# Patient Record
Sex: Female | Born: 1985 | Race: Black or African American | Hispanic: No | Marital: Married | Smoking: Former smoker
Health system: Southern US, Community
[De-identification: ages and names within clinical notes are randomized; demographics above are authoritative.]

## PROBLEM LIST (undated history)

## (undated) ENCOUNTER — Inpatient Hospital Stay (HOSPITAL_COMMUNITY): Payer: Self-pay

## (undated) ENCOUNTER — Emergency Department (HOSPITAL_COMMUNITY): Admission: EM | Payer: Self-pay | Source: Home / Self Care

## (undated) DIAGNOSIS — N898 Other specified noninflammatory disorders of vagina: Secondary | ICD-10-CM

## (undated) DIAGNOSIS — K5900 Constipation, unspecified: Secondary | ICD-10-CM

## (undated) DIAGNOSIS — N939 Abnormal uterine and vaginal bleeding, unspecified: Secondary | ICD-10-CM

## (undated) DIAGNOSIS — J329 Chronic sinusitis, unspecified: Secondary | ICD-10-CM

## (undated) DIAGNOSIS — F99 Mental disorder, not otherwise specified: Secondary | ICD-10-CM

## (undated) DIAGNOSIS — K219 Gastro-esophageal reflux disease without esophagitis: Secondary | ICD-10-CM

## (undated) DIAGNOSIS — D219 Benign neoplasm of connective and other soft tissue, unspecified: Secondary | ICD-10-CM

## (undated) DIAGNOSIS — E079 Disorder of thyroid, unspecified: Secondary | ICD-10-CM

## (undated) DIAGNOSIS — F32A Depression, unspecified: Secondary | ICD-10-CM

## (undated) DIAGNOSIS — A6000 Herpesviral infection of urogenital system, unspecified: Secondary | ICD-10-CM

## (undated) DIAGNOSIS — J45909 Unspecified asthma, uncomplicated: Secondary | ICD-10-CM

## (undated) DIAGNOSIS — F4310 Post-traumatic stress disorder, unspecified: Secondary | ICD-10-CM

## (undated) DIAGNOSIS — F329 Major depressive disorder, single episode, unspecified: Secondary | ICD-10-CM

## (undated) DIAGNOSIS — G4700 Insomnia, unspecified: Secondary | ICD-10-CM

## (undated) DIAGNOSIS — F419 Anxiety disorder, unspecified: Secondary | ICD-10-CM

## (undated) DIAGNOSIS — R1110 Vomiting, unspecified: Secondary | ICD-10-CM

## (undated) DIAGNOSIS — K589 Irritable bowel syndrome without diarrhea: Secondary | ICD-10-CM

## (undated) DIAGNOSIS — Z8619 Personal history of other infectious and parasitic diseases: Secondary | ICD-10-CM

## (undated) DIAGNOSIS — D649 Anemia, unspecified: Secondary | ICD-10-CM

## (undated) HISTORY — DX: Other specified noninflammatory disorders of vagina: N89.8

## (undated) HISTORY — DX: Insomnia, unspecified: G47.00

## (undated) HISTORY — DX: Major depressive disorder, single episode, unspecified: F32.9

## (undated) HISTORY — DX: Post-traumatic stress disorder, unspecified: F43.10

## (undated) HISTORY — DX: Depression, unspecified: F32.A

## (undated) HISTORY — DX: Anxiety disorder, unspecified: F41.9

## (undated) HISTORY — DX: Herpesviral infection of urogenital system, unspecified: A60.00

## (undated) HISTORY — DX: Irritable bowel syndrome, unspecified: K58.9

## (undated) HISTORY — DX: Disorder of thyroid, unspecified: E07.9

## (undated) HISTORY — DX: Personal history of other infectious and parasitic diseases: Z86.19

## (undated) HISTORY — PX: COLONOSCOPY: SHX174

## (undated) HISTORY — DX: Anemia, unspecified: D64.9

## (undated) HISTORY — PX: PERINEUM REPAIR: SHX2219

## (undated) HISTORY — DX: Abnormal uterine and vaginal bleeding, unspecified: N93.9

---

## 2005-10-21 ENCOUNTER — Emergency Department (HOSPITAL_COMMUNITY): Admission: EM | Admit: 2005-10-21 | Discharge: 2005-10-22 | Payer: Self-pay | Admitting: Emergency Medicine

## 2005-11-25 ENCOUNTER — Emergency Department (HOSPITAL_COMMUNITY): Admission: EM | Admit: 2005-11-25 | Discharge: 2005-11-25 | Payer: Self-pay | Admitting: Emergency Medicine

## 2013-04-12 ENCOUNTER — Emergency Department (HOSPITAL_COMMUNITY)
Admission: EM | Admit: 2013-04-12 | Discharge: 2013-04-12 | Disposition: A | Discharge status: Home / Self Care | Payer: Self-pay | Attending: Emergency Medicine | Admitting: Emergency Medicine

## 2013-04-12 ENCOUNTER — Encounter (HOSPITAL_COMMUNITY): Payer: Self-pay | Admitting: Emergency Medicine

## 2013-04-12 DIAGNOSIS — J069 Acute upper respiratory infection, unspecified: Secondary | ICD-10-CM | POA: Insufficient documentation

## 2013-04-12 DIAGNOSIS — B349 Viral infection, unspecified: Secondary | ICD-10-CM

## 2013-04-12 DIAGNOSIS — B9789 Other viral agents as the cause of diseases classified elsewhere: Secondary | ICD-10-CM | POA: Insufficient documentation

## 2013-04-12 HISTORY — DX: Chronic sinusitis, unspecified: J32.9

## 2013-04-12 MED ORDER — IBUPROFEN 800 MG PO TABS: 800.0000 mg | ORAL_TABLET | Freq: Three times a day (TID) | ORAL | Status: DC

## 2013-04-12 MED ORDER — PROMETHAZINE-DM 6.25-15 MG/5ML PO SYRP: 5.0000 mL | ORAL_SOLUTION | Freq: Four times a day (QID) | ORAL | Status: DC | PRN

## 2013-04-12 NOTE — ED Provider Notes (Signed)
CSN: [id number]     Arrival date & time 04/12/13  1709 History   First MD Initiated Contact with Patient 04/12/13 1759     This patient was seen in room APFT21/[facility]21 and the patient's care was started 6:00 PM.   Chief Complaint  Patient presents with  . Fever   Patient is a 27 y.o. female presenting with URI. The history is provided by the patient. No language interpreter was used.  URI Presenting symptoms: congestion, cough, facial pain, fatigue and fever   Presenting symptoms: no ear pain, no rhinorrhea and no sore throat   Congestion:    Location:  Nasal   Interferes with sleep: no     Interferes with eating/drinking: no   Cough:    Cough characteristics:  Productive and non-productive   Sputum characteristics:  Clear   Severity:  Mild   Onset quality:  Gradual   Duration:  3 days   Timing:  Intermittent   Progression:  Unchanged   Chronicity:  New Fever:    Duration:  1 day   Timing:  Unable to specify   Temp source:  Subjective   Progression:  Partially resolved Severity:  Mild Timing:  Intermittent Progression:  Improving Chronicity:  New Relieved by:  Nothing Worsened by:  Nothing tried Ineffective treatments:  OTC medications Associated symptoms: sinus pain   Associated symptoms: no headaches, no neck pain, no swollen glands and no wheezing       Past Medical History  Diagnosis Date  . Sinusitis    History reviewed. No pertinent past surgical history. History reviewed. No pertinent family history. History  Substance Use Topics  . Smoking status: Never Smoker   . Smokeless tobacco: Not on file  . Alcohol Use: No   OB History   Grav Para Term Preterm Abortions TAB SAB Ect Mult Living                 Review of Systems  Constitutional: Positive for fever and fatigue. Negative for chills, activity change and appetite change.  HENT: Positive for congestion. Negative for ear pain, facial swelling, rhinorrhea, sore throat and trouble swallowing.    Eyes: Negative for visual disturbance.  Respiratory: Positive for cough. Negative for chest tightness, shortness of breath, wheezing and stridor.   Gastrointestinal: Negative for nausea, vomiting and abdominal pain.  Genitourinary: Negative for dysuria.  Musculoskeletal: Negative for neck pain and neck stiffness.  Skin: Negative.  Negative for rash.  Neurological: Negative for dizziness, weakness, numbness and headaches.  Hematological: Negative for adenopathy.  Psychiatric/Behavioral: Negative for confusion.  All other systems reviewed and are negative.    Allergies  Review of patient's allergies indicates no known allergies.  Home Medications  No current outpatient prescriptions on file.  Triage Vitals: BP 113/86  Pulse 102  Temp(Src) 98.9 F (37.2 C) (Oral)  Resp 20  Ht 5\' 2"  (1.575 m)  Wt 135 lb (61.236 kg)  BMI 24.69 kg/m2  SpO2 100%  LMP 04/05/2013  Physical Exam  Nursing note and vitals reviewed. Constitutional: She is oriented to person, place, and time. She appears well-developed and well-nourished.  Non-toxic appearance. She does not appear ill. No distress.  HENT:  Head: Normocephalic and atraumatic.  Right Ear: Tympanic membrane, external ear and ear canal normal.  Left Ear: Tympanic membrane, external ear and ear canal normal.  Nose: Mucosal edema and rhinorrhea present.  Mouth/Throat: Uvula is midline, oropharynx is clear and moist and mucous membranes are normal. No trismus in the  jaw. No dental abscesses or uvula swelling. No oropharyngeal exudate, posterior oropharyngeal edema or tonsillar abscesses.  Eyes: Conjunctivae and EOM are normal. Pupils are equal, round, and reactive to light.  Neck: Normal range of motion, full passive range of motion without pain and phonation normal. Neck supple. No Brudzinski's sign and no Kernig's sign noted.  Cardiovascular: Normal rate, regular rhythm, normal heart sounds and intact distal pulses.  Exam reveals no gallop  and no friction rub.   No murmur heard. Pulmonary/Chest: Effort normal and breath sounds normal. No respiratory distress. She has no wheezes. She has no rhonchi. She has no rales. She exhibits no tenderness and no crepitus.  Abdominal: Soft. Normal appearance. She exhibits no distension. There is no tenderness. There is no rebound and no guarding.  Musculoskeletal: Normal range of motion. She exhibits no edema and no tenderness.  Moves all extremities well.   Lymphadenopathy:    She has no cervical adenopathy.  Neurological: She is alert and oriented to person, place, and time. She has normal strength. She exhibits normal muscle tone. Coordination normal.  Skin: Skin is warm, dry and intact. No rash noted. No erythema. No pallor.  Psychiatric: Her speech is normal. Her mood appears not anxious.    ED Course  Procedures (including critical care time)  DIAGNOSTIC STUDIES: Oxygen Saturation is 100% on RA, Normal by my interpretation.    COORDINATION OF CARE: 6:00 PM-Discussed treatment plan with pt at bedside and pt agreed to plan.      Labs Review Labs Reviewed - No data to display Imaging Review No results found.  EKG Interpretation   None       MDM    VSS.  Pt well appearing.  No meningeal signs.  Likely viral illness.  Pt agrees to symptomatic tx with ibuprofen and anti-tussive.  She appears stable for discharge.  Shuntae Herzig L. Trisha Mangle, PA-C 04/14/13 1153

## 2013-04-12 NOTE — ED Notes (Signed)
Cough, stuffy nose since Friday, Fever today. Nausea,

## 2013-04-15 NOTE — ED Provider Notes (Signed)
Medical screening examination/treatment/procedure(s) were performed by non-physician practitioner and as supervising physician I was immediately available for consultation/collaboration.  EKG Interpretation   None       Devoria Albe, MD, Armando Gang   Ward Givens, MD 04/15/13 [phone]

## 2013-05-04 ENCOUNTER — Emergency Department (HOSPITAL_COMMUNITY): Payer: Self-pay

## 2013-05-04 ENCOUNTER — Encounter (HOSPITAL_COMMUNITY): Payer: Self-pay | Admitting: Emergency Medicine

## 2013-05-04 ENCOUNTER — Emergency Department (HOSPITAL_COMMUNITY)
Admission: EM | Admit: 2013-05-04 | Discharge: 2013-05-04 | Disposition: A | Discharge status: Home / Self Care | Payer: Self-pay | Attending: Emergency Medicine | Admitting: Emergency Medicine

## 2013-05-04 DIAGNOSIS — R748 Abnormal levels of other serum enzymes: Secondary | ICD-10-CM | POA: Insufficient documentation

## 2013-05-04 DIAGNOSIS — A088 Other specified intestinal infections: Secondary | ICD-10-CM | POA: Insufficient documentation

## 2013-05-04 DIAGNOSIS — Z8709 Personal history of other diseases of the respiratory system: Secondary | ICD-10-CM | POA: Insufficient documentation

## 2013-05-04 DIAGNOSIS — Z791 Long term (current) use of non-steroidal anti-inflammatories (NSAID): Secondary | ICD-10-CM | POA: Insufficient documentation

## 2013-05-04 DIAGNOSIS — R112 Nausea with vomiting, unspecified: Secondary | ICD-10-CM | POA: Insufficient documentation

## 2013-05-04 DIAGNOSIS — A084 Viral intestinal infection, unspecified: Secondary | ICD-10-CM

## 2013-05-04 DIAGNOSIS — Z3202 Encounter for pregnancy test, result negative: Secondary | ICD-10-CM | POA: Insufficient documentation

## 2013-05-04 LAB — URINE MICROSCOPIC-ADD ON

## 2013-05-04 LAB — COMPREHENSIVE METABOLIC PANEL
ALT: 28 U/L (ref 0–35)
AST: 22 U/L (ref 0–37)
Albumin: 4.1 g/dL (ref 3.5–5.2)
Alkaline Phosphatase: 130 U/L — ABNORMAL HIGH (ref 39–117)
BUN: 9 mg/dL (ref 6–23)
CO2: 26 mEq/L (ref 19–32)
Calcium: 9.3 mg/dL (ref 8.4–10.5)
Chloride: 100 mEq/L (ref 96–112)
Creatinine, Ser: 0.74 mg/dL (ref 0.50–1.10)
GFR calc Af Amer: >90 mL/min (ref >90)
GFR calc non Af Amer: >90 mL/min (ref >90)
Glucose, Bld: 99 mg/dL (ref 70–99)
Potassium: 3.5 mEq/L (ref 3.5–5.1)
Sodium: 137 mEq/L (ref 135–145)
Total Bilirubin: 0.6 mg/dL (ref 0.3–1.2)
Total Protein: 8.3 g/dL (ref 6.0–8.3)

## 2013-05-04 LAB — CBC
HCT: 38.4 % (ref 36.0–46.0)
MCHC: 31.8 g/dL (ref 30.0–36.0)
Platelets: 227 10*3/uL (ref 150–400)
RBC: 5.27 MIL/uL — ABNORMAL HIGH (ref 3.87–5.11)
RDW: 16 % — ABNORMAL HIGH (ref 11.5–15.5)
WBC: 2.8 10*3/uL — ABNORMAL LOW (ref 4.0–10.5)

## 2013-05-04 LAB — URINALYSIS, ROUTINE W REFLEX MICROSCOPIC
Leukocytes, UA: NEGATIVE
Nitrite: NEGATIVE
Protein, ur: NEGATIVE mg/dL
Specific Gravity, Urine: >1.03 — ABNORMAL HIGH (ref 1.005–1.030)
Urobilinogen, UA: 1 mg/dL (ref 0.0–1.0)
pH: 6 (ref 5.0–8.0)

## 2013-05-04 LAB — LIPASE, BLOOD: Lipase: 26 U/L (ref 11–59)

## 2013-05-04 LAB — POCT PREGNANCY, URINE: Preg Test, Ur: NEGATIVE

## 2013-05-04 MED ORDER — ONDANSETRON HCL 4 MG/2ML IJ SOLN
4.0000 mg | Freq: Once | INTRAMUSCULAR | Status: AC
  Administered 2013-05-04: 4 mg via INTRAVENOUS
  Filled 2013-05-04: qty 2

## 2013-05-04 MED ORDER — SODIUM CHLORIDE 0.9 % IV BOLUS (SEPSIS)
500.0000 mL | Freq: Once | INTRAVENOUS | Status: AC
  Administered 2013-05-04: 10:00:00 via INTRAVENOUS

## 2013-05-04 NOTE — ED Notes (Signed)
Pt given sprite to drink. Resting in bed, no further vomiting noted.

## 2013-05-04 NOTE — ED Provider Notes (Signed)
Medical screening examination/treatment/procedure(s) were performed by non-physician practitioner and as supervising physician I was immediately available for consultation/collaboration.  EKG Interpretation   None         Layla Maw Kenisha Lynds, DO 05/04/13 1404

## 2013-05-04 NOTE — ED Notes (Signed)
Pt reports nausea, vomiting and diarrhea since yesterday. Denies any fever or chills. No ab pain.

## 2013-05-04 NOTE — ED Provider Notes (Signed)
CSN: [id number]     Arrival date & time 05/04/13  4098 History   First MD Initiated Contact with Patient 05/04/13 [phone]     Chief Complaint  Patient presents with  . Emesis   (Consider location/radiation/quality/duration/timing/severity/associated sxs/prior Treatment) HPI Comments: Pt is a 27 y/o healthy female who presents to the ED complaining of nausea, vomiting and diarrhea x 1 day. Pt states shortly after eating breakfast yesterday morning (pancakes and eggs) her symptoms began. States she has had multiple episodes of nonbloody, nonbilious vomiting along with nonbloody, non-watery diarrhea every 2 hours. Yesterday for dinner she had meatballs and beans, her daughter ate the same and is feeling fine. Her daughter did not have pancakes and eggs yesterday morning. 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 Denies abdominal pain, fever, chills, increased urinary frequency, urgency or dysuria, vaginal discharge. She is currently on her menses which began two days ago.  Patient is a 27 y.o. female presenting with vomiting. The history is provided by the patient.  Emesis Associated symptoms: diarrhea     Past Medical History  Diagnosis Date  . Sinusitis    History reviewed. No pertinent past surgical history. No family history on file. History  Substance Use Topics  . Smoking status: Never Smoker   . Smokeless tobacco: Not on file  . Alcohol Use: No   OB History   Grav Para Term Preterm Abortions TAB SAB Ect Mult Living                 Review of Systems  Gastrointestinal: Positive for nausea, vomiting and diarrhea.  All other systems reviewed and are negative.    Allergies  Bee venom  Home Medications   Current Outpatient Rx  Name  Route  Sig  Dispense  Refill  . ibuprofen (ADVIL,MOTRIN) 800 MG tablet   Oral   Take 1 tablet (800 mg total) by mouth 3 (three) times daily.   21 tablet   0   .  promethazine-dextromethorphan (PROMETHAZINE-DM) 6.25-15 MG/5ML syrup   Oral   Take 5 mLs by mouth 4 (four) times daily as needed for cough.   120 mL   0   . Pseudoephedrine-APAP-DM 30-325-15 MG/15ML LIQD   Oral   Take 30 mLs by mouth every 4 (four) hours as needed (for cold and cough).          BP 112/79  Pulse 76  Temp(Src) 98 F (36.7 C) (Oral)  Resp 16  SpO2 98%  LMP 05/02/2013 Physical Exam  Nursing note and vitals reviewed. Constitutional: She is oriented to person, place, and time. She appears well-developed and well-nourished. No distress.  HENT:  Head: Normocephalic and atraumatic.  Mouth/Throat: Oropharynx is clear and moist.  Eyes: Conjunctivae are normal. No scleral icterus.  Neck: Normal range of motion. Neck supple.  Cardiovascular: Normal rate, regular rhythm and normal heart sounds.   Pulmonary/Chest: Effort normal and breath sounds normal.  Abdominal: Soft. Normal appearance and bowel sounds are normal. She exhibits no distension. There is generalized tenderness (worse mid-epigastric and RUQ). There is no rigidity, no rebound, no guarding and negative Murphy's sign.  No peritoneal signs.  Musculoskeletal: Normal range of motion. She exhibits no edema.  Neurological: She is alert and oriented to person, place, and time.  Skin: Skin is warm and dry. She is not diaphoretic.  Psychiatric: She has a normal mood and affect. Her behavior is normal.    ED Course  Procedures (including critical care time) Labs Review Labs Reviewed  CBC - Abnormal; Notable for the following:    WBC 2.8 (*)    RBC 5.27 (*)    MCV 72.9 (*)    MCH 23.1 (*)    RDW 16.0 (*)    All other components within normal limits  COMPREHENSIVE METABOLIC PANEL - Abnormal; Notable for the following:    Alkaline Phosphatase 130 (*)    All other components within normal limits  URINALYSIS, ROUTINE W REFLEX MICROSCOPIC - Abnormal; Notable for the following:    Specific Gravity, Urine >1.030 (*)     Hgb urine dipstick MODERATE (*)    Ketones, ur TRACE (*)    All other components within normal limits  URINE MICROSCOPIC-ADD ON - Abnormal; Notable for the following:    Squamous Epithelial / LPF FEW (*)    All other components within normal limits  LIPASE, BLOOD  POCT PREGNANCY, URINE   Imaging Review US Abdomen Complete  05/04/2013   CLINICAL DATA:  Emesis  EXAM: ULTRASOUND ABDOMEN COMPLETE  COMPARISON:  None.  FINDINGS: Gallbladder:  No gallstones or wall thickening visualized. No sonographic Murphy sign noted.  Common bile duct:  Diameter: 1.5 mm.  Liver:  No focal lesion identified. Within normal limits in parenchymal echogenicity.  IVC:  No abnormality visualized.  Pancreas:  Visualized portion unremarkable.  Spleen:  Size and appearance within normal limits.  Right Kidney:  Length: 1.1 cm. Echogenicity within normal limits. No mass or hydronephrosis visualized.  Left Kidney:  Length: 11.2 cm. Echogenicity within normal limits. No mass or hydronephrosis visualized.  Abdominal aorta:  No aneurysm visualized.  Other findings:  None.  IMPRESSION: Normal abdominal ultrasound.   Electronically Signed   By: Elige Ko   On: 05/04/2013 10:37    EKG Interpretation   None       MDM   1. Viral gastroenteritis   2. Nausea and vomiting   3. Elevated alkaline phosphatase level     Pt presenting with n/v/d. She is well appearing and in NAD, afebrile with normal VS. Generalized abdominal tenderness, more so mid-epigastric, RUQ. Labs pending- cbc, cmp, lipase, ua, preg. IV fluids, zofran. Plan to give PO challenge, probable viral gastroenteritis.  Alk-phos elevated at 130, no old to compare. Given RUQ and mid-epigastric tenderness, will obtain abdominal US to further evaluate gallbladder. 11:03 AM Ultrasound normal. She reports she is feeling much better after receiving fluids and Zofran. Tolerating PO in the emergency department. Regarding leukopenia and elevated alkaline phosphatase,  advised her to followup with PCP for repeat levels. She is stable for discharge. Return precautions given. Patient states understanding of plan and is agreeable.  Charlotte Mace, PA-C 05/04/13 1104

## 2013-05-04 NOTE — ED Notes (Signed)
N/v/d starting yesterday.  Denies abd pain.

## 2013-05-26 ENCOUNTER — Emergency Department (HOSPITAL_COMMUNITY)
Admission: EM | Admit: 2013-05-26 | Discharge: 2013-05-26 | Disposition: A | Discharge status: Home / Self Care | Payer: Self-pay | Attending: Emergency Medicine | Admitting: Emergency Medicine

## 2013-05-26 ENCOUNTER — Encounter (HOSPITAL_COMMUNITY): Payer: Self-pay | Admitting: Emergency Medicine

## 2013-05-26 DIAGNOSIS — Z792 Long term (current) use of antibiotics: Secondary | ICD-10-CM | POA: Insufficient documentation

## 2013-05-26 DIAGNOSIS — K0889 Other specified disorders of teeth and supporting structures: Secondary | ICD-10-CM

## 2013-05-26 DIAGNOSIS — K089 Disorder of teeth and supporting structures, unspecified: Secondary | ICD-10-CM | POA: Insufficient documentation

## 2013-05-26 DIAGNOSIS — Z8709 Personal history of other diseases of the respiratory system: Secondary | ICD-10-CM | POA: Insufficient documentation

## 2013-05-26 MED ORDER — PENICILLIN V POTASSIUM 250 MG PO TABS
500.0000 mg | ORAL_TABLET | Freq: Once | ORAL | Status: AC
  Administered 2013-05-26: 500 mg via ORAL
  Filled 2013-05-26: qty 2

## 2013-05-26 MED ORDER — IBUPROFEN 800 MG PO TABS
800.0000 mg | ORAL_TABLET | Freq: Once | ORAL | Status: AC
  Administered 2013-05-26: 800 mg via ORAL
  Filled 2013-05-26: qty 1

## 2013-05-26 MED ORDER — HYDROCODONE-ACETAMINOPHEN 5-325 MG PO TABS
2.0000 | ORAL_TABLET | Freq: Once | ORAL | Status: AC
  Administered 2013-05-26: 2 via ORAL
  Filled 2013-05-26: qty 2

## 2013-05-26 MED ORDER — HYDROCODONE-ACETAMINOPHEN 7.5-325 MG PO TABS: 1.0000 | ORAL_TABLET | ORAL | Status: DC | PRN

## 2013-05-26 MED ORDER — AMOXICILLIN 500 MG PO CAPS: ORAL_CAPSULE | ORAL | Status: DC

## 2013-05-26 NOTE — ED Notes (Signed)
Pt c/o toothache x 1 day.

## 2013-05-26 NOTE — ED Provider Notes (Signed)
CSN: [id number]     Arrival date & time 05/26/13  1323 History   First MD Initiated Contact with Patient 05/26/13 1507     Chief Complaint  Patient presents with  . Dental Pain   (Consider location/radiation/quality/duration/timing/severity/associated sxs/prior Treatment) Patient is a 27 y.o. female presenting with tooth pain. The history is provided by the patient.  Dental Pain Location:  Upper Upper teeth location:  2/RU 2nd molar and 15/LU 2nd molar Quality:  Pulsating Severity:  Severe Onset quality:  Gradual Duration:  1 day Timing:  Intermittent Progression:  Worsening Chronicity:  New Context: dental caries and poor dentition   Relieved by:  Nothing Worsened by:  Nothing tried Ineffective treatments:  Acetaminophen Associated symptoms: gum swelling   Associated symptoms: no difficulty swallowing and no neck pain   Risk factors: no diabetes and no smoking     Past Medical History  Diagnosis Date  . Sinusitis    History reviewed. No pertinent past surgical history. No family history on file. History  Substance Use Topics  . Smoking status: Never Smoker   . Smokeless tobacco: Not on file  . Alcohol Use: No   OB History   Grav Para Term Preterm Abortions TAB SAB Ect Mult Living                 Review of Systems  Constitutional: Negative for activity change.       All ROS Neg except as noted in HPI  HENT: Positive for dental problem. Negative for nosebleeds.   Eyes: Negative for photophobia and discharge.  Respiratory: Negative for cough, shortness of breath and wheezing.   Cardiovascular: Negative for chest pain and palpitations.  Gastrointestinal: Negative for abdominal pain and blood in stool.  Genitourinary: Negative for dysuria, frequency and hematuria.  Musculoskeletal: Negative for arthralgias, back pain and neck pain.  Skin: Negative.   Neurological: Negative for dizziness, seizures and speech difficulty.  Psychiatric/Behavioral: Negative for  hallucinations and confusion.    Allergies  Bee venom  Home Medications   Current Outpatient Rx  Name  Route  Sig  Dispense  Refill  . acetaminophen (TYLENOL) 500 MG tablet   Oral   Take 500 mg by mouth every 6 (six) hours as needed.         Marland Kitchen amoxicillin (AMOXIL) 500 MG capsule      2 tabs po bid with food   28 capsule   0   . HYDROcodone-acetaminophen (NORCO) 7.5-325 MG per tablet   Oral   Take 1 tablet by mouth every 4 (four) hours as needed for moderate pain.   20 tablet   0    BP 112/61  Pulse 84  Temp(Src) 98.1 F (36.7 C) (Oral)  Resp 18  Ht 5\' 2"  (1.575 m)  Wt 130 lb (58.968 kg)  BMI 23.77 kg/m2  SpO2 100%  LMP 05/22/2013 Physical Exam  Nursing note and vitals reviewed. Constitutional: She is oriented to person, place, and time. She appears well-developed and well-nourished.  Non-toxic appearance.  HENT:  Head: Normocephalic.  Right Ear: Tympanic membrane and external ear normal.  Left Ear: Tympanic membrane and external ear normal.  There is a alongside the left upper second molar. Question abnormal tooth presentation versus early abscess. There is swelling around the gum in this area. There is a cavity in this area. There is a deep cavity of the right upper second molar. No abscess appreciated there. The airway is patent. There is no swelling under the tongue.  Eyes: EOM and lids are normal. Pupils are equal, round, and reactive to light.  Neck: Normal range of motion. Neck supple. Carotid bruit is not present.  Cardiovascular: Normal rate, regular rhythm, normal heart sounds, intact distal pulses and normal pulses.   Pulmonary/Chest: Breath sounds normal. No respiratory distress.  Abdominal: Soft. Bowel sounds are normal. There is no tenderness. There is no guarding.  Musculoskeletal: Normal range of motion.  Lymphadenopathy:       Head (right side): No submandibular adenopathy present.       Head (left side): No submandibular adenopathy present.     She has no cervical adenopathy.  Neurological: She is alert and oriented to person, place, and time. She has normal strength. No cranial nerve deficit or sensory deficit.  Skin: Skin is warm and dry.  Psychiatric: She has a normal mood and affect. Her speech is normal.    ED Course  Procedures (including critical care time) Labs Review Labs Reviewed - No data to display Imaging Review No results found.  EKG Interpretation   None       MDM   1. Pain, dental    **I have reviewed nursing notes, vital signs, and all appropriate lab and imaging results for this patient.*  Rx for amoxil and norco given to the patient. Dental resources given to the patient.   Kathie Dike, PA-C 05/28/13 [phone]

## 2013-05-26 NOTE — ED Notes (Signed)
Pt verbalized understanding of d/c instructions, pt received list of dental services in the area as well, pt verbalized understanding of no driving after taking Vicodin due to drowsiness

## 2013-05-29 NOTE — ED Provider Notes (Signed)
Medical screening examination/treatment/procedure(s) were performed by non-physician practitioner and as supervising physician I was immediately available for consultation/collaboration.  EKG Interpretation   None        Donnetta Hutching, MD 05/29/13 [phone]

## 2014-06-03 HISTORY — PX: UPPER GASTROINTESTINAL ENDOSCOPY: SHX188

## 2015-04-28 ENCOUNTER — Emergency Department (HOSPITAL_COMMUNITY)
Admission: EM | Admit: 2015-04-28 | Discharge: 2015-04-28 | Disposition: A | Discharge status: Home / Self Care | Payer: Self-pay | Attending: Emergency Medicine | Admitting: Emergency Medicine

## 2015-04-28 ENCOUNTER — Encounter (HOSPITAL_COMMUNITY): Payer: Self-pay | Admitting: *Deleted

## 2015-04-28 DIAGNOSIS — Z8709 Personal history of other diseases of the respiratory system: Secondary | ICD-10-CM | POA: Insufficient documentation

## 2015-04-28 DIAGNOSIS — Z3202 Encounter for pregnancy test, result negative: Secondary | ICD-10-CM | POA: Insufficient documentation

## 2015-04-28 DIAGNOSIS — M62830 Muscle spasm of back: Secondary | ICD-10-CM | POA: Insufficient documentation

## 2015-04-28 LAB — POC URINE PREG, ED: Preg Test, Ur: NEGATIVE

## 2015-04-28 MED ORDER — METHOCARBAMOL 500 MG PO TABS: 500.0000 mg | ORAL_TABLET | Freq: Three times a day (TID) | ORAL | Status: DC | PRN

## 2015-04-28 MED ORDER — KETOROLAC TROMETHAMINE 30 MG/ML IJ SOLN
60.0000 mg | Freq: Once | INTRAMUSCULAR | Status: AC
  Administered 2015-04-28: 60 mg via INTRAMUSCULAR
  Filled 2015-04-28: qty 2

## 2015-04-28 MED ORDER — HYDROMORPHONE HCL 1 MG/ML IJ SOLN
1.0000 mg | Freq: Once | INTRAMUSCULAR | Status: AC
  Administered 2015-04-28: 1 mg via INTRAMUSCULAR
  Filled 2015-04-28: qty 1

## 2015-04-28 MED ORDER — ONDANSETRON 4 MG PO TBDP
4.0000 mg | ORAL_TABLET | Freq: Once | ORAL | Status: AC
  Administered 2015-04-28: 4 mg via ORAL
  Filled 2015-04-28: qty 1

## 2015-04-28 MED ORDER — ONDANSETRON 4 MG PO TBDP: 4.0000 mg | ORAL_TABLET | Freq: Three times a day (TID) | ORAL | Status: DC | PRN

## 2015-04-28 MED ORDER — IBUPROFEN 800 MG PO TABS: 800.0000 mg | ORAL_TABLET | Freq: Three times a day (TID) | ORAL | Status: DC | PRN

## 2015-04-28 MED ORDER — METHOCARBAMOL 500 MG PO TABS
500.0000 mg | ORAL_TABLET | Freq: Once | ORAL | Status: AC
  Administered 2015-04-28: 500 mg via ORAL
  Filled 2015-04-28: qty 1

## 2015-04-28 MED ORDER — OXYCODONE-ACETAMINOPHEN 5-325 MG PO TABS: 1.0000 | ORAL_TABLET | ORAL | Status: DC | PRN

## 2015-04-28 NOTE — ED Notes (Signed)
Pt reporting pain that starts in lower back and radiates up along spine.  Reports minor pain when going to bed, increased to sharp pain this morning.  No known injury.

## 2015-04-28 NOTE — Discharge Instructions (Signed)
Heat Therapy Heat therapy can help ease sore, stiff, injured, and tight muscles and joints. Heat relaxes your muscles, which may help ease your pain.  RISKS AND COMPLICATIONS If you have any of the following conditions, do not use heat therapy unless your health care provider has approved:  Poor circulation.  Healing wounds or scarred skin in the area being treated.  Diabetes, heart disease, or high blood pressure.  Not being able to feel (numbness) the area being treated.  Unusual swelling of the area being treated.  Active infections.  Blood clots.  Cancer.  Inability to communicate pain. This may include young children and people who have problems with their brain function (dementia).  Pregnancy. Heat therapy should only be used on old, pre-existing, or long-lasting (chronic) injuries. Do not use heat therapy on new injuries unless directed by your health care provider. HOW TO USE HEAT THERAPY There are several different kinds of heat therapy, including:  Moist heat pack.  Warm water bath.  Hot water bottle.  Electric heating pad.  Heated gel pack.  Heated wrap.  Electric heating pad. Use the heat therapy method suggested by your health care provider. Follow your health care provider's instructions on when and how to use heat therapy. GENERAL HEAT THERAPY RECOMMENDATIONS  Do not sleep while using heat therapy. Only use heat therapy while you are awake.  Your skin may turn pink while using heat therapy. Do not use heat therapy if your skin turns red.  Do not use heat therapy if you have new pain.  High heat or long exposure to heat can cause burns. Be careful when using heat therapy to avoid burning your skin.  Do not use heat therapy on areas of your skin that are already irritated, such as with a rash or sunburn. SEEK MEDICAL CARE IF:  You have blisters, redness, swelling, or numbness.  You have new pain.  Your pain is worse. MAKE SURE  YOU:  Understand these instructions.  Will watch your condition.  Will get help right away if you are not doing well or get worse.   This information is not intended to replace advice given to you by your health care provider. Make sure you discuss any questions you have with your health care provider.   Document Released: 08/12/2011 Document Revised: 06/10/2014 Document Reviewed: 07/13/2013 Elsevier Interactive Patient Education 2016 [employer] Lumbosacral Strain Lumbosacral strain is a strain of any of the parts that make up your lumbosacral vertebrae. Your lumbosacral vertebrae are the bones that make up the lower third of your backbone. Your lumbosacral vertebrae are held together by muscles and tough, fibrous tissue (ligaments).  CAUSES  A sudden blow to your back can cause lumbosacral strain. Also, anything that causes an excessive stretch of the muscles in the low back can cause this strain. This is typically seen when people exert themselves strenuously, fall, lift heavy objects, bend, or crouch repeatedly. RISK FACTORS  Physically demanding work.  Participation in pushing or pulling sports or sports that require a sudden twist of the back (tennis, golf, baseball).  Weight lifting.  Excessive lower back curvature.  Forward-tilted pelvis.  Weak back or abdominal muscles or both.  Tight hamstrings. SIGNS AND SYMPTOMS  Lumbosacral strain may cause pain in the area of your injury or pain that moves (radiates) down your leg.  DIAGNOSIS Your health care provider can often diagnose lumbosacral strain through a physical exam. In some cases, you may need tests such as X-ray exams.  TREATMENT  Treatment for your lower back injury depends on many factors that your clinician will have to evaluate. However, most treatment will include the use of anti-inflammatory medicines. HOME CARE INSTRUCTIONS   Avoid hard physical activities (tennis, racquetball, waterskiing) if you are not  in proper physical condition for it. This may aggravate or create problems.  If you have a back problem, avoid sports requiring sudden body movements. Swimming and walking are generally safer activities.  Maintain good posture.  Maintain a healthy weight.  For acute conditions, you may put ice on the injured area.  Put ice in a plastic bag.  Place a towel between your skin and the bag.  Leave the ice on for 20 minutes, 2-3 times a day.  When the low back starts healing, stretching and strengthening exercises may be recommended. SEEK MEDICAL CARE IF:  Your back pain is getting worse.  You experience severe back pain not relieved with medicines. SEEK IMMEDIATE MEDICAL CARE IF:   You have numbness, tingling, weakness, or problems with the use of your arms or legs.  There is a change in bowel or bladder control.  You have increasing pain in any area of the body, including your belly (abdomen).  You notice shortness of breath, dizziness, or feel faint.  You feel sick to your stomach (nauseous), are throwing up (vomiting), or become sweaty.  You notice discoloration of your toes or legs, or your feet get very cold. MAKE SURE YOU:   Understand these instructions.  Will watch your condition.  Will get help right away if you are not doing well or get worse.   This information is not intended to replace advice given to you by your health care provider. Make sure you discuss any questions you have with your health care provider.   Document Released: 02/27/2005 Document Revised: 06/10/2014 Document Reviewed: 01/06/2013 Elsevier Interactive Patient Education [employer].

## 2015-04-28 NOTE — ED Provider Notes (Signed)
TIME SEEN: 5:15 AM  CHIEF COMPLAINT: Back pain  HPI: Pt is a 29 y.o. female with no significant past medical history who presents with back pain that started yesterday. States she noticed a sharp pain in her lower back that increased when she woke up this morning and tried to get out of bed. No known injury. 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 Pain worse with movement and palpation. Denies numbness, tingling or focal weakness. No bowel or bladder incontinence. No urinary retention. No fever. No history of chronic back pain, epidural injections, spinal surgery. No weight loss or history of cancer.  ROS: See HPI Constitutional: no fever  Eyes: no drainage  ENT: no runny nose   Cardiovascular:  no chest pain  Resp: no SOB  GI: no vomiting GU: no dysuria Integumentary: no rash  Allergy: no hives  Musculoskeletal: no leg swelling  Neurological: no slurred speech ROS otherwise negative  PAST MEDICAL HISTORY/PAST SURGICAL HISTORY:  Past Medical History  Diagnosis Date  . Sinusitis     MEDICATIONS:  Prior to Admission medications   Medication Sig Start Date End Date Taking? Authorizing Provider  acetaminophen (TYLENOL) 500 MG tablet Take 500 mg by mouth every 6 (six) hours as needed.    Historical Provider, MD  amoxicillin (AMOXIL) 500 MG capsule 2 tabs po bid with food 05/26/13   Lily Kocher, PA-C  HYDROcodone-acetaminophen (NORCO) 7.5-325 MG per tablet Take 1 tablet by mouth every 4 (four) hours as needed for moderate pain. 05/26/13   Lily Kocher, PA-C    ALLERGIES:  Allergies  Allergen Reactions  . Bee Venom Swelling    SOCIAL HISTORY:  Social History  Substance Use Topics  . Smoking status: Never Smoker   . Smokeless tobacco: Not on file  . Alcohol Use: No    FAMILY HISTORY: History reviewed. No pertinent family history.  EXAM: BP 113/79 mmHg  Pulse 110  Temp(Src) 98.1 F (36.7 C) (Oral)  Resp 18  Ht 5' 1.5" (1.562 m)  Wt 145 lb (65.772 kg)  BMI 26.96 kg/m2   SpO2 100%  LMP 04/27/2015 CONSTITUTIONAL: Alert and oriented and responds appropriately to questions. Appears uncomfortable, tearful but afebrile and nontoxic HEAD: Normocephalic EYES: Conjunctivae clear, PERRL ENT: normal nose; no rhinorrhea; moist mucous membranes; pharynx without lesions noted NECK: Supple, no meningismus, no LAD  CARD: Regular rate, tachycardic; S1 and S2 appreciated; no murmurs, no clicks, no rubs, no gallops RESP: Normal chest excursion without splinting or tachypnea; breath sounds clear and equal bilaterally; no wheezes, no rhonchi, no rales, no hypoxia or respiratory distress, speaking full sentences ABD/GI: Normal bowel sounds; non-distended; soft, non-tender, no rebound, no guarding, no peritoneal signs BACK:  The back appears normal and is tender over the thoracic and lumbar paraspinal musculature bilaterally with associated muscle spasm, no midline spinal tenderness or step-off or deformity, there is no CVA tenderness EXT: Normal ROM in all joints; non-tender to palpation; no edema; normal capillary refill; no cyanosis, no calf tenderness or swelling    SKIN: Normal color for age and race; warm NEURO: Moves all extremities equally, sensation to light touch intact diffusely, cranial nerves II through XII intact, no saddle anesthesia, 2+ deep tendon reflexes in bilateral upper and lower extremity is, no clonus PSYCH: The patient's mood and manner are appropriate. Grooming and personal hygiene are appropriate.  MEDICAL DECISION MAKING: Patient here with back pain. Suspect muscle spasm. No injury to suggest fracture. No red flag symptoms, fever, neurologic deficits to suggest cauda  equina, spinal stenosis, discitis, transverse myelitis, epidural abscess or hematoma. I do not feel she needs emergent back imaging. Patient is to walk secondary to pain. She does have good strength in her legs however. Will give Dilaudid, Toradol and Robaxin and reassess. We'll obtain urine  pregnancy test.  ED PROGRESS: Urine pregnancy is negative. Patient reports her pain is markedly improved and she is not able to ambulate. We'll discharge home with Percocet, ibuprofen and Robaxin. We'll provide her with outpatient follow-up. Discussed return precautions. Patient comfortable with this plan. Her neighbor is here to take her home.     [place], DO 04/28/15 [phone]

## 2015-10-06 ENCOUNTER — Emergency Department (HOSPITAL_COMMUNITY)
Admission: EM | Admit: 2015-10-06 | Discharge: 2015-10-06 | Disposition: A | Discharge status: Home / Self Care | Payer: 59 | Attending: Emergency Medicine | Admitting: Emergency Medicine

## 2015-10-06 ENCOUNTER — Encounter (HOSPITAL_COMMUNITY): Payer: Self-pay

## 2015-10-06 DIAGNOSIS — H00012 Hordeolum externum right lower eyelid: Secondary | ICD-10-CM | POA: Insufficient documentation

## 2015-10-06 DIAGNOSIS — H00013 Hordeolum externum right eye, unspecified eyelid: Secondary | ICD-10-CM

## 2015-10-06 DIAGNOSIS — H02843 Edema of right eye, unspecified eyelid: Secondary | ICD-10-CM | POA: Diagnosis present

## 2015-10-06 MED ORDER — CETIRIZINE HCL 10 MG PO CAPS: 10.0000 mg | ORAL_CAPSULE | Freq: Every day | ORAL | Status: DC

## 2015-10-06 NOTE — ED Provider Notes (Signed)
TIME SEEN: 5:55 AM  CHIEF COMPLAINT: Right lower eyelid swelling  HPI: Pt is a 30 y.o. female with no significant past medical history who wears glasses who presents the emergency department with right lower eyelid swelling that started this morning. Area is itchy and slightly uncomfortable. No eye pain, vision changes, tearing or discharge. No injury to the eye. No history of surgery to the eye. No headache, vomiting, fever.  ROS: See HPI Constitutional: no fever  Eyes: no drainage  ENT: no runny nose   Cardiovascular:  no chest pain  Resp: no SOB  GI: no vomiting GU: no dysuria Integumentary: no rash  Allergy: no hives  Musculoskeletal: no leg swelling  Neurological: no slurred speech ROS otherwise negative  PAST MEDICAL HISTORY/PAST SURGICAL HISTORY:  Past Medical History  Diagnosis Date  . Sinusitis     MEDICATIONS:  Prior to Admission medications   Medication Sig Start Date End Date Taking? Authorizing Provider  acetaminophen (TYLENOL) 500 MG tablet Take 500 mg by mouth every 6 (six) hours as needed.    Historical Provider, MD  ibuprofen (ADVIL,MOTRIN) 800 MG tablet Take 1 tablet (800 mg total) by mouth every 8 (eight) hours as needed for mild pain. 04/28/15   Harnoor Kohles N Lisette Mancebo, DO  methocarbamol (ROBAXIN) 500 MG tablet Take 1 tablet (500 mg total) by mouth every 8 (eight) hours as needed for muscle spasms. 04/28/15   Johney Perotti N Mccayla Shimada, DO  ondansetron (ZOFRAN ODT) 4 MG disintegrating tablet Take 1 tablet (4 mg total) by mouth every 8 (eight) hours as needed for nausea or vomiting. 04/28/15   Minnette Merida N Josephine Wooldridge, DO  oxyCODONE-acetaminophen (PERCOCET/ROXICET) 5-325 MG tablet Take 1 tablet by mouth every 4 (four) hours as needed. 04/28/15   Aryahi Denzler N Alford Gamero, DO    ALLERGIES:  Allergies  Allergen Reactions  . Bee Venom Swelling    SOCIAL HISTORY:  Social History  Substance Use Topics  . Smoking status: Never Smoker   . Smokeless tobacco: Not on file  . Alcohol Use: No     FAMILY HISTORY: No family history on file.  EXAM: BP 127/68 mmHg  Pulse 84  Temp(Src) 98 F (36.7 C) (Oral)  Resp 14  Ht 5\' 2"  (1.575 m)  Wt 140 lb (63.504 kg)  BMI 25.60 kg/m2  SpO2 100% CONSTITUTIONAL: Alert and oriented and responds appropriately to questions. Well-appearing; well-nourished HEAD: Normocephalic EYES: Conjunctivae clear, PERRL, Extraocular movements intact, no pain with consensual light response, normal visual fields and visual acuity, no subconjunctival hemorrhage, no drainage, patient has a stye to the right lower eyelid with some dependent edema, no ecchymosis  ENT: normal nose; no rhinorrhea; moist mucous membranes NECK: Supple, no meningismus, no LAD  CARD: RRR; S1 and S2 appreciated; no murmurs, no clicks, no rubs, no gallops RESP: Normal chest excursion without splinting or tachypnea; breath sounds clear and equal bilaterally; no wheezes, no rhonchi, no rales, no hypoxia or respiratory distress, speaking full sentences ABD/GI: Normal bowel sounds; non-distended; soft, non-tender, no rebound, no guarding, no peritoneal signs BACK:  The back appears normal and is non-tender to palpation, there is no CVA tenderness EXT: Normal ROM in all joints; non-tender to palpation; no edema; normal capillary refill; no cyanosis, no calf tenderness or swelling    SKIN: Normal color for age and race; warm; no rash NEURO: Moves all extremities equally, sensation to light touch intact diffusely, cranial nerves II through XII intact PSYCH: The patient's mood and manner are appropriate. Grooming and personal hygiene are  appropriate.  MEDICAL DECISION MAKING: Patient here with a stye to the right lower lid. Have advised her to apply warm compresses. She is concerned that this could be allergies. States she is taking Benadryl at home without relief. Asking for prescription for Zyrtec. Discussed with her that I think this is less likely allergies given the other eye is not affected.  No rash, hives, angioedema. No vision changes, eye pain. No injury to the eye. Nothing to suggest corneal abrasion, corneal ulcer, glaucoma. I feel she is safe to go home and follow-up with her primary care physician. Discussed return precautions.   At this time, I do not feel there is any life-threatening condition present. I have reviewed and discussed all results (EKG, imaging, lab, urine as appropriate), exam findings with patient. I have reviewed nursing notes and appropriate previous records.  I feel the patient is safe to be discharged home without further emergent workup. Discussed usual and customary return precautions. Patient and family (if present) verbalize understanding and are comfortable with this plan.  Patient will follow-up with their primary care provider. If they do not have a primary care provider, information for follow-up has been provided to them. All questions have been answered.        [place], DO 10/06/15 [phone]

## 2015-10-06 NOTE — ED Notes (Signed)
Pt c/o swelling to under her right eye onset last night

## 2015-10-06 NOTE — Discharge Instructions (Signed)
Stye A stye is a bump on your eyelid caused by a bacterial infection. A stye can form inside the eyelid (internal stye) or outside the eyelid (external stye). An internal stye may be caused by an infected oil-producing gland inside your eyelid. An external stye may be caused by an infection at the base of your eyelash (hair follicle). Styes are very common. Anyone can get them at any age. They usually occur in just one eye, but you may have more than one in either eye.  CAUSES  The infection is almost always caused by bacteria called Staphylococcus aureus. This is a common type of bacteria that lives on your skin. RISK FACTORS You may be at higher risk for a stye if you have had one before. You may also be at higher risk if you have:  Diabetes.  Long-term illness.  Long-term eye redness.  A skin condition called seborrhea.  High fat levels in your blood (lipids). SIGNS AND SYMPTOMS  Eyelid pain is the most common symptom of a stye. Internal styes are more painful than external styes. Other signs and symptoms may include:  Painful swelling of your eyelid.  A scratchy feeling in your eye.  Tearing and redness of your eye.  Pus draining from the stye. DIAGNOSIS  Your health care provider may be able to diagnose a stye just by examining your eye. The health care provider may also check to make sure:  You do not have a fever or other signs of a more serious infection.  The infection has not spread to other parts of your eye or areas around your eye. TREATMENT  Most styes will clear up in a few days without treatment. In some cases, you may need to use antibiotic drops or ointment to prevent infection. Your health care provider may have to drain the stye surgically if your stye is:  Large.  Causing a lot of pain.  Interfering with your vision. This can be done using a thin blade or a needle.  HOME CARE INSTRUCTIONS   Take medicines only as directed by your health care  provider.  Apply a clean, warm compress to your eye for 10 minutes, 4 times a day.  Do not wear contact lenses or eye makeup until your stye has healed.  Do not try to pop or drain the stye. SEEK MEDICAL CARE IF:  You have chills or a fever.  Your stye does not go away after several days.  Your stye affects your vision.  Your eyeball becomes swollen, red, or painful. MAKE SURE YOU:  Understand these instructions.  Will watch your condition.  Will get help right away if you are not doing well or get worse.   This information is not intended to replace advice given to you by your health care provider. Make sure you discuss any questions you have with your health care provider.   Document Released: 02/27/2005 Document Revised: 06/10/2014 Document Reviewed: 09/03/2013 Elsevier Interactive Patient Education 2016 Elsevier Inc.  

## 2015-11-23 ENCOUNTER — Encounter: Payer: Self-pay | Admitting: Gastroenterology

## 2015-11-29 ENCOUNTER — Telehealth (HOSPITAL_COMMUNITY): Payer: Self-pay | Admitting: *Deleted

## 2015-11-29 NOTE — Telephone Encounter (Signed)
phone call, mail box full.

## 2015-11-30 ENCOUNTER — Telehealth (HOSPITAL_COMMUNITY): Payer: Self-pay | Admitting: *Deleted

## 2015-11-30 NOTE — Telephone Encounter (Signed)
phone call, mailbox full.

## 2015-12-12 ENCOUNTER — Encounter: Payer: Self-pay | Admitting: Adult Health

## 2015-12-12 ENCOUNTER — Ambulatory Visit (INDEPENDENT_AMBULATORY_CARE_PROVIDER_SITE_OTHER): Payer: 59 | Admitting: Adult Health

## 2015-12-12 VITALS — BP 106/62 | HR 78 | Ht 61.5 in | Wt 147.0 lb

## 2015-12-12 DIAGNOSIS — N939 Abnormal uterine and vaginal bleeding, unspecified: Secondary | ICD-10-CM | POA: Insufficient documentation

## 2015-12-12 DIAGNOSIS — Z3202 Encounter for pregnancy test, result negative: Secondary | ICD-10-CM

## 2015-12-12 DIAGNOSIS — N898 Other specified noninflammatory disorders of vagina: Secondary | ICD-10-CM

## 2015-12-12 HISTORY — DX: Abnormal uterine and vaginal bleeding, unspecified: N93.9

## 2015-12-12 HISTORY — DX: Other specified noninflammatory disorders of vagina: N89.8

## 2015-12-12 LAB — POCT URINE PREGNANCY: Preg Test, Ur: NEGATIVE

## 2015-12-12 NOTE — Progress Notes (Signed)
Subjective:     Patient ID: Charlotte Mccormick, female   DOB: 13-Apr-1986, 30 y.o.   MRN: [id number]  HPI Charlotte Mccormick is a 30 yea old black female, engaged in complaining of having her period the whole month of June and vaginal dryness, had no pain, and had not skipped any pills or added any new meds.She is getting married in December, and may want another child then.    Review of Systems Patient denies any headaches, hearing loss, fatigue, blurred vision, shortness of breath, chest pain, abdominal pain, problems with bowel movements, urination, or intercourse. No joint pain or mood swings.See HPI for positives.  Reviewed past medical,surgical, social and family history. Reviewed medications and allergies.     Objective:   Physical Exam BP 106/62 mmHg  Pulse 78  Ht 5' 1.5" (1.562 m)  Wt 147 lb (66.679 kg)  BMI 27.33 kg/m2  LMP 11/06/2015 Skin warm and dry.Pelvic: external genitalia is normal in appearance no lesions, vagina: tan discharge without odor,urethra has no lesions or masses noted, cervix:smooth and bulbous, uterus: normal size, shape and contour, non tender, no masses felt, adnexa: no masses or tenderness noted. Bladder is non tender, no masses felt.  GC/CHL obtained. Try good lubricate, put some on him too, will get back in for pap and physical, and if has bleeding again will get [place] and may change OCs, discussed that when ready to get pregnant will stop OCs 3 months in advance and start prenatal vitamins with folic acid. Face time 20 minutes with 50 % counseling.     Assessment:     AUB  Vaginal dryness     Plan:    GC/CHL sent Try good lubricate like astroglide or KY jelly or even olive oil Return in 5 weeks for pap and physical

## 2015-12-12 NOTE — Patient Instructions (Signed)
Return in 5 weeks for pap and physical  Use astroglide or KY jelly or even olive oil

## 2015-12-14 LAB — GC/CHLAMYDIA PROBE AMP
Chlamydia trachomatis, NAA: NEGATIVE
NEISSERIA GONORRHOEAE BY PCR: NEGATIVE

## 2016-01-02 ENCOUNTER — Ambulatory Visit: Payer: Managed Care, Other (non HMO) | Admitting: Nurse Practitioner

## 2016-01-16 ENCOUNTER — Ambulatory Visit (INDEPENDENT_AMBULATORY_CARE_PROVIDER_SITE_OTHER): Payer: 59 | Admitting: Nurse Practitioner

## 2016-01-16 ENCOUNTER — Encounter: Payer: Self-pay | Admitting: Nurse Practitioner

## 2016-01-16 DIAGNOSIS — K5900 Constipation, unspecified: Secondary | ICD-10-CM

## 2016-01-16 DIAGNOSIS — K219 Gastro-esophageal reflux disease without esophagitis: Secondary | ICD-10-CM

## 2016-01-16 DIAGNOSIS — K921 Melena: Secondary | ICD-10-CM | POA: Diagnosis not present

## 2016-01-16 MED ORDER — PANTOPRAZOLE SODIUM 40 MG PO TBEC: 40.0000 mg | DELAYED_RELEASE_TABLET | Freq: Every day | ORAL | 2 refills | Status: DC

## 2016-01-16 MED ORDER — LINACLOTIDE 72 MCG PO CAPS: 72.0000 ug | ORAL_CAPSULE | Freq: Every day | ORAL | 0 refills | Status: DC

## 2016-01-16 NOTE — Assessment & Plan Note (Signed)
Patient with a history of constipation. Has a bowel movement about once a week to once every 2 weeks. Has not tried over-the-counter recommendations made to her by urgent care. Regardless, she will likely need something more than added fiber to her diet. I will start her on Linzess 72 g with samples for 2 weeks. She is instructed to call us with a progress report in 2 weeks. Recommend increasing fiber and water in her diet as well. Return for follow-up in 2 months.

## 2016-01-16 NOTE — Assessment & Plan Note (Signed)
Patient with a history of GERD since childbirth in 2013. Has been taking intermittent over-the-counter Prilosec. Symptoms include epigastric pain, nausea, vomiting all of which are worse with eating. Still eating trigger foods. I will have her stop over-the-counter Prilosec, start daily Protonix. Stressed the importance of taking the Protonix every day. Recommend avoid trigger foods and NSAIDs (currently takes ibuprofen a couple times a week) 6. Return for follow-up in 2 months and if persistent problems on per prescription and PPI can consider upper endoscopy for further evaluation.

## 2016-01-16 NOTE — Progress Notes (Signed)
cc'ed to pcp °

## 2016-01-16 NOTE — Patient Instructions (Signed)
1. Stop taking Prilosec over-the-counter. 2. Start taking Protonix 40 mg once a day. I send a prescription to your pharmacy. 3. We will give you samples of Linzess 72 g. Take 1, once a day on an empty stomach. 4. Avoid foods and items that worsen your symptoms. 5. Stop taking ibuprofen as this can worsen your symptoms and possibly lead to an ulcer. 6. Complete the stool test and ran back to our office. 7. Return for follow-up in 2 months.

## 2016-01-16 NOTE — Assessment & Plan Note (Signed)
Patient notes black stools, states that they did not check for blood at urgent care. She is on iron, however she also has a history of anemia and she is not very informative as to possible cause. I will have her do an iFob to check for blood in depending on results may need further endoscopic evaluation. Return for follow-up in 2 months.

## 2016-01-16 NOTE — Progress Notes (Addendum)
REVIEWED-NO ADDITIONAL RECOMMENDATIONS.   Primary Care Physician:  Cristal Ford, MD Primary Gastroenterologist:  Dr. Oneida Alar  Chief Complaint  Patient presents with  . Constipation  . Gastroesophageal Reflux    HPI:   Charlotte Mccormick is a 30 y.o. female who presents On referral from urgent care for acid reflux and constipation. Patient was seen by urgent care 11/20/2015 which noted dark stools without melena, vomiting. Denied abdominal pain. Only 1 bowel movement in 2 weeks, no previous bowel movement in the 10 days prior to her visit. Assessment was nausea with history of GERD and constipation. She was given milk of magnesia one dose only, 8 glasses of water a day, Metamucil versus Citrucel. She was given Phenergan when necessary and referred to GI.  Today she states she has had acid reflux since pregnancy in 2013. Takes OTC prilosec. Has typical triggers including tomato-based products, citrus, spicy foods. Nausea associated with her symptoms. Denies hematochezia. States stools are black, not checked for blood at urgent care. On Iron. Takes Ibuprofen 2-3 times a week. Typically has a bowel movement once every 1-2 weeks, hard stools and straining. Hasn't tried recommendations given by urgent care. Denies chest pain, dyspnea, dizziness, lightheadedness, syncope, near syncope. Denies any other upper or lower GI symptoms.  Has a history of anemia, also history of abnormal uterine bleeding. But she is not sure why she has anemia.  Past Medical History:  Diagnosis Date  . Abnormal uterine bleeding (AUB) 12/12/2015  . Anemia   . Anxiety   . Depression   . Sinusitis   . Vaginal dryness 12/12/2015    No past surgical history on file.  Current Outpatient Prescriptions  Medication Sig Dispense Refill  . ibuprofen (ADVIL,MOTRIN) 800 MG tablet Take 1 tablet (800 mg total) by mouth every 8 (eight) hours as needed for mild pain. 30 tablet 0  . IRON PO Take by mouth daily.    .  Omeprazole (PRILOSEC PO) Take by mouth daily as needed.     . promethazine (PHENERGAN) 25 MG tablet TK 1 T PO Q 8 H PRN  0   No current facility-administered medications for this visit.     Allergies as of 01/16/2016 - Review Complete 01/16/2016  Allergen Reaction Noted  . Bee venom Swelling 04/12/2013    Family History  Problem Relation Age of Onset  . Heart disease Mother   . Hypertension Mother   . Hypertension Father   . Hyperlipidemia Father   . Diabetes Father   . Eczema Daughter   . Colon cancer Neg Hx     Social History   Social History  . Marital status: Single    Spouse name: N/A  . Number of children: N/A  . Years of education: N/A   Occupational History  . Not on file.   Social History Main Topics  . Smoking status: Former Smoker    Years: 5.00    Types: Cigarettes    Quit date: 06/03/2010  . Smokeless tobacco: Never Used  . Alcohol use Yes     Comment: Wine three times a week  . Drug use: No  . Sexual activity: Yes    Birth control/ protection: Pill   Other Topics Concern  . Not on file   Social History Narrative  . No narrative on file    Review of Systems: 10-point ROS negative except as per HPI.    Physical Exam: BP 121/79   Pulse 88   Temp 98.8 F (37.1 C) (  Oral)   Ht 5' 1.5" (1.562 m)   Wt 150 lb 6.4 oz (68.2 kg)   LMP 01/06/2016 (Exact Date)   BMI 27.96 kg/m  General:   Alert and oriented. Pleasant and cooperative. Well-nourished and well-developed.  Head:  Normocephalic and atraumatic. Eyes:  Without icterus, sclera clear and conjunctiva pink.  Ears:  Normal auditory acuity. Cardiovascular:  S1, S2 present without murmurs appreciated. Extremities without clubbing or edema. Respiratory:  Clear to auscultation bilaterally. No wheezes, rales, or rhonchi. No distress.  Gastrointestinal:  +BS, soft, and non-distended.Mild generalized abdominal discomfort. No HSM noted. No guarding or rebound. No masses appreciated.  Rectal:   Deferred  Musculoskalatal:  Symmetrical without gross deformities. Neurologic:  Alert and oriented x4;  grossly normal neurologically. Psych:  Alert and cooperative. Normal mood and affect. Heme/Lymph/Immune: No excessive bruising noted.    01/16/2016 8:36 AM   Disclaimer: This note was dictated with voice recognition software. Similar sounding words can inadvertently be transcribed and may not be corrected upon review.

## 2016-01-17 ENCOUNTER — Ambulatory Visit (INDEPENDENT_AMBULATORY_CARE_PROVIDER_SITE_OTHER): Payer: 59 | Admitting: Adult Health

## 2016-01-17 ENCOUNTER — Other Ambulatory Visit (HOSPITAL_COMMUNITY)
Admission: RE | Admit: 2016-01-17 | Discharge: 2016-01-17 | Disposition: A | Discharge status: Home / Self Care | Payer: 59 | Source: Ambulatory Visit | Attending: Adult Health | Admitting: Adult Health

## 2016-01-17 ENCOUNTER — Encounter: Payer: Self-pay | Admitting: Adult Health

## 2016-01-17 VITALS — BP 100/70 | HR 78 | Ht 61.0 in | Wt 150.0 lb

## 2016-01-17 DIAGNOSIS — Z01419 Encounter for gynecological examination (general) (routine) without abnormal findings: Secondary | ICD-10-CM | POA: Diagnosis not present

## 2016-01-17 DIAGNOSIS — Z1151 Encounter for screening for human papillomavirus (HPV): Secondary | ICD-10-CM | POA: Insufficient documentation

## 2016-01-17 DIAGNOSIS — Z30011 Encounter for initial prescription of contraceptive pills: Secondary | ICD-10-CM | POA: Insufficient documentation

## 2016-01-17 MED ORDER — NORGESTIM-ETH ESTRAD TRIPHASIC 0.18/0.215/0.25 MG-35 MCG PO TABS: 1.0000 | ORAL_TABLET | Freq: Every day | ORAL | 11 refills | Status: DC

## 2016-01-17 NOTE — Patient Instructions (Signed)
Physical in 1 year, pap in 3 if normal  Mammogram at 40  Labs at work

## 2016-01-17 NOTE — Progress Notes (Signed)
Patient ID: Charlotte Mccormick, female   DOB: Aug 07, 1985, 30 y.o.   MRN: [id number] History of Present Illness: [name] is a 30 year old black female in for a well woman gyn exam and pap.She has had some constipation and saw by Randall Hiss at [facility], she was given linzess but has not started yet.She is telling me today she wants to go back on birth control ,ortho tri cyclen.She has appt at [facility] to see psychiatrist 8/21 and sees Denyse Amass a therapist there also for her moods,anxiety and not sleeping well.She says she wants to get life in order before getting pregnant.   Current Medications, Allergies, Past Medical History, Past Surgical History, Family History and Social History were reviewed in [employer] record.     Review of Systems: Patient denies any headaches, hearing loss, fatigue, blurred vision, shortness of breath, chest pain, abdominal pain, problems with  urination, or intercourse. No joint pain,see HPI for positives.    Physical Exam:BP 100/70 (BP Location: Left Arm, Patient Position: Sitting, Cuff Size: Normal)   Pulse 78   Ht 5\' 1"  (1.549 m)   Wt 150 lb (68 kg)   LMP 01/06/2016   BMI 28.34 kg/m  General:  Well developed, well nourished, no acute distress Skin:  Warm and dry Neck:  Midline trachea, normal thyroid, good ROM, no lymphadenopathy Lungs; Clear to auscultation bilaterally Breast:  No dominant palpable mass, retraction, or nipple discharge Cardiovascular: Regular rate and rhythm Abdomen:  Soft, non tender, no hepatosplenomegaly Pelvic:  External genitalia is normal in appearance, no lesions.  The vagina is normal in appearance. Urethra has no lesions or masses. The cervix is smooth,pap with HPV performed.  Uterus is felt to be normal size, shape, and contour.  No adnexal masses or tenderness noted.Bladder is non tender, no masses felt. Extremities/musculoskeletal:  No swelling or varicosities noted, no clubbing or cyanosis Psych:  No mood changes, alert  and cooperative,seems happy   Impression: Well woman gyn exam and pap Contraceptive management     Plan: Rx ortho tri cyclen,disp 1 pack take 1 daily with 11 refills, start with next period  Physical in 1 year, pap in 3 if normal  Mammogram at 40  Labs at work, she works at [employer]

## 2016-01-19 LAB — CYTOLOGY - PAP

## 2016-02-13 ENCOUNTER — Telehealth: Payer: Self-pay | Admitting: Gastroenterology

## 2016-02-13 ENCOUNTER — Ambulatory Visit (INDEPENDENT_AMBULATORY_CARE_PROVIDER_SITE_OTHER): Payer: 59

## 2016-02-13 DIAGNOSIS — K5900 Constipation, unspecified: Secondary | ICD-10-CM

## 2016-02-13 DIAGNOSIS — K219 Gastro-esophageal reflux disease without esophagitis: Secondary | ICD-10-CM

## 2016-02-13 DIAGNOSIS — K921 Melena: Secondary | ICD-10-CM

## 2016-02-13 LAB — IFOBT (OCCULT BLOOD): IMMUNOLOGICAL FECAL OCCULT BLOOD TEST: POSITIVE

## 2016-02-13 NOTE — Telephone Encounter (Signed)
Forwarding to the refill box.  

## 2016-02-13 NOTE — Patient Instructions (Signed)
Pt returned IFOBT test and it was POSITIVE

## 2016-02-13 NOTE — Telephone Encounter (Signed)
Patient would like a prescription of linzess called into her pharmacy, stated that the samples eric gave her worked.

## 2016-02-14 ENCOUNTER — Telehealth: Payer: Self-pay | Admitting: Gastroenterology

## 2016-02-14 MED ORDER — LINACLOTIDE 72 MCG PO CAPS: 72.0000 ug | ORAL_CAPSULE | Freq: Every day | ORAL | 11 refills | Status: DC

## 2016-02-14 NOTE — Telephone Encounter (Signed)
LMOM that Rx has been sent in.  

## 2016-02-14 NOTE — Addendum Note (Signed)
Addended by: Mahala Menghini on: 02/14/2016 09:55 AM   Modules accepted: Orders

## 2016-02-14 NOTE — Telephone Encounter (Signed)
RX done. 

## 2016-02-14 NOTE — Telephone Encounter (Signed)
Pt called to say that [facility] was going to fax [place] a PA for [place] and could she get samples to hold her. [phone]

## 2016-02-15 ENCOUNTER — Telehealth: Payer: Self-pay

## 2016-02-15 NOTE — Telephone Encounter (Signed)
LMOM that one box of the Linzess is at front. Please call and let us know what OTC meds she has tried for constipation.

## 2016-02-15 NOTE — Telephone Encounter (Signed)
Pt was returning DS call. DS not available. I told patient she has Linzess samples up front and DS needed to know what OTC meds she has taken for constipation and the patient said, she hasn't taken anything. Pt will be here at 4 to pick up samples.

## 2016-02-15 NOTE — Telephone Encounter (Signed)
Per Manuela Schwartz, pt said she had not taken any OTC meds for constipation.

## 2016-02-15 NOTE — Telephone Encounter (Signed)
Charlotte Mccormick, if you talk to the pt, will you find out if she ever has taken anything otc? Miralax, colace etc? I have the PA information from the pharmacy.

## 2016-02-15 NOTE — Telephone Encounter (Signed)
Thanks Manuela Schwartz! Previous note sent to [place] in reference to the OTC meds.

## 2016-02-19 ENCOUNTER — Telehealth: Payer: Self-pay

## 2016-02-19 NOTE — Telephone Encounter (Signed)
Pt called and stated iFOBT was positive and she wants to know the next step. Has OV 03/19/16 with EG. Message sent to EG.

## 2016-02-20 ENCOUNTER — Telehealth: Payer: Self-pay | Admitting: Gastroenterology

## 2016-02-20 NOTE — Telephone Encounter (Signed)
Called and left message on voicemail that EG wants her to keep appt for 03/19/16 and reminded of instructions given last office visit. Return call if any questions.

## 2016-02-20 NOTE — Telephone Encounter (Signed)
We'll likely need to schedule her for a colonoscopy and EGD when we see her in the office next. Is she following the recommendations we made at her previous visit?

## 2016-02-20 NOTE — Telephone Encounter (Signed)
PATIENT CALLED WITH CONCERNS ABOUT WHAT TREATMENT SHE WILL BE NEEDING DUE TO HER POSITIVE IFOBT.  WANTS TO KNOW IF SOMETHING WILL BE ORDERED ON THE DAY OF HER FOLLOW UP APPOINTMENT   [phone]

## 2016-02-20 NOTE — Telephone Encounter (Signed)
Working on PA

## 2016-02-22 NOTE — Telephone Encounter (Signed)
LMOM for the pt to keep her appt with Randall Hiss to discuss her needs and options.  Call if she has questions about the appt.

## 2016-02-23 ENCOUNTER — Other Ambulatory Visit: Payer: Self-pay | Admitting: Nurse Practitioner

## 2016-02-23 DIAGNOSIS — R195 Other fecal abnormalities: Secondary | ICD-10-CM

## 2016-02-26 ENCOUNTER — Other Ambulatory Visit: Payer: Self-pay

## 2016-02-26 DIAGNOSIS — R195 Other fecal abnormalities: Secondary | ICD-10-CM

## 2016-02-26 NOTE — Progress Notes (Signed)
LMOM to call.

## 2016-02-28 ENCOUNTER — Other Ambulatory Visit: Payer: Self-pay

## 2016-02-28 ENCOUNTER — Telehealth: Payer: Self-pay

## 2016-02-28 DIAGNOSIS — K921 Melena: Secondary | ICD-10-CM

## 2016-02-28 NOTE — Progress Notes (Signed)
Mailed letter for pt to call.

## 2016-02-28 NOTE — Telephone Encounter (Signed)
Pt called and was informed about her results and plan. She wanted me to print her labs for [facility] and leave at the front desk for her to pick up.  Lab orders at front and pt will be by to pick up.

## 2016-03-02 LAB — CBC WITH DIFFERENTIAL/PLATELET
BASOS ABS: 0 10*3/uL (ref 0.0–0.2)
Basos: 1 %
EOS (ABSOLUTE): 0 10*3/uL (ref 0.0–0.4)
EOS: 1 %
HEMATOCRIT: 34.7 % (ref 34.0–46.6)
Hemoglobin: 11.3 g/dL (ref 11.1–15.9)
Immature Grans (Abs): 0 10*3/uL (ref 0.0–0.1)
Immature Granulocytes: 0 %
Lymphocytes Absolute: 1.2 10*3/uL (ref 0.7–3.1)
Lymphs: 39 %
MCH: 23.8 pg — AB (ref 26.6–33.0)
MCHC: 32.6 g/dL (ref 31.5–35.7)
MCV: 73 fL — AB (ref 79–97)
MONOCYTES: 12 %
MONOS ABS: 0.4 10*3/uL (ref 0.1–0.9)
NEUTROS PCT: 47 %
NRBC: 2 % — AB (ref 0–0)
Neutrophils Absolute: 1.5 10*3/uL (ref 1.4–7.0)
PLATELETS: 290 10*3/uL (ref 150–379)
RBC: 4.74 x10E6/uL (ref 3.77–5.28)
RDW: 14.9 % (ref 12.3–15.4)
WBC: 3.1 10*3/uL — AB (ref 3.4–10.8)

## 2016-03-04 NOTE — Progress Notes (Signed)
LMOM to call.

## 2016-03-05 NOTE — Progress Notes (Signed)
PT is aware.

## 2016-03-07 ENCOUNTER — Encounter: Payer: Self-pay | Admitting: Nurse Practitioner

## 2016-03-19 ENCOUNTER — Other Ambulatory Visit: Payer: Self-pay

## 2016-03-19 ENCOUNTER — Encounter: Payer: Self-pay | Admitting: Nurse Practitioner

## 2016-03-19 ENCOUNTER — Ambulatory Visit (INDEPENDENT_AMBULATORY_CARE_PROVIDER_SITE_OTHER): Payer: 59 | Admitting: Nurse Practitioner

## 2016-03-19 VITALS — BP 119/80 | HR 95 | Temp 97.8°F | Ht 62.0 in | Wt 150.2 lb

## 2016-03-19 DIAGNOSIS — R195 Other fecal abnormalities: Secondary | ICD-10-CM

## 2016-03-19 DIAGNOSIS — K219 Gastro-esophageal reflux disease without esophagitis: Secondary | ICD-10-CM

## 2016-03-19 DIAGNOSIS — K5900 Constipation, unspecified: Secondary | ICD-10-CM

## 2016-03-19 MED ORDER — PEG 3350-KCL-NA BICARB-NACL 420 G PO SOLR: 4000.0000 mL | ORAL | 0 refills | Status: DC

## 2016-03-19 NOTE — Assessment & Plan Note (Signed)
The patient has heme positive stool in the setting of significant constipation. No overt hemorrhoid symptoms. No overt hematochezia or melena noted by the patient. Heme-positive stool likely due to hemorrhoids in the setting of constipation. However, cannot rule out more insidious pathology such as colon polyp, ulcers, colon cancer. We will proceed with colonoscopy with possible upper endoscopy to further evaluate. Return for follow-up in 4 months.  Proceed with colonoscopy +/- EGD with Dr. Oneida Alar in the near future. The risks, benefits, and alternatives have been discussed in detail with the patient. They state understanding and desire to proceed.   The patient is currently on Minipress and Wellbutrin. No other anticoagulants, anxiolytics, chronic pain medications, or antidepressants. We'll provide 25 mg preprocedure Phenergan to promote adequate sedation.

## 2016-03-19 NOTE — Progress Notes (Signed)
CC'ED TO PCP 

## 2016-03-19 NOTE — Progress Notes (Addendum)
REVIEWED-NO ADDITIONAL RECOMMENDATIONS.  Referring Provider: Leisa Lenz, MD Primary Care Physician:  Cristal Ford, MD Primary GI:  Dr. Oneida Alar  Chief Complaint  Patient presents with  . Gastroesophageal Reflux  . Constipation    HPI:   Charlotte Mccormick is a 30 y.o. female who presents For follow-up on GERD and constipation. The patient was last seen in our office 01/16/2016 at which point she described reflux symptoms since she was pregnant in 2013, was using over-the-counter Prilosec at that time with typical trigger foods. Associated nausea. Dark stools on iron, uses ibuprofen 2-3 times a week. Constipation with bowel movement every 1-2 weeks, hard stools, straining. Hasn't followed through with urgent care recommendations. Orders after her last visit included an iFob, start Linzess 72 g with samples provided and request a progress report in 2 weeks, increase fiber and water. Avoid trigger foods, stop over-the-counter Prilosec started daily Protonix, avoid NSAIDs.  She called our office stating [place] worked well and a prescription was sent to her pharmacy. Her iFob came back positive. There appears to be an issue with getting Linzess due to prior off. Patient has not tried any over-the-counter medications that I am aware of area  Today she states Linzess worked well but need a PA. She states she did try Miralax and Colace previously but it gave her diarrhea bloating. She is constipated again off the Linzess. Denies abdominal pain, hematochezia, melena. Started having chest pain with starting new medication. Not currently having chest pain. Notified the prescriber and was told unlikely significant and go to ER if it gets worse, she has a follow-up in 2 days. Denies fever, chills, unintentional weight loss. No acute changes in bowel habits (back to baseline of once every 1-2 weeks). GERD symptoms much improved on Protonix. Did have a flare after strawberry cake. Is avoiding known  triggers (cut back on spaghetti and pizza). Denies dyspnea, dizziness, lightheadedness, syncope, near syncope. Denies any other upper or lower GI symptoms.  Past Medical History:  Diagnosis Date  . Abnormal uterine bleeding (AUB) 12/12/2015  . Anemia   . Anxiety   . Depression   . PTSD (post-traumatic stress disorder)   . Sinusitis   . Vaginal dryness 12/12/2015    No past surgical history on file.  Current Outpatient Prescriptions  Medication Sig Dispense Refill  . buPROPion (WELLBUTRIN SR) 100 MG 12 hr tablet     . pantoprazole (PROTONIX) 40 MG tablet Take 1 tablet (40 mg total) by mouth daily. 30 tablet 2  . prazosin (MINIPRESS) 2 MG capsule Take 2 mg by mouth at bedtime.    Marland Kitchen linaclotide (LINZESS) 72 MCG capsule Take 1 capsule (72 mcg total) by mouth daily before breakfast. (Patient not taking: Reported on 03/19/2016) 30 capsule 11   No current facility-administered medications for this visit.     Allergies as of 03/19/2016 - Review Complete 03/19/2016  Allergen Reaction Noted  . Bee venom Swelling 04/12/2013    Family History  Problem Relation Age of Onset  . Heart disease Mother   . Hypertension Mother   . Hypertension Father   . Hyperlipidemia Father   . Diabetes Father   . Eczema Daughter   . Colon cancer Neg Hx     Social History   Social History  . Marital status: Single    Spouse name: N/A  . Number of children: N/A  . Years of education: N/A   Social History Main Topics  . Smoking status: Former Smoker  Years: 5.00    Types: Cigarettes    Quit date: 06/03/2010  . Smokeless tobacco: Never Used  . Alcohol use No     Comment: 03/19/16 currently no ETOH due to meds; Previously: Wine three times a week  . Drug use: No  . Sexual activity: Yes    Birth control/ protection: None   Other Topics Concern  . None   Social History Narrative  . None    Review of Systems: Complete ROS negative except as per HPI.   Physical Exam: BP 119/80   Pulse  95   Temp 97.8 F (36.6 C) (Oral)   Ht 5\' 2"  (1.575 m)   Wt 150 lb 3.2 oz (68.1 kg)   BMI 27.47 kg/m  General:   Alert and oriented. Pleasant and cooperative. Well-nourished and well-developed.  Eyes:  Without icterus, sclera clear and conjunctiva pink.  Throat/Neck:  Supple, without mass or thyromegaly. Cardiovascular:  S1, S2 present without murmurs appreciated. Extremities without clubbing or edema. Respiratory:  Clear to auscultation bilaterally. No wheezes, rales, or rhonchi. No distress.  Gastrointestinal:  +BS, soft, non-tender and non-distended. No HSM noted. No guarding or rebound. No masses appreciated.  Rectal:  Deferred  Musculoskalatal:  Symmetrical without gross deformities. Neurologic:  Alert and oriented x4;  grossly normal neurologically. Psych:  Alert and cooperative. Normal mood and affect. Heme/Lymph/Immune: No excessive bruising noted.    03/19/2016 9:43 AM   Disclaimer: This note was dictated with voice recognition software. Similar sounding words can inadvertently be transcribed and may not be corrected upon review.

## 2016-03-19 NOTE — Patient Instructions (Addendum)
1. We will schedule your procedure for you. 2. Further recommendations to be made based on the results of your procedure. 3. Continue taking Protonix. 4. I'm providing samples of Linzess as well as a co-pay card. We will check on the prior off forgetting Linzess covered. 5. We will provide you with a list of local primary care providers. 6. Return for follow-up in 4 months.

## 2016-03-19 NOTE — Assessment & Plan Note (Signed)
We will reinitiate a prior authorization for Linzess as the patient has tried and failed MiraLAX and Colace. Linzess works very well for her. I will write her samples today and a co-pay card. Recommend she establish herself with a primary care and the area and we will provide a list for her. Return for follow-up in 4 months.

## 2016-03-19 NOTE — Assessment & Plan Note (Signed)
GERD symptoms currently well managed on once a day Protonix. Continue Protonix, return for follow-up in 4 months.

## 2016-04-01 ENCOUNTER — Telehealth: Payer: Self-pay

## 2016-04-01 ENCOUNTER — Other Ambulatory Visit: Payer: Self-pay

## 2016-04-01 DIAGNOSIS — K219 Gastro-esophageal reflux disease without esophagitis: Secondary | ICD-10-CM

## 2016-04-01 DIAGNOSIS — K5900 Constipation, unspecified: Secondary | ICD-10-CM

## 2016-04-01 DIAGNOSIS — K921 Melena: Secondary | ICD-10-CM

## 2016-04-01 NOTE — Telephone Encounter (Signed)
Request for Pantoprazole  40 mg to be sent to [facility].

## 2016-04-02 MED ORDER — LINACLOTIDE 72 MCG PO CAPS: 72.0000 ug | ORAL_CAPSULE | Freq: Every day | ORAL | 3 refills | Status: DC

## 2016-04-03 MED ORDER — PANTOPRAZOLE SODIUM 40 MG PO TBEC: 40.0000 mg | DELAYED_RELEASE_TABLET | Freq: Every day | ORAL | 3 refills | Status: DC

## 2016-04-03 NOTE — Telephone Encounter (Signed)
Please tell the patient her Rx refill was sent to [facility] as requested.

## 2016-04-03 NOTE — Telephone Encounter (Signed)
LMOM that Rx has been sent to [place]. Call if questions!

## 2016-04-08 ENCOUNTER — Ambulatory Visit (HOSPITAL_COMMUNITY)
Admission: RE | Admit: 2016-04-08 | Discharge: 2016-04-08 | Disposition: A | Discharge status: Home / Self Care | Payer: 59 | Source: Ambulatory Visit | Attending: Gastroenterology | Admitting: Gastroenterology

## 2016-04-08 ENCOUNTER — Encounter (HOSPITAL_COMMUNITY): Payer: Self-pay | Admitting: *Deleted

## 2016-04-08 ENCOUNTER — Encounter (HOSPITAL_COMMUNITY)
Admission: RE | Disposition: A | Discharge status: Home / Self Care | Payer: Self-pay | Source: Ambulatory Visit | Attending: Gastroenterology

## 2016-04-08 DIAGNOSIS — R195 Other fecal abnormalities: Secondary | ICD-10-CM | POA: Diagnosis present

## 2016-04-08 DIAGNOSIS — K311 Adult hypertrophic pyloric stenosis: Secondary | ICD-10-CM | POA: Diagnosis not present

## 2016-04-08 DIAGNOSIS — F419 Anxiety disorder, unspecified: Secondary | ICD-10-CM | POA: Diagnosis not present

## 2016-04-08 DIAGNOSIS — D500 Iron deficiency anemia secondary to blood loss (chronic): Secondary | ICD-10-CM | POA: Diagnosis not present

## 2016-04-08 DIAGNOSIS — K5730 Diverticulosis of large intestine without perforation or abscess without bleeding: Secondary | ICD-10-CM | POA: Insufficient documentation

## 2016-04-08 DIAGNOSIS — F4310 Post-traumatic stress disorder, unspecified: Secondary | ICD-10-CM | POA: Diagnosis not present

## 2016-04-08 DIAGNOSIS — Z87891 Personal history of nicotine dependence: Secondary | ICD-10-CM | POA: Insufficient documentation

## 2016-04-08 DIAGNOSIS — Z8249 Family history of ischemic heart disease and other diseases of the circulatory system: Secondary | ICD-10-CM | POA: Insufficient documentation

## 2016-04-08 DIAGNOSIS — K222 Esophageal obstruction: Secondary | ICD-10-CM | POA: Insufficient documentation

## 2016-04-08 DIAGNOSIS — K2950 Unspecified chronic gastritis without bleeding: Secondary | ICD-10-CM | POA: Diagnosis not present

## 2016-04-08 DIAGNOSIS — Z91030 Bee allergy status: Secondary | ICD-10-CM | POA: Diagnosis not present

## 2016-04-08 DIAGNOSIS — K621 Rectal polyp: Secondary | ICD-10-CM | POA: Insufficient documentation

## 2016-04-08 DIAGNOSIS — Z833 Family history of diabetes mellitus: Secondary | ICD-10-CM | POA: Insufficient documentation

## 2016-04-08 DIAGNOSIS — K219 Gastro-esophageal reflux disease without esophagitis: Secondary | ICD-10-CM | POA: Insufficient documentation

## 2016-04-08 DIAGNOSIS — K5900 Constipation, unspecified: Secondary | ICD-10-CM | POA: Diagnosis not present

## 2016-04-08 DIAGNOSIS — F329 Major depressive disorder, single episode, unspecified: Secondary | ICD-10-CM | POA: Insufficient documentation

## 2016-04-08 HISTORY — PX: COLONOSCOPY: SHX5424

## 2016-04-08 HISTORY — PX: ESOPHAGOGASTRODUODENOSCOPY: SHX5428

## 2016-04-08 SURGERY — COLONOSCOPY
Anesthesia: Moderate Sedation

## 2016-04-08 MED ORDER — PROMETHAZINE HCL 25 MG/ML IJ SOLN
INTRAMUSCULAR | Status: AC
  Filled 2016-04-08: qty 1

## 2016-04-08 MED ORDER — MEPERIDINE HCL 100 MG/ML IJ SOLN
INTRAMUSCULAR | Status: AC
  Filled 2016-04-08: qty 2

## 2016-04-08 MED ORDER — SODIUM CHLORIDE 0.9% FLUSH
INTRAVENOUS | Status: AC
  Filled 2016-04-08: qty 10

## 2016-04-08 MED ORDER — MIDAZOLAM HCL 5 MG/5ML IJ SOLN
INTRAMUSCULAR | Status: AC
  Filled 2016-04-08: qty 10

## 2016-04-08 MED ORDER — DEXTROSE-NACL 5-0.9 % IV SOLN: INTRAVENOUS | Status: DC | PRN

## 2016-04-08 MED ORDER — PROMETHAZINE HCL 25 MG/ML IJ SOLN
25.0000 mg | Freq: Once | INTRAMUSCULAR | Status: AC
  Administered 2016-04-08: 25 mg via INTRAVENOUS

## 2016-04-08 MED ORDER — MEPERIDINE HCL 100 MG/ML IJ SOLN
INTRAMUSCULAR | Status: DC | PRN
  Administered 2016-04-08 (×2): 25 mg via INTRAVENOUS

## 2016-04-08 MED ORDER — STERILE WATER FOR IRRIGATION IR SOLN
Status: DC | PRN
  Administered 2016-04-08: 100 mL

## 2016-04-08 MED ORDER — MINERAL OIL PO OIL
TOPICAL_OIL | ORAL | Status: DC
Start: 2016-04-08 — End: 2016-04-08
  Filled 2016-04-08: qty 30

## 2016-04-08 MED ORDER — LIDOCAINE VISCOUS 2 % MT SOLN
OROMUCOSAL | Status: AC
  Filled 2016-04-08: qty 15

## 2016-04-08 MED ORDER — MIDAZOLAM HCL 5 MG/5ML IJ SOLN
INTRAMUSCULAR | Status: DC | PRN
  Administered 2016-04-08 (×2): 1 mg via INTRAVENOUS
  Administered 2016-04-08: 2 mg via INTRAVENOUS
  Administered 2016-04-08: 1 mg via INTRAVENOUS

## 2016-04-08 MED ORDER — SODIUM CHLORIDE 0.9 % IV SOLN
INTRAVENOUS | Status: DC
  Administered 2016-04-08: 1000 mL via INTRAVENOUS

## 2016-04-08 MED ORDER — LIDOCAINE VISCOUS 2 % MT SOLN
OROMUCOSAL | Status: DC | PRN
  Administered 2016-04-08: 4 mL via OROMUCOSAL

## 2016-04-08 NOTE — Op Note (Addendum)
[facility] Patient Name: Charlotte Mccormick Procedure Date: 04/08/2016 4:46 PM MRN: [id number] Date of Birth: [birth year] Attending MD: Barney Drain , MD CSN: [id number] Age: 30 Admit Type: Outpatient Procedure:                Upper GI endoscopy with cold forceps biopsy/BALLOON                            DILATION OF PYLORUS Indications:              Upper abdominal pain, , Iron deficiency anemia                            secondary to chronic blood loss, Dyspepsia. PT WAS                            A DIFFUCLT STICK. RNs AND ANESTHESIOLOGIST UNABLE                            TO OBTAIN IV ACCESS. MIDLINE PLACED BY IV TEAM. Providers:                Barney Drain, MD, Rosina Lowenstein, RN, Randa Spike,                            Technician Referring MD:             Norwood Levo. Simpson MD, MD Medicines:                TCS + Meperidine 25 mg IV, Midazolam 2 mg IV Complications:            No immediate complications. Estimated Blood Loss:     Estimated blood loss was minimal. Procedure:                Pre-Anesthesia Assessment:                           - Prior to the procedure, a History and Physical                            was performed, and patient medications and                            allergies were reviewed. The patient's tolerance of                            previous anesthesia was also reviewed. The risks                            and benefits of the procedure and the sedation                            options and risks were discussed with the patient.                            All questions were answered, and informed consent  was obtained. Prior Anticoagulants: The patient has                            taken no previous anticoagulant or antiplatelet                            agents. ASA Grade Assessment: II - A patient with                            mild systemic disease. After reviewing the risks                            and benefits, the  patient was deemed in                            satisfactory condition to undergo the procedure.                            After obtaining informed consent, the endoscope was                            passed under direct vision. Throughout the                            procedure, the patient's blood pressure, pulse, and                            oxygen saturations were monitored continuously. The                            [id number]) scope was introduced through the                            mouth, and advanced to the second part of duodenum.                            The upper GI endoscopy was somewhat difficult due                            to the patient's agitation. Successful completion                            of the procedure was aided by increasing the dose                            of sedation medication. The patient tolerated the                            procedure fairly well. Scope In: 4:50:20 PM Scope Out: 5:01:13 PM Total Procedure Duration: 0 hours 10 minutes 53 seconds  Findings:      Patchy mild inflammation characterized by congestion (edema) and       erythema was found in the gastric antrum. Biopsies were taken with a  cold forceps for Helicobacter pylori testing.      The examined duodenum was normal.      A benign-appearing, intrinsic moderate stenosis was found at the       pylorus. This was traversed. A TTS dilator was passed through the scope.       Dilation with a 12-13.5-15 mm pyloric balloon dilator was performed. The       dilation site was examined following endoscope reinsertion and showed       complete resolution of luminal narrowing. Estimated blood loss was       minimal(TRACE HEME AFTER DILATION).      A widely patent and mild Schatzki ring (acquired) was found at the       gastroesophageal junction.      A medium-sized hiatal hernia was present. Impression:               - Normal esophagus.                           -  Gastritis. Biopsied.                           - UPPER ABDOMINAL PAIN MOST LIKELY DUE TO                            GERD/PYLORIC STENOSIS/GASTRITIS Moderate Sedation:      Moderate (conscious) sedation was personally administered by the       endoscopist. The following parameters were monitored: oxygen saturation,       heart rate, blood pressure, and response to care. Total physician       intraservice time was 47 minutes. Recommendation:           - High fiber diet and low fat diet.                           - Continue present medications.                           - Await pathology results.                           - Return to my office in 3 months.                           - Patient has a contact number available for                            emergencies. The signs and symptoms of potential                            delayed complications were discussed with the                            patient. Return to normal activities tomorrow.                            Written discharge instructions were provided to the  patient. Procedure Code(s):        --- Professional ---                           [phone], Esophagogastroduodenoscopy, flexible,                            transoral; with biopsy, single or multiple                           99152, Moderate sedation services provided by the                            same physician or other qualified health care                            professional performing the diagnostic or                            therapeutic service that the sedation supports,                            requiring the presence of an independent trained                            observer to assist in the monitoring of the                            patient's level of consciousness and physiological                            status; initial 15 minutes of intraservice time,                            patient age 86 years or older                            [phone], Moderate sedation services; each additional                            15 minutes intraservice time                           99153, Moderate sedation services; each additional                            15 minutes intraservice time Diagnosis Code(s):        --- Professional ---                           K29.70, Gastritis, unspecified, without bleeding                           R10.10, Upper abdominal pain, unspecified  R10.13, Epigastric pain CPT copyright 2016 American Medical Association. All rights reserved. The codes documented in this report are preliminary and upon coder review may  be revised to meet current compliance requirements. Barney Drain, MD Barney Drain, MD 04/08/2016 5:35:45 PM This report has been signed electronically. Number of Addenda: 0

## 2016-04-08 NOTE — H&P (Signed)
  Primary Care Physician:  No PCP Per Patient Primary Gastroenterologist:  Dr. Oneida Alar  Pre-Procedure History & Physical: HPI:  Charlotte Mccormick is a 30 y.o. female here for HEME POS STOOLS/DYSPEPSIA.  Past Medical History:  Diagnosis Date  . Abnormal uterine bleeding (AUB) 12/12/2015  . Anemia   . Anxiety   . Depression   . PTSD (post-traumatic stress disorder)   . Sinusitis   . Vaginal dryness 12/12/2015    History reviewed. No pertinent surgical history.  Prior to Admission medications   Medication Sig Start Date End Date Taking? Authorizing Provider  buPROPion (WELLBUTRIN SR) 100 MG 12 hr tablet Take 100 mg by mouth 2 (two) times daily.  02/22/16  Yes Historical Provider, MD  Multiple Vitamin (MULTIVITAMIN WITH MINERALS) TABS tablet Take 1 tablet by mouth daily.   Yes Historical Provider, MD  pantoprazole (PROTONIX) 40 MG tablet Take 1 tablet (40 mg total) by mouth daily. 04/03/16  Yes Carlis Stable, NP  prazosin (MINIPRESS) 2 MG capsule Take 2 mg by mouth at bedtime.   Yes Historical Provider, MD  linaclotide Rolan Lipa) 72 MCG capsule Take 1 capsule (72 mcg total) by mouth daily before breakfast. 04/02/16   Carlis Stable, NP    Allergies as of 03/19/2016 - Review Complete 03/19/2016  Allergen Reaction Noted  . Bee venom Swelling 04/12/2013    Family History  Problem Relation Age of Onset  . Heart disease Mother   . Hypertension Mother   . Hypertension Father   . Hyperlipidemia Father   . Diabetes Father   . Eczema Daughter   . Colon cancer Neg Hx     Social History   Social History  . Marital status: Single    Spouse name: N/A  . Number of children: N/A  . Years of education: N/A   Occupational History  . Not on file.   Social History Main Topics  . Smoking status: Former Smoker    Packs/day: 0.50    Years: 5.00    Types: Cigarettes    Quit date: 06/03/2010  . Smokeless tobacco: Never Used  . Alcohol use No     Comment: 03/19/16 currently no ETOH due to meds;  Previously: Wine three times a week  . Drug use: No  . Sexual activity: Yes    Birth control/ protection: None   Other Topics Concern  . Not on file   Social History Narrative  . No narrative on file    Review of Systems: See HPI, otherwise negative ROS   Physical Exam: BP 107/62   Pulse 78   Temp 98.7 F (37.1 C) (Oral)   Resp (!) 21   LMP 03/23/2016 (Exact Date)   SpO2 100%  General:   Alert,  pleasant and cooperative in NAD Head:  Normocephalic and atraumatic. Neck:  Supple; Lungs:  Clear throughout to auscultation.    Heart:  Regular rate and rhythm. Abdomen:  Soft, nontender and nondistended. Normal bowel sounds, without guarding, and without rebound.   Neurologic:  Alert and  oriented x4;  grossly normal neurologically.  Impression/Plan:     HEME POS STOOLS/DYSPEPSIA  PLAN:  1.TCS/ EGD TODAY.DISCUSSED PROCEDURE, BENEFITS, & RISKS: < 1% chance of medication reaction, bleeding, perforation, or rupture of spleen/liver.

## 2016-04-08 NOTE — Progress Notes (Signed)
Charlotte Mccormick was at [facility] on 04/08/16 for a procedure and cannot return to work until 04/09/16 at 7:00pm.

## 2016-04-08 NOTE — Op Note (Signed)
[facility] Patient Name: Charlotte Mccormick Procedure Date: 04/08/2016 4:03 PM MRN: [id number] Date of Birth: 03/22/1986 Attending MD: Barney Drain , MD CSN: [id number] Age: 30 Admit Type: Outpatient Procedure:                Colonoscopy WITH COLD FORCEPS POLYPECTOMY Indications:              Heme positive stool.LINZES HELPS CONSTIPATION.                            PROTONIX HELPS REFLUX. OCCASIONALLY USES TYLENOL                            AND ASA. Providers:                Barney Drain, MD, Rosina Lowenstein, RN, Randa Spike,                            Technician Referring MD:             Norwood Levo. Simpson MD, MD Medicines:                Promethazine 25 mg IV, Meperidine 50 mg IV,                            Midazolam 5 mg IV Complications:            No immediate complications. Estimated Blood Loss:     Estimated blood loss was minimal. Procedure:                Pre-Anesthesia Assessment:                           - Prior to the procedure, a History and Physical                            was performed, and patient medications and                            allergies were reviewed. The patient's tolerance of                            previous anesthesia was also reviewed. The risks                            and benefits of the procedure and the sedation                            options and risks were discussed with the patient.                            All questions were answered, and informed consent                            was obtained. Prior Anticoagulants: The patient has  taken no previous anticoagulant or antiplatelet                            agents. ASA Grade Assessment: II - A patient with                            mild systemic disease. After reviewing the risks                            and benefits, the patient was deemed in                            satisfactory condition to undergo the procedure.                           After  obtaining informed consent, the colonoscope                            was passed under direct vision. Throughout the                            procedure, the patient's blood pressure, pulse, and                            oxygen saturations were monitored continuously. The                            [id number] scope was introduced through                            the anus and advanced to the 3 cm into the ileum.                            The terminal ileum, ileocecal valve, appendiceal                            orifice, and rectum were photographed. The                            colonoscopy was somewhat difficult due to a                            tortuous colon and the patient's agitation.                            Successful completion of the procedure was aided by                            increasing the dose of sedation medication,                            straightening and shortening the scope to obtain  bowel loop reduction and COLOWRAP. The patient                            tolerated the procedure fairly well. The quality of                            the bowel preparation was excellent. Scope In: 4:28:20 PM Scope Out: 4:44:02 PM Scope Withdrawal Time: 0 hours 11 minutes 30 seconds  Total Procedure Duration: 0 hours 15 minutes 42 seconds  Findings:      The recto-sigmoid colon and sigmoid colon were mildly redundant AND       COLOWRAP WAS USED TO REACH CECUM.      A few small and large-mouthed diverticula were found in the ascending       colon and cecum.      Two sessile HYPERPLASTIC APPEARING polyps were found in the rectum. The       polyps were 2 to 3 mm in size. These polyps were removed with a cold       biopsy forceps. Resection and retrieval were complete.      The terminal ileum appeared normal. Impression:               - Redundant LEFT colon.                           - MILD Diverticulosis in the ascending colon and in                             the cecum.                           - Two 2 to 3 mm polyps in the rectum, removed with                            a cold biopsy forceps. Resected and retrieved.                           - NO SOURCE FOR HEME POSITIVE STOOLS IDENTIFIED. Moderate Sedation:      Moderate (conscious) sedation was administered by the endoscopy nurse       and supervised by the endoscopist. The following parameters were       monitored: oxygen saturation, heart rate, blood pressure, and response       to care. Total physician intraservice time was 47 minutes. Recommendation:           - High fiber diet and low fat diet.                           - Continue present medications.                           - Await pathology results.                           - Repeat colonoscopy in 5-10 years for surveillance.                           -  Return to my office in 3 months. CONSIDER GIVENS                            CAPSULE ENDOSCOPY.                           - Patient has a contact number available for                            emergencies. The signs and symptoms of potential                            delayed complications were discussed with the                            patient. Return to normal activities tomorrow.                            Written discharge instructions were provided to the                            patient. Procedure Code(s):        --- Professional ---                           [phone], Colonoscopy, flexible; with biopsy, single                            or multiple                           99152, Moderate sedation services provided by the                            same physician or other qualified health care                            professional performing the diagnostic or                            therapeutic service that the sedation supports,                            requiring the presence of an independent trained                            observer  to assist in the monitoring of the                            patient's level of consciousness and physiological                            status; initial 15 minutes of intraservice time,  patient age 59 years or older                           [phone], Moderate sedation services; each additional                            15 minutes intraservice time                           [phone], Moderate sedation services; each additional                            15 minutes intraservice time Diagnosis Code(s):        --- Professional ---                           K62.1, Rectal polyp                           R19.5, Other fecal abnormalities                           K57.30, Diverticulosis of large intestine without                            perforation or abscess without bleeding                           Q43.8, Other specified congenital malformations of                            intestine CPT copyright 2016 [employer]. All rights reserved. The codes documented in this report are preliminary and upon coder review may  be revised to meet current compliance requirements. Barney Drain, MD Barney Drain, MD 04/08/2016 5:26:45 PM This report has been signed electronically. Number of Addenda: 0

## 2016-04-08 NOTE — Discharge Instructions (Signed)
NO SOURCE WAS IDENTIFIED TO EXPLAIN THE BLOOD DETECTED IN YOUR STOOL. You had 2 SMALL polyps removed. YOU HAVE DIVERTICULOSIS IN THE RIGHT COLON.  YOU HAVE GASTRITIS. I STRETCHED YOUR PYLORUS DUE TO UPPER ABDOMINAL PAIN. IT WAS NARROW. YOUR PROBLEMS SWALLOWING. I BIOPSIED YOUR STOMACH.    DRINK WATER TO KEEP YOUR URINE LIGHT YELLOW.  FOLLOW A HIGH FIBER/LOW FAT DIET. AVOID ITEMS THAT CAUSE BLOATING. SEE INFO BELOW.  CONTINUE PROTONIX once daily 30 MINUTES PRIOR TO BREAKFAST.  AVOID TRIGGERS FOR REFLUX. SEE INFO BELOW.  YOUR BIOPSY RESULTS WILL BE AVAILABLE IN MY CHART NOV 10 AND MY OFFICE WILL CONTACT YOU IN 10-14 DAYS WITH YOUR RESULTS. IF YOUR BIOPSIES DON;T SHOW A REASON FOR THE BLOOD DETECTED IN YOUR STOOL, YOU MAY NEED A CAMERA PILL STUDY.  CONTINUE LINZESS.  Follow up in 3 mos.  Next colonoscopy in 10 years.    ENDOSCOPY Care After Read the instructions outlined below and refer to this sheet in the next week. These discharge instructions provide you with general information on caring for yourself after you leave the hospital. While your treatment has been planned according to the most current medical practices available, unavoidable complications occasionally occur. If you have any problems or questions after discharge, call DR. Alonda Weaber, [phone].  ACTIVITY  You may resume your regular activity, but move at a slower pace for the next 24 hours.   Take frequent rest periods for the next 24 hours.   Walking will help get rid of the air and reduce the bloated feeling in your belly (abdomen).   No driving for 24 hours (because of the medicine (anesthesia) used during the test).   You may shower.   Do not sign any important legal documents or operate any machinery for 24 hours (because of the anesthesia used during the test).    NUTRITION  Drink plenty of fluids.   You may resume your normal diet as instructed by your doctor.   Begin with a light meal and progress to  your normal diet. Heavy or fried foods are harder to digest and may make you feel sick to your stomach (nauseated).   Avoid alcoholic beverages for 24 hours or as instructed.    MEDICATIONS  You may resume your normal medications.   WHAT YOU CAN EXPECT TODAY  Some feelings of bloating in the abdomen.   Passage of more gas than usual.   Spotting of blood in your stool or on the toilet paper  .  IF YOU HAD POLYPS REMOVED DURING THE ENDOSCOPY:  Eat a soft diet IF YOU HAVE NAUSEA, BLOATING, ABDOMINAL PAIN, OR VOMITING.    FINDING OUT THE RESULTS OF YOUR TEST Not all test results are available during your visit. DR. Oneida Alar WILL CALL YOU WITHIN 14 DAYS OF YOUR PROCEDUE WITH YOUR RESULTS. Do not assume everything is normal if you have not heard from DR. Tatijana Bierly, CALL HER OFFICE AT [phone].  SEEK IMMEDIATE MEDICAL ATTENTION AND CALL THE OFFICE: [phone] IF:  You have more than a spotting of blood in your stool.   Your belly is swollen (abdominal distention).   You are nauseated or vomiting.   You have a temperature over 101F.   You have abdominal pain or discomfort that is severe or gets worse throughout the day.   Low-Fat Diet BREADS, CEREALS, PASTA, RICE, DRIED PEAS, AND BEANS These products are high in carbohydrates and most are low in fat. Therefore, they can be increased in the diet as  substitutes for fatty foods. They too, however, contain calories and should not be eaten in excess. Cereals can be eaten for snacks as well as for breakfast.   FRUITS AND VEGETABLES It is good to eat fruits and vegetables. Besides being sources of fiber, both are rich in vitamins and some minerals. They help you get the daily allowances of these nutrients. Fruits and vegetables can be used for snacks and desserts.  MEATS Limit lean meat, chicken, [place], and fish to no more than 6 ounces per day. Beef, Pork, and Lamb Use lean cuts of beef, pork, and lamb. Lean cuts include:    Extra-lean ground beef.  Arm roast.  Sirloin tip.  Center-cut ham.  Round steak.  Loin chops.  Rump roast.  Tenderloin.  Trim all fat off the outside of meats before cooking. It is not necessary to severely decrease the intake of red meat, but lean choices should be made. Lean meat is rich in protein and contains a highly absorbable form of iron. Premenopausal women, in particular, should avoid reducing lean red meat because this could increase the risk for low red blood cells (iron-deficiency anemia).  Chicken and [place] These are good sources of protein. The fat of poultry can be reduced by removing the skin and underlying fat layers before cooking. Chicken and [place] can be substituted for lean red meat in the diet. Poultry should not be fried or covered with high-fat sauces. Fish and Shellfish Fish is a good source of protein. Shellfish contain cholesterol, but they usually are low in saturated fatty acids. The preparation of fish is important. Like chicken and [place], they should not be fried or covered with high-fat sauces. EGGS Egg whites contain no fat or cholesterol. They can be eaten often. Try 1 to 2 egg whites instead of whole eggs in recipes or use egg substitutes that do not contain yolk. MILK AND DAIRY PRODUCTS Use skim or 1% milk instead of 2% or whole milk. Decrease whole milk, natural, and processed cheeses. Use nonfat or low-fat (2%) cottage cheese or low-fat cheeses made from vegetable oils. Choose nonfat or low-fat (1 to 2%) yogurt. Experiment with evaporated skim milk in recipes that call for heavy cream. Substitute low-fat yogurt or low-fat cottage cheese for sour cream in dips and salad dressings. Have at least 2 servings of low-fat dairy products, such as 2 glasses of skim (or 1%) milk each day to help get your daily calcium intake. FATS AND OILS Reduce the total intake of fats, especially saturated fat. Butterfat, lard, and beef fats are high in saturated fat and  cholesterol. These should be avoided as much as possible. Vegetable fats do not contain cholesterol, but certain vegetable fats, such as coconut oil, palm oil, and palm kernel oil are very high in saturated fats. These should be limited. These fats are often used in bakery goods, processed foods, popcorn, oils, and nondairy creamers. Vegetable shortenings and some peanut butters contain hydrogenated oils, which are also saturated fats. Read the labels on these foods and check for saturated vegetable oils. Unsaturated vegetable oils and fats do not raise blood cholesterol. However, they should be limited because they are fats and are high in calories. Total fat should still be limited to 30% of your daily caloric intake. Desirable liquid vegetable oils are corn oil, cottonseed oil, olive oil, canola oil, safflower oil, soybean oil, and sunflower oil. Peanut oil is not as good, but small amounts are acceptable. Buy a heart-healthy tub margarine that has  no partially hydrogenated oils in the ingredients. Mayonnaise and salad dressings often are made from unsaturated fats, but they should also be limited because of their high calorie and fat content. Seeds, nuts, peanut butter, olives, and avocados are high in fat, but the fat is mainly the unsaturated type. These foods should be limited mainly to avoid excess calories and fat. OTHER EATING TIPS Snacks  Most sweets should be limited as snacks. They tend to be rich in calories and fats, and their caloric content outweighs their nutritional value. Some good choices in snacks are graham crackers, melba toast, soda crackers, bagels (no egg), English muffins, fruits, and vegetables. These snacks are preferable to snack crackers, [place] fries, TORTILLA CHIPS, and POTATO chips. Popcorn should be air-popped or cooked in small amounts of liquid vegetable oil. Desserts Eat fruit, low-fat yogurt, and fruit ices instead of pastries, cake, and cookies. Sherbet, angel food  cake, gelatin dessert, frozen low-fat yogurt, or other frozen products that do not contain saturated fat (pure fruit juice bars, frozen ice pops) are also acceptable.  COOKING METHODS Choose those methods that use little or no fat. They include: Poaching.  Braising.  Steaming.  Grilling.  Baking.  Stir-frying.  Broiling.  Microwaving.  Foods can be cooked in a nonstick pan without added fat, or use a nonfat cooking spray in regular cookware. Limit fried foods and avoid frying in saturated fat. Add moisture to lean meats by using water, broth, cooking wines, and other nonfat or low-fat sauces along with the cooking methods mentioned above. Soups and stews should be chilled after cooking. The fat that forms on top after a few hours in the refrigerator should be skimmed off. When preparing meals, avoid using excess salt. Salt can contribute to raising blood pressure in some people.  EATING AWAY FROM HOME Order entres, potatoes, and vegetables without sauces or butter. When meat exceeds the size of a deck of cards (3 to 4 ounces), the rest can be taken home for another meal. Choose vegetable or fruit salads and ask for low-calorie salad dressings to be served on the side. Use dressings sparingly. Limit high-fat toppings, such as bacon, crumbled eggs, cheese, sunflower seeds, and olives. Ask for heart-healthy tub margarine instead of butter.  High-Fiber Diet A high-fiber diet changes your normal diet to include more whole grains, legumes, fruits, and vegetables. Changes in the diet involve replacing refined carbohydrates with unrefined foods. The calorie level of the diet is essentially unchanged. The Dietary Reference Intake (recommended amount) for adult males is 38 grams per day. For adult females, it is 25 grams per day. Pregnant and lactating women should consume 28 grams of fiber per day. Fiber is the intact part of a plant that is not broken down during digestion. Functional fiber is fiber  that has been isolated from the plant to provide a beneficial effect in the body. PURPOSE  Increase stool bulk.   Ease and regulate bowel movements.   Lower cholesterol.   REDUCE RISK OF COLON CANCER  INDICATIONS THAT YOU NEED MORE FIBER  Constipation and hemorrhoids.   Uncomplicated diverticulosis (intestine condition) and irritable bowel syndrome.   Weight management.   As a protective measure against hardening of the arteries (atherosclerosis), diabetes, and cancer.   GUIDELINES FOR INCREASING FIBER IN THE DIET  Start adding fiber to the diet slowly. A gradual increase of about 5 more grams (2 slices of whole-wheat bread, 2 servings of most fruits or vegetables, or 1 bowl of high-fiber cereal)  per day is best. Too rapid an increase in fiber may result in constipation, flatulence, and bloating.   Drink enough water and fluids to keep your urine clear or pale yellow. Water, juice, or caffeine-free drinks are recommended. Not drinking enough fluid may cause constipation.   Eat a variety of high-fiber foods rather than one type of fiber.   Try to increase your intake of fiber through using high-fiber foods rather than fiber pills or supplements that contain small amounts of fiber.   The goal is to change the types of food eaten. Do not supplement your present diet with high-fiber foods, but replace foods in your present diet.   INCLUDE A VARIETY OF FIBER SOURCES  Replace refined and processed grains with whole grains, canned fruits with fresh fruits, and incorporate other fiber sources. White rice, white breads, and most bakery goods contain little or no fiber.   Brown whole-grain rice, buckwheat oats, and many fruits and vegetables are all good sources of fiber. These include: broccoli, Brussels sprouts, cabbage, cauliflower, beets, sweet potatoes, white potatoes (skin on), carrots, tomatoes, eggplant, squash, berries, fresh fruits, and dried fruits.   Cereals appear to be the  richest source of fiber. Cereal fiber is found in whole grains and bran. Bran is the fiber-rich outer coat of cereal grain, which is largely removed in refining. In whole-grain cereals, the bran remains. In breakfast cereals, the largest amount of fiber is found in those with "bran" in their names. The fiber content is sometimes indicated on the label.   You may need to include additional fruits and vegetables each day.   In baking, for 1 cup white flour, you may use the following substitutions:   1 cup whole-wheat flour minus 2 tablespoons.   1/2 cup white flour plus 1/2 cup whole-wheat flour.    Polyps, Colon  A polyp is extra tissue that grows inside your body. Colon polyps grow in the large intestine. The large intestine, also called the colon, is part of your digestive system. It is a long, hollow tube at the end of your digestive tract where your body makes and stores stool. Most polyps are not dangerous. They are benign. This means they are not cancerous. But over time, some types of polyps can turn into cancer. Polyps that are smaller than a pea are usually not harmful. But larger polyps could someday become or may already be cancerous. To be safe, doctors remove all polyps and test them.   PREVENTION There is not one sure way to prevent polyps. You might be able to lower your risk of getting them if you:  Eat more fruits and vegetables and less fatty food.   Do not smoke.   Avoid alcohol.   Exercise every day.   Lose weight if you are overweight.   Eating more calcium and folate can also lower your risk of getting polyps. Some foods that are rich in calcium are milk, cheese, and broccoli. Some foods that are rich in folate are chickpeas, kidney beans, and spinach.    REFLUX  SYMPTOMS Common symptoms of GERD are heartburn (burning in your chest). This is worse when lying down or bending over. It may also cause belching, or difficulty swallowing, and indigestion. Some of the  things which make GERD worse are:  Increased weight pushes on stomach making acid rise more easily.   Smoking markedly increases acid production.   Late evening meals and going to bed with a full stomach increases pressure.  Lifestyle and home remedies TO HELP CONTROL HEARTBURN.  You may eliminate or reduce the frequency of heartburn by making the following lifestyle changes:   Control your weight. Being overweight is a major risk factor for heartburn and GERD. Excess pounds put pressure on your abdomen, pushing up your stomach and causing acid to back up into your esophagus.    Eat smaller meals. 4 TO 6 MEALS A DAY. This reduces pressure on the lower esophageal sphincter, helping to prevent the valve from opening and acid from washing back into your esophagus.    Loosen your belt. Clothes that fit tightly around your waist put pressure on your abdomen and the lower esophageal sphincter.     Eliminate heartburn triggers. Everyone has specific triggers. Common triggers such as fatty or fried foods, spicy food, tomato sauce, carbonated beverages, alcohol, chocolate, mint, garlic, onion, caffeine and nicotine may make heartburn worse.    Avoid stooping or bending. Tying your shoes is OK. Bending over for longer periods to weed your garden isn't, especially soon after eating.    Don't lie down after a meal. Wait at least three to four hours after eating before going to bed, and don't lie down right after eating.    PLACE THE HEAD OF YOUR BED ON 6 INCH BLOCKS.  Alternative medicine  Several home remedies exist for treating GERD, but they provide only temporary relief. They include drinking baking soda (sodium bicarbonate) added to water or drinking other fluids such as baking soda mixed with cream of tartar and water.  Although these liquids create temporary relief by neutralizing, washing away or buffering acids, eventually they aggravate the situation by adding gas and fluid  to your stomach, increasing pressure and causing more acid reflux. Further, adding more sodium to your diet may increase your blood pressure and add stress to your heart, and excessive bicarbonate ingestion can alter the acid-base balance in your body.  Gastritis  Gastritis is an inflammation (the body's way of reacting to injury and/or infection) of the stomach. It is often caused by viral or bacterial (germ) infections. It can also be caused BY ASPIRIN, BC/GOODY POWDER'S, (IBUPROFEN) MOTRIN, OR ALEVE (NAPROXEN), chemicals (including alcohol), SPICY FOODS, and medications. This illness may be associated with generalized malaise (feeling tired, not well), UPPER ABDOMINAL STOMACH cramps, and fever. One common bacterial cause of gastritis is an organism known as H. Pylori. This can be treated with antibiotics.

## 2016-04-09 ENCOUNTER — Telehealth: Payer: Self-pay | Admitting: Gastroenterology

## 2016-04-09 NOTE — Telephone Encounter (Signed)
There's FMLA papers from [employer] on patient and I placed them on [facility] office chair

## 2016-04-10 NOTE — Telephone Encounter (Signed)
PAPERWORK COMPLETE. 

## 2016-04-11 ENCOUNTER — Encounter (HOSPITAL_COMMUNITY): Payer: Self-pay | Admitting: Gastroenterology

## 2016-04-11 NOTE — Telephone Encounter (Signed)
VM from [facility] about the disability papers for this pt.  He said they are due on 04/16/2016.  Phone number# [phone]  Fax number #  [phone].

## 2016-04-11 NOTE — Telephone Encounter (Signed)
Pt called asking about FMLA papers. Informed pt that papers have been completed and she would need to come by office to pay $29 before they could be faxed.

## 2016-04-12 ENCOUNTER — Telehealth: Payer: Self-pay | Admitting: Gastroenterology

## 2016-04-12 NOTE — Telephone Encounter (Signed)
Pt came in today to pick up her FMLA papers and she paid $29 fee, but she said that what she had wasn't all the forms. She faxed over the missing forms and said that she needed these filled out and faxed by the end of the day. I told her that we close at noon and SF wasn't in the office, but I would let her be aware there were more forms and someone would call her on Monday. I laid the forms in [facility] office chair.

## 2016-04-12 NOTE — Telephone Encounter (Signed)
The forms are completed and up front. Charge has been entered. Copies made and I will fax once patient comes into pay her $29 fee

## 2016-04-14 NOTE — Telephone Encounter (Signed)
ON CME, ASK ERIC TO COMPLETE THE FORMS.

## 2016-04-15 NOTE — Telephone Encounter (Signed)
Completed and placed on Susan's desk (Dr. Nona Dell forms, my forms, and patient medication list)

## 2016-04-15 NOTE — Telephone Encounter (Signed)
Copies made for scanning and copies faxed to [employer].

## 2016-04-16 ENCOUNTER — Telehealth: Payer: Self-pay | Admitting: Gastroenterology

## 2016-04-16 NOTE — Telephone Encounter (Signed)
Pt came in Friday to pay for her FMLA papers, but not all forms were with it. On Monday EG completed the rest of the forms. I tried many times yesterday to fax forms to [employer] and 5 times this morning. I can not get them to go through. I have mailed the forms because [employer] needs them before Nov 20th

## 2016-04-17 NOTE — Telephone Encounter (Signed)
REVIEWED-NO ADDITIONAL RECOMMENDATIONS. 

## 2016-04-22 ENCOUNTER — Telehealth: Payer: Self-pay | Admitting: Gastroenterology

## 2016-04-22 ENCOUNTER — Telehealth: Payer: Self-pay

## 2016-04-22 NOTE — Telephone Encounter (Signed)
I completed another statement for disability. Dates patient was out of work per our office was 11/5-11/7. Return to work on 11/8.

## 2016-04-22 NOTE — Telephone Encounter (Signed)
I did not last see patient. I don't think I have ever seen her. Last seen by Randall Hiss.   Do not take Protonix. Caution is advised during pregnancy for this.  As for Linzess: no human data available regarding pregnancy safety. I have known some gynecologists to prescribe this, BUT THAT NEEDS TO BE DISCUSSED WITH OB/GYN. There is not expected to be any fetal harm as there is little to no systemic absorption. However, my recommendation is to clear the Linzess with her OB/GYN.   Routing to [employer] for [employer] and ant further recommendations.

## 2016-04-22 NOTE — Telephone Encounter (Signed)
PT just called and said she just found out that she is 4-[redacted] weeks pregnant.   Has appt next week with OB/GYN.  She wants to know if she can continue the Protonix and the Linzess.  I told her NOT TO TAKE EITHER of them until after I talk to Roseanne Kaufman, NP.

## 2016-04-23 ENCOUNTER — Encounter: Payer: Self-pay | Admitting: Nurse Practitioner

## 2016-04-23 NOTE — Telephone Encounter (Signed)
Pt is aware of the recommendations to see OB/GYN for any medications during pregnancy.

## 2016-04-23 NOTE — Telephone Encounter (Signed)
Noted, thanks. No further recommendations. During pregnancy, should generally clear all new medications with OB/GYN for best risk-based evaluation and recommendations.

## 2016-04-23 NOTE — Telephone Encounter (Signed)
Routing to Doris 

## 2016-04-24 ENCOUNTER — Encounter: Payer: Self-pay | Admitting: Adult Health

## 2016-04-24 ENCOUNTER — Encounter (HOSPITAL_COMMUNITY): Payer: Self-pay | Admitting: Advanced Practice Midwife

## 2016-04-24 ENCOUNTER — Telehealth: Payer: Self-pay | Admitting: Women's Health

## 2016-04-24 NOTE — Telephone Encounter (Signed)
Pt called stating that she has some question's regarding some medications. Please contact pt

## 2016-04-24 NOTE — Telephone Encounter (Signed)
Patient wants to know if she can take antidepressants while pregnant? She is prescribed Wellbutrin. Please advise. My chart is active so you may respond to her through that.

## 2016-04-27 ENCOUNTER — Encounter: Payer: Self-pay | Admitting: Adult Health

## 2016-04-29 ENCOUNTER — Encounter: Payer: Self-pay | Admitting: Nurse Practitioner

## 2016-05-01 ENCOUNTER — Ambulatory Visit (INDEPENDENT_AMBULATORY_CARE_PROVIDER_SITE_OTHER): Payer: 59 | Admitting: Women's Health

## 2016-05-01 ENCOUNTER — Encounter: Payer: Self-pay | Admitting: Women's Health

## 2016-05-01 VITALS — BP 100/60 | HR 96 | Ht 62.0 in | Wt 152.0 lb

## 2016-05-01 DIAGNOSIS — Z3490 Encounter for supervision of normal pregnancy, unspecified, unspecified trimester: Secondary | ICD-10-CM

## 2016-05-01 DIAGNOSIS — K5900 Constipation, unspecified: Secondary | ICD-10-CM

## 2016-05-01 DIAGNOSIS — Z3201 Encounter for pregnancy test, result positive: Secondary | ICD-10-CM

## 2016-05-01 DIAGNOSIS — R112 Nausea with vomiting, unspecified: Secondary | ICD-10-CM

## 2016-05-01 DIAGNOSIS — F4310 Post-traumatic stress disorder, unspecified: Secondary | ICD-10-CM | POA: Insufficient documentation

## 2016-05-01 DIAGNOSIS — O3680X0 Pregnancy with inconclusive fetal viability, not applicable or unspecified: Secondary | ICD-10-CM

## 2016-05-01 DIAGNOSIS — L298 Other pruritus: Secondary | ICD-10-CM | POA: Diagnosis not present

## 2016-05-01 DIAGNOSIS — N898 Other specified noninflammatory disorders of vagina: Secondary | ICD-10-CM | POA: Diagnosis not present

## 2016-05-01 LAB — POCT WET PREP (WET MOUNT)
Clue Cells Wet Prep Whiff POC: NEGATIVE
TRICHOMONAS WET PREP HPF POC: ABSENT

## 2016-05-01 LAB — POCT URINE PREGNANCY: PREG TEST UR: POSITIVE — AB

## 2016-05-01 MED ORDER — DOXYLAMINE-PYRIDOXINE 10-10 MG PO TBEC: DELAYED_RELEASE_TABLET | ORAL | 6 refills | Status: DC

## 2016-05-01 NOTE — Patient Instructions (Addendum)
Continue Wellbutrin Stop Minipress and Linzess Can take Zantac if needed  Vitamin B6 (pyridoxine) 25mg  four times a day OR 50mg  twice a day Add Unisom (doxylamine) 12.5mg  (1/2 tab) in the morning, 12.5mg  6-8 hours later, then 25mg  every night if symptoms do not improve with Vitamin B6 alone   Constipation  Drink plenty of fluid, preferably water, throughout the day  Eat foods high in fiber such as fruits, vegetables, and grains  Exercise, such as walking, is a good way to keep your bowels regular  Drink warm fluids, especially warm prune juice, or decaf coffee  Eat a 1/2 cup of real oatmeal (not instant), 1/2 cup applesauce, and 1/2-1 cup warm prune juice every day  If needed, you may take Colace (docusate sodium) stool softener once or twice a day to help keep the stool soft. If you are pregnant, wait until you are out of your first trimester (12-14 weeks of pregnancy)  If you still are having problems with constipation, you may take Miralax once daily as needed to help keep your bowels regular.  If you are pregnant, wait until you are out of your first trimester (12-14 weeks of pregnancy)  Nausea & Vomiting  Have saltine crackers or pretzels by your bed and eat a few bites before you raise your head out of bed in the morning  Eat small frequent meals throughout the day instead of large meals  Drink plenty of fluids throughout the day to stay hydrated, just don't drink a lot of fluids with your meals.  This can make your stomach fill up faster making you feel sick  Do not brush your teeth right after you eat  Products with real ginger are good for nausea, like ginger ale and ginger hard candy Make sure it says made with real ginger!  Sucking on sour candy like lemon heads is also good for nausea  If your prenatal vitamins make you nauseated, take them at night so you will sleep through the nausea  Sea Bands  If you feel like you need medicine for the nausea & vomiting please  let us know  If you are unable to keep any fluids or food down please let us know   First Trimester of Pregnancy The first trimester of pregnancy is from week 1 until the end of week 12 (months 1 through 3). A week after a sperm fertilizes an egg, the egg will implant on the wall of the uterus. This embryo will begin to develop into a baby. Genes from you and your partner are forming the baby. The female genes determine whether the baby is a boy or a girl. At 6-8 weeks, the eyes and face are formed, and the heartbeat can be seen on ultrasound. At the end of 12 weeks, all the baby's organs are formed.  Now that you are pregnant, you will want to do everything you can to have a healthy baby. Two of the most important things are to get good prenatal care and to follow your health care provider's instructions. Prenatal care is all the medical care you receive before the baby's birth. This care will help prevent, find, and treat any problems during the pregnancy and childbirth. BODY CHANGES Your body goes through many changes during pregnancy. The changes vary from woman to woman.   You may gain or lose a couple of pounds at first.  You may feel sick to your stomach (nauseous) and throw up (vomit). If the vomiting is uncontrollable, call  your health care provider.  You may tire easily.  You may develop headaches that can be relieved by medicines approved by your health care provider.  You may urinate more often. Painful urination may mean you have a bladder infection.  You may develop heartburn as a result of your pregnancy.  You may develop constipation because certain hormones are causing the muscles that push waste through your intestines to slow down.  You may develop hemorrhoids or swollen, bulging veins (varicose veins).  Your breasts may begin to grow larger and become tender. Your nipples may stick out more, and the tissue that surrounds them (areola) may become darker.  Your gums may  bleed and may be sensitive to brushing and flossing.  Dark spots or blotches (chloasma, mask of pregnancy) may develop on your face. This will likely fade after the baby is born.  Your menstrual periods will stop.  You may have a loss of appetite.  You may develop cravings for certain kinds of food.  You may have changes in your emotions from day to day, such as being excited to be pregnant or being concerned that something may go wrong with the pregnancy and baby.  You may have more vivid and strange dreams.  You may have changes in your hair. These can include thickening of your hair, rapid growth, and changes in texture. Some women also have hair loss during or after pregnancy, or hair that feels dry or thin. Your hair will most likely return to normal after your baby is born. WHAT TO EXPECT AT YOUR PRENATAL VISITS During a routine prenatal visit:  You will be weighed to make sure you and the baby are growing normally.  Your blood pressure will be taken.  Your abdomen will be measured to track your baby's growth.  The fetal heartbeat will be listened to starting around week 10 or 12 of your pregnancy.  Test results from any previous visits will be discussed. Your health care provider may ask you:  How you are feeling.  If you are feeling the baby move.  If you have had any abnormal symptoms, such as leaking fluid, bleeding, severe headaches, or abdominal cramping.  If you are using any tobacco products, including cigarettes, chewing tobacco, and electronic cigarettes.  If you have any questions. Other tests that may be performed during your first trimester include:  Blood tests to find your blood type and to check for the presence of any previous infections. They will also be used to check for low iron levels (anemia) and Rh antibodies. Later in the pregnancy, blood tests for diabetes will be done along with other tests if problems develop.  Urine tests to check for  infections, diabetes, or protein in the urine.  An ultrasound to confirm the proper growth and development of the baby.  An amniocentesis to check for possible genetic problems.  Fetal screens for spina bifida and Down syndrome.  You may need other tests to make sure you and the baby are doing well.  HIV (human immunodeficiency virus) testing. Routine prenatal testing includes screening for HIV, unless you choose not to have this test. HOME CARE INSTRUCTIONS  Medicines   Follow your health care provider's instructions regarding medicine use. Specific medicines may be either safe or unsafe to take during pregnancy.  Take your prenatal vitamins as directed.  If you develop constipation, try taking a stool softener if your health care provider approves. Diet   Eat regular, well-balanced meals. Choose a variety  of foods, such as meat or vegetable-based protein, fish, milk and low-fat dairy products, vegetables, fruits, and whole grain breads and cereals. Your health care provider will help you determine the amount of weight gain that is right for you.  Avoid raw meat and uncooked cheese. These carry germs that can cause birth defects in the baby.  Eating four or five small meals rather than three large meals a day may help relieve nausea and vomiting. If you start to feel nauseous, eating a few soda crackers can be helpful. Drinking liquids between meals instead of during meals also seems to help nausea and vomiting.  If you develop constipation, eat more high-fiber foods, such as fresh vegetables or fruit and whole grains. Drink enough fluids to keep your urine clear or pale yellow. Activity and Exercise   Exercise only as directed by your health care provider. Exercising will help you:  Control your weight.  Stay in shape.  Be prepared for labor and delivery.  Experiencing pain or cramping in the lower abdomen or low back is a good sign that you should stop exercising. Check with  your health care provider before continuing normal exercises.  Try to avoid standing for long periods of time. Move your legs often if you must stand in one place for a long time.  Avoid heavy lifting.  Wear low-heeled shoes, and practice good posture.  You may continue to have sex unless your health care provider directs you otherwise. Relief of Pain or Discomfort   Wear a good support bra for breast tenderness.   Take warm sitz baths to soothe any pain or discomfort caused by hemorrhoids. Use hemorrhoid cream if your health care provider approves.   Rest with your legs elevated if you have leg cramps or low back pain.  If you develop varicose veins in your legs, wear support hose. Elevate your feet for 15 minutes, 3-4 times a day. Limit salt in your diet. Prenatal Care   Schedule your prenatal visits by the twelfth week of pregnancy. They are usually scheduled monthly at first, then more often in the last 2 months before delivery.  Write down your questions. Take them to your prenatal visits.  Keep all your prenatal visits as directed by your health care provider. Safety   Wear your seat belt at all times when driving.  Make a list of emergency phone numbers, including numbers for family, friends, the hospital, and police and fire departments. General Tips   Ask your health care provider for a referral to a local prenatal education class. Begin classes no later than at the beginning of month 6 of your pregnancy.  Ask for help if you have counseling or nutritional needs during pregnancy. Your health care provider can offer advice or refer you to specialists for help with various needs.  Do not use hot tubs, steam rooms, or saunas.  Do not douche or use tampons or scented sanitary pads.  Do not cross your legs for long periods of time.  Avoid cat litter boxes and soil used by cats. These carry germs that can cause birth defects in the baby and possibly loss of the fetus by  miscarriage or stillbirth.  Avoid all smoking, herbs, alcohol, and medicines not prescribed by your health care provider. Chemicals in these affect the formation and growth of the baby.  Do not use any tobacco products, including cigarettes, chewing tobacco, and electronic cigarettes. If you need help quitting, ask your health care provider. You may  receive counseling support and other resources to help you quit.  Schedule a dentist appointment. At home, brush your teeth with a soft toothbrush and be gentle when you floss. SEEK MEDICAL CARE IF:   You have dizziness.  You have mild pelvic cramps, pelvic pressure, or nagging pain in the abdominal area.  You have persistent nausea, vomiting, or diarrhea.  You have a bad smelling vaginal discharge.  You have pain with urination.  You notice increased swelling in your face, hands, legs, or ankles. SEEK IMMEDIATE MEDICAL CARE IF:   You have a fever.  You are leaking fluid from your vagina.  You have spotting or bleeding from your vagina.  You have severe abdominal cramping or pain.  You have rapid weight gain or loss.  You vomit blood or material that looks like coffee grounds.  You are exposed to [place] measles and have never had them.  You are exposed to fifth disease or chickenpox.  You develop a severe headache.  You have shortness of breath.  You have any kind of trauma, such as from a fall or a car accident. This information is not intended to replace advice given to you by your health care provider. Make sure you discuss any questions you have with your health care provider. Document Released: 05/14/2001 Document Revised: 06/10/2014 Document Reviewed: 03/30/2013 Elsevier Interactive Patient Education  2017 [employer].

## 2016-05-01 NOTE — Progress Notes (Signed)
   [facility] Visit  Patient name: Charlotte Mccormick MRN [id number]  Date of birth: 07-Mar-1986  CC & HPI:  Charlotte Mccormick is a 30 y.o. G1P1 African American female presenting today for report of +PT, uncertain LMP of [id number]. +n/v, requests rx. Taking pnv. On wellbutrin and minipress for PTSD, linzess for constipation, protonix for reflux. Was told by therapist to quit wellbutrin and minipress. [place] q 1-2wks. Feels like she needs to go back on meds.  Reports vaginal itching, no odor or abnormal d/c.  Patient's last menstrual period was 03/23/2016.   Pertinent History Reviewed:  Medical & Surgical Hx:   Past medical, surgical, family, and social history reviewed in electronic medical record Medications: Reviewed & Updated - see associated section Allergies: Reviewed in electronic medical record  Objective Findings:  Vitals: BP 100/60 (BP Location: Right Arm, Patient Position: Sitting, Cuff Size: Normal)   Pulse 96   Ht 5\' 2"  (1.575 m)   Wt 152 lb (68.9 kg)   LMP 03/23/2016   BMI 27.80 kg/m  Body mass index is 27.8 kg/m.  Physical Examination: General appearance - alert, well appearing, and in no distress Pelvic - small amt white nonodorous d/c  Results for orders placed or performed in visit on 05/01/16 (from the past 24 hour(s))  POCT urine pregnancy   Collection Time: 05/01/16 11:13 AM  Result Value Ref Range   Preg Test, Ur Positive (A) Negative  POCT Wet Prep [facility] [place])   Collection Time: 05/01/16 12:55 PM  Result Value Ref Range   Source Wet Prep POC vaginal    WBC, Wet Prep HPF POC many    Bacteria Wet Prep HPF POC None (A) Few   BACTERIA WET PREP MORPHOLOGY POC     Clue Cells Wet Prep HPF POC None None   Clue Cells Wet Prep Whiff POC Negative Whiff    Yeast Wet Prep HPF POC None    KOH Wet Prep POC     Trichomonas Wet Prep HPF POC Absent Absent     Assessment & Plan:  A:   [id number] pregnant by uncertain  LMP  N/V  PTSD  Constipation  Reflux  Vaginal itching  P:  Rx diclegis, if insurance doesn't cover gave otc unisom/vit b 6 instructions, or can call for phenergan rx  Resume wellbutrin, stop minipress, linzess  Gave printed info on constipation prevention/relief measures  Can take antacids if needed, discussed least amt of medication at this time in pregnancy is best  Will send gc/ct, if neg, can try mycolog  Return in about 3 weeks (around 05/22/2016) for dating u/s and new ob intake w/ tish.  Tawnya Crook CNM, [facility] 05/01/2016 12:59 PM

## 2016-05-02 ENCOUNTER — Inpatient Hospital Stay (HOSPITAL_COMMUNITY): Payer: 59

## 2016-05-02 ENCOUNTER — Inpatient Hospital Stay (HOSPITAL_COMMUNITY)
Admission: AD | Admit: 2016-05-02 | Discharge: 2016-05-02 | Disposition: A | Discharge status: Home / Self Care | Payer: 59 | Source: Ambulatory Visit | Attending: Obstetrics and Gynecology | Admitting: Obstetrics and Gynecology

## 2016-05-02 ENCOUNTER — Encounter (HOSPITAL_COMMUNITY): Payer: Self-pay | Admitting: *Deleted

## 2016-05-02 DIAGNOSIS — O3411 Maternal care for benign tumor of corpus uteri, first trimester: Secondary | ICD-10-CM

## 2016-05-02 DIAGNOSIS — Z87891 Personal history of nicotine dependence: Secondary | ICD-10-CM | POA: Insufficient documentation

## 2016-05-02 DIAGNOSIS — O418X10 Other specified disorders of amniotic fluid and membranes, first trimester, not applicable or unspecified: Secondary | ICD-10-CM

## 2016-05-02 DIAGNOSIS — D259 Leiomyoma of uterus, unspecified: Secondary | ICD-10-CM | POA: Diagnosis not present

## 2016-05-02 DIAGNOSIS — R1030 Lower abdominal pain, unspecified: Secondary | ICD-10-CM | POA: Diagnosis present

## 2016-05-02 DIAGNOSIS — Z3A01 Less than 8 weeks gestation of pregnancy: Secondary | ICD-10-CM | POA: Insufficient documentation

## 2016-05-02 DIAGNOSIS — O219 Vomiting of pregnancy, unspecified: Secondary | ICD-10-CM

## 2016-05-02 DIAGNOSIS — O210 Mild hyperemesis gravidarum: Secondary | ICD-10-CM | POA: Diagnosis not present

## 2016-05-02 DIAGNOSIS — R109 Unspecified abdominal pain: Secondary | ICD-10-CM

## 2016-05-02 DIAGNOSIS — Z3491 Encounter for supervision of normal pregnancy, unspecified, first trimester: Secondary | ICD-10-CM

## 2016-05-02 DIAGNOSIS — O26899 Other specified pregnancy related conditions, unspecified trimester: Secondary | ICD-10-CM

## 2016-05-02 DIAGNOSIS — O468X1 Other antepartum hemorrhage, first trimester: Secondary | ICD-10-CM

## 2016-05-02 DIAGNOSIS — O26891 Other specified pregnancy related conditions, first trimester: Secondary | ICD-10-CM

## 2016-05-02 HISTORY — DX: Gastro-esophageal reflux disease without esophagitis: K21.9

## 2016-05-02 LAB — CBC
HCT: 35.2 % — ABNORMAL LOW (ref 36.0–46.0)
Hemoglobin: 11.5 g/dL — ABNORMAL LOW (ref 12.0–15.0)
MCH: 22.9 pg — AB (ref 26.0–34.0)
MCHC: 32.7 g/dL (ref 30.0–36.0)
MCV: 70 fL — AB (ref 78.0–100.0)
PLATELETS: 305 10*3/uL (ref 150–400)
RBC: 5.03 MIL/uL (ref 3.87–5.11)
RDW: 15.4 % (ref 11.5–15.5)
WBC: 4.4 10*3/uL (ref 4.0–10.5)

## 2016-05-02 LAB — URINALYSIS, ROUTINE W REFLEX MICROSCOPIC
BILIRUBIN URINE: NEGATIVE
GLUCOSE, UA: NEGATIVE mg/dL
HGB URINE DIPSTICK: NEGATIVE
KETONES UR: 15 mg/dL — AB
Leukocytes, UA: NEGATIVE
Nitrite: NEGATIVE
PH: 8.5 — AB (ref 5.0–8.0)
PROTEIN: NEGATIVE mg/dL
Specific Gravity, Urine: 1.015 (ref 1.005–1.030)

## 2016-05-02 LAB — HCG, QUANTITATIVE, PREGNANCY: hCG, Beta Chain, Quant, S: 158983 m[IU]/mL — ABNORMAL HIGH (ref <5)

## 2016-05-02 MED ORDER — PROMETHAZINE HCL 25 MG PO TABS: 25.0000 mg | ORAL_TABLET | Freq: Four times a day (QID) | ORAL | 0 refills | Status: DC | PRN

## 2016-05-02 MED ORDER — PROMETHAZINE HCL 25 MG/ML IJ SOLN
25.0000 mg | Freq: Once | INTRAMUSCULAR | Status: AC
  Administered 2016-05-02: 25 mg via INTRAMUSCULAR
  Filled 2016-05-02: qty 1

## 2016-05-02 NOTE — MAU Provider Note (Signed)
History     CSN: [id number]  Arrival date and time: 05/02/16 [id number]   First Provider Initiated Contact with Patient 05/02/16 2022        Chief Complaint  Patient presents with  . Abdominal Cramping  . Emesis During Pregnancy   HPI Charlotte Mccormick is a 30 y.o. G2P1 at [redacted]w[redacted]d who presents with abdominal cramping & n/v. Reports intermittent lower abdominal pain x 2 weeks. Describes pain as sharp & cramp like. Rates pain 6/10 when it comes. Last felt pain 30 minutes ago.  Reports nausea & vomiting for the last 2 weeks that has worsened in the last 2 days. Was told to take unisom by ob but states it hasn't helped. Reports not being able to keep down food or liquid for the last 2 days. Reports vomiting twice today & 7 times yesterday. Last tried to eat & drink at lunch time today but vomited within 15 minutes. Denies fever/chills, heartburn, vaginal bleeding, or vaginal discharge.   OB History    Gravida Para Term Preterm AB Living   2 1       1    SAB TAB Ectopic Multiple Live Births                  Past Medical History:  Diagnosis Date  . Abnormal uterine bleeding (AUB) 12/12/2015  . Anemia   . Anxiety   . Depression   . GERD (gastroesophageal reflux disease)   . PTSD (post-traumatic stress disorder)   . Sinusitis   . Vaginal dryness 12/12/2015    Past Surgical History:  Procedure Laterality Date  . COLONOSCOPY N/A 04/08/2016   Procedure: COLONOSCOPY;  Surgeon: Danie Binder, MD;  Location: [facility];  Service: Endoscopy;  Laterality: N/A;  10:15 AM  . ESOPHAGOGASTRODUODENOSCOPY N/A 04/08/2016   Procedure: ESOPHAGOGASTRODUODENOSCOPY (EGD);  Surgeon: Danie Binder, MD;  Location: [facility];  Service: Endoscopy;  Laterality: N/A;    Family History  Problem Relation Age of Onset  . Heart disease Mother   . Hypertension Mother   . Hypertension Father   . Hyperlipidemia Father   . Diabetes Father   . Eczema Daughter   . Colon cancer Neg Hx     Social History   Substance Use Topics  . Smoking status: Former Smoker    Packs/day: 0.50    Years: 5.00    Types: Cigarettes    Quit date: 06/03/2010  . Smokeless tobacco: Never Used  . Alcohol use No     Comment: 03/19/16 currently no ETOH due to meds; Previously: Wine three times a week    Allergies:  Allergies  Allergen Reactions  . Bee Venom Swelling  . Penicillins Itching         Prescriptions Prior to Admission  Medication Sig Dispense Refill Last Dose  . buPROPion (WELLBUTRIN SR) 100 MG 12 hr tablet Take 100 mg by mouth 2 (two) times daily.    Not Taking  . Doxylamine-Pyridoxine (DICLEGIS) 10-10 MG TBEC 2 tabs q hs, if sx persist add 1 tab q am on day 3, if sx persist add 1 tab q afternoon on day 4 100 tablet 6   . linaclotide (LINZESS) 72 MCG capsule Take 1 capsule (72 mcg total) by mouth daily before breakfast. (Patient not taking: Reported on 05/01/2016) 90 capsule 3 Not Taking  . Multiple Vitamin (MULTIVITAMIN WITH MINERALS) TABS tablet Take 1 tablet by mouth daily.   Not Taking  . Ondansetron HCl (ZOFRAN PO) Take  by mouth.   Taking  . pantoprazole (PROTONIX) 40 MG tablet Take 1 tablet (40 mg total) by mouth daily. (Patient not taking: Reported on 05/01/2016) 90 tablet 3 Not Taking  . prazosin (MINIPRESS) 2 MG capsule Take 2 mg by mouth at bedtime.   Not Taking  . Prenatal MV & Min w/FA-DHA (PRENATAL ADULT GUMMY/DHA/FA PO) Take by mouth.   Taking    Review of Systems  Constitutional: Negative.   Gastrointestinal: Positive for abdominal pain, constipation, nausea and vomiting. Negative for diarrhea and heartburn.  Genitourinary: Negative.    Physical Exam   Blood pressure 112/66, pulse 79, temperature 98.7 F (37.1 C), temperature source Oral, resp. rate 18, height 5\' 1"  (1.549 m), weight 151 lb 1.9 oz (68.5 kg), last menstrual period 03/23/2016.  Physical Exam  Nursing note and vitals reviewed. Constitutional: She is oriented to person, place, and time. She appears  well-developed and well-nourished. No distress.  HENT:  Head: Normocephalic and atraumatic.  Eyes: Conjunctivae are normal. Right eye exhibits no discharge. Left eye exhibits no discharge. No scleral icterus.  Neck: Normal range of motion.  Cardiovascular: Normal rate, regular rhythm and normal heart sounds.   No murmur heard. Respiratory: Effort normal and breath sounds normal. No respiratory distress. She has no wheezes.  GI: Soft. Bowel sounds are normal. She exhibits no distension. There is no tenderness. There is no rebound and no guarding.  Neurological: She is alert and oriented to person, place, and time.  Skin: Skin is warm and dry. She is not diaphoretic.  Psychiatric: She has a normal mood and affect. Her behavior is normal. Judgment and thought content normal.    MAU Course  Procedures Results for orders placed or performed during the hospital encounter of 05/02/16 (from the past 24 hour(s))  Urinalysis, Routine w reflex microscopic (not at [facility])     Status: Abnormal   Collection Time: 05/02/16  6:35 PM  Result Value Ref Range   Color, Urine YELLOW YELLOW   APPearance CLEAR CLEAR   Specific Gravity, Urine 1.015 1.005 - 1.030   pH 8.5 (H) 5.0 - 8.0   Glucose, UA NEGATIVE NEGATIVE mg/dL   Hgb urine dipstick NEGATIVE NEGATIVE   Bilirubin Urine NEGATIVE NEGATIVE   Ketones, ur 15 (A) NEGATIVE mg/dL   Protein, ur NEGATIVE NEGATIVE mg/dL   Nitrite NEGATIVE NEGATIVE   Leukocytes, UA NEGATIVE NEGATIVE  CBC     Status: Abnormal   Collection Time: 05/02/16  8:41 PM  Result Value Ref Range   WBC 4.4 4.0 - 10.5 K/uL   RBC 5.03 3.87 - 5.11 MIL/uL   Hemoglobin 11.5 (L) 12.0 - 15.0 g/dL   HCT 35.2 (L) 36.0 - 46.0 %   MCV 70.0 (L) 78.0 - 100.0 fL   MCH 22.9 (L) 26.0 - 34.0 pg   MCHC 32.7 30.0 - 36.0 g/dL   RDW 15.4 11.5 - 15.5 %   Platelets 305 150 - 400 K/uL  ABO/Rh     Status: None (Preliminary result)   Collection Time: 05/02/16  8:41 PM  Result Value Ref Range   ABO/RH(D)  B POS   hCG, quantitative, pregnancy     Status: Abnormal   Collection Time: 05/02/16  8:41 PM  Result Value Ref Range   hCG, Beta Chain, Quant, S 158,983 (H) <5 mIU/mL   US Ob Comp Less 14 Wks  Result Date: 05/02/2016 CLINICAL DATA:  Abdominal pain. Gestational age by last menstrual period 5 weeks and 5 days. EXAM: OBSTETRIC <  14 WK [place] AND TRANSVAGINAL OB US TECHNIQUE: Both transabdominal and transvaginal ultrasound examinations were performed for complete evaluation of the gestation as well as the maternal uterus, adnexal regions, and pelvic cul-de-sac. Transvaginal technique was performed to assess early pregnancy. COMPARISON:  None. FINDINGS: Intrauterine gestational sac: Present Yolk sac:  Present Embryo:  Present Cardiac Activity: Present Heart Rate: 127  bpm CRL:  7  mm   6 w   3 d                  [place] EDC: December 24, 2006 Subchorionic hemorrhage:  Small subchorionic hemorrhage. Maternal uterus/adnexae: Normal appearance of the adnexae. Isodense 4.7 cm probable intramural leiomyoma along the anterior uterine wall. No free fluid. IMPRESSION: Single live intrauterine pregnancy, gestational age by ultrasound 6 weeks and 3 days. Small subchorionic hemorrhage. 4.7 cm suspected anterior intramural leiomyoma. Electronically Signed   By: Elon Alas M.D.   On: 05/02/2016 21:24   US Ob Transvaginal  Result Date: 05/02/2016 CLINICAL DATA:  Abdominal pain. Gestational age by last menstrual period 5 weeks and 5 days. EXAM: OBSTETRIC <14 WK [place] AND TRANSVAGINAL OB US TECHNIQUE: Both transabdominal and transvaginal ultrasound examinations were performed for complete evaluation of the gestation as well as the maternal uterus, adnexal regions, and pelvic cul-de-sac. Transvaginal technique was performed to assess early pregnancy. COMPARISON:  None. FINDINGS: Intrauterine gestational sac: Present Yolk sac:  Present Embryo:  Present Cardiac Activity: Present Heart Rate: 127  bpm CRL:  7  mm   6 w   3 d                   [place] EDC: December 24, 2006 Subchorionic hemorrhage:  Small subchorionic hemorrhage. Maternal uterus/adnexae: Normal appearance of the adnexae. Isodense 4.7 cm probable intramural leiomyoma along the anterior uterine wall. No free fluid. IMPRESSION: Single live intrauterine pregnancy, gestational age by ultrasound 6 weeks and 3 days. Small subchorionic hemorrhage. 4.7 cm suspected anterior intramural leiomyoma. Electronically Signed   By: Elon Alas M.D.   On: 05/02/2016 21:24    MDM +UPT UA, CBC, ABO/Rh, quant hCG, HIV, and [place] today to rule out ectopic pregnancy Phenergan 25 mg IM Pt not observed vomiting in MAU GC/Ct & wet prep collected at [facility] yesterday; will defer today Ultrasound shows SIUP with cardiac activity, 4 cm fibroid, & small [place] Pt reports improvement in symptoms after meds  Assessment and Plan  A: 1. Normal IUP (intrauterine pregnancy) on prenatal ultrasound, first trimester   2. Abdominal cramping affecting pregnancy   3. Uterine fibroids affecting pregnancy in first trimester   4. Subchorionic hematoma in first trimester, single or unspecified fetus   5. Nausea and vomiting during pregnancy prior to [redacted] weeks gestation    P: Discharge home Rx phenergan Continue unisom & B6 as directed by ob Keep ob appointments Discussed reasons to return to [place] 05/02/2016, 8:22 PM

## 2016-05-02 NOTE — MAU Note (Signed)
Pt reports having abd cramping on and off for 1 week. C/o N/V  X 2 weeks.Pt was taking Zofran without relief. Went to [facility] yesterday and was told to take b6 and unisome. Took b6 without unisome.

## 2016-05-02 NOTE — Discharge Instructions (Signed)
Morning Sickness Morning sickness is when you feel sick to your stomach (nauseous) during pregnancy. This nauseous feeling may or may not come with vomiting. It often occurs in the morning but can be a problem any time of day. Morning sickness is most common during the first trimester, but it may continue throughout pregnancy. While morning sickness is unpleasant, it is usually harmless unless you develop severe and continual vomiting (hyperemesis gravidarum). This condition requires more intense treatment. What are the causes? The cause of morning sickness is not completely known but seems to be related to normal hormonal changes that occur in pregnancy. What increases the risk? You are at greater risk if you:  Experienced nausea or vomiting before your pregnancy.  Had morning sickness during a previous pregnancy.  Are pregnant with more than one baby, such as twins. How is this treated? Do not use any medicines (prescription, over-the-counter, or herbal) for morning sickness without first talking to your health care provider. Your health care provider may prescribe or recommend:  Vitamin B6 supplements.  Anti-nausea medicines.  The herbal medicine ginger. Follow these instructions at home:  Only take over-the-counter or prescription medicines as directed by your health care provider.  Taking multivitamins before getting pregnant can prevent or decrease the severity of morning sickness in most women.  Eat a piece of dry toast or unsalted crackers before getting out of bed in the morning.  Eat five or six small meals a day.  Eat dry and bland foods (rice, baked potato). Foods high in carbohydrates are often helpful.  Do not drink liquids with your meals. Drink liquids between meals.  Avoid greasy, fatty, and spicy foods.  Get someone to cook for you if the smell of any food causes nausea and vomiting.  If you feel nauseous after taking prenatal vitamins, take the vitamins at  night or with a snack.  Snack on protein foods (nuts, yogurt, cheese) between meals if you are hungry.  Eat unsweetened gelatins for desserts.  Wearing an acupressure wristband (worn for sea sickness) may be helpful.  Acupuncture may be helpful.  Do not smoke.  Get a humidifier to keep the air in your house free of odors.  Get plenty of fresh air. Contact a health care provider if:  Your home remedies are not working, and you need medicine.  You feel dizzy or lightheaded.  You are losing weight. Get help right away if:  You have persistent and uncontrolled nausea and vomiting.  You pass out (faint). This information is not intended to replace advice given to you by your health care provider. Make sure you discuss any questions you have with your health care provider. Document Released: 07/11/2006 Document Revised: 10/26/2015 Document Reviewed: 11/04/2012 Elsevier Interactive Patient Education  2017 Page Hematoma A subchorionic hematoma is a gathering of blood between the outer wall of the placenta and the inner wall of the womb (uterus). The placenta is the organ that connects the fetus to the wall of the uterus. The placenta performs the feeding, breathing (oxygen to the fetus), and waste removal (excretory work) of the fetus.  Subchorionic hematoma is the most common abnormality found on a result from ultrasonography done during the first trimester or early second trimester of pregnancy. If there has been little or no vaginal bleeding, early small hematomas usually shrink on their own and do not affect your baby or pregnancy. The blood is gradually absorbed over 1-2 weeks. When bleeding starts later in  pregnancy or the hematoma is larger or occurs in an older pregnant woman, the outcome may not be as good. Larger hematomas may get bigger, which increases the chances for miscarriage. Subchorionic hematoma also increases the risk of premature detachment of  the placenta from the uterus, preterm (premature) labor, and stillbirth. HOME CARE INSTRUCTIONS  Stay on bed rest if your health care provider recommends this. Although bed rest will not prevent more bleeding or prevent a miscarriage, your health care provider may recommend bed rest until you are advised otherwise.  Avoid heavy lifting (more than 10 lb [4.5 kg]), exercise, sexual intercourse, or douching as directed by your health care provider.  Keep track of the number of pads you use each day and how soaked (saturated) they are. Write down this information.  Do not use tampons.  Keep all follow-up appointments as directed by your health care provider. Your health care provider may ask you to have follow-up blood tests or ultrasound tests or both. SEEK IMMEDIATE MEDICAL CARE IF:  You have severe cramps in your stomach, back, abdomen, or pelvis.  You have a fever.  You pass large clots or tissue. Save any tissue for your health care provider to look at.  Your bleeding increases or you become lightheaded, feel weak, or have fainting episodes. This information is not intended to replace advice given to you by your health care provider. Make sure you discuss any questions you have with your health care provider. Document Released: 09/04/2006 Document Revised: 06/10/2014 Document Reviewed: 12/17/2012 Elsevier Interactive Patient Education  2017 [employer]         Uterine Fibroids Uterine fibroids are tissue masses (tumors). They are also called leiomyomas. They can develop inside of a womans womb (uterus). They can grow very large. Fibroids are not cancerous (benign). Most fibroids do not require medical treatment. Follow these instructions at home:  Keep all follow-up visits as told by your doctor. This is important.  Take medicines only as told by your doctor.  If you were prescribed a hormone treatment, take the hormone medicines exactly as told.  Do not take aspirin.  It can cause bleeding.  Ask your doctor about taking iron pills and increasing the amount of dark green, leafy vegetables in your diet. These actions can help to boost your blood iron levels.  Pay close attention to your period. Tell your doctor about any changes, such as:  Increased blood flow. This may require you to use more pads or tampons than usual per month.  A change in the number of days that your period lasts per month.  A change in symptoms that come with your period, such as back pain or cramping in your belly area (abdomen). Contact a doctor if:  You have pain in your back or the area between your hip bones (pelvic area) that is not controlled by medicines.  You have pain in your abdomen that is not controlled with medicines.  You have an increase in bleeding between and during periods.  You soak tampons or pads in a half hour or less.  You feel lightheaded.  You feel extra tired.  You feel weak. Get help right away if:  You pass out (faint).  You have a sudden increase in pelvic pain. This information is not intended to replace advice given to you by your health care provider. Make sure you discuss any questions you have with your health care provider. Document Released: 06/22/2010 Document Revised: 01/19/2016 Document Reviewed: 11/16/2013 Elsevier  Interactive Patient Education  2017 [employer].

## 2016-05-03 LAB — ABO/RH: ABO/RH(D): B POS

## 2016-05-03 LAB — GC/CHLAMYDIA PROBE AMP
Chlamydia trachomatis, NAA: NEGATIVE
Neisseria gonorrhoeae by PCR: NEGATIVE

## 2016-05-06 ENCOUNTER — Encounter: Payer: Self-pay | Admitting: Adult Health

## 2016-05-08 ENCOUNTER — Encounter (HOSPITAL_COMMUNITY): Payer: Self-pay | Admitting: *Deleted

## 2016-05-08 ENCOUNTER — Inpatient Hospital Stay (HOSPITAL_COMMUNITY)
Admission: AD | Admit: 2016-05-08 | Discharge: 2016-05-08 | Disposition: A | Discharge status: Home / Self Care | Payer: 59 | Source: Ambulatory Visit | Attending: Obstetrics and Gynecology | Admitting: Obstetrics and Gynecology

## 2016-05-08 ENCOUNTER — Encounter: Payer: Self-pay | Admitting: Women's Health

## 2016-05-08 DIAGNOSIS — O219 Vomiting of pregnancy, unspecified: Secondary | ICD-10-CM | POA: Diagnosis not present

## 2016-05-08 DIAGNOSIS — O210 Mild hyperemesis gravidarum: Secondary | ICD-10-CM | POA: Insufficient documentation

## 2016-05-08 DIAGNOSIS — Z87891 Personal history of nicotine dependence: Secondary | ICD-10-CM | POA: Insufficient documentation

## 2016-05-08 DIAGNOSIS — Z880 Allergy status to penicillin: Secondary | ICD-10-CM | POA: Insufficient documentation

## 2016-05-08 DIAGNOSIS — Z3A01 Less than 8 weeks gestation of pregnancy: Secondary | ICD-10-CM | POA: Diagnosis not present

## 2016-05-08 HISTORY — DX: Constipation, unspecified: K59.00

## 2016-05-08 HISTORY — DX: Benign neoplasm of connective and other soft tissue, unspecified: D21.9

## 2016-05-08 LAB — URINALYSIS, ROUTINE W REFLEX MICROSCOPIC
BILIRUBIN URINE: NEGATIVE
Bacteria, UA: NONE SEEN
Glucose, UA: NEGATIVE mg/dL
Hgb urine dipstick: NEGATIVE
Ketones, ur: 5 mg/dL — AB
Nitrite: NEGATIVE
PH: 6 (ref 5.0–8.0)
Protein, ur: 30 mg/dL — AB
SPECIFIC GRAVITY, URINE: 1.028 (ref 1.005–1.030)

## 2016-05-08 LAB — RAPID URINE DRUG SCREEN, HOSP PERFORMED
AMPHETAMINES: NOT DETECTED
BENZODIAZEPINES: NOT DETECTED
Barbiturates: NOT DETECTED
COCAINE: NOT DETECTED
OPIATES: NOT DETECTED
TETRAHYDROCANNABINOL: NOT DETECTED

## 2016-05-08 LAB — GLUCOSE, CAPILLARY: GLUCOSE-CAPILLARY: 71 mg/dL (ref 65–99)

## 2016-05-08 MED ORDER — LIDOCAINE HCL (PF) 1 % IJ SOLN
INTRAMUSCULAR | Status: AC
  Filled 2016-05-08: qty 5

## 2016-05-08 MED ORDER — ONDANSETRON 8 MG PO TBDP
8.0000 mg | ORAL_TABLET | Freq: Once | ORAL | Status: AC
  Administered 2016-05-08: 8 mg via ORAL
  Filled 2016-05-08: qty 1

## 2016-05-08 MED ORDER — ONDANSETRON 8 MG PO TBDP: 8.0000 mg | ORAL_TABLET | Freq: Three times a day (TID) | ORAL | 1 refills | Status: DC | PRN

## 2016-05-08 MED ORDER — LACTATED RINGERS IV BOLUS (SEPSIS): 1000.0000 mL | Freq: Once | INTRAVENOUS | Status: DC

## 2016-05-08 NOTE — MAU Note (Signed)
Arrived in Pt's room to introduce myself. Pt was found lying on the floor. Resident and Dr. Vanetta Shawl called to the bedside. Pt is responsive, but talking in a very quiet voice. Pt back to bed with the help of staff. Pt states that she feels very week. Denies Pain.

## 2016-05-08 NOTE — MAU Provider Note (Signed)
Chief Complaint: No chief complaint on file.   SUBJECTIVE HPI: Charlotte Mccormick is a 30 y.o. G2P1 at [redacted]w[redacted]d who presents to [facility] reporting Intractable nausea/vomiting.  7:25 PM - called into patient room by nurse, patient was found fully clothed on the floor. Patient states she feels "so weak". Does not remember how she got on the floor. Patient was mumbling her words, not speaking clearly. She would not attempt to help get her up to bed. Required multiple staff members to carry her to bed. Kept saying she feels so weak. Her vitals were stable, with a BP of 126/99 and repeat 135/91, HR 97 > 87. CBG was 71. Patient was not cooperative, not answering questions, would not attempt to even follow any commands. Attempting to get IV access to get stat blood labs and IVF started. Urinalysis reviewed, no sign of dehydration or UTI, but was dirty catch.   UDS returned and was negative.   Returned to patient's room and patient was only slightly attempting to answer questions. She states she has been vomiting for 2 weeks, unable to hold down fluids or food. She has attempted phenergan, Unisom/B6 without relief of vomiting. She denies any pain. Denies any vaginal bleeding. Patient would not answer any further questions.      Past Medical History:  Diagnosis Date  . Abnormal uterine bleeding (AUB) 12/12/2015  . Anemia   . Anxiety   . Constipation   . Depression   . Fibroid   . GERD (gastroesophageal reflux disease)   . PTSD (post-traumatic stress disorder)   . Sinusitis   . Vaginal dryness 12/12/2015   OB History  Gravida Para Term Preterm AB Living  2 1       1   SAB TAB Ectopic Multiple Live Births               # Outcome Date GA Lbr Len/2nd Weight Sex Delivery Anes PTL Lv  2 Current           1 Para              Past Surgical History:  Procedure Laterality Date  . COLONOSCOPY N/A 04/08/2016   Procedure: COLONOSCOPY;  Surgeon: Danie Binder, MD;  [facility];   Service: Endoscopy;  Laterality: N/A;  10:15 AM  . ESOPHAGOGASTRODUODENOSCOPY N/A 04/08/2016   Procedure: ESOPHAGOGASTRODUODENOSCOPY (EGD);  Surgeon: Danie Binder, MD;  Location: [facility];  Service: Endoscopy;  Laterality: N/A;   Social History   Social History  . Marital status: Single    Spouse name: N/A  . Number of children: N/A  . Years of education: N/A   Occupational History  . Not on file.   Social History Main Topics  . Smoking status: Former Smoker    Packs/day: 0.50    Years: 5.00    Types: Cigarettes    Quit date: 06/03/2010  . Smokeless tobacco: Never Used  . Alcohol use No     Comment: 03/19/16 currently no ETOH due to meds; Previously: Wine three times a week  . Drug use: No  . Sexual activity: Yes    Birth control/ protection: None   Other Topics Concern  . Not on file   Social History Narrative  . No narrative on file   No current facility-administered medications on file prior to encounter.    Current Outpatient Prescriptions on File Prior to Encounter  Medication Sig Dispense Refill  . Doxylamine-Pyridoxine (DICLEGIS) 10-10 MG TBEC 2 tabs  q hs, if sx persist add 1 tab q am on day 3, if sx persist add 1 tab q afternoon on day 4 (Patient not taking: Reported on 05/02/2016) 100 tablet 6  . Prenatal MV-Min-FA-Omega-3 (PRENATAL GUMMIES/DHA & FA) 0.4-32.5 MG CHEW Chew 2 each by mouth at bedtime.    . promethazine (PHENERGAN) 25 MG tablet Take 1 tablet (25 mg total) by mouth every 6 (six) hours as needed for nausea or vomiting. 30 tablet 0  . pyridOXINE (VITAMIN B-6) 100 MG tablet Take 100 mg by mouth 2 (two) times daily.    . ranitidine (ZANTAC) 75 MG tablet Take 75 mg by mouth 2 (two) times daily as needed for heartburn.     Allergies  Allergen Reactions  . Bee Venom Other (See Comments)    Reaction:  Localized swelling   . Penicillins Itching and Other (See Comments)    Has patient had a PCN reaction causing immediate rash, facial/tongue/throat  swelling, SOB or lightheadedness with hypotension: No Has patient had a PCN reaction causing severe rash involving mucus membranes or skin necrosis: No Has patient had a PCN reaction that required hospitalization No Has patient had a PCN reaction occurring within the last 10 years: Yes If all of the above answers are "NO", then may proceed with Cephalosporin use.     I have reviewed the past Medical Hx, Surgical Hx, Social Hx, Allergies and Medications.   REVIEW OF SYSTEMS  A comprehensive ROS was negative except per HPI.      OBJECTIVE Patient Vitals for the past 24 hrs:  BP Temp Temp src Pulse Resp Weight  05/08/16 1821 119/68 98.7 F (37.1 C) Oral 75 16 151 lb 4 oz (68.6 kg)    PHYSICAL EXAM Constitutional: Well-developed, well-nourished female in no acute distress but acting weak and lethargic. VSS Cardiovascular: normal rate, rhythm, no murmurs. EKG normal. Respiratory: normal rate and effort, CTAB.  GI: Abd soft, non-tender, non-distended. Pos BS x 4 MS: Extremities nontender, no edema, normal ROM Neurologic: Sensation intact. Poor/minimal effort to assess neurologic function. When patient arrived, she was able to walk to the bathroom and the exam room without difficulty. GU: Neg CVAT. No suprapubic tenderness to palpation.   LAB RESULTS Results for orders placed or performed during the hospital encounter of 05/08/16 (from the past 24 hour(s))  Urinalysis, Routine w reflex microscopic     Status: Abnormal   Collection Time: 05/08/16  6:31 PM  Result Value Ref Range   Color, Urine YELLOW YELLOW   APPearance HAZY (A) CLEAR   Specific Gravity, Urine 1.028 1.005 - 1.030   pH 6.0 5.0 - 8.0   Glucose, UA NEGATIVE NEGATIVE mg/dL   Hgb urine dipstick NEGATIVE NEGATIVE   Bilirubin Urine NEGATIVE NEGATIVE   Ketones, ur 5 (A) NEGATIVE mg/dL   Protein, ur 30 (A) NEGATIVE mg/dL   Nitrite NEGATIVE NEGATIVE   Leukocytes, UA SMALL (A) NEGATIVE   RBC / HPF 0-5 0 - 5 RBC/hpf    WBC, UA 6-30 0 - 5 WBC/hpf   Bacteria, UA NONE SEEN NONE SEEN   Squamous Epithelial / LPF 6-30 (A) NONE SEEN   Mucous PRESENT   Glucose, capillary     Status: None   Collection Time: 05/08/16  7:25 PM  Result Value Ref Range   Glucose-Capillary 71 65 - 99 mg/dL    IMAGING US Ob Comp Less 14 Wks  Result Date: 05/02/2016 CLINICAL DATA:  Abdominal pain. Gestational age by last menstrual period 5  weeks and 5 days. EXAM: OBSTETRIC <14 WK [place] AND TRANSVAGINAL OB US TECHNIQUE: Both transabdominal and transvaginal ultrasound examinations were performed for complete evaluation of the gestation as well as the maternal uterus, adnexal regions, and pelvic cul-de-sac. Transvaginal technique was performed to assess early pregnancy. COMPARISON:  None. FINDINGS: Intrauterine gestational sac: Present Yolk sac:  Present Embryo:  Present Cardiac Activity: Present Heart Rate: 127  bpm CRL:  7  mm   6 w   3 d                  [place] EDC: December 24, 2006 Subchorionic hemorrhage:  Small subchorionic hemorrhage. Maternal uterus/adnexae: Normal appearance of the adnexae. Isodense 4.7 cm probable intramural leiomyoma along the anterior uterine wall. No free fluid. IMPRESSION: Single live intrauterine pregnancy, gestational age by ultrasound 6 weeks and 3 days. Small subchorionic hemorrhage. 4.7 cm suspected anterior intramural leiomyoma. Electronically Signed   By: Elon Alas M.D.   On: 05/02/2016 21:24   US Ob Transvaginal  Result Date: 05/02/2016 CLINICAL DATA:  Abdominal pain. Gestational age by last menstrual period 5 weeks and 5 days. EXAM: OBSTETRIC <14 WK [place] AND TRANSVAGINAL OB US TECHNIQUE: Both transabdominal and transvaginal ultrasound examinations were performed for complete evaluation of the gestation as well as the maternal uterus, adnexal regions, and pelvic cul-de-sac. Transvaginal technique was performed to assess early pregnancy. COMPARISON:  None. FINDINGS: Intrauterine gestational sac: Present Yolk sac:   Present Embryo:  Present Cardiac Activity: Present Heart Rate: 127  bpm CRL:  7  mm   6 w   3 d                  [place] EDC: December 24, 2006 Subchorionic hemorrhage:  Small subchorionic hemorrhage. Maternal uterus/adnexae: Normal appearance of the adnexae. Isodense 4.7 cm probable intramural leiomyoma along the anterior uterine wall. No free fluid. IMPRESSION: Single live intrauterine pregnancy, gestational age by ultrasound 6 weeks and 3 days. Small subchorionic hemorrhage. 4.7 cm suspected anterior intramural leiomyoma. Electronically Signed   By: Elon Alas M.D.   On: 05/02/2016 21:24    MAU COURSE EKG - NSR, normal CBC/CMP/LA - pending CBG 71 IVF bolus Zofran  9:00PM - signed out patient to Marcille Buffy, CNM, who will be resuming care for the patient.   Yolanda Bonine, DO - OB Fellow   MDM Plan of care reviewed with patient, including labs and tests ordered and medical treatment. 2155: Patient has had zofran, and she is tolerating po. She states that she wants to go home now. She is refusing any further IV sticks at this time. Discussed risks of zofran. Patient would like rx to have to take on a limited basis.   ASSESSMENT/PLAN 1. Nausea/vomiting in pregnancy    DC home Comfort measures reviewed  1st Trimester precautions  RX: zofran PRN #20  Return to MAU as needed FU with OB as planned  Follow-up Information    [facility] Follow up.   Specialty:  Obstetrics and Gynecology Contact information: [address] [place], [place]  10:00 PM 05/08/16

## 2016-05-08 NOTE — MAU Note (Signed)
States no BM for 3 weeks. States she took Linzess prior to pregnancy for bowel issues.

## 2016-05-08 NOTE — Discharge Instructions (Signed)
Constipation, Adult Constipation is when a person:  Poops (has a bowel movement) fewer times in a week than normal.  Has a hard time pooping.  Has poop that is dry, hard, or bigger than normal. Follow these instructions at home: Eating and drinking  Eat foods that have a lot of fiber, such as:  Fresh fruits and vegetables.  Whole grains.  Beans.  Eat less of foods that are high in fat, low in fiber, or overly processed, such as:  [place] fries.  Hamburgers.  Cookies.  Candy.  Soda.  Drink enough fluid to keep your pee (urine) clear or pale yellow. General instructions  Exercise regularly or as told by your doctor.  Go to the restroom when you feel like you need to poop. Do not hold it in.  Take over-the-counter and prescription medicines only as told by your doctor. These include any fiber supplements.  Do pelvic floor retraining exercises, such as:  Doing deep breathing while relaxing your lower belly (abdomen).  Relaxing your pelvic floor while pooping.  Watch your condition for any changes.  Keep all follow-up visits as told by your doctor. This is important. Contact a doctor if:  You have pain that gets worse.  You have a fever.  You have not pooped for 4 days.  You throw up (vomit).  You are not hungry.  You lose weight.  You are bleeding from the anus.  You have thin, pencil-like poop (stool). Get help right away if:  You have a fever, and your symptoms suddenly get worse.  You leak poop or have blood in your poop.  Your belly feels hard or bigger than normal (is bloated).  You have very bad belly pain.  You feel dizzy or you faint. This information is not intended to replace advice given to you by your health care provider. Make sure you discuss any questions you have with your health care provider. Document Released: 11/06/2007 Document Revised: 12/08/2015 Document Reviewed: 11/08/2015 Elsevier Interactive Patient Education  2017  [employer] Morning Sickness Morning sickness is when you feel sick to your stomach (nauseous) during pregnancy. You may feel sick to your stomach and throw up (vomit). You may feel sick in the morning, but you can feel this way any time of day. Some women feel very sick to their stomach and cannot stop throwing up (hyperemesis gravidarum). Follow these instructions at home:  Only take medicines as told by your doctor.  Take multivitamins as told by your doctor. Taking multivitamins before getting pregnant can stop or lessen the harshness of morning sickness.  Eat dry toast or unsalted crackers before getting out of bed.  Eat 5 to 6 small meals a day.  Eat dry and bland foods like rice and baked potatoes.  Do not drink liquids with meals. Drink between meals.  Do not eat greasy, fatty, or spicy foods.  Have someone cook for you if the smell of food causes you to feel sick or throw up.  If you feel sick to your stomach after taking prenatal vitamins, take them at night or with a snack.  Eat protein when you need a snack (nuts, yogurt, cheese).  Eat unsweetened gelatins for dessert.  Wear a bracelet used for sea sickness (acupressure wristband).  Go to a doctor that puts thin needles into certain body points (acupuncture) to improve how you feel.  Do not smoke.  Use a humidifier to keep the air in your house free of odors.  Get lots  of fresh air. Contact a doctor if:  You need medicine to feel better.  You feel dizzy or lightheaded.  You are losing weight. Get help right away if:  You feel very sick to your stomach and cannot stop throwing up.  You pass out (faint). This information is not intended to replace advice given to you by your health care provider. Make sure you discuss any questions you have with your health care provider. Document Released: 06/27/2004 Document Revised: 10/26/2015 Document Reviewed: 11/04/2012 Elsevier Interactive Patient Education  2017  [employer].

## 2016-05-08 NOTE — MAU Note (Signed)
Still not able to hold food down, even with the medicine that was gave her.  Saw some blood in the throw up today.  Feels real weak. Pressure in pelvis, almost like how she felt when she was 59mon preg.  Still hasn't used the bathroom, has been like 3 wks

## 2016-05-09 ENCOUNTER — Other Ambulatory Visit: Payer: Self-pay | Admitting: Women's Health

## 2016-05-09 ENCOUNTER — Telehealth: Payer: Self-pay | Admitting: Women's Health

## 2016-05-09 MED ORDER — PROMETHAZINE HCL 25 MG RE SUPP: 25.0000 mg | Freq: Four times a day (QID) | RECTAL | 0 refills | Status: DC | PRN

## 2016-05-09 NOTE — Telephone Encounter (Signed)
Patient called requesting a note to be absent from work today and tomorrow. She was seen at [facility] hospital last night for dehydration. She would like to return to work on Monday. Note completed and signed. Patient to pick up on Monday 12/11.

## 2016-05-09 NOTE — Progress Notes (Signed)
Pt e-mailed, otc unisom/vit b 6 and phenergan not helping for n/v, rx phenergan suppositories- not to take suppository and po phenergan together. If still unable to keep food/fluids down/getting dehydrated to call or go to hospital for IVF/meds.  [name], CNM, [facility] 05/09/2016 9:14 AM

## 2016-05-09 NOTE — Progress Notes (Signed)
CRNA unavailable to attempt IV start. Phlebotomist unable to obtain blood after several attempts. Pt refusing to attempt PO intake.

## 2016-05-10 ENCOUNTER — Telehealth: Payer: Self-pay | Admitting: *Deleted

## 2016-05-10 NOTE — Telephone Encounter (Signed)
Received a PA for Diclegis for this pt, called pt and confirmed she had the Phenergan and Zofran for the n/v. Pt states she does have Zofran and the Phenergan suppositories for n/v and does not need Diclegis at this time.

## 2016-05-22 ENCOUNTER — Ambulatory Visit (INDEPENDENT_AMBULATORY_CARE_PROVIDER_SITE_OTHER): Payer: 59 | Admitting: *Deleted

## 2016-05-22 ENCOUNTER — Encounter: Payer: Self-pay | Admitting: *Deleted

## 2016-05-22 ENCOUNTER — Other Ambulatory Visit: Payer: 59

## 2016-05-22 VITALS — BP 120/88 | HR 85 | Wt 147.0 lb

## 2016-05-22 DIAGNOSIS — Z3481 Encounter for supervision of other normal pregnancy, first trimester: Secondary | ICD-10-CM

## 2016-05-22 DIAGNOSIS — Z029 Encounter for administrative examinations, unspecified: Secondary | ICD-10-CM

## 2016-05-22 DIAGNOSIS — Z3A01 Less than 8 weeks gestation of pregnancy: Secondary | ICD-10-CM | POA: Diagnosis not present

## 2016-05-22 DIAGNOSIS — Z1389 Encounter for screening for other disorder: Secondary | ICD-10-CM

## 2016-05-22 DIAGNOSIS — Z3480 Encounter for supervision of other normal pregnancy, unspecified trimester: Secondary | ICD-10-CM | POA: Insufficient documentation

## 2016-05-22 DIAGNOSIS — Z331 Pregnant state, incidental: Secondary | ICD-10-CM

## 2016-05-22 LAB — POCT URINALYSIS DIPSTICK
Blood, UA: NEGATIVE
Glucose, UA: NEGATIVE
Ketones, UA: NEGATIVE
LEUKOCYTES UA: NEGATIVE
NITRITE UA: NEGATIVE
PROTEIN UA: NEGATIVE

## 2016-05-22 NOTE — Progress Notes (Signed)
Charlotte Mccormick is a 30 y.o. G81P1001 female here today for initial OB intake/educational visit with RN  Patient's medical, surgical, and obstetrical history obtained and reviewed.  Current medications and allergies also reviewed.   Dating ultrasound 11/30 revealed GA of [redacted]wk3d based on LMP. EDC 12/28/16   Wt 147 lb (66.7 kg)   LMP 03/23/2016   BMI 27.78 kg/m   Patient Active Problem List   Diagnosis Date Noted  . Encounter for supervision of other normal pregnancy 05/22/2016  . PTSD (post-traumatic stress disorder) 05/01/2016  . Pregnant 05/01/2016  . Heme positive stool 03/19/2016  . Gastroesophageal reflux disease 01/16/2016  . Constipation 01/16/2016  . Black stools 01/16/2016  . Abnormal uterine bleeding (AUB) 12/12/2015   Past Medical History:  Diagnosis Date  . Abnormal uterine bleeding (AUB) 12/12/2015  . Anemia   . Anxiety   . Constipation   . Depression   . Fibroid   . GERD (gastroesophageal reflux disease)   . PTSD (post-traumatic stress disorder)   . Sinusitis   . Vaginal dryness 12/12/2015   Past Surgical History:  Procedure Laterality Date  . COLONOSCOPY N/A 04/08/2016   Procedure: COLONOSCOPY;  Surgeon: Danie Binder, MD;  [facility];  Service: Endoscopy;  Laterality: N/A;  10:15 AM  . ESOPHAGOGASTRODUODENOSCOPY N/A 04/08/2016   Procedure: ESOPHAGOGASTRODUODENOSCOPY (EGD);  Surgeon: Danie Binder, MD;  Location: [facility];  Service: Endoscopy;  Laterality: N/A;   OB History    Gravida Para Term Preterm AB Living   2 1 1     1    SAB TAB Ectopic Multiple Live Births           1      She is taking prenatal vitamins PN1 labs drawn Baby scripts and mychart activated  Reviewed recommended weight gain based on pre-gravid BMI  Genetic Screening discussed Integrated Screen: requested Cystic fibrosis screening discussed declined  Face-to-face time at least 30 minutes. 50% or more of this visit was spent in counseling and coordination of  care.  Return in about 4 weeks (around 06/19/2016) for New OB.   Federico Flake RN-C 05/22/2016 4:26 PM

## 2016-05-23 ENCOUNTER — Encounter (HOSPITAL_COMMUNITY): Payer: Self-pay | Admitting: Emergency Medicine

## 2016-05-23 DIAGNOSIS — O219 Vomiting of pregnancy, unspecified: Secondary | ICD-10-CM | POA: Insufficient documentation

## 2016-05-23 DIAGNOSIS — Z79899 Other long term (current) drug therapy: Secondary | ICD-10-CM | POA: Insufficient documentation

## 2016-05-23 DIAGNOSIS — Z87891 Personal history of nicotine dependence: Secondary | ICD-10-CM | POA: Insufficient documentation

## 2016-05-23 DIAGNOSIS — Z3A09 9 weeks gestation of pregnancy: Secondary | ICD-10-CM | POA: Diagnosis not present

## 2016-05-23 DIAGNOSIS — O99611 Diseases of the digestive system complicating pregnancy, first trimester: Secondary | ICD-10-CM | POA: Insufficient documentation

## 2016-05-23 DIAGNOSIS — K5900 Constipation, unspecified: Secondary | ICD-10-CM | POA: Insufficient documentation

## 2016-05-23 LAB — URINALYSIS, ROUTINE W REFLEX MICROSCOPIC
BILIRUBIN UA: NEGATIVE
GLUCOSE, UA: NEGATIVE
LEUKOCYTES UA: NEGATIVE
Nitrite, UA: NEGATIVE
PH UA: 7 (ref 5.0–7.5)
RBC, UA: NEGATIVE
Specific Gravity, UA: >=1.03 — AB (ref 1.005–1.030)
UUROB: 1 mg/dL (ref 0.2–1.0)

## 2016-05-23 LAB — PMP SCREEN PROFILE (10S), URINE
Amphetamine Screen, Ur: NEGATIVE ng/mL
BARBITURATE SCRN UR: NEGATIVE ng/mL
BENZODIAZEPINE SCREEN, URINE: NEGATIVE ng/mL
CREATININE(CRT), U: 269.6 mg/dL (ref 20.0–300.0)
Cannabinoids Ur Ql Scn: NEGATIVE ng/mL
Cocaine(Metab.)Screen, Urine: NEGATIVE ng/mL
METHADONE SCREEN, URINE: NEGATIVE ng/mL
Opiate Scrn, Ur: NEGATIVE ng/mL
Oxycodone+Oxymorphone Ur Ql Scn: NEGATIVE ng/mL
PCP Scrn, Ur: NEGATIVE ng/mL
PROPOXYPHENE SCREEN: NEGATIVE ng/mL
Ph of Urine: 6.9 (ref 4.5–8.9)

## 2016-05-23 LAB — RUBELLA SCREEN: RUBELLA: 12.9 {index} (ref >0.99)

## 2016-05-23 LAB — CBC
HEMOGLOBIN: 11.6 g/dL (ref 11.1–15.9)
Hematocrit: 38.3 % (ref 34.0–46.6)
MCH: 21.8 pg — AB (ref 26.6–33.0)
MCHC: 30.3 g/dL — ABNORMAL LOW (ref 31.5–35.7)
MCV: 72 fL — ABNORMAL LOW (ref 79–97)
PLATELETS: 357 10*3/uL (ref 150–379)
RBC: 5.32 x10E6/uL — AB (ref 3.77–5.28)
RDW: 16.4 % — ABNORMAL HIGH (ref 12.3–15.4)
WBC: 4.6 10*3/uL (ref 3.4–10.8)

## 2016-05-23 LAB — VARICELLA ZOSTER ANTIBODY, IGG

## 2016-05-23 LAB — ANTIBODY SCREEN: ANTIBODY SCREEN: NEGATIVE

## 2016-05-23 LAB — HIV ANTIBODY (ROUTINE TESTING W REFLEX): HIV SCREEN 4TH GENERATION: NONREACTIVE

## 2016-05-23 LAB — RPR: RPR Ser Ql: NONREACTIVE

## 2016-05-23 LAB — ABO/RH: RH TYPE: POSITIVE

## 2016-05-23 LAB — SICKLE CELL SCREEN: SICKLE CELL SCREEN: NEGATIVE

## 2016-05-23 LAB — HEPATITIS B SURFACE ANTIGEN: HEP B S AG: NEGATIVE

## 2016-05-23 NOTE — ED Triage Notes (Signed)
Pt reports emesis that started yesterday. Pt states her gyn wanted her "digestive tract checked out." Pt reports only 1 BM per week. Pt says she has had a colonoscopy and she had polyps.

## 2016-05-24 ENCOUNTER — Emergency Department (HOSPITAL_COMMUNITY)
Admission: EM | Admit: 2016-05-24 | Discharge: 2016-05-24 | Disposition: A | Discharge status: Home / Self Care | Payer: 59 | Attending: Emergency Medicine | Admitting: Emergency Medicine

## 2016-05-24 DIAGNOSIS — O219 Vomiting of pregnancy, unspecified: Secondary | ICD-10-CM

## 2016-05-24 LAB — URINE CULTURE

## 2016-05-24 LAB — COMPREHENSIVE METABOLIC PANEL
ALBUMIN: 4.4 g/dL (ref 3.5–5.0)
ALT: 16 U/L (ref 14–54)
AST: 18 U/L (ref 15–41)
Alkaline Phosphatase: 65 U/L (ref 38–126)
Anion gap: 9 (ref 5–15)
BUN: 9 mg/dL (ref 6–20)
CHLORIDE: 105 mmol/L (ref 101–111)
CO2: 22 mmol/L (ref 22–32)
CREATININE: 0.64 mg/dL (ref 0.44–1.00)
Calcium: 10.1 mg/dL (ref 8.9–10.3)
GFR calc Af Amer: >60 mL/min (ref >60)
GLUCOSE: 76 mg/dL (ref 65–99)
Potassium: 3.3 mmol/L — ABNORMAL LOW (ref 3.5–5.1)
Sodium: 136 mmol/L (ref 135–145)
Total Bilirubin: 0.6 mg/dL (ref 0.3–1.2)
Total Protein: 8.9 g/dL — ABNORMAL HIGH (ref 6.5–8.1)

## 2016-05-24 LAB — CBC
HCT: 36.7 % (ref 36.0–46.0)
Hemoglobin: 11.7 g/dL — ABNORMAL LOW (ref 12.0–15.0)
MCH: 22.7 pg — AB (ref 26.0–34.0)
MCHC: 31.9 g/dL (ref 30.0–36.0)
MCV: 71.3 fL — AB (ref 78.0–100.0)
PLATELETS: 307 10*3/uL (ref 150–400)
RBC: 5.15 MIL/uL — ABNORMAL HIGH (ref 3.87–5.11)
RDW: 15.6 % — AB (ref 11.5–15.5)
WBC: 5.3 10*3/uL (ref 4.0–10.5)

## 2016-05-24 LAB — GC/CHLAMYDIA PROBE AMP
Chlamydia trachomatis, NAA: NEGATIVE
NEISSERIA GONORRHOEAE BY PCR: NEGATIVE

## 2016-05-24 LAB — URINALYSIS, ROUTINE W REFLEX MICROSCOPIC
Bilirubin Urine: NEGATIVE
GLUCOSE, UA: NEGATIVE mg/dL
Hgb urine dipstick: NEGATIVE
Ketones, ur: 80 mg/dL — AB
LEUKOCYTES UA: NEGATIVE
Nitrite: NEGATIVE
PH: 5 (ref 5.0–8.0)
Protein, ur: 30 mg/dL — AB
SPECIFIC GRAVITY, URINE: 1.033 — AB (ref 1.005–1.030)

## 2016-05-24 LAB — PREGNANCY, URINE: Preg Test, Ur: POSITIVE — AB

## 2016-05-24 LAB — LIPASE, BLOOD: LIPASE: 23 U/L (ref 11–51)

## 2016-05-24 MED ORDER — PROMETHAZINE HCL 25 MG PO TABS: 25.0000 mg | ORAL_TABLET | Freq: Four times a day (QID) | ORAL | 0 refills | Status: DC | PRN

## 2016-05-24 MED ORDER — SODIUM CHLORIDE 0.9 % IV BOLUS (SEPSIS)
1000.0000 mL | Freq: Once | INTRAVENOUS | Status: AC
  Administered 2016-05-24: 1000 mL via INTRAVENOUS

## 2016-05-24 MED ORDER — PROMETHAZINE HCL 25 MG/ML IJ SOLN
25.0000 mg | Freq: Once | INTRAMUSCULAR | Status: AC
  Administered 2016-05-24: 25 mg via INTRAVENOUS
  Filled 2016-05-24: qty 1

## 2016-05-24 NOTE — Discharge Instructions (Signed)
Follow up with your OB doctor. Return to the ED if you develop new or worsening symptoms.

## 2016-05-24 NOTE — ED Notes (Signed)
Unable to hear fetal heart tones with doppler, edp notified.

## 2016-05-24 NOTE — ED Provider Notes (Signed)
[name] Note   CSN: [id number] Arrival date & time: 05/23/16  2212   By signing my name below, I, Charlotte Mccormick, attest that this documentation has been prepared under the direction and in the presence of [name], MD . Electronically Signed: Dolores Mccormick, Scribe. 05/24/2016. 12:58 AM.  History   Chief Complaint Chief Complaint  Patient presents with  . Emesis   The history is provided by the patient. No language interpreter was used.   HPI Comments:  Charlotte Mccormick is a pregnant (9 weeks) 30 y.o. female with pmhx of GERD and PTSD who presents to the Emergency Department complaining of intermittent unchanged vomiting beginning yesterday, 05/22/2016. Pt state she typically vomits 2-3x a day due to her pregnancy, but has vomited 10-11x. Pt has tried Zofran with minimal relief. She reports associated recurrent constipation (1 BM x week) and hematemesis and described the blood in her vomit as coating her toilet. She denies any back pain, dysuria, CP, SOB, or abdominal pain. Pt also denies any alcohol use or aspirin regimen. She has had a colonoscopy in the past where she was diagnosed with intestinal polyps.   Past Medical History:  Diagnosis Date  . Abnormal uterine bleeding (AUB) 12/12/2015  . Anemia   . Anxiety   . Constipation   . Depression   . Fibroid   . Genital herpes   . GERD (gastroesophageal reflux disease)   . History of chlamydia   . PTSD (post-traumatic stress disorder)   . Sinusitis   . Vaginal dryness 12/12/2015    Patient Active Problem List   Diagnosis Date Noted  . Encounter for supervision of other normal pregnancy 05/22/2016  . PTSD (post-traumatic stress disorder) 05/01/2016  . Pregnant 05/01/2016  . Heme positive stool 03/19/2016  . Gastroesophageal reflux disease 01/16/2016  . Constipation 01/16/2016  . Black stools 01/16/2016  . Abnormal uterine bleeding (AUB) 12/12/2015    Past Surgical History:  Procedure Laterality  Date  . COLONOSCOPY N/A 04/08/2016   Procedure: COLONOSCOPY;  Surgeon: Danie Binder, MD;  Location: [facility];  Service: Endoscopy;  Laterality: N/A;  10:15 AM  . ESOPHAGOGASTRODUODENOSCOPY N/A 04/08/2016   Procedure: ESOPHAGOGASTRODUODENOSCOPY (EGD);  Surgeon: Danie Binder, MD;  Location: [facility];  Service: Endoscopy;  Laterality: N/A;    OB History    Gravida Para Term Preterm AB Living   2 1 1     1    SAB TAB Ectopic Multiple Live Births           1       Home Medications    Prior to Admission medications   Medication Sig Start Date End Date Taking? Authorizing Provider  buPROPion (WELLBUTRIN) 100 MG tablet Take 100 mg by mouth daily.     Historical Provider, MD  doxylamine, Sleep, (UNISOM) 25 MG tablet Take 25 mg by mouth at bedtime as needed.    Historical Provider, MD  ondansetron (ZOFRAN ODT) 8 MG disintegrating tablet Take 1 tablet (8 mg total) by mouth every 8 (eight) hours as needed for nausea or vomiting. 05/08/16   [name], CNM  Prenatal MV-Min-FA-Omega-3 (PRENATAL GUMMIES/DHA & FA) 0.4-32.5 MG CHEW Chew 2 each by mouth at bedtime.    Historical Provider, MD  promethazine (PHENERGAN) 25 MG suppository Place 1 suppository (25 mg total) rectally every 6 (six) hours as needed for nausea or vomiting. Patient not taking: Reported on 05/22/2016 05/09/16   [name], CNM  promethazine (PHENERGAN) 25  MG tablet Take 1 tablet (25 mg total) by mouth every 6 (six) hours as needed for nausea or vomiting. Patient not taking: Reported on 05/22/2016 05/02/16   [name], NP  pyridOXINE (VITAMIN B-6) 100 MG tablet Take 100 mg by mouth 2 (two) times daily.    Historical Provider, MD  ranitidine (ZANTAC) 75 MG tablet Take 75 mg by mouth 2 (two) times daily as needed for heartburn.    Historical Provider, MD    Family History Family History  Problem Relation Age of Onset  . Heart disease Mother   . Hypertension Mother   . Hypertension Father   .  Hyperlipidemia Father   . Diabetes Father   . Epilepsy Brother   . Eczema Daughter   . Colon cancer Neg Hx     Social History Social History  Substance Use Topics  . Smoking status: Former Smoker    Packs/day: 0.50    Years: 5.00    Types: Cigarettes    Quit date: 06/03/2010  . Smokeless tobacco: Never Used  . Alcohol use No     Comment: 03/19/16 currently no ETOH due to meds; Previously: Wine three times a week     Allergies   Bee venom and Penicillins   Review of Systems Review of Systems  Respiratory: Negative for shortness of breath.   Cardiovascular: Negative for chest pain.  Gastrointestinal: Positive for constipation and vomiting. Negative for abdominal pain.       Positive for hematemesis  Genitourinary: Negative for dysuria.  Musculoskeletal: Negative for back pain.  All other systems reviewed and are negative.    Physical Exam Updated Vital Signs BP 128/80 (BP Location: Left Arm)   Pulse 81   Temp 98.8 F (37.1 C) (Oral)   Resp 20   Ht 5\' 1"  (1.549 m)   Wt 147 lb (66.7 kg)   LMP 03/23/2016   SpO2 100%   BMI 27.78 kg/m   Physical Exam  Constitutional: She is oriented to person, place, and time. She appears well-developed and well-nourished. No distress.  HENT:  Head: Normocephalic and atraumatic.  Mouth/Throat: Oropharynx is clear and moist. No oropharyngeal exudate.  Eyes: Conjunctivae and EOM are normal. Pupils are equal, round, and reactive to light.  Neck: Normal range of motion. Neck supple.  No meningismus.  Cardiovascular: Normal rate, regular rhythm, normal heart sounds and intact distal pulses.   No murmur heard. Pulmonary/Chest: Effort normal and breath sounds normal. No respiratory distress.  Abdominal: Soft. There is no tenderness. There is no rebound and no guarding.  Gravid abdomen. No CVA tenderness.  Musculoskeletal: Normal range of motion. She exhibits no edema or tenderness.  Neurological: She is alert and oriented to person,  place, and time. No cranial nerve deficit. She exhibits normal muscle tone. Coordination normal.   5/5 strength throughout. CN 2-12 intact.Equal grip strength.   Skin: Skin is warm.  Right upper arm has appearing bug bite with surrounding erythema, no fluctuance. Healing bite to right ankle and right forearm. No oral lesions.  Psychiatric: She has a normal mood and affect. Her behavior is normal.  Nursing note and vitals reviewed.    ED Treatments / Results  DIAGNOSTIC STUDIES:  Oxygen Saturation is 100% on RA, normal by my interpretation.    COORDINATION OF CARE:  1:12 AM Discussed treatment plan with pt at bedside which includes nausea medication and pt agreed to plan.  Labs (all labs ordered are listed, but only abnormal results are displayed) Labs Reviewed  COMPREHENSIVE METABOLIC PANEL - Abnormal; Notable for the following:       Result Value   Potassium 3.3 (*)    Total Protein 8.9 (*)    All other components within normal limits  CBC - Abnormal; Notable for the following:    RBC 5.15 (*)    Hemoglobin 11.7 (*)    MCV 71.3 (*)    MCH 22.7 (*)    RDW 15.6 (*)    All other components within normal limits  URINALYSIS, ROUTINE W REFLEX MICROSCOPIC - Abnormal; Notable for the following:    Specific Gravity, Urine 1.033 (*)    Ketones, ur 80 (*)    Protein, ur 30 (*)    Bacteria, UA RARE (*)    All other components within normal limits  PREGNANCY, URINE - Abnormal; Notable for the following:    Preg Test, Ur POSITIVE (*)    All other components within normal limits  LIPASE, BLOOD    EKG  EKG Interpretation None       Radiology No results found.  Procedures Procedures (including critical care time)  Medications Ordered in ED Medications - No data to display   Initial Impression / Assessment and Plan / ED Course  I have reviewed the triage vital signs and the nursing notes.  Pertinent labs & imaging results that were available during my care of the  patient were reviewed by me and considered in my medical decision making (see chart for details).  Clinical Course   Patient G2P1 at [redacted] weeks gestation with confirmed IUP presenting with nausea and vomiting since last night.  Some blood streaks in emesis.  Gastritis on EGD in November. Polyps on colonoscopy.  Hemoglobin stable.   She was given unisom/B6 which she states she couldn't tolerate because it made her sleepy. Has been intermittently taking the B6.  Abdomen soft.  UA with ketones, no infection. Labs reassuring. No vomiting during prolonged period of observation in the ED.  Patient feels improved. No further vomiting in the ED. Fetal heart tones are normal on bedside ultrasound. She has and antiemetics at home including pyridoxine and Phenergan. She also has phenergan suppositories.   2 L of IV fluids given. Follow up with her OB. Return precautions discussed.  EMERGENCY DEPARTMENT [place] PREGNANCY "Study: Limited Ultrasound of the Pelvis"  INDICATIONS:Pregnancy(required) and Pelvic pain Multiple views of the uterus and pelvic cavity are obtained with a multi-frequency probe.  APPROACH:Transabdominal   PERFORMED BY: Myself  IMAGES ARCHIVED?: Yes  LIMITATIONS: Emergent procedure  PREGNANCY FREE FLUID: None  PREGNANCY UTERUS FINDINGS:Uterus normal size and Gestational sac noted ADNEXAL FINDINGS:Left ovary not seen and Right ovary not seen  PREGNANCY FINDINGS: Intrauterine gestational sac noted, Yolk sac noted, Fetal pole present and Fetal heart activity seen  INTERPRETATION: Viable intrauterine pregnancy  GESTATIONAL AGE, ESTIMATE: 9 weeks  FETAL HEART RATE: 167  COMMENT(Estimate of Gestational Age):       Final Clinical Impressions(s) / ED Diagnoses   Final diagnoses:  Nausea and vomiting in pregnancy    New Prescriptions New Prescriptions   No medications on file  I personally performed the services described in this documentation, which was scribed in my  presence. The recorded information has been reviewed and is accurate.     [name], MD 05/24/16 [phone]

## 2016-05-28 ENCOUNTER — Encounter: Payer: Self-pay | Admitting: Women's Health

## 2016-05-29 ENCOUNTER — Other Ambulatory Visit: Payer: Self-pay | Admitting: Women's Health

## 2016-05-29 MED ORDER — PANTOPRAZOLE SODIUM 40 MG PO TBEC: 40.0000 mg | DELAYED_RELEASE_TABLET | Freq: Every day | ORAL | 6 refills | Status: DC

## 2016-05-29 NOTE — Progress Notes (Signed)
Pt sent my chart message, almost out of protonix 40mg  rx'd by GI, wants refill. Refill sent.  [name], CNM, WHNP-BC 05/29/2016 11:11 AM

## 2016-06-03 NOTE — L&D Delivery Note (Signed)
Delivery Note At 6:48 PM a viable female was delivered via Vaginal, Spontaneous Delivery (Presentation:OA ; vertex ).  APGAR: 8, 9; weight  .   Placenta status:spont  ,shultz .  Cord:3vc with the following complications:none .  Cord pH: n/a  Anesthesia:  epidural Episiotomy: None Lacerations: 2nd degree Suture Repair: 2.0 vicryl rapide Est. Blood Loss (mL):    Mom to postpartum.  Baby to Couplet care / Skin to Skin.  [name] 12/15/2016, 6:59 PM

## 2016-06-04 ENCOUNTER — Telehealth: Payer: Self-pay | Admitting: *Deleted

## 2016-06-04 ENCOUNTER — Emergency Department (HOSPITAL_COMMUNITY)
Admission: EM | Admit: 2016-06-04 | Discharge: 2016-06-04 | Disposition: A | Discharge status: Home / Self Care | Payer: Medicaid Other | Attending: Emergency Medicine | Admitting: Emergency Medicine

## 2016-06-04 ENCOUNTER — Encounter: Payer: Self-pay | Admitting: Women's Health

## 2016-06-04 ENCOUNTER — Encounter (HOSPITAL_COMMUNITY): Payer: Self-pay | Admitting: Emergency Medicine

## 2016-06-04 ENCOUNTER — Telehealth: Payer: Self-pay | Admitting: Obstetrics & Gynecology

## 2016-06-04 ENCOUNTER — Telehealth: Payer: Self-pay | Admitting: Gastroenterology

## 2016-06-04 DIAGNOSIS — O219 Vomiting of pregnancy, unspecified: Secondary | ICD-10-CM | POA: Diagnosis present

## 2016-06-04 DIAGNOSIS — Z3A10 10 weeks gestation of pregnancy: Secondary | ICD-10-CM | POA: Insufficient documentation

## 2016-06-04 DIAGNOSIS — Z79899 Other long term (current) drug therapy: Secondary | ICD-10-CM | POA: Insufficient documentation

## 2016-06-04 DIAGNOSIS — O210 Mild hyperemesis gravidarum: Secondary | ICD-10-CM

## 2016-06-04 DIAGNOSIS — Z87891 Personal history of nicotine dependence: Secondary | ICD-10-CM | POA: Diagnosis not present

## 2016-06-04 LAB — CBC
HEMATOCRIT: 37.6 % (ref 36.0–46.0)
HEMOGLOBIN: 11.9 g/dL — AB (ref 12.0–15.0)
MCH: 22.5 pg — ABNORMAL LOW (ref 26.0–34.0)
MCHC: 31.6 g/dL (ref 30.0–36.0)
MCV: 71.2 fL — ABNORMAL LOW (ref 78.0–100.0)
Platelets: 266 10*3/uL (ref 150–400)
RBC: 5.28 MIL/uL — AB (ref 3.87–5.11)
RDW: 15.8 % — ABNORMAL HIGH (ref 11.5–15.5)
WBC: 5.1 10*3/uL (ref 4.0–10.5)

## 2016-06-04 LAB — COMPREHENSIVE METABOLIC PANEL
ALBUMIN: 4.2 g/dL (ref 3.5–5.0)
ALT: 16 U/L (ref 14–54)
ANION GAP: 9 (ref 5–15)
AST: 21 U/L (ref 15–41)
Alkaline Phosphatase: 69 U/L (ref 38–126)
BUN: <5 mg/dL — ABNORMAL LOW (ref 6–20)
CO2: 22 mmol/L (ref 22–32)
Calcium: 9.4 mg/dL (ref 8.9–10.3)
Chloride: 103 mmol/L (ref 101–111)
Creatinine, Ser: 0.46 mg/dL (ref 0.44–1.00)
GLUCOSE: 80 mg/dL (ref 65–99)
POTASSIUM: 3.7 mmol/L (ref 3.5–5.1)
Sodium: 134 mmol/L — ABNORMAL LOW (ref 135–145)
Total Bilirubin: 0.3 mg/dL (ref 0.3–1.2)
Total Protein: 8.5 g/dL — ABNORMAL HIGH (ref 6.5–8.1)

## 2016-06-04 LAB — URINALYSIS, ROUTINE W REFLEX MICROSCOPIC
BACTERIA UA: NONE SEEN
BILIRUBIN URINE: NEGATIVE
Glucose, UA: NEGATIVE mg/dL
HGB URINE DIPSTICK: NEGATIVE
KETONES UR: 20 mg/dL — AB
LEUKOCYTES UA: NEGATIVE
NITRITE: NEGATIVE
Protein, ur: NEGATIVE mg/dL
RBC / HPF: NONE SEEN RBC/hpf (ref 0–5)
Specific Gravity, Urine: 1.023 (ref 1.005–1.030)
pH: 6 (ref 5.0–8.0)

## 2016-06-04 LAB — LIPASE, BLOOD: Lipase: 31 U/L (ref 11–51)

## 2016-06-04 MED ORDER — METOCLOPRAMIDE HCL 10 MG PO TABS: 10.0000 mg | ORAL_TABLET | Freq: Four times a day (QID) | ORAL | 0 refills | Status: DC | PRN

## 2016-06-04 MED ORDER — SODIUM CHLORIDE 0.9 % IV BOLUS (SEPSIS)
2000.0000 mL | Freq: Once | INTRAVENOUS | Status: AC
  Administered 2016-06-04: 2000 mL via INTRAVENOUS

## 2016-06-04 MED ORDER — METOCLOPRAMIDE HCL 5 MG/ML IJ SOLN
10.0000 mg | Freq: Once | INTRAMUSCULAR | Status: AC
  Administered 2016-06-04: 10 mg via INTRAVENOUS
  Filled 2016-06-04: qty 2

## 2016-06-04 NOTE — Telephone Encounter (Signed)
Called pt to let her know that her ins will not pay for the Diclegis, she has applied for pregnancy medicaid.  Informed her that when her Medicaid is active they will cover the Diclegis, she has been trying the Phenergan and the Unisom with Vit B with no relief.  Pt states she is now over at [facility] to get fluids due to the N/V.

## 2016-06-04 NOTE — Telephone Encounter (Signed)
Charlotte Mccormick spoke with patient.

## 2016-06-04 NOTE — ED Provider Notes (Signed)
[facility] DEPT Provider Note   CSN: [id number] Arrival date & time: 06/04/16  1158     History   Chief Complaint Chief Complaint  Patient presents with  . Nausea  . Emesis    HPI Charlotte Mccormick is a 31 y.o. female.  HPI  G2 P1 female at 10.5 weeks with confirmed IUP presents with vomiting which has worsened over the last 2 days. States she's been unable to keep any fluids or solids down. Denies any fever or chills. No lightheadedness or headache. No blood in vomit or stool. Continues to have constipation having 1-2 bowel movements weekly. Has taken OTC Zofran and Phenergan at home with minimal relief of vomiting. Denies any abdominal pain. No vaginal bleeding or discharge. Past Medical History:  Diagnosis Date  . Abnormal uterine bleeding (AUB) 12/12/2015  . Anemia   . Anxiety   . Constipation   . Depression   . Fibroid   . Genital herpes   . GERD (gastroesophageal reflux disease)   . History of chlamydia   . PTSD (post-traumatic stress disorder)   . Sinusitis   . Vaginal dryness 12/12/2015    Patient Active Problem List   Diagnosis Date Noted  . Encounter for supervision of other normal pregnancy 05/22/2016  . PTSD (post-traumatic stress disorder) 05/01/2016  . Pregnant 05/01/2016  . Heme positive stool 03/19/2016  . Gastroesophageal reflux disease 01/16/2016  . Constipation 01/16/2016  . Black stools 01/16/2016  . Abnormal uterine bleeding (AUB) 12/12/2015    Past Surgical History:  Procedure Laterality Date  . COLONOSCOPY N/A 04/08/2016   Procedure: COLONOSCOPY;  Surgeon: Danie Binder, MD;  [facility];  Service: Endoscopy;  Laterality: N/A;  10:15 AM  . ESOPHAGOGASTRODUODENOSCOPY N/A 04/08/2016   Procedure: ESOPHAGOGASTRODUODENOSCOPY (EGD);  Surgeon: Danie Binder, MD;  Location: [facility];  Service: Endoscopy;  Laterality: N/A;    OB History    Gravida Para Term Preterm AB Living   2 1 1     1    SAB TAB Ectopic Multiple Live Births             1       Home Medications    Prior to Admission medications   Medication Sig Start Date End Date Taking? Authorizing Provider  buPROPion (WELLBUTRIN) 100 MG tablet Take 100 mg by mouth daily.    Yes Historical Provider, MD  doxylamine, Sleep, (UNISOM) 25 MG tablet Take 25 mg by mouth at bedtime as needed for sleep.    Yes Historical Provider, MD  pantoprazole (PROTONIX) 40 MG tablet Take 1 tablet (40 mg total) by mouth daily. 05/29/16  Yes [name], CNM  promethazine (PHENERGAN) 25 MG tablet Take 1 tablet (25 mg total) by mouth every 6 (six) hours as needed for nausea or vomiting. 05/24/16  Yes [name], MD  pyridOXINE (VITAMIN B-6) 100 MG tablet Take 100 mg by mouth 2 (two) times daily.   Yes Historical Provider, MD  metoCLOPramide (REGLAN) 10 MG tablet Take 1 tablet (10 mg total) by mouth every 6 (six) hours as needed for nausea. 06/04/16   Julianne Rice, MD  ondansetron (ZOFRAN ODT) 8 MG disintegrating tablet Take 1 tablet (8 mg total) by mouth every 8 (eight) hours as needed for nausea or vomiting. Patient not taking: Reported on 06/04/2016 05/08/16   [name], CNM  Prenatal MV-Min-FA-Omega-3 (PRENATAL GUMMIES/DHA & FA) 0.4-32.5 MG CHEW Chew 2 each by mouth at bedtime.    Historical Provider,  MD  promethazine (PHENERGAN) 25 MG suppository Place 1 suppository (25 mg total) rectally every 6 (six) hours as needed for nausea or vomiting. Patient not taking: Reported on 05/22/2016 05/09/16   [name], CNM    Family History Family History  Problem Relation Age of Onset  . Heart disease Mother   . Hypertension Mother   . Hypertension Father   . Hyperlipidemia Father   . Diabetes Father   . Epilepsy Brother   . Eczema Daughter   . Colon cancer Neg Hx     Social History Social History  Substance Use Topics  . Smoking status: Former Smoker    Packs/day: 0.50    Years: 5.00    Types: Cigarettes    Quit date: 06/03/2010  . Smokeless tobacco:  Never Used  . Alcohol use No     Comment: 03/19/16 currently no ETOH due to meds; Previously: Wine three times a week     Allergies   Bee venom and Penicillins   Review of Systems Review of Systems  Constitutional: Negative for chills and fever.  Respiratory: Negative for cough, shortness of breath and wheezing.   Cardiovascular: Negative for chest pain.  Gastrointestinal: Positive for nausea and vomiting. Negative for abdominal pain, constipation and diarrhea.  Genitourinary: Negative for dysuria, pelvic pain, vaginal bleeding and vaginal discharge.  Musculoskeletal: Negative for back pain, joint swelling, myalgias, neck pain and neck stiffness.  Skin: Negative for rash and wound.  Neurological: Negative for dizziness, syncope, weakness, light-headedness, numbness and headaches.  All other systems reviewed and are negative.    Physical Exam Updated Vital Signs BP 113/66 (BP Location: Left Arm)   Pulse 102   Temp 98.4 F (36.9 C) (Oral)   Resp 16   Ht 5\' 1"  (1.549 m)   Wt 147 lb (66.7 kg)   LMP 03/23/2016   SpO2 100%   BMI 27.78 kg/m   Physical Exam  Constitutional: She is oriented to person, place, and time. She appears well-developed and well-nourished.  HENT:  Head: Normocephalic and atraumatic.  Mouth/Throat: Oropharynx is clear and moist. No oropharyngeal exudate.  Eyes: EOM are normal. Pupils are equal, round, and reactive to light.  Neck: Normal range of motion. Neck supple.  Cardiovascular: Normal rate and regular rhythm.   Pulmonary/Chest: Effort normal and breath sounds normal.  Abdominal: Soft. Bowel sounds are normal. She exhibits no mass. There is no tenderness. There is no rebound and no guarding.  Musculoskeletal: Normal range of motion. She exhibits no edema or tenderness.  Neurological: She is alert and oriented to person, place, and time.  5/5 motor in all extremities. Sensation is fully intact.  Skin: Skin is warm and dry. No rash noted. No  erythema.  Psychiatric: She has a normal mood and affect. Her behavior is normal.  Nursing note and vitals reviewed.    ED Treatments / Results  Labs (all labs ordered are listed, but only abnormal results are displayed) Labs Reviewed  COMPREHENSIVE METABOLIC PANEL - Abnormal; Notable for the following:       Result Value   Sodium 134 (*)    BUN <5 (*)    Total Protein 8.5 (*)    All other components within normal limits  CBC - Abnormal; Notable for the following:    RBC 5.28 (*)    Hemoglobin 11.9 (*)    MCV 71.2 (*)    MCH 22.5 (*)    RDW 15.8 (*)    All other components within  normal limits  URINALYSIS, ROUTINE W REFLEX MICROSCOPIC - Abnormal; Notable for the following:    Ketones, ur 20 (*)    All other components within normal limits  LIPASE, BLOOD    EKG  EKG Interpretation None       Radiology No results found.  Procedures Procedures (including critical care time)  Medications Ordered in ED Medications  sodium chloride 0.9 % bolus 2,000 mL (2,000 mLs Intravenous New Bag/Given 06/04/16 1813)  metoCLOPramide (REGLAN) injection 10 mg (10 mg Intravenous Given 06/04/16 1813)     Initial Impression / Assessment and Plan / ED Course  I have reviewed the triage vital signs and the nursing notes.  Pertinent labs & imaging results that were available during my care of the patient were reviewed by me and considered in my medical decision making (see chart for details).  Clinical Course    Patient states she is feeling much better after IV fluids and Reglan. No further vomiting in the emergency department. Encouraged to follow-up with her OB/GYN and return precautions given.   Final Clinical Impressions(s) / ED Diagnoses   Final diagnoses:  Hyperemesis gravidarum    New Prescriptions New Prescriptions   METOCLOPRAMIDE (REGLAN) 10 MG TABLET    Take 1 tablet (10 mg total) by mouth every 6 (six) hours as needed for nausea.     Julianne Rice, MD 06/04/16  1946

## 2016-06-04 NOTE — Telephone Encounter (Signed)
Pt came into the office stating that she is going to the ER because she can't hold anything down. Pt states that she needs for the nurse to place and order for her to get something done. Please contact pt

## 2016-06-04 NOTE — Telephone Encounter (Signed)
Spoke with pt. Pt is [redacted] weeks pregnant and is having vomiting. She has tried B6, Unisom, and Phenergan with no help. When I called pt back, she was at [facility] ER to get IV fluids. [place]

## 2016-06-04 NOTE — Telephone Encounter (Signed)
PT said she has been having only one and sometimes two BM's weekly since she had to stop the Linzess because she is pregnant. [name] she is 10 and 1/[redacted] weeks pregnant. )  She is now at [facility] ED since she has been having so much nausea/vomiting.  She will call me tomorrow and let me know how things went.  I am forwarding the note to Dr. Oneida Alar to address the constipation.

## 2016-06-04 NOTE — ED Notes (Signed)
Pt ambulatory to waiting room. Pt verbalized understanding of discharge instructions.   

## 2016-06-04 NOTE — ED Triage Notes (Signed)
Patient complains of nausea and vomiting that started last night.  Patient states she is [redacted] weeks pregnant.

## 2016-06-04 NOTE — Telephone Encounter (Signed)
[phone]  PATIENT CALLED BECAUSE SHE IS NOT GOING TO THE BATHROOM BUT ABOUT 2 TIMES A WEEK, IS PREGNANT AND WANTS TO KNOW IF THIS IS NORMAL

## 2016-06-05 NOTE — Telephone Encounter (Signed)
Pt is aware. She is wanting to have her esophagus checked out because of all of the reflux she is having. She said she was put on Protonix x 3 weeks ago and it is not helping. Please advise!

## 2016-06-05 NOTE — Telephone Encounter (Signed)
LMOM to call.

## 2016-06-05 NOTE — Telephone Encounter (Signed)
PLEASE CALL PT. She needs to see her OB DOCTOR IF HER REFLUX IS OUT OF CONTROL. THEY WILL MANAGE IT.

## 2016-06-05 NOTE — Telephone Encounter (Signed)
PLEASE CALL PT. SHE WILL NEED TO DISCUSS CONSTIPATION MANAGEMENT WITH HER OB DOCTOR.

## 2016-06-06 ENCOUNTER — Other Ambulatory Visit: Payer: Self-pay | Admitting: Obstetrics and Gynecology

## 2016-06-06 ENCOUNTER — Encounter (HOSPITAL_COMMUNITY)
Admission: RE | Admit: 2016-06-06 | Discharge: 2016-06-06 | Disposition: A | Discharge status: Home / Self Care | Payer: Medicaid Other | Source: Ambulatory Visit | Attending: Obstetrics and Gynecology | Admitting: Obstetrics and Gynecology

## 2016-06-06 ENCOUNTER — Encounter: Payer: Self-pay | Admitting: Women's Health

## 2016-06-06 ENCOUNTER — Telehealth: Payer: Self-pay | Admitting: Obstetrics and Gynecology

## 2016-06-06 ENCOUNTER — Ambulatory Visit (INDEPENDENT_AMBULATORY_CARE_PROVIDER_SITE_OTHER): Payer: Medicaid Other | Admitting: Women's Health

## 2016-06-06 VITALS — BP 105/59 | HR 97 | Wt 144.0 lb

## 2016-06-06 DIAGNOSIS — Z331 Pregnant state, incidental: Secondary | ICD-10-CM

## 2016-06-06 DIAGNOSIS — O210 Mild hyperemesis gravidarum: Secondary | ICD-10-CM

## 2016-06-06 DIAGNOSIS — Z3A11 11 weeks gestation of pregnancy: Secondary | ICD-10-CM | POA: Diagnosis not present

## 2016-06-06 DIAGNOSIS — Z1389 Encounter for screening for other disorder: Secondary | ICD-10-CM | POA: Diagnosis not present

## 2016-06-06 DIAGNOSIS — O211 Hyperemesis gravidarum with metabolic disturbance: Secondary | ICD-10-CM | POA: Insufficient documentation

## 2016-06-06 MED ORDER — PROMETHAZINE HCL 25 MG/ML IJ SOLN
12.5000 mg | INTRAMUSCULAR | Status: DC | PRN
  Administered 2016-06-06: 12.5 mg via INTRAVENOUS

## 2016-06-06 MED ORDER — PROMETHAZINE HCL 6.25 MG/5ML PO SYRP: 12.5000 mg | ORAL_SOLUTION | ORAL | 2 refills | Status: DC | PRN

## 2016-06-06 MED ORDER — SODIUM CHLORIDE 0.9 % IV SOLN: Freq: Once | INTRAVENOUS | Status: DC

## 2016-06-06 MED ORDER — SODIUM CHLORIDE 0.9 % IV SOLN
INTRAVENOUS | Status: DC
  Administered 2016-06-06: 1000 mL via INTRAVENOUS

## 2016-06-06 MED ORDER — DEXTROSE 5 % AND 0.45 % NACL IV BOLUS: 1000.0000 mL | Freq: Once | INTRAVENOUS | Status: DC

## 2016-06-06 MED ORDER — PROMETHAZINE HCL 25 MG/ML IJ SOLN
INTRAMUSCULAR | Status: AC
  Filled 2016-06-06: qty 1

## 2016-06-06 MED ORDER — PROMETHAZINE HCL 25 MG/ML IJ SOLN: 12.5000 mg | INTRAMUSCULAR | 0 refills | Status: DC | PRN

## 2016-06-06 MED ORDER — METHYLPREDNISOLONE 4 MG PO TABS: 4.0000 mg | ORAL_TABLET | Freq: Every day | ORAL | 0 refills | Status: DC

## 2016-06-06 MED ORDER — DEXTROSE-NACL 5-0.45 % IV SOLN
INTRAVENOUS | Status: DC
  Administered 2016-06-06: 11:00:00 via INTRAVENOUS

## 2016-06-06 NOTE — Patient Instructions (Signed)
Constipation  Drink plenty of fluid, preferably water, throughout the day  Eat foods high in fiber such as fruits, vegetables, and grains  Exercise, such as walking, is a good way to keep your bowels regular  Drink warm fluids, especially warm prune juice, or decaf coffee  Eat a 1/2 cup of real oatmeal (not instant), 1/2 cup applesauce, and 1/2-1 cup warm prune juice every day  If needed, you may take Colace (docusate sodium) stool softener once or twice a day to help keep the stool soft. If you are pregnant, wait until you are out of your first trimester (12-14 weeks of pregnancy)  If you still are having problems with constipation, you may take Miralax once daily as needed to help keep your bowels regular.  If you are pregnant, wait until you are out of your first trimester (12-14 weeks of pregnancy)    Nausea & Vomiting  Have saltine crackers or pretzels by your bed and eat a few bites before you raise your head out of bed in the morning  Eat small frequent meals throughout the day instead of large meals  Drink plenty of fluids throughout the day to stay hydrated, just don't drink a lot of fluids with your meals.  This can make your stomach fill up faster making you feel sick  Do not brush your teeth right after you eat  Products with real ginger are good for nausea, like ginger ale and ginger hard candy Make sure it says made with real ginger!  Sucking on sour candy like lemon heads is also good for nausea  If your prenatal vitamins make you nauseated, take them at night so you will sleep through the nausea  Sea Bands  If you feel like you need medicine for the nausea & vomiting please let us know  If you are unable to keep any fluids or food down please let us know   Hyperemesis Gravidarum Hyperemesis gravidarum is a severe form of nausea and vomiting that happens during pregnancy. Hyperemesis is worse than morning sickness. It may cause you to have nausea or vomiting  all day for many days. It may keep you from eating and drinking enough food and liquids. Hyperemesis usually occurs during the first half (the first 20 weeks) of pregnancy. It often goes away once a woman is in her second half of pregnancy. However, sometimes hyperemesis continues through an entire pregnancy. What are the causes? The cause of this condition is not known. It may be related to changes in chemicals (hormones) in the body during pregnancy, such as the high level of pregnancy hormone (human chorionic gonadotropin) or the increase in the female sex hormone (estrogen). What are the signs or symptoms? Symptoms of this condition include:  Severe nausea and vomiting.  Nausea that does not go away.  Vomiting that does not allow you to keep any food down.  Weight loss.  Body fluid loss (dehydration).  Having no desire to eat, or not liking food that you have previously enjoyed. How is this diagnosed? This condition may be diagnosed based on:  A physical exam.  Your medical history.  Your symptoms.  Blood tests.  Urine tests. How is this treated? This condition may be managed with medicine. If medicines to do not help relieve nausea and vomiting, you may need to receive fluids through an IV tube at the hospital. Follow these instructions at home:  Take over-the-counter and prescription medicines only as told by your health care provider.  Avoid iron pills and multivitamins that contain iron for the first 3-4 months of pregnancy. If you take prescription iron pills, do not stop taking them unless your health care provider approves.  Take the following actions to help prevent nausea and vomiting:  In the morning, before getting out of bed, try eating a couple of dry crackers or a piece of toast.  Avoid foods and smells that upset your stomach. Fatty and spicy foods may make nausea worse.  Eat 5-6 small meals a day.  Do not drink fluids while eating meals. Drink between  meals.  Eat or suck on things that have ginger in them. Ginger can help relieve nausea.  Avoid food preparation. The smell of food can spoil your appetite or trigger nausea.  Follow instructions from your health care provider about eating or drinking restrictions.  For snacks, eat high-protein foods, such as cheese.  Keep all follow-up and pre-birth (prenatal) visits as told by your health care provider. This is important. Contact a health care provider if:  You have pain in your abdomen.  You have a severe headache.  You have vision problems.  You are losing weight. Get help right away if:  You cannot drink fluids without vomiting.  You vomit blood.  You have constant nausea and vomiting.  You are very weak.  You are very thirsty.  You feel dizzy.  You faint.  You have a fever or other symptoms that last for more than 2-3 days.  You have a fever and your symptoms suddenly get worse. Summary  Hyperemesis gravidarum is a severe form of nausea and vomiting that happens during pregnancy.  Making some changes to your eating habits may help relieve nausea and vomiting.  This condition may be managed with medicine.  If medicines to do not help relieve nausea and vomiting, you may need to receive fluids through an IV tube at the hospital. This information is not intended to replace advice given to you by your health care provider. Make sure you discuss any questions you have with your health care provider. Document Released: 05/20/2005 Document Revised: 01/17/2016 Document Reviewed: 01/17/2016 Elsevier Interactive Patient Education  2017 [employer].

## 2016-06-06 NOTE — Telephone Encounter (Signed)
PT is aware.

## 2016-06-06 NOTE — Telephone Encounter (Signed)
Pt called stating that the medication that was sent to her pharmacy was sent wrong her phenergan was suppose to me 25mg  and instead it was liquid form please contact pt

## 2016-06-06 NOTE — Progress Notes (Signed)
[facility] Visit  Patient name: Charlotte Mccormick MRN [id number]  Date of birth: 27-Feb-1986  CC & HPI:  Charlotte Mccormick is a 31 y.o. G31P1001 African American female at [redacted]w[redacted]d presenting today for n/v pregnancy. Scheduled for NEW OB 06/19/16 w/ [facility]CD. Has had new ob intake w/ Tish.  No fm yet. Denies cramping, lof, vb, or uti s/s.  Has been to ED twice w/ n/v of pregnancy, has rx's for phenergan suppositories, zofran, reglan, and protonix 40mg  at home as well as taking otc unisom/vit b6. N/V better at times, but for past 2-3 days has been bad again. Went to ED 1/2, given IVF and rx for reglan. Vomited ~6-7x's yesterday, 2 already this am. Not keeping any food/fluid down. Has lost 8lbs over last month. Doesn't take phenergan suppositories b/c she says they burn for a few mins. Zofran helps some, but is also constipated- only having 1 bm/wk.  Unable to void this am, last voided at 0300.   Patient's last menstrual period was 03/23/2016.  Pertinent History Reviewed:  Medical & Surgical Hx:   Past medical, surgical, family, and social history reviewed in electronic medical record Medications: Reviewed & Updated - see associated section Allergies: Reviewed in electronic medical record  Objective Findings:  Vitals: BP (!) 105/59   Pulse 97   Wt 144 lb (65.3 kg)   LMP 03/23/2016   BMI 27.21 kg/m  Body mass index is 27.21 kg/m.  Physical Examination: General appearance - appears like she doesn't feel well HRRR Moist mucous membranes FHR 158 by doppler  No results found for this or any previous visit (from the past 24 hour(s)).   Assessment & Plan:  A:   [redacted]w[redacted]d SIUP  Hyperemesis gravidarum  P:  Discussed w/ [facility], will send to [facility] for 2L IVF and phenergan, per him he will also rx medrol dose pack for home before she leaves [facility]. All orders placed by him.  Gave printed info on hyperemesis gravidarum, n/v tips, constipation prevention/relief measures.  Plan:   See weekly for now  F/U in 1wk for OB appointment w/ MD, then keep new ob visit as scheduled on 1/17  Tawnya Crook CNM, [facility] 06/06/2016 10:39 AM

## 2016-06-06 NOTE — Telephone Encounter (Signed)
Patient called stating you were going to send in a prescription for a steroid. And she also wanted to know if phenergan could be prescribed in a liquid form and if so to please send that in as well.

## 2016-06-06 NOTE — Telephone Encounter (Signed)
Pt came in for OV today.

## 2016-06-07 NOTE — Telephone Encounter (Signed)
Left VM to inform pt her prescription for Medrol was not sent to pharmacy and needed to be picked up from office

## 2016-06-10 ENCOUNTER — Encounter: Payer: Self-pay | Admitting: Obstetrics & Gynecology

## 2016-06-10 ENCOUNTER — Telehealth: Payer: Self-pay | Admitting: Obstetrics & Gynecology

## 2016-06-10 NOTE — Telephone Encounter (Signed)
Pt came into the office stating that she needs a letter stating that she is able to return to work. Please contact pt

## 2016-06-13 ENCOUNTER — Encounter: Payer: Self-pay | Admitting: Obstetrics and Gynecology

## 2016-06-13 ENCOUNTER — Ambulatory Visit (INDEPENDENT_AMBULATORY_CARE_PROVIDER_SITE_OTHER): Payer: Medicaid Other | Admitting: Obstetrics and Gynecology

## 2016-06-13 VITALS — BP 138/85 | HR 105 | Wt 150.0 lb

## 2016-06-13 DIAGNOSIS — Z3A12 12 weeks gestation of pregnancy: Secondary | ICD-10-CM

## 2016-06-13 DIAGNOSIS — Z331 Pregnant state, incidental: Secondary | ICD-10-CM | POA: Diagnosis not present

## 2016-06-13 DIAGNOSIS — O210 Mild hyperemesis gravidarum: Secondary | ICD-10-CM | POA: Diagnosis not present

## 2016-06-13 DIAGNOSIS — R030 Elevated blood-pressure reading, without diagnosis of hypertension: Secondary | ICD-10-CM | POA: Diagnosis not present

## 2016-06-13 DIAGNOSIS — Z1389 Encounter for screening for other disorder: Secondary | ICD-10-CM | POA: Diagnosis not present

## 2016-06-13 DIAGNOSIS — O211 Hyperemesis gravidarum with metabolic disturbance: Secondary | ICD-10-CM

## 2016-06-13 DIAGNOSIS — Z3481 Encounter for supervision of other normal pregnancy, first trimester: Secondary | ICD-10-CM

## 2016-06-13 LAB — POCT URINALYSIS DIPSTICK
Blood, UA: NEGATIVE
GLUCOSE UA: NEGATIVE
KETONES UA: NEGATIVE
LEUKOCYTES UA: NEGATIVE
NITRITE UA: NEGATIVE
Protein, UA: NEGATIVE

## 2016-06-13 NOTE — Progress Notes (Addendum)
Patient ID: Charlotte Mccormick, female   DOB: December 19, 1985, 31 y.o.   MRN: [id number] G2P1001 [redacted]w[redacted]d Estimated Date of Delivery: 12/28/16 LROB   Hyperemesis, s/p hospitalization for HGravidarum.placed on Methylprednisolone taper.                         Much better on Phenergan liquid also. Patient reports good fetal movement, denies any bleeding and no rupture of membranes symptoms or regular contractions. Patient complaints: no complaints at this time. Pt hasn't tried any medications for the relief of her symptoms. Pt denies any other symptoms.   Blood pressure 138/85, pulse (!) 105, weight 150 lb (68 kg), last menstrual period 03/23/2016. refer to the ob flow sheet for FH and FHR, also BP, Wt, Urine results:notable for negative                          Physical Examination:  General appearance - alert, well appearing, and in no distress Abdomen - FH 10 cm                   -FHR 146 soft, nontender, nondistended, no masses or organomegaly             Questions were answered. Assessment: LROB G2P1001 @ [redacted]w[redacted]d                        Hyperemesis ,on steroid dosepak 1/4-1/18, Phenergan Plan:  Continued routine obstetrical care  F/u in 1 week for NT/IT and routine OB  By signing my name below, I, Soijett Blue, attest that this documentation has been prepared under the direction and in the presence of [name], MD. Electronically Signed: Florin, ED Scribe. 06/13/16. 10:24 AM.  I personally performed the services described in this documentation, which was SCRIBED in my presence. The recorded information has been reviewed and considered accurate. It has been edited as necessary during review. [name], MD

## 2016-06-14 ENCOUNTER — Encounter (HOSPITAL_COMMUNITY): Payer: Self-pay | Admitting: Adult Health

## 2016-06-14 ENCOUNTER — Emergency Department (HOSPITAL_COMMUNITY)
Admission: EM | Admit: 2016-06-14 | Discharge: 2016-06-15 | Disposition: A | Discharge status: Home / Self Care | Payer: Medicaid Other | Attending: Emergency Medicine | Admitting: Emergency Medicine

## 2016-06-14 DIAGNOSIS — M940 Chondrocostal junction syndrome [Tietze]: Secondary | ICD-10-CM | POA: Diagnosis not present

## 2016-06-14 DIAGNOSIS — Z87891 Personal history of nicotine dependence: Secondary | ICD-10-CM | POA: Insufficient documentation

## 2016-06-14 DIAGNOSIS — Z3A12 12 weeks gestation of pregnancy: Secondary | ICD-10-CM | POA: Diagnosis not present

## 2016-06-14 DIAGNOSIS — R0789 Other chest pain: Secondary | ICD-10-CM

## 2016-06-14 DIAGNOSIS — O9989 Other specified diseases and conditions complicating pregnancy, childbirth and the puerperium: Secondary | ICD-10-CM | POA: Insufficient documentation

## 2016-06-14 DIAGNOSIS — Z79899 Other long term (current) drug therapy: Secondary | ICD-10-CM | POA: Insufficient documentation

## 2016-06-14 LAB — I-STAT TROPONIN, ED: Troponin i, poc: 0.01 ng/mL (ref 0.00–0.08)

## 2016-06-14 LAB — CBC
HCT: 32.8 % — ABNORMAL LOW (ref 36.0–46.0)
Hemoglobin: 10.8 g/dL — ABNORMAL LOW (ref 12.0–15.0)
MCH: 23.1 pg — AB (ref 26.0–34.0)
MCHC: 32.9 g/dL (ref 30.0–36.0)
MCV: 70.1 fL — AB (ref 78.0–100.0)
Platelets: 297 10*3/uL (ref 150–400)
RBC: 4.68 MIL/uL (ref 3.87–5.11)
RDW: 16.5 % — ABNORMAL HIGH (ref 11.5–15.5)
WBC: 15 10*3/uL — ABNORMAL HIGH (ref 4.0–10.5)

## 2016-06-14 LAB — BASIC METABOLIC PANEL
ANION GAP: 8 (ref 5–15)
BUN: 10 mg/dL (ref 6–20)
CALCIUM: 9.2 mg/dL (ref 8.9–10.3)
CHLORIDE: 101 mmol/L (ref 101–111)
CO2: 24 mmol/L (ref 22–32)
Creatinine, Ser: 0.49 mg/dL (ref 0.44–1.00)
GFR calc Af Amer: >60 mL/min (ref >60)
GFR calc non Af Amer: >60 mL/min (ref >60)
GLUCOSE: 106 mg/dL — AB (ref 65–99)
Potassium: 3.6 mmol/L (ref 3.5–5.1)
Sodium: 133 mmol/L — ABNORMAL LOW (ref 135–145)

## 2016-06-14 MED ORDER — ACETAMINOPHEN 500 MG PO TABS
1000.0000 mg | ORAL_TABLET | Freq: Once | ORAL | Status: AC
  Administered 2016-06-15: 1000 mg via ORAL
  Filled 2016-06-14: qty 2

## 2016-06-14 NOTE — ED Triage Notes (Signed)
PResents with sternal chest pain described as tightness that began last night while lying in bed. Pain is worse with deep breathing and ambulation and better "when I calm down". Pain is consistent for 45 minutes at a time and then goes away but comes back. It has worsened in intensity and pt is tearful.   Denies radiation. Pain is rated 9/10. PT is [redacted] weeks pregnant
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
## 2016-06-15 NOTE — Discharge Instructions (Signed)
Use ice or heat for comfort. You can take acetaminophen for pain if needed. Recheck if you get worse.

## 2016-06-17 ENCOUNTER — Other Ambulatory Visit: Payer: Self-pay | Admitting: Obstetrics and Gynecology

## 2016-06-17 DIAGNOSIS — Z3682 Encounter for antenatal screening for nuchal translucency: Secondary | ICD-10-CM

## 2016-06-18 ENCOUNTER — Telehealth: Payer: Self-pay | Admitting: Advanced Practice Midwife

## 2016-06-18 ENCOUNTER — Ambulatory Visit (INDEPENDENT_AMBULATORY_CARE_PROVIDER_SITE_OTHER): Payer: Medicaid Other

## 2016-06-18 ENCOUNTER — Other Ambulatory Visit: Payer: Medicaid Other

## 2016-06-18 ENCOUNTER — Other Ambulatory Visit: Payer: Self-pay | Admitting: Obstetrics and Gynecology

## 2016-06-18 DIAGNOSIS — Z3682 Encounter for antenatal screening for nuchal translucency: Secondary | ICD-10-CM

## 2016-06-18 DIAGNOSIS — Z3481 Encounter for supervision of other normal pregnancy, first trimester: Secondary | ICD-10-CM

## 2016-06-18 DIAGNOSIS — Z3A09 9 weeks gestation of pregnancy: Secondary | ICD-10-CM

## 2016-06-18 DIAGNOSIS — O4691 Antepartum hemorrhage, unspecified, first trimester: Secondary | ICD-10-CM

## 2016-06-18 NOTE — Progress Notes (Signed)
[place] 12+3 wks,crl 79.1 mm,NB present,NT 1.7 mm,fhr 149 bpm,subchorionic hemorrhage 5.8 x 1.9 x 3.1 cm (no vaginal bleeding)

## 2016-06-19 ENCOUNTER — Encounter: Payer: 59 | Admitting: Advanced Practice Midwife

## 2016-06-20 ENCOUNTER — Other Ambulatory Visit: Payer: Self-pay | Admitting: Advanced Practice Midwife

## 2016-06-21 ENCOUNTER — Other Ambulatory Visit: Payer: Self-pay | Admitting: Obstetrics and Gynecology

## 2016-06-21 ENCOUNTER — Telehealth: Payer: Self-pay | Admitting: Advanced Practice Midwife

## 2016-06-21 NOTE — Telephone Encounter (Signed)
I called [facility] and the patient has already used both refills.

## 2016-06-21 NOTE — Telephone Encounter (Signed)
Pt states she needs a refill on the liquid Phenergan sent to [facility] in [place].

## 2016-06-25 ENCOUNTER — Ambulatory Visit (INDEPENDENT_AMBULATORY_CARE_PROVIDER_SITE_OTHER): Payer: Medicaid Other | Admitting: Obstetrics & Gynecology

## 2016-06-25 ENCOUNTER — Encounter: Payer: Self-pay | Admitting: Obstetrics & Gynecology

## 2016-06-25 ENCOUNTER — Telehealth: Payer: Self-pay | Admitting: Obstetrics and Gynecology

## 2016-06-25 VITALS — BP 90/60 | HR 76 | Wt 150.0 lb

## 2016-06-25 DIAGNOSIS — Z331 Pregnant state, incidental: Secondary | ICD-10-CM | POA: Diagnosis not present

## 2016-06-25 DIAGNOSIS — O210 Mild hyperemesis gravidarum: Secondary | ICD-10-CM

## 2016-06-25 DIAGNOSIS — Z1389 Encounter for screening for other disorder: Secondary | ICD-10-CM

## 2016-06-25 DIAGNOSIS — Z3A13 13 weeks gestation of pregnancy: Secondary | ICD-10-CM

## 2016-06-25 LAB — POCT URINALYSIS DIPSTICK
Glucose, UA: NEGATIVE
KETONES UA: NEGATIVE
Leukocytes, UA: NEGATIVE
Nitrite, UA: NEGATIVE
RBC UA: NEGATIVE

## 2016-06-25 MED ORDER — PROMETHAZINE HCL 6.25 MG/5ML PO SYRP: ORAL_SOLUTION | ORAL | 11 refills | Status: DC

## 2016-06-25 NOTE — Progress Notes (Signed)
Meds ordered this encounter  Medications  . buPROPion (WELLBUTRIN) 100 MG tablet    Sig: Take 100 mg by mouth 2 (two) times daily.  Marland Kitchen doxylamine, Sleep, (UNISOM) 25 MG tablet    Sig: Take 25 mg by mouth at bedtime as needed.  . promethazine (PHENERGAN) 6.25 MG/5ML syrup    Sig: TAKE 10 MLS BY MOUTH EVERY 4 HOURS AS NEEDED FOR NAUSEA AND VOMITING    Dispense:  240 mL    Refill:  11     Pt has not been taking her meds Will rx phenrgan and give steroid dose pak No ketones today She Feels if she gets phenergan she will be ok fht 165  Keep scheduled appt

## 2016-06-25 NOTE — Telephone Encounter (Signed)
Patient called stating she is very weak and shaky and wants to go to short stay for fluids. She states that Dr Glo Herring told her just to call for the order if she needed it. I spoke with Doree Fudge, CNM who stated she needed to be seen in our office and evaluated before we sent her over. Pt transferred to make appointment.

## 2016-06-26 LAB — MATERNAL SCREEN, INTEGRATED #1
CROWN RUMP LENGTH MAT SCREEN: 79 mm
Gest. Age on Collection Date: 13.3 weeks
Maternal Age at EDD: 30.9 years
Nuchal Translucency (NT): 1.7 mm
Number of Fetuses: 1
PAPP-A Value: 2525.7 ng/mL
Weight: 148 [lb_av]

## 2016-07-02 ENCOUNTER — Encounter: Payer: Self-pay | Admitting: *Deleted

## 2016-07-03 ENCOUNTER — Ambulatory Visit (INDEPENDENT_AMBULATORY_CARE_PROVIDER_SITE_OTHER): Payer: Medicaid Other | Admitting: Advanced Practice Midwife

## 2016-07-03 ENCOUNTER — Encounter: Payer: Self-pay | Admitting: Advanced Practice Midwife

## 2016-07-03 VITALS — BP 102/62 | HR 88 | Wt 156.0 lb

## 2016-07-03 DIAGNOSIS — O210 Mild hyperemesis gravidarum: Secondary | ICD-10-CM

## 2016-07-03 DIAGNOSIS — Z331 Pregnant state, incidental: Secondary | ICD-10-CM

## 2016-07-03 DIAGNOSIS — Z3482 Encounter for supervision of other normal pregnancy, second trimester: Secondary | ICD-10-CM

## 2016-07-03 DIAGNOSIS — Z3A14 14 weeks gestation of pregnancy: Secondary | ICD-10-CM | POA: Diagnosis not present

## 2016-07-03 DIAGNOSIS — Z1389 Encounter for screening for other disorder: Secondary | ICD-10-CM | POA: Diagnosis not present

## 2016-07-03 LAB — POCT URINALYSIS DIPSTICK
Glucose, UA: NEGATIVE
KETONES UA: NEGATIVE
Leukocytes, UA: NEGATIVE
Nitrite, UA: NEGATIVE
RBC UA: NEGATIVE

## 2016-07-03 NOTE — Patient Instructions (Signed)
 First Trimester of Pregnancy The first trimester of pregnancy is from week 1 until the end of week 12 (months 1 through 3). A week after a sperm fertilizes an egg, the egg will implant on the wall of the uterus. This embryo will begin to develop into a baby. Genes from you and your partner are forming the baby. The female genes determine whether the baby is a boy or a girl. At 6-8 weeks, the eyes and face are formed, and the heartbeat can be seen on ultrasound. At the end of 12 weeks, all the baby's organs are formed.  Now that you are pregnant, you will want to do everything you can to have a healthy baby. Two of the most important things are to get good prenatal care and to follow your health care provider's instructions. Prenatal care is all the medical care you receive before the baby's birth. This care will help prevent, find, and treat any problems during the pregnancy and childbirth. BODY CHANGES Your body goes through many changes during pregnancy. The changes vary from woman to woman.   You may gain or lose a couple of pounds at first.  You may feel sick to your stomach (nauseous) and throw up (vomit). If the vomiting is uncontrollable, call your health care provider.  You may tire easily.  You may develop headaches that can be relieved by medicines approved by your health care provider.  You may urinate more often. Painful urination may mean you have a bladder infection.  You may develop heartburn as a result of your pregnancy.  You may develop constipation because certain hormones are causing the muscles that push waste through your intestines to slow down.  You may develop hemorrhoids or swollen, bulging veins (varicose veins).  Your breasts may begin to grow larger and become tender. Your nipples may stick out more, and the tissue that surrounds them (areola) may become darker.  Your gums may bleed and may be sensitive to brushing and flossing.  Dark spots or blotches  (chloasma, mask of pregnancy) may develop on your face. This will likely fade after the baby is born.  Your menstrual periods will stop.  You may have a loss of appetite.  You may develop cravings for certain kinds of food.  You may have changes in your emotions from day to day, such as being excited to be pregnant or being concerned that something may go wrong with the pregnancy and baby.  You may have more vivid and strange dreams.  You may have changes in your hair. These can include thickening of your hair, rapid growth, and changes in texture. Some women also have hair loss during or after pregnancy, or hair that feels dry or thin. Your hair will most likely return to normal after your baby is born. WHAT TO EXPECT AT YOUR PRENATAL VISITS During a routine prenatal visit:  You will be weighed to make sure you and the baby are growing normally.  Your blood pressure will be taken.  Your abdomen will be measured to track your baby's growth.  The fetal heartbeat will be listened to starting around week 10 or 12 of your pregnancy.  Test results from any previous visits will be discussed. Your health care provider may ask you:  How you are feeling.  If you are feeling the baby move.  If you have had any abnormal symptoms, such as leaking fluid, bleeding, severe headaches, or abdominal cramping.  If you have any questions. Other   tests that may be performed during your first trimester include:  Blood tests to find your blood type and to check for the presence of any previous infections. They will also be used to check for low iron levels (anemia) and Rh antibodies. Later in the pregnancy, blood tests for diabetes will be done along with other tests if problems develop.  Urine tests to check for infections, diabetes, or protein in the urine.  An ultrasound to confirm the proper growth and development of the baby.  An amniocentesis to check for possible genetic problems.  Fetal  screens for spina bifida and Down syndrome.  You may need other tests to make sure you and the baby are doing well. HOME CARE INSTRUCTIONS  Medicines  Follow your health care provider's instructions regarding medicine use. Specific medicines may be either safe or unsafe to take during pregnancy.  Take your prenatal vitamins as directed.  If you develop constipation, try taking a stool softener if your health care provider approves. Diet  Eat regular, well-balanced meals. Choose a variety of foods, such as meat or vegetable-based protein, fish, milk and low-fat dairy products, vegetables, fruits, and whole grain breads and cereals. Your health care provider will help you determine the amount of weight gain that is right for you.  Avoid raw meat and uncooked cheese. These carry germs that can cause birth defects in the baby.  Eating four or five small meals rather than three large meals a day may help relieve nausea and vomiting. If you start to feel nauseous, eating a few soda crackers can be helpful. Drinking liquids between meals instead of during meals also seems to help nausea and vomiting.  If you develop constipation, eat more high-fiber foods, such as fresh vegetables or fruit and whole grains. Drink enough fluids to keep your urine clear or pale yellow. Activity and Exercise  Exercise only as directed by your health care provider. Exercising will help you:  Control your weight.  Stay in shape.  Be prepared for labor and delivery.  Experiencing pain or cramping in the lower abdomen or low back is a good sign that you should stop exercising. Check with your health care provider before continuing normal exercises.  Try to avoid standing for long periods of time. Move your legs often if you must stand in one place for a long time.  Avoid heavy lifting.  Wear low-heeled shoes, and practice good posture.  You may continue to have sex unless your health care provider directs you  otherwise. Relief of Pain or Discomfort  Wear a good support bra for breast tenderness.   Take warm sitz baths to soothe any pain or discomfort caused by hemorrhoids. Use hemorrhoid cream if your health care provider approves.   Rest with your legs elevated if you have leg cramps or low back pain.  If you develop varicose veins in your legs, wear support hose. Elevate your feet for 15 minutes, 3-4 times a day. Limit salt in your diet. Prenatal Care  Schedule your prenatal visits by the twelfth week of pregnancy. They are usually scheduled monthly at first, then more often in the last 2 months before delivery.  Write down your questions. Take them to your prenatal visits.  Keep all your prenatal visits as directed by your health care provider. Safety  Wear your seat belt at all times when driving.  Make a list of emergency phone numbers, including numbers for family, friends, the hospital, and police and fire departments. General   Tips  Ask your health care provider for a referral to a local prenatal education class. Begin classes no later than at the beginning of month 6 of your pregnancy.  Ask for help if you have counseling or nutritional needs during pregnancy. Your health care provider can offer advice or refer you to specialists for help with various needs.  Do not use hot tubs, steam rooms, or saunas.  Do not douche or use tampons or scented sanitary pads.  Do not cross your legs for long periods of time.  Avoid cat litter boxes and soil used by cats. These carry germs that can cause birth defects in the baby and possibly loss of the fetus by miscarriage or stillbirth.  Avoid all smoking, herbs, alcohol, and medicines not prescribed by your health care provider. Chemicals in these affect the formation and growth of the baby.  Schedule a dentist appointment. At home, brush your teeth with a soft toothbrush and be gentle when you floss. SEEK MEDICAL CARE IF:   You have  dizziness.  You have mild pelvic cramps, pelvic pressure, or nagging pain in the abdominal area.  You have persistent nausea, vomiting, or diarrhea.  You have a bad smelling vaginal discharge.  You have pain with urination.  You notice increased swelling in your face, hands, legs, or ankles. SEEK IMMEDIATE MEDICAL CARE IF:   You have a fever.  You are leaking fluid from your vagina.  You have spotting or bleeding from your vagina.  You have severe abdominal cramping or pain.  You have rapid weight gain or loss.  You vomit blood or material that looks like coffee grounds.  You are exposed to German measles and have never had them.  You are exposed to fifth disease or chickenpox.  You develop a severe headache.  You have shortness of breath.  You have any kind of trauma, such as from a fall or a car accident. Document Released: 05/14/2001 Document Revised: 10/04/2013 Document Reviewed: 03/30/2013 [employer] Patient Information 2015 [employer]. This information is not intended to replace advice given to you by your health care provider. Make sure you discuss any questions you have with your health care provider.   Nausea & Vomiting  Have saltine crackers or pretzels by your bed and eat a few bites before you raise your head out of bed in the morning  Eat small frequent meals throughout the day instead of large meals  Drink plenty of fluids throughout the day to stay hydrated, just don't drink a lot of fluids with your meals.  This can make your stomach fill up faster making you feel sick  Do not brush your teeth right after you eat  Products with real ginger are good for nausea, like ginger ale and ginger hard candy Make sure it says made with real ginger!  Sucking on sour candy like lemon heads is also good for nausea  If your prenatal vitamins make you nauseated, take them at night so you will sleep through the nausea  Sea Bands  If you feel like you need  medicine for the nausea & vomiting please let us know  If you are unable to keep any fluids or food down please let us know   Constipation  Drink plenty of fluid, preferably water, throughout the day  Eat foods high in fiber such as fruits, vegetables, and grains  Exercise, such as walking, is a good way to keep your bowels regular  Drink warm fluids, especially warm   prune juice, or decaf coffee  Eat a 1/2 cup of real oatmeal (not instant), 1/2 cup applesauce, and 1/2-1 cup warm prune juice every day  If needed, you may take Colace (docusate sodium) stool softener once or twice a day to help keep the stool soft. If you are pregnant, wait until you are out of your first trimester (12-14 weeks of pregnancy)  If you still are having problems with constipation, you may take Miralax once daily as needed to help keep your bowels regular.  If you are pregnant, wait until you are out of your first trimester (12-14 weeks of pregnancy)  Safe Medications in Pregnancy   Acne: Benzoyl Peroxide Salicylic Acid  Backache/Headache: Tylenol: 2 regular strength every 4 hours OR              2 Extra strength every 6 hours  Colds/Coughs/Allergies: Benadryl (alcohol free) 25 mg every 6 hours as needed Breath right strips Claritin Cepacol throat lozenges Chloraseptic throat spray Cold-Eeze- up to three times per day Cough drops, alcohol free Flonase (by prescription only) Guaifenesin Mucinex Robitussin DM (plain only, alcohol free) Saline nasal spray/drops Sudafed (pseudoephedrine) & Actifed ** use only after [redacted] weeks gestation and if you do not have high blood pressure Tylenol Vicks Vaporub Zinc lozenges Zyrtec   Constipation: Colace Ducolax suppositories Fleet enema Glycerin suppositories Metamucil Milk of magnesia Miralax Senokot Smooth move tea  Diarrhea: Kaopectate Imodium A-D  *NO pepto Bismol  Hemorrhoids: Anusol Anusol HC Preparation  H Tucks  Indigestion: Tums Maalox Mylanta Zantac  Pepcid  Insomnia: Benadryl (alcohol free) 25mg every 6 hours as needed Tylenol PM Unisom, no Gelcaps  Leg Cramps: Tums MagGel  Nausea/Vomiting:  Bonine Dramamine Emetrol Ginger extract Sea bands Meclizine  Nausea medication to take during pregnancy:  Unisom (doxylamine succinate 25 mg tablets) Take one tablet daily at bedtime. If symptoms are not adequately controlled, the dose can be increased to a maximum recommended dose of two tablets daily (1/2 tablet in the morning, 1/2 tablet mid-afternoon and one at bedtime). Vitamin B6 100mg tablets. Take one tablet twice a day (up to 200 mg per day).  Skin Rashes: Aveeno products Benadryl cream or 25mg every 6 hours as needed Calamine Lotion 1% cortisone cream  Yeast infection: Gyne-lotrimin 7 Monistat 7   **If taking multiple medications, please check labels to avoid duplicating the same active ingredients **take medication as directed on the label ** Do not exceed 4000 mg of tylenol in 24 hours **Do not take medications that contain aspirin or ibuprofen      

## 2016-07-03 NOTE — Progress Notes (Signed)
Subjective:    Charlotte Mccormick is a G2P1001 [redacted]w[redacted]d being seen today for her first obstetrical visit.  Her obstetrical history is significant for hyperemesis this pregnacy w/multiple obs/IVF.  Pregnancy history fully reviewed.She says she "tore near rectum" w/last delivery and "still has a hole where I wasn't sewed up right, twice".  Will check to see if she had a 4th degree  Patient reports feeling much better.  not vomiting, but still tired.  Vitals:   07/03/16 1417  BP: 102/62  Pulse: 88  Weight: 156 lb (70.8 kg)    HISTORY: OB History  Gravida Para Term Preterm AB Living  2 1 1     1   SAB TAB Ectopic Multiple Live Births          1    # Outcome Date GA Lbr Len/2nd Weight Sex Delivery Anes PTL Lv  2 Current           1 Term 04/02/12 110w0d  7 lb 2 oz (3.232 kg) F Vag-Spont EPI N LIV     Past Medical History:  Diagnosis Date  . Abnormal uterine bleeding (AUB) 12/12/2015  . Anemia   . Anxiety   . Constipation   . Depression   . Fibroid   . Genital herpes   . GERD (gastroesophageal reflux disease)   . History of chlamydia   . PTSD (post-traumatic stress disorder)   . Sinusitis   . Vaginal dryness 12/12/2015   Past Surgical History:  Procedure Laterality Date  . COLONOSCOPY N/A 04/08/2016   Procedure: COLONOSCOPY;  Surgeon: Danie Binder, MD;  Location: [facility];  Service: Endoscopy;  Laterality: N/A;  10:15 AM  . ESOPHAGOGASTRODUODENOSCOPY N/A 04/08/2016   Procedure: ESOPHAGOGASTRODUODENOSCOPY (EGD);  Surgeon: Danie Binder, MD;  Location: [facility];  Service: Endoscopy;  Laterality: N/A;   Family History  Problem Relation Age of Onset  . Heart disease Mother   . Hypertension Mother   . Hypertension Father   . Hyperlipidemia Father   . Diabetes Father   . Epilepsy Brother   . Eczema Daughter   . Colon cancer Neg Hx      Exam                                      System:     Skin: normal coloration and turgor, no rashes    Neurologic:  oriented, normal, normal mood   Extremities: normal strength, tone, and muscle mass   HEENT PERRLA   Mouth/Teeth mucous membranes moist, normal dentition   Neck supple and no masses   Cardiovascular: regular rate and rhythm   Respiratory:  appears well, vitals normal, no respiratory distress, acyanotic   Abdomen: soft, non-tender;  FHR: 150          Assessment:    Pregnancy: G2P1001 Patient Active Problem List   Diagnosis Date Noted  . Hyperemesis gravidarum, antepartum 06/06/2016  . Encounter for supervision of other normal pregnancy 05/22/2016  . PTSD (post-traumatic stress disorder) 05/01/2016  . Heme positive stool 03/19/2016  . Gastroesophageal reflux disease 01/16/2016  . Constipation 01/16/2016  . Black stools 01/16/2016  . Abnormal uterine bleeding (AUB) 12/12/2015        Plan:      Continue prenatal vitamins  Problem list reviewed and updated  Reviewed n/v relief measures and warning s/s to report  Reviewed recommended weight gain based on  pre-gravid BMI  Encouraged well-balanced diet Genetic Screening discussed Integrated Screen: requested.  Ultrasound discussed; fetal survey: requested.  Return in about 3 weeks (around 07/24/2016) for LROB, 2nd IT; sign release from [facility] for delivery record.  CRESENZO-DISHMAN,Alejah Aristizabal 07/03/2016

## 2016-07-16 ENCOUNTER — Encounter (HOSPITAL_COMMUNITY): Payer: Self-pay

## 2016-07-16 ENCOUNTER — Inpatient Hospital Stay (HOSPITAL_COMMUNITY)
Admission: AD | Admit: 2016-07-16 | Discharge: 2016-07-17 | Disposition: A | Discharge status: Home / Self Care | Payer: Medicaid Other | Source: Ambulatory Visit | Attending: Obstetrics & Gynecology | Admitting: Obstetrics & Gynecology

## 2016-07-16 DIAGNOSIS — Z87891 Personal history of nicotine dependence: Secondary | ICD-10-CM | POA: Diagnosis not present

## 2016-07-16 DIAGNOSIS — E876 Hypokalemia: Secondary | ICD-10-CM | POA: Insufficient documentation

## 2016-07-16 DIAGNOSIS — R0602 Shortness of breath: Secondary | ICD-10-CM | POA: Diagnosis not present

## 2016-07-16 DIAGNOSIS — R112 Nausea with vomiting, unspecified: Secondary | ICD-10-CM | POA: Diagnosis not present

## 2016-07-16 DIAGNOSIS — Z880 Allergy status to penicillin: Secondary | ICD-10-CM | POA: Diagnosis not present

## 2016-07-16 LAB — CBC
HCT: 28.7 % — ABNORMAL LOW (ref 36.0–46.0)
Hemoglobin: 9.4 g/dL — ABNORMAL LOW (ref 12.0–15.0)
MCH: 22.3 pg — ABNORMAL LOW (ref 26.0–34.0)
MCHC: 32.8 g/dL (ref 30.0–36.0)
MCV: 68 fL — AB (ref 78.0–100.0)
PLATELETS: 302 10*3/uL (ref 150–400)
RBC: 4.22 MIL/uL (ref 3.87–5.11)
RDW: 16.3 % — AB (ref 11.5–15.5)
WBC: 7.7 10*3/uL (ref 4.0–10.5)

## 2016-07-16 LAB — URINALYSIS, ROUTINE W REFLEX MICROSCOPIC
Bilirubin Urine: NEGATIVE
GLUCOSE, UA: NEGATIVE mg/dL
HGB URINE DIPSTICK: NEGATIVE
KETONES UR: NEGATIVE mg/dL
LEUKOCYTES UA: NEGATIVE
Nitrite: NEGATIVE
PROTEIN: NEGATIVE mg/dL
Specific Gravity, Urine: 1.01 (ref 1.005–1.030)
pH: 7 (ref 5.0–8.0)

## 2016-07-16 MED ORDER — FAMOTIDINE IN NACL 20-0.9 MG/50ML-% IV SOLN
20.0000 mg | Freq: Once | INTRAVENOUS | Status: AC
  Administered 2016-07-17: 20 mg via INTRAVENOUS
  Filled 2016-07-16: qty 50

## 2016-07-16 NOTE — MAU Note (Signed)
Patient presents via EMS with c/o N/V for the past 4 days. Patient also states that she has been having trouble breathing for the past 2 week.

## 2016-07-17 ENCOUNTER — Telehealth: Payer: Self-pay | Admitting: *Deleted

## 2016-07-17 ENCOUNTER — Other Ambulatory Visit: Payer: Self-pay | Admitting: Advanced Practice Midwife

## 2016-07-17 DIAGNOSIS — R112 Nausea with vomiting, unspecified: Secondary | ICD-10-CM

## 2016-07-17 DIAGNOSIS — R0602 Shortness of breath: Secondary | ICD-10-CM

## 2016-07-17 LAB — COMPREHENSIVE METABOLIC PANEL
ALK PHOS: 69 U/L (ref 38–126)
ALT: 14 U/L (ref 14–54)
ANION GAP: 7 (ref 5–15)
AST: 17 U/L (ref 15–41)
Albumin: 3.2 g/dL — ABNORMAL LOW (ref 3.5–5.0)
BILIRUBIN TOTAL: 0.2 mg/dL — AB (ref 0.3–1.2)
BUN: 7 mg/dL (ref 6–20)
CALCIUM: 8.6 mg/dL — AB (ref 8.9–10.3)
CO2: 24 mmol/L (ref 22–32)
Chloride: 105 mmol/L (ref 101–111)
Creatinine, Ser: 0.41 mg/dL — ABNORMAL LOW (ref 0.44–1.00)
GFR calc non Af Amer: >60 mL/min (ref >60)
Glucose, Bld: 105 mg/dL — ABNORMAL HIGH (ref 65–99)
Potassium: 3.1 mmol/L — ABNORMAL LOW (ref 3.5–5.1)
Sodium: 136 mmol/L (ref 135–145)
TOTAL PROTEIN: 7.1 g/dL (ref 6.5–8.1)

## 2016-07-17 MED ORDER — POTASSIUM CHLORIDE ER 10 MEQ PO TBCR: 40.0000 meq | EXTENDED_RELEASE_TABLET | Freq: Two times a day (BID) | ORAL | 0 refills | Status: DC

## 2016-07-17 MED ORDER — PROMETHAZINE HCL 25 MG/ML IJ SOLN
25.0000 mg | Freq: Four times a day (QID) | INTRAMUSCULAR | Status: DC | PRN
  Filled 2016-07-17: qty 1

## 2016-07-17 MED ORDER — POTASSIUM CHLORIDE CRYS ER 20 MEQ PO TBCR
40.0000 meq | EXTENDED_RELEASE_TABLET | Freq: Once | ORAL | Status: AC
Start: 2016-07-17 — End: 2016-07-17
  Administered 2016-07-17: 40 meq via ORAL
  Filled 2016-07-17: qty 2

## 2016-07-17 MED ORDER — PROMETHAZINE HCL 6.25 MG/5ML PO SYRP: 6.2500 mg | ORAL_SOLUTION | Freq: Four times a day (QID) | ORAL | 0 refills | Status: DC | PRN

## 2016-07-17 MED ORDER — POTASSIUM PHOSPHATES 15 MMOLE/5ML IV SOLN: 40.0000 meq | Freq: Once | INTRAVENOUS | Status: DC

## 2016-07-17 MED ORDER — ONDANSETRON HCL 8 MG PO TABS: 8.0000 mg | ORAL_TABLET | Freq: Three times a day (TID) | ORAL | 0 refills | Status: DC | PRN

## 2016-07-17 MED ORDER — ALBUTEROL SULFATE HFA 108 (90 BASE) MCG/ACT IN AERS: 2.0000 | INHALATION_SPRAY | Freq: Four times a day (QID) | RESPIRATORY_TRACT | 0 refills | Status: DC | PRN

## 2016-07-17 NOTE — Discharge Instructions (Signed)
Hypokalemia Hypokalemia means that the amount of potassium in the blood is lower than normal.Potassium is a chemical that helps regulate the amount of fluid in the body (electrolyte). It also stimulates muscle tightening (contraction) and helps nerves work properly.Normally, most of the bodys potassium is inside of cells, and only a very small amount is in the blood. Because the amount in the blood is so small, minor changes to potassium levels in the blood can be life-threatening. What are the causes? This condition may be caused by:  Antibiotic medicine.  Diarrhea or vomiting. Taking too much of a medicine that helps you have a bowel movement (laxative) can cause diarrhea and lead to hypokalemia.  Chronic kidney disease (CKD).  Medicines that help the body get rid of excess fluid (diuretics).  Eating disorders, such as bulimia.  Low magnesium levels in the body.  Sweating a lot. What are the signs or symptoms? Symptoms of this condition include:  Weakness.  Constipation.  Fatigue.  Muscle cramps.  Mental confusion.  Skipped heartbeats or irregular heartbeat (palpitations).  Tingling or numbness. How is this diagnosed? This condition is diagnosed with a blood test. How is this treated? Hypokalemia can be treated by taking potassium supplements by mouth or adjusting the medicines that you take. Treatment may also include eating more foods that contain a lot of potassium. If your potassium level is very low, you may need to get potassium through an IV tube in one of your veins and be monitored in the hospital. Follow these instructions at home:  Take over-the-counter and prescription medicines only as told by your health care provider. This includes vitamins and supplements.  Eat a healthy diet. A healthy diet includes fresh fruits and vegetables, whole grains, healthy fats, and lean proteins.  If instructed, eat more foods that contain a lot of potassium, such  as:  Nuts, such as peanuts and pistachios.  Seeds, such as sunflower seeds and pumpkin seeds.  Peas, lentils, and lima beans.  Whole grain and bran cereals and breads.  Fresh fruits and vegetables, such as apricots, avocado, bananas, cantaloupe, kiwi, oranges, tomatoes, asparagus, and potatoes.  Orange juice.  Tomato juice.  Red meats.  Yogurt.  Keep all follow-up visits as told by your health care provider. This is important. Contact a health care provider if:  You have weakness that gets worse.  You feel your heart pounding or racing.  You vomit.  You have diarrhea.  You have diabetes (diabetes mellitus) and you have trouble keeping your blood sugar (glucose) in your target range. Get help right away if:  You have chest pain.  You have shortness of breath.  You have vomiting or diarrhea that lasts for more than 2 days.  You faint. This information is not intended to replace advice given to you by your health care provider. Make sure you discuss any questions you have with your health care provider. Document Released: 05/20/2005 Document Revised: 01/06/2016 Document Reviewed: 01/06/2016 Elsevier Interactive Patient Education  2017 [employer].    Follow up with [facility] this week or next week  Add Zofran to your phenergan Take potassium replacements 2 times a day for three days

## 2016-07-17 NOTE — Telephone Encounter (Signed)
Informed patient prescription was sent to pharmacy. 

## 2016-07-17 NOTE — Telephone Encounter (Signed)
rx sent

## 2016-07-17 NOTE — Telephone Encounter (Signed)
Patient called stating she was seen at [facility] last night for syncopal episode, nausea, vomiting and shortness of breath. She was advised to f/u with [place] to get prescription for inhaler. She used her daughter's nebulizer last night which she states helped. Please review discharge notes and advise.

## 2016-07-17 NOTE — MAU Provider Note (Signed)
Patient Charlotte Mccormick is a 31 year old G2P1001 here with complaints of Nausea and vomiting for the past 4 days. She has felt weak and short of breath for the past two weeks. She has a history of hyperemesis in this pregnancy. She was on phenergan but stopped taking it because it didn't work.  History   The patient states that for the past two weeks she has felt shortness of breath both on exertion and while at rest. Tonight she felt weak and tried her daughter's albuterol inhaler, which relieved her shortness of breath. This evening she also had a syncopal episode and was brought in by EMS. She is also complaining of nausea and vomiting that happens around 3 pm every day. Today she ate a sausage biscuit and a hamburger and leftover steak and cheese, but states that she threw it all up since 3 pm. She usually vomites when she gets home from work.  CSN: [id number]  Arrival date and time: 07/16/16 2252   First Provider Initiated Contact with Patient 07/16/16 2314      Chief Complaint  Patient presents with  . Emesis   HPI  OB History    Gravida Para Term Preterm AB Living   2 1 1     1    SAB TAB Ectopic Multiple Live Births           1      Past Medical History:  Diagnosis Date  . Abnormal uterine bleeding (AUB) 12/12/2015  . Anemia   . Anxiety   . Constipation   . Depression   . Fibroid   . Genital herpes   . GERD (gastroesophageal reflux disease)   . History of chlamydia   . PTSD (post-traumatic stress disorder)   . Sinusitis   . Vaginal dryness 12/12/2015    Past Surgical History:  Procedure Laterality Date  . COLONOSCOPY N/A 04/08/2016   Procedure: COLONOSCOPY;  Surgeon: Danie Binder, MD;  Location: [facility];  Service: Endoscopy;  Laterality: N/A;  10:15 AM  . ESOPHAGOGASTRODUODENOSCOPY N/A 04/08/2016   Procedure: ESOPHAGOGASTRODUODENOSCOPY (EGD);  Surgeon: Danie Binder, MD;  Location: [facility];  Service: Endoscopy;  Laterality: N/A;    Family History   Problem Relation Age of Onset  . Heart disease Mother   . Hypertension Mother   . Hypertension Father   . Hyperlipidemia Father   . Diabetes Father   . Epilepsy Brother   . Eczema Daughter   . Colon cancer Neg Hx     Social History  Substance Use Topics  . Smoking status: Former Smoker    Packs/day: 0.50    Years: 5.00    Types: Cigarettes    Quit date: 06/03/2010  . Smokeless tobacco: Never Used  . Alcohol use No     Comment: 03/19/16 currently no ETOH due to meds; Previously: Wine three times a week    Allergies:  Allergies  Allergen Reactions  . Bee Venom Other (See Comments)    Reaction:  Localized swelling   . Penicillins Itching and Other (See Comments)    Has patient had a PCN reaction causing immediate rash, facial/tongue/throat swelling, SOB or lightheadedness with hypotension: No Has patient had a PCN reaction causing severe rash involving mucus membranes or skin necrosis: No Has patient had a PCN reaction that required hospitalization No Has patient had a PCN reaction occurring within the last 10 years: Yes If all of the above answers are "NO", then may proceed with Cephalosporin  use.     Facility-Administered Medications Prior to Admission  Medication Dose Route Frequency Provider Last Rate Last Dose  . 0.9 %  sodium chloride infusion   Intravenous Once Mallory Shirk V, MD      . dextrose 5 % and 0.45% NaCl 5-0.45 % bolus 1,000 mL  1,000 mL Intravenous Once [name], MD       Prescriptions Prior to Admission  Medication Sig Dispense Refill Last Dose  . buPROPion (WELLBUTRIN) 100 MG tablet Take 100 mg by mouth 2 (two) times daily.   Not Taking  . doxylamine, Sleep, (UNISOM) 25 MG tablet Take 25 mg by mouth at bedtime as needed.   Not Taking  . multivitamin (VIT W/EXTRA C) CHEW chewable tablet Chew 1 tablet by mouth daily.   Taking  . pantoprazole (PROTONIX) 40 MG tablet Take 1 tablet (40 mg total) by mouth daily. (Patient not taking: Reported on  07/03/2016) 30 tablet 6 Not Taking  . promethazine (PHENERGAN) 6.25 MG/5ML syrup TAKE 10 MLS BY MOUTH EVERY 4 HOURS AS NEEDED FOR NAUSEA AND VOMITING 240 mL 11 Taking  . pyridOXINE (VITAMIN B-6) 100 MG tablet Take 100 mg by mouth 2 (two) times daily.   Taking    Review of Systems  Constitutional: Positive for fatigue. Negative for activity change.  HENT: Negative.   Eyes: Negative.   Respiratory: Positive for shortness of breath.   Cardiovascular: Negative.   Gastrointestinal: Positive for nausea and vomiting.  Endocrine: Negative.   Genitourinary: Negative.   Musculoskeletal: Negative.   Allergic/Immunologic: Negative.   Neurological: Negative.   Hematological: Negative.   Psychiatric/Behavioral: Negative.    Physical Exam   Blood pressure 128/76, pulse 105, temperature 98.2 F (36.8 C), temperature source Oral, resp. rate 16, last menstrual period 03/23/2016, SpO2 100 %.  Physical Exam  Constitutional: She is oriented to person, place, and time. She appears well-developed and well-nourished.  HENT:  Head: Normocephalic.  Neck: Normal range of motion.  Cardiovascular: Normal rate and regular rhythm.   Respiratory: Effort normal and breath sounds normal. No respiratory distress. She has no wheezes. She has no rales. She exhibits no tenderness.  GI: Soft. She exhibits no distension and no mass. There is no tenderness. There is no rebound and no guarding.  Musculoskeletal: Normal range of motion.  Neurological: She is alert and oriented to person, place, and time.  Skin: Skin is warm and dry.  Psychiatric: She has a normal mood and affect.    MAU Course  Procedures  MDM -IV pepcid, patient says her heart burn is relieved; patient refusing IV phenergan -FHR is 150 bpm -EKG-normal sinus rhythm Patient received 40 meq of potassium chloride PO, tolerated well    Results for orders placed or performed during the hospital encounter of 07/16/16 (from the past 24 hour(s))  CBC      Status: Abnormal   Collection Time: 07/16/16 11:24 PM  Result Value Ref Range   WBC 7.7 4.0 - 10.5 K/uL   RBC 4.22 3.87 - 5.11 MIL/uL   Hemoglobin 9.4 (L) 12.0 - 15.0 g/dL   HCT 28.7 (L) 36.0 - 46.0 %   MCV 68.0 (L) 78.0 - 100.0 fL   MCH 22.3 (L) 26.0 - 34.0 pg   MCHC 32.8 30.0 - 36.0 g/dL   RDW 16.3 (H) 11.5 - 15.5 %   Platelets 302 150 - 400 K/uL  Comprehensive metabolic panel     Status: Abnormal   Collection Time: 07/16/16 11:24 PM  Result Value Ref Range   Sodium 136 135 - 145 mmol/L   Potassium 3.1 (L) 3.5 - 5.1 mmol/L   Chloride 105 101 - 111 mmol/L   CO2 24 22 - 32 mmol/L   Glucose, Bld 105 (H) 65 - 99 mg/dL   BUN 7 6 - 20 mg/dL   Creatinine, Ser 0.41 (L) 0.44 - 1.00 mg/dL   Calcium 8.6 (L) 8.9 - 10.3 mg/dL   Total Protein 7.1 6.5 - 8.1 g/dL   Albumin 3.2 (L) 3.5 - 5.0 g/dL   AST 17 15 - 41 U/L   ALT 14 14 - 54 U/L   Alkaline Phosphatase 69 38 - 126 U/L   Total Bilirubin 0.2 (L) 0.3 - 1.2 mg/dL   GFR calc non Af Amer >60 >60 mL/min   GFR calc Af Amer >60 >60 mL/min   Anion gap 7 5 - 15  Urinalysis, Routine w reflex microscopic     Status: Abnormal   Collection Time: 07/16/16 11:50 PM  Result Value Ref Range   Color, Urine YELLOW YELLOW   APPearance HAZY (A) CLEAR   Specific Gravity, Urine 1.010 1.005 - 1.030   pH 7.0 5.0 - 8.0   Glucose, UA NEGATIVE NEGATIVE mg/dL   Hgb urine dipstick NEGATIVE NEGATIVE   Bilirubin Urine NEGATIVE NEGATIVE   Ketones, ur NEGATIVE NEGATIVE mg/dL   Protein, ur NEGATIVE NEGATIVE mg/dL   Nitrite NEGATIVE NEGATIVE   Leukocytes, UA NEGATIVE NEGATIVE     Assessment and Plan  Hypokalemia  2. Patient stable for discharge with RX for Zofran, phenergan syrup, OTC pepcid, and potassium replacement and recommendation to begin iron supplementation for anemia (Floradix).  3. Recommend that the patient follow up with [facility] this week or early next week.  4. Reviewed when to return to the MAU (bleeding, cramping,  increased shortness of  breath).   Mervyn Skeeters Mischele Detter CNM 07/17/2016, 12:49 AM

## 2016-07-18 ENCOUNTER — Ambulatory Visit (INDEPENDENT_AMBULATORY_CARE_PROVIDER_SITE_OTHER): Payer: Medicaid Other | Admitting: Women's Health

## 2016-07-18 ENCOUNTER — Encounter: Payer: Self-pay | Admitting: Women's Health

## 2016-07-18 VITALS — BP 118/60 | HR 84

## 2016-07-18 DIAGNOSIS — O99511 Diseases of the respiratory system complicating pregnancy, first trimester: Secondary | ICD-10-CM

## 2016-07-18 DIAGNOSIS — R079 Chest pain, unspecified: Secondary | ICD-10-CM | POA: Diagnosis not present

## 2016-07-18 DIAGNOSIS — O9989 Other specified diseases and conditions complicating pregnancy, childbirth and the puerperium: Secondary | ICD-10-CM

## 2016-07-18 DIAGNOSIS — Z1389 Encounter for screening for other disorder: Secondary | ICD-10-CM | POA: Diagnosis not present

## 2016-07-18 DIAGNOSIS — Z331 Pregnant state, incidental: Secondary | ICD-10-CM

## 2016-07-18 DIAGNOSIS — Z3A17 17 weeks gestation of pregnancy: Secondary | ICD-10-CM

## 2016-07-18 DIAGNOSIS — R0602 Shortness of breath: Secondary | ICD-10-CM

## 2016-07-18 LAB — POCT URINALYSIS DIPSTICK
Blood, UA: NEGATIVE
Glucose, UA: NEGATIVE
Ketones, UA: NEGATIVE
Leukocytes, UA: NEGATIVE
NITRITE UA: NEGATIVE

## 2016-07-18 MED ORDER — BUPROPION HCL 100 MG PO TABS: 100.0000 mg | ORAL_TABLET | Freq: Two times a day (BID) | ORAL | 6 refills | Status: DC

## 2016-07-18 NOTE — Progress Notes (Signed)
[facility] Visit  Patient name: Charlotte Mccormick MRN [id number]  Date of birth: 02-08-1986  CC & HPI:  Charlotte Mccormick is a 31 y.o. G28P1001 African American female at [redacted]w[redacted]d presenting today as walk-in to front desk with report of chest pain, sob, feeling weak- thinks it's anxiety related, wants refill on wellbutrin. Was at work when [employer] started, her work called EMS, when they got there she states they told her could possibly be anxiety, so she said she did not want to go to the hospital and stay there for hours if it was anxiety. Reports she took a tylenol which resolved chest pain, only sob now when she stands up/moves. Reported cp as sharp constant and points to center of upper chest. States she went to [place] night/into yesterday am for sob and weakness/syncopal episode- brought in by EMS- was given IVF, IV pepcid, 77meq K+PO for K+ of 3.1, was rx'd Floradix for Hgb 9.4 as well as K+- states she has picked up K+ but not Floradix yet. Took pepcid this am. States she doesn't know if this chest pain she had feels like reflux or not. Has had multiple office and hospital visits for hyperemesis to date, states she currently has not vomited in 3 days! 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  Her significant other is present w/ her today Patient's last menstrual period was 03/23/2016.  Pertinent History Reviewed:  Medical & Surgical Hx:   Past medical, surgical, family, and social history reviewed in electronic medical record Medications: Reviewed & Updated - see associated section Allergies: Reviewed in electronic medical record  Objective Findings:  Vitals: BP 118/60   Pulse 84   LMP 03/23/2016  There is no height or weight on file to calculate BMI. Pulse ox on RA: 98-99%, HR on pulse ox 107- both stayed consistent w/ standing/walking  Physical Examination: General appearance - alert, cooperative Mental status - alert, oriented to person, place, and  time Chest - clear to auscultation, no wheezes, rales or rhonchi, symmetric air entry. NO tenderness to palpation of chest Heart - normal rate and regular rhythm FHR: 150 Got to stand from sitting position in chair to assess hr/pulse ox- both stable, pt appeared slightly unstable on feet/mildly sob- no acute distress- states she just feels weak.   Results for orders placed or performed in visit on 07/18/16 (from the past 24 hour(s))  POCT urinalysis dipstick   Collection Time: 07/18/16  4:08 PM  Result Value Ref Range   Color, UA     Clarity, UA     Glucose, UA neg    Bilirubin, UA     Ketones, UA neg    Spec Grav, UA     Blood, UA neg    pH, UA     Protein, UA trace    Urobilinogen, UA     Nitrite, UA neg    Leukocytes, UA Negative Negative     Assessment & Plan:  A:   [redacted]w[redacted]d pregnant  Chest pain, sob  Recent hypokalemia and anemia dx 2/13 at MAU  H/O anxiety- wants wellbutrin refilled  Hyperemesis- improving  P:  Strongly advised pt go to ED/MAU for further evaluation of sx- pt refuses at this time, spoke w/ significant other directly and notified him also I feel she should be evaluated at ED/MAU- he verbalized understanding  No current chest pain, mild sob on exertion- no change in pule ox  Low  suspicion for PE as HR and pulse ox are normal, do not feel this is anxiety related, could possibly be r/t anemia/hypokalemia  Refilled wellbutrin as pt requested  To get Floradix rx filled and begin taking asap  Continue current regimen for hyperemesis  Return for as scheduled.  Tawnya Crook CNM, [facility] 07/18/2016 4:52 PM

## 2016-07-18 NOTE — Patient Instructions (Signed)
I recommend you go to [facility] right now  Please get your iron filled and start taking

## 2016-07-19 ENCOUNTER — Inpatient Hospital Stay (HOSPITAL_COMMUNITY)
Admission: AD | Admit: 2016-07-19 | Discharge: 2016-07-19 | Disposition: A | Discharge status: Home / Self Care | Payer: Medicaid Other | Source: Ambulatory Visit | Attending: Family Medicine | Admitting: Family Medicine

## 2016-07-19 ENCOUNTER — Encounter: Payer: Self-pay | Admitting: Women's Health

## 2016-07-19 ENCOUNTER — Encounter (HOSPITAL_COMMUNITY): Payer: Self-pay

## 2016-07-19 DIAGNOSIS — O26892 Other specified pregnancy related conditions, second trimester: Secondary | ICD-10-CM | POA: Diagnosis present

## 2016-07-19 DIAGNOSIS — Z87891 Personal history of nicotine dependence: Secondary | ICD-10-CM | POA: Diagnosis not present

## 2016-07-19 DIAGNOSIS — Z3A16 16 weeks gestation of pregnancy: Secondary | ICD-10-CM | POA: Insufficient documentation

## 2016-07-19 DIAGNOSIS — O219 Vomiting of pregnancy, unspecified: Secondary | ICD-10-CM | POA: Diagnosis not present

## 2016-07-19 DIAGNOSIS — K219 Gastro-esophageal reflux disease without esophagitis: Secondary | ICD-10-CM | POA: Diagnosis not present

## 2016-07-19 DIAGNOSIS — O99612 Diseases of the digestive system complicating pregnancy, second trimester: Secondary | ICD-10-CM | POA: Insufficient documentation

## 2016-07-19 DIAGNOSIS — Z3481 Encounter for supervision of other normal pregnancy, first trimester: Secondary | ICD-10-CM

## 2016-07-19 LAB — COMPREHENSIVE METABOLIC PANEL
ALK PHOS: 74 U/L (ref 38–126)
ALT: 15 U/L (ref 14–54)
AST: 21 U/L (ref 15–41)
Albumin: 3.3 g/dL — ABNORMAL LOW (ref 3.5–5.0)
Anion gap: 11 (ref 5–15)
BUN: 7 mg/dL (ref 6–20)
CALCIUM: 9.2 mg/dL (ref 8.9–10.3)
CHLORIDE: 102 mmol/L (ref 101–111)
CO2: 22 mmol/L (ref 22–32)
CREATININE: 0.49 mg/dL (ref 0.44–1.00)
GFR calc non Af Amer: >60 mL/min (ref >60)
Glucose, Bld: 74 mg/dL (ref 65–99)
Potassium: 4.1 mmol/L (ref 3.5–5.1)
SODIUM: 135 mmol/L (ref 135–145)
Total Bilirubin: 0.5 mg/dL (ref 0.3–1.2)
Total Protein: 7.6 g/dL (ref 6.5–8.1)

## 2016-07-19 LAB — CBC
HCT: 32.2 % — ABNORMAL LOW (ref 36.0–46.0)
HEMOGLOBIN: 10.4 g/dL — AB (ref 12.0–15.0)
MCH: 22.2 pg — AB (ref 26.0–34.0)
MCHC: 32.3 g/dL (ref 30.0–36.0)
MCV: 68.7 fL — AB (ref 78.0–100.0)
PLATELETS: 346 10*3/uL (ref 150–400)
RBC: 4.69 MIL/uL (ref 3.87–5.11)
RDW: 16.6 % — ABNORMAL HIGH (ref 11.5–15.5)
WBC: 8.7 10*3/uL (ref 4.0–10.5)

## 2016-07-19 LAB — URINALYSIS, ROUTINE W REFLEX MICROSCOPIC
Bilirubin Urine: NEGATIVE
Glucose, UA: NEGATIVE mg/dL
Hgb urine dipstick: NEGATIVE
Ketones, ur: 80 mg/dL — AB
Nitrite: NEGATIVE
PH: 7 (ref 5.0–8.0)
Protein, ur: NEGATIVE mg/dL
SPECIFIC GRAVITY, URINE: 1.024 (ref 1.005–1.030)

## 2016-07-19 MED ORDER — FAMOTIDINE IN NACL 20-0.9 MG/50ML-% IV SOLN
20.0000 mg | Freq: Once | INTRAVENOUS | Status: AC
  Administered 2016-07-19: 20 mg via INTRAVENOUS
  Filled 2016-07-19: qty 50

## 2016-07-19 MED ORDER — PROMETHAZINE HCL 25 MG/ML IJ SOLN
25.0000 mg | Freq: Once | INTRAVENOUS | Status: AC
  Administered 2016-07-19: 25 mg via INTRAVENOUS
  Filled 2016-07-19: qty 1

## 2016-07-19 MED ORDER — LACTATED RINGERS IV BOLUS (SEPSIS)
1000.0000 mL | Freq: Once | INTRAVENOUS | Status: AC
  Administered 2016-07-19: 1000 mL via INTRAVENOUS

## 2016-07-19 MED ORDER — METOCLOPRAMIDE HCL 10 MG PO TABS: 10.0000 mg | ORAL_TABLET | Freq: Four times a day (QID) | ORAL | 0 refills | Status: DC

## 2016-07-19 NOTE — Progress Notes (Signed)
IV attempt x1 in right hand by Nolon Stalls with 22 gauge. IV attempt in right antecubital with 20 gauge by Henri Medal. IV attempt in left antecubital with 22 gauge by Eliezer Lofts. CRNA called to come assess for IV placement.

## 2016-07-19 NOTE — MAU Note (Signed)
Pt states she is very tired, unable to stand, has been vomiting since Saturday, denies diarrhea.  Has chest pain @ times, took tylenol earlier, states it helped.  Also C/O SOB, states her MD advised her to come.  Talking very softly, difficult to hear but respirations appear normal.

## 2016-07-19 NOTE — Discharge Instructions (Signed)
Eating Plan for Nausea and Vomiting in Pregnancy Hyperemesis gravidarum is a severe form of morning sickness. Because this condition causes severe nausea and vomiting, it can lead to dehydration, malnutrition, and weight loss. One way to lessen the symptoms of nausea and vomiting is to follow the eating plan for hyperemesis gravidarum. It is often used along with prescribed medicines to control your symptoms. What can I do to relieve my symptoms? Listen to your body. Everyone is different and has different preferences. Find what works best for you. Take any of the following actions that are helpful to you:  Eat and drink slowly.  Eat 5-6 small meals daily instead of 3 large meals.  Eat crackers before you get out of bed in the morning.  Try having a snack in the middle of the night.  Starchy foods are usually tolerated well. Examples include cereal, toast, bread, potatoes, pasta, rice, and pretzels.  Ginger may help with nausea. Add  tsp ground ginger to hot tea or choose ginger tea.  Try drinking 100% fruit juice or an electrolyte drink. An electrolyte drink contains sodium, potassium, and chloride.  Continue to take your prenatal vitamins as told by your health care provider. If you are having trouble taking your prenatal vitamins, talk with your health care provider about different options.  Include at least 1 serving of protein with your meals and snacks. Protein options include meats or poultry, beans, nuts, eggs, and yogurt. Try eating a protein-rich snack before bed. Examples of these snacks include cheese and crackers or half of a peanut butter or [place] sandwich.  Consider eliminating foods that trigger your symptoms. These may include spicy foods, coffee, high-fat foods, very sweet foods, and acidic foods.  Try meals that have more protein combined with bland, salty, lower-fat, and dry foods, such as nuts, seeds, pretzels, crackers, and cereal.  Talk with your healthcare  provider about starting a supplement of vitamin B6.  Have fluids that are cold, clear, and carbonated or sour. Examples include lemonade, ginger ale, lemon-lime soda, ice water, and sparkling water.  Try lemon or mint tea.  Try brushing your teeth or using a mouth rinse after meals. What should I avoid to reduce my symptoms? Avoiding some of the following things may help reduce your symptoms.  Foods with strong smells. Try eating meals in well-ventilated areas that are free of odors.  Drinking water or other beverages with meals. Try not to drink anything during the 30 minutes before and after your meals.  Drinking more than 1 cup of fluid at a time. Sometimes using a straw helps.  Fried or high-fat foods, such as butter and cream sauces.  Spicy foods.  Skipping meals as best as you can. Nausea can be more intense on an empty stomach. If you cannot tolerate food at that time, do not force it. Try sucking on ice chips or other frozen items, and make up for missed calories later.  Lying down within 2 hours after eating.  Environmental triggers. These may include smoky rooms, closed spaces, rooms with strong smells, warm or humid places, overly loud and noisy rooms, and rooms with motion or flickering lights.  Quick and sudden changes in your movement. This information is not intended to replace advice given to you by your health care provider. Make sure you discuss any questions you have with your health care provider. Document Released: 03/17/2007 Document Revised: 01/17/2016 Document Reviewed: 12/19/2015 Elsevier Interactive Patient Education  2017 [employer].

## 2016-07-19 NOTE — Progress Notes (Signed)
Pt didn't want wheelchair to car.  Pt ambulated out of room and to lobby and significant other drove around to MAU circle and pt ambulated well.  Discussed with her small frequent meals and drinking plenty of water.  Pt voiced understanding.

## 2016-07-19 NOTE — MAU Provider Note (Signed)
History     CSN: [id number]  Arrival date and time: 07/19/16 1139   First Provider Initiated Contact with Patient 07/19/16 1419      Chief Complaint  Patient presents with  . Fatigue  . Emesis During Pregnancy  . Shortness of Breath   HPI [name] is a 31 y.o. G2P1001 at [redacted]w[redacted]d who presents to MAU today with complaint of weakness and N/V. This has been an issue throughout the pregnancy. She states that she has had a steroid taper and has tried Phenergan and Zofran with minimal relief. Last dose of either medication was this morning. She states that she was told to increase her dietary protein and has been eating hamburgers, eggs, bacon and nuts frequently. She has also had chest pain off and on due to GERD. She states she feels so weak that she has difficulty walking. She also complains of some intermittent abdominal cramping which she was told was due to a uterine fibroid. She denies vaginal bleeding, fever, diarrhea today. She has had one episode of emesis. She states that she has lost weight, but in review of her chart she has not.   OB History    Gravida Para Term Preterm AB Living   2 1 1     1    SAB TAB Ectopic Multiple Live Births           1      Past Medical History:  Diagnosis Date  . Abnormal uterine bleeding (AUB) 12/12/2015  . Anemia   . Anxiety   . Constipation   . Depression   . Fibroid   . Genital herpes   . GERD (gastroesophageal reflux disease)   . History of chlamydia   . PTSD (post-traumatic stress disorder)   . Sinusitis   . Vaginal dryness 12/12/2015    Past Surgical History:  Procedure Laterality Date  . COLONOSCOPY N/A 04/08/2016   Procedure: COLONOSCOPY;  Surgeon: Danie Binder, MD;  Location: [facility];  Service: Endoscopy;  Laterality: N/A;  10:15 AM  . ESOPHAGOGASTRODUODENOSCOPY N/A 04/08/2016   Procedure: ESOPHAGOGASTRODUODENOSCOPY (EGD);  Surgeon: Danie Binder, MD;  Location: [facility];  Service: Endoscopy;  Laterality:  N/A;    Family History  Problem Relation Age of Onset  . Heart disease Mother   . Hypertension Mother   . Hypertension Father   . Hyperlipidemia Father   . Diabetes Father   . Epilepsy Brother   . Eczema Daughter   . Colon cancer Neg Hx     Social History  Substance Use Topics  . Smoking status: Former Smoker    Packs/day: 0.50    Years: 5.00    Types: Cigarettes    Quit date: 06/03/2010  . Smokeless tobacco: Never Used  . Alcohol use No     Comment: 03/19/16 currently no ETOH due to meds; Previously: Wine three times a week    Allergies:  Allergies  Allergen Reactions  . Bee Venom Other (See Comments)    Reaction:  Localized swelling   . Penicillins Itching and Other (See Comments)    Has patient had a PCN reaction causing immediate rash, facial/tongue/throat swelling, SOB or lightheadedness with hypotension: No Has patient had a PCN reaction causing severe rash involving mucus membranes or skin necrosis: No Has patient had a PCN reaction that required hospitalization No Has patient had a PCN reaction occurring within the last 10 years: Yes If all of the above answers are "NO", then may  proceed with Cephalosporin use.     Prescriptions Prior to Admission  Medication Sig Dispense Refill Last Dose  . acetaminophen (TYLENOL) 500 MG tablet Take 500 mg by mouth every 6 (six) hours as needed for mild pain or headache.   07/18/2016 at Unknown time  . albuterol (PROVENTIL HFA;VENTOLIN HFA) 108 (90 Base) MCG/ACT inhaler Inhale 2 puffs into the lungs every 6 (six) hours as needed for wheezing or shortness of breath. 1 Inhaler 0 07/18/2016 at Unknown time  . buPROPion (WELLBUTRIN) 100 MG tablet Take 1 tablet (100 mg total) by mouth 2 (two) times daily. (Patient not taking: Reported on 07/19/2016) 60 tablet 6 Not Taking at Unknown time  . doxylamine, Sleep, (UNISOM) 25 MG tablet Take 25 mg by mouth at bedtime as needed.   Past Month at Unknown time  . famotidine (PEPCID) 20 MG tablet  Take 20 mg by mouth 2 (two) times daily.   07/19/2016 at Unknown time  . metoCLOPramide (REGLAN) 10 MG tablet Take 1 tablet (10 mg total) by mouth every 6 (six) hours. 30 tablet 0   . multivitamin (VIT W/EXTRA C) CHEW chewable tablet Chew 1 tablet by mouth at bedtime as needed and may repeat dose one time if needed.    07/18/2016 at Unknown time  . ondansetron (ZOFRAN) 8 MG tablet Take 1 tablet (8 mg total) by mouth every 8 (eight) hours as needed for nausea or vomiting. 20 tablet 0 07/19/2016 at Unknown time  . pantoprazole (PROTONIX) 40 MG tablet Take 1 tablet (40 mg total) by mouth daily. (Patient not taking: Reported on 07/03/2016) 30 tablet 6 Not Taking  . potassium chloride (K-DUR) 10 MEQ tablet Take 4 tablets (40 mEq total) by mouth 2 (two) times daily. 24 tablet 0 07/18/2016 at Unknown time  . promethazine (PHENERGAN) 6.25 MG/5ML syrup TAKE 10 MLS BY MOUTH EVERY 4 HOURS AS NEEDED FOR NAUSEA AND VOMITING 240 mL 11 07/19/2016 at Unknown time  . pyridOXINE (VITAMIN B-6) 100 MG tablet Take 100 mg by mouth 2 (two) times daily.   07/19/2016 at Unknown time    Review of Systems  Constitutional: Negative for fever.  Gastrointestinal: Positive for abdominal pain, constipation, nausea and vomiting. Negative for diarrhea.  Genitourinary: Negative for dysuria, frequency, urgency, vaginal bleeding and vaginal discharge.   Physical Exam   Blood pressure 121/72, pulse 109, temperature 97.7 F (36.5 C), temperature source Oral, resp. rate 16, height 5\' 2"  (1.575 m), weight 161 lb (73 kg), last menstrual period 03/23/2016, SpO2 100 %.  Physical Exam  Nursing note and vitals reviewed. Constitutional: She is oriented to person, place, and time. She appears well-developed. No distress.  HENT:  Head: Normocephalic and atraumatic.  Cardiovascular: Normal rate.   Respiratory: Effort normal.  GI: Soft. She exhibits no distension and no mass. There is no tenderness. There is no rebound and no guarding.   Neurological: She is alert and oriented to person, place, and time.  Skin: Skin is warm and dry. No erythema.  Psychiatric: She has a normal mood and affect.    Results for orders placed or performed during the hospital encounter of 07/19/16 (from the past 24 hour(s))  CBC     Status: Abnormal   Collection Time: 07/19/16  1:36 PM  Result Value Ref Range   WBC 8.7 4.0 - 10.5 K/uL   RBC 4.69 3.87 - 5.11 MIL/uL   Hemoglobin 10.4 (L) 12.0 - 15.0 g/dL   HCT 32.2 (L) 36.0 - 46.0 %  MCV 68.7 (L) 78.0 - 100.0 fL   MCH 22.2 (L) 26.0 - 34.0 pg   MCHC 32.3 30.0 - 36.0 g/dL   RDW 16.6 (H) 11.5 - 15.5 %   Platelets 346 150 - 400 K/uL  Comprehensive metabolic panel     Status: Abnormal   Collection Time: 07/19/16  1:36 PM  Result Value Ref Range   Sodium 135 135 - 145 mmol/L   Potassium 4.1 3.5 - 5.1 mmol/L   Chloride 102 101 - 111 mmol/L   CO2 22 22 - 32 mmol/L   Glucose, Bld 74 65 - 99 mg/dL   BUN 7 6 - 20 mg/dL   Creatinine, Ser 0.49 0.44 - 1.00 mg/dL   Calcium 9.2 8.9 - 10.3 mg/dL   Total Protein 7.6 6.5 - 8.1 g/dL   Albumin 3.3 (L) 3.5 - 5.0 g/dL   AST 21 15 - 41 U/L   ALT 15 14 - 54 U/L   Alkaline Phosphatase 74 38 - 126 U/L   Total Bilirubin 0.5 0.3 - 1.2 mg/dL   GFR calc non Af Amer >60 >60 mL/min   GFR calc Af Amer >60 >60 mL/min   Anion gap 11 5 - 15  Urinalysis, Routine w reflex microscopic     Status: Abnormal   Collection Time: 07/19/16  1:50 PM  Result Value Ref Range   Color, Urine AMBER (A) YELLOW   APPearance CLOUDY (A) CLEAR   Specific Gravity, Urine 1.024 1.005 - 1.030   pH 7.0 5.0 - 8.0   Glucose, UA NEGATIVE NEGATIVE mg/dL   Hgb urine dipstick NEGATIVE NEGATIVE   Bilirubin Urine NEGATIVE NEGATIVE   Ketones, ur 80 (A) NEGATIVE mg/dL   Protein, ur NEGATIVE NEGATIVE mg/dL   Nitrite NEGATIVE NEGATIVE   Leukocytes, UA SMALL (A) NEGATIVE   RBC / HPF 0-5 0 - 5 RBC/hpf   WBC, UA 6-30 0 - 5 WBC/hpf   Bacteria, UA RARE (A) NONE SEEN   Squamous Epithelial / LPF  6-30 (A) NONE SEEN   Mucous PRESENT     MAU Course  Procedures None   MDM FHR - 140 bpm with doppler UA, CBC and CMP today without gross abnormalities  IV D5LR with Phenergan given Patient requests Pepcid, 20 mg IV ordered EKG - sinus tachycardia  80 ketones on UA, second liter of IV fluids given Patient requests food. Advised that since no emesis here and only one episode earlier today she could be discharge to eat at home.  Discussed the diet for N/V in pregnancy and included on AVS.  Patient insists that she needs to trial food here and refuses discharge until she can order something to eat.  Patient requested pot roast, broccoli, rice, rolls, tea and a brownie. RN advised not to order tea or brownie because they were likely to cause nausea. Patient ate everything else that she ordered and no emesis in MAU. She was able to walk out without difficulty or signs of weakness.  Assessment and Plan  A: SIUP at [redacted]w[redacted]d Nausea and vomiting in pregnancy prior to [redacted] weeks gestation GERD  P: Discharge home Diet for N/V in pregnancy and second trimester precautions discussed Patient advised to follow-up with [facility] as scheduled for routine OB care Patient may return to MAU as needed or if her condition were to change or worsen  Luvenia Redden, PA-C  07/20/2016, 8:45 AM

## 2016-07-19 NOTE — Progress Notes (Signed)
Pt ambulated around her room without any weakness noted.  Getting dressed for discharge now.

## 2016-07-20 ENCOUNTER — Encounter (HOSPITAL_COMMUNITY): Payer: Self-pay | Admitting: *Deleted

## 2016-07-20 ENCOUNTER — Inpatient Hospital Stay (HOSPITAL_COMMUNITY)
Admission: AD | Admit: 2016-07-20 | Discharge: 2016-07-20 | Disposition: A | Discharge status: Home / Self Care | Payer: Medicaid Other | Source: Ambulatory Visit | Attending: Family Medicine | Admitting: Family Medicine

## 2016-07-20 DIAGNOSIS — Z87891 Personal history of nicotine dependence: Secondary | ICD-10-CM | POA: Insufficient documentation

## 2016-07-20 DIAGNOSIS — O26892 Other specified pregnancy related conditions, second trimester: Secondary | ICD-10-CM | POA: Diagnosis present

## 2016-07-20 DIAGNOSIS — O219 Vomiting of pregnancy, unspecified: Secondary | ICD-10-CM

## 2016-07-20 DIAGNOSIS — Z3481 Encounter for supervision of other normal pregnancy, first trimester: Secondary | ICD-10-CM

## 2016-07-20 DIAGNOSIS — Z3A17 17 weeks gestation of pregnancy: Secondary | ICD-10-CM | POA: Insufficient documentation

## 2016-07-20 DIAGNOSIS — O210 Mild hyperemesis gravidarum: Secondary | ICD-10-CM | POA: Diagnosis not present

## 2016-07-20 LAB — URINALYSIS, ROUTINE W REFLEX MICROSCOPIC
Bilirubin Urine: NEGATIVE
Glucose, UA: NEGATIVE mg/dL
Hgb urine dipstick: NEGATIVE
KETONES UR: NEGATIVE mg/dL
Nitrite: NEGATIVE
PROTEIN: NEGATIVE mg/dL
Specific Gravity, Urine: 1.018 (ref 1.005–1.030)
pH: 8 (ref 5.0–8.0)

## 2016-07-20 MED ORDER — PANTOPRAZOLE SODIUM 40 MG PO TBEC
40.0000 mg | DELAYED_RELEASE_TABLET | Freq: Once | ORAL | Status: DC
  Filled 2016-07-20: qty 1

## 2016-07-20 MED ORDER — PANTOPRAZOLE SODIUM 40 MG PO TBEC: 40.0000 mg | DELAYED_RELEASE_TABLET | Freq: Every day | ORAL | 6 refills | Status: DC

## 2016-07-20 MED ORDER — PANTOPRAZOLE SODIUM 40 MG PO TBEC
40.0000 mg | DELAYED_RELEASE_TABLET | Freq: Every day | ORAL | Status: DC
  Administered 2016-07-20: 40 mg via ORAL
  Filled 2016-07-20: qty 1

## 2016-07-20 MED ORDER — PROCHLORPERAZINE MALEATE 5 MG PO TABS
5.0000 mg | ORAL_TABLET | Freq: Once | ORAL | Status: AC
  Administered 2016-07-20: 5 mg via ORAL
  Filled 2016-07-20: qty 1

## 2016-07-20 MED ORDER — ONDANSETRON 8 MG PO TBDP: 8.0000 mg | ORAL_TABLET | Freq: Three times a day (TID) | ORAL | 0 refills | Status: DC | PRN

## 2016-07-20 MED ORDER — ONDANSETRON 8 MG PO TBDP
8.0000 mg | ORAL_TABLET | Freq: Once | ORAL | Status: AC
  Administered 2016-07-20: 8 mg via ORAL
  Filled 2016-07-20: qty 1

## 2016-07-20 MED ORDER — FAMOTIDINE 20 MG PO TABS: 40.0000 mg | ORAL_TABLET | Freq: Two times a day (BID) | ORAL | 2 refills | Status: DC

## 2016-07-20 NOTE — MAU Provider Note (Signed)
History     CSN: [id number]  Arrival date and time: 07/20/16 [id number]   First Provider Initiated Contact with Patient 07/20/16 [id number]      Chief Complaint  Patient presents with  . Emesis During Pregnancy  . Fatigue  . Headache   HPI  Patient is a 31 year old G2P1001 at [redacted]w[redacted]d who is returning is a maternity admissions unit for emesis and weakness.  Patient was just seen yesterday in MAU complaining of the same problems, had received 2 L of IV fluids, IV Phenergan, was able to tolerate a diet consisting of part roast, broccoli, peanut butter and crackers, and juice. Patient was able to walk out on her own when she left. After she left the MAU, she went home and about 1-1/2 hours after arriving home, she had another episode of emesis. She states she threw up her entire meal. She states she has not taken any other medications after arriving home. One more episode of emesis. She called the on-call physician who instructed her to return to MAU. She denies any further syncope or presyncopal feelings. She is complaining of a sore throat from vomiting. Denies any fevers or chills, chest pain or shortness of breath. She was able to walk into the room today on her own without assistance. Patient claims she has lost 4 pounds this week. However per records she has actually gained weight throughout the entire pregnancy, first weight at 8 weeks was 147 lbs, today is 157 lbs, last week was 156 lbs. Patient is not complaining of excess saliva or spitting. Denies any diarrhea. She states Zofran and phenergan and pepcid do not work and makes her feel jittery, although she has not been taking these at home.   According to the patient she was told that she is supposed to be eating bacon, hamburgers, eggs and nuts in order to increase her protein in her diet. Patient is confused what she is supposed to eat, she was given a diet before she was discharged last night. She is requesting if she can eat brownies. Or what other  sweets she is allowed to eat. Patient also states that she was told she cannot have Prilosec in pregnancy. When asked who has given her these recommendations, she said she does not know.    OB History    Gravida Para Term Preterm AB Living   2 1 1     1    SAB TAB Ectopic Multiple Live Births           1      Past Medical History:  Diagnosis Date  . Abnormal uterine bleeding (AUB) 12/12/2015  . Anemia   . Anxiety   . Constipation   . Depression   . Fibroid   . Genital herpes   . GERD (gastroesophageal reflux disease)   . History of chlamydia   . PTSD (post-traumatic stress disorder)   . Sinusitis   . Vaginal dryness 12/12/2015    Past Surgical History:  Procedure Laterality Date  . COLONOSCOPY N/A 04/08/2016   Procedure: COLONOSCOPY;  Surgeon: Danie Binder, MD;  Location: [facility];  Service: Endoscopy;  Laterality: N/A;  10:15 AM  . ESOPHAGOGASTRODUODENOSCOPY N/A 04/08/2016   Procedure: ESOPHAGOGASTRODUODENOSCOPY (EGD);  Surgeon: Danie Binder, MD;  Location: [facility];  Service: Endoscopy;  Laterality: N/A;    Family History  Problem Relation Age of Onset  . Heart disease Mother   . Hypertension Mother   . Hypertension Father   . Hyperlipidemia  Father   . Diabetes Father   . Epilepsy Brother   . Eczema Daughter   . Colon cancer Neg Hx     Social History  Substance Use Topics  . Smoking status: Former Smoker    Packs/day: 0.50    Years: 5.00    Types: Cigarettes    Quit date: 06/03/2010  . Smokeless tobacco: Never Used  . Alcohol use No     Comment: 03/19/16 currently no ETOH due to meds; Previously: Wine three times a week    Allergies:  Allergies  Allergen Reactions  . Bee Venom Other (See Comments)    Reaction:  Localized swelling   . Penicillins Itching and Other (See Comments)    Has patient had a PCN reaction causing immediate rash, facial/tongue/throat swelling, SOB or lightheadedness with hypotension: No Has patient had a PCN reaction  causing severe rash involving mucus membranes or skin necrosis: No Has patient had a PCN reaction that required hospitalization No Has patient had a PCN reaction occurring within the last 10 years: Yes If all of the above answers are "NO", then may proceed with Cephalosporin use.     Prescriptions Prior to Admission  Medication Sig Dispense Refill Last Dose  . acetaminophen (TYLENOL) 500 MG tablet Take 500 mg by mouth every 6 (six) hours as needed for mild pain or headache.   07/18/2016 at Unknown time  . albuterol (PROVENTIL HFA;VENTOLIN HFA) 108 (90 Base) MCG/ACT inhaler Inhale 2 puffs into the lungs every 6 (six) hours as needed for wheezing or shortness of breath. 1 Inhaler 0 07/18/2016 at Unknown time  . buPROPion (WELLBUTRIN) 100 MG tablet Take 1 tablet (100 mg total) by mouth 2 (two) times daily. (Patient not taking: Reported on 07/19/2016) 60 tablet 6 Not Taking at Unknown time  . doxylamine, Sleep, (UNISOM) 25 MG tablet Take 25 mg by mouth at bedtime as needed.   Past Month at Unknown time  . famotidine (PEPCID) 20 MG tablet Take 20 mg by mouth 2 (two) times daily.   07/19/2016 at Unknown time  . metoCLOPramide (REGLAN) 10 MG tablet Take 1 tablet (10 mg total) by mouth every 6 (six) hours. 30 tablet 0   . multivitamin (VIT W/EXTRA C) CHEW chewable tablet Chew 1 tablet by mouth at bedtime as needed and may repeat dose one time if needed.    07/18/2016 at Unknown time  . ondansetron (ZOFRAN) 8 MG tablet Take 1 tablet (8 mg total) by mouth every 8 (eight) hours as needed for nausea or vomiting. 20 tablet 0 07/19/2016 at Unknown time  . pantoprazole (PROTONIX) 40 MG tablet Take 1 tablet (40 mg total) by mouth daily. (Patient not taking: Reported on 07/03/2016) 30 tablet 6 Not Taking  . potassium chloride (K-DUR) 10 MEQ tablet Take 4 tablets (40 mEq total) by mouth 2 (two) times daily. 24 tablet 0 07/18/2016 at Unknown time  . promethazine (PHENERGAN) 6.25 MG/5ML syrup TAKE 10 MLS BY MOUTH EVERY 4  HOURS AS NEEDED FOR NAUSEA AND VOMITING 240 mL 11 07/19/2016 at Unknown time  . pyridOXINE (VITAMIN B-6) 100 MG tablet Take 100 mg by mouth 2 (two) times daily.   07/19/2016 at Unknown time    Review of Systems Physical Exam   Weight 157 lb 4 oz (71.3 kg), last menstrual period 03/23/2016.   Physical Exam Constitutional: She is oriented to person, place, and time. She appears well-developed and well-nourished.  She does not appear in any distress or ill.  Cardiovascular: Normal  rate, regular rhythm and normal heart sounds, no murmurs. Pulmonary/Chest: Effort normal and breath sounds normal. CTAB. Abdominal: Soft. Non-tender. Non-distended. Normal BS x4. Neurological: She is alert and oriented to person, place, and time. She has normal reflexes.  Skin: Skin is warm and dry.  Musculoskeletal: No edema. Steady gait, walked in without assistance.    MAU Course  Procedures Orthostatics - normal UA - well hydrated without ketones No weight loss Compazine Protonix PO trial  8:30 AM - Handing off patient to Kerry Hough, PA, who will resume care for patient.  MDM FHR - 150 bpm with doppler Patient had small amount of emesis after PO trial ODT Zofran ordered Patient was asleep when RN entered room for re-evaluation. No nausea at this time. No further emesis. Patient able to tolerate PO in MAU.  Discussed diet at length with patient to avoid increased GERD symptoms and N/V. Discussed expectations for N/V in pregnancy.  Assessment and Plan  A: SIUP at [redacted]w[redacted]d Nausea and vomiting in pregnancy prior to [redacted] weeks gestation   P: Discharge home Rx for Protonix, Pepcid and Zofran sent to patient's pharmacy Diet for N/V in pregnancy discussed again and included on AVS Patient advised to follow-up with [facility] as scheduled for routine prenatal care or sooner PRN Patient may return to MAU as needed or if her condition were to change or worsen  Luvenia Redden, PA-C 07/20/2016 10:45 AM

## 2016-07-20 NOTE — Discharge Instructions (Signed)
Eating Plan for Hyperemesis Gravidarum °Hyperemesis gravidarum is a severe form of morning sickness. Because this condition causes severe nausea and vomiting, it can lead to dehydration, malnutrition, and weight loss. One way to lessen the symptoms of nausea and vomiting is to follow the eating plan for hyperemesis gravidarum. It is often used along with prescribed medicines to control your symptoms. °What can I do to relieve my symptoms? °Listen to your body. Everyone is different and has different preferences. Find what works best for you. Take any of the following actions that are helpful to you: °· Eat and drink slowly. °· Eat 5-6 small meals daily instead of 3 large meals. °· Eat crackers before you get out of bed in the morning. °· Try having a snack in the middle of the night. °· Starchy foods are usually tolerated well. Examples include cereal, toast, bread, potatoes, pasta, rice, and pretzels. °· Ginger may help with nausea. Add ¼ tsp ground ginger to hot tea or choose ginger tea. °· Try drinking 100% fruit juice or an electrolyte drink. An electrolyte drink contains sodium, potassium, and chloride. °· Continue to take your prenatal vitamins as told by your health care provider. If you are having trouble taking your prenatal vitamins, talk with your health care provider about different options. °· Include at least 1 serving of protein with your meals and snacks. Protein options include meats or poultry, beans, nuts, eggs, and yogurt. Try eating a protein-rich snack before bed. Examples of these snacks include cheese and crackers or half of a peanut butter or turkey sandwich. °· Consider eliminating foods that trigger your symptoms. These may include spicy foods, coffee, high-fat foods, very sweet foods, and acidic foods. °· Try meals that have more protein combined with bland, salty, lower-fat, and dry foods, such as nuts, seeds, pretzels, crackers, and cereal. °· Talk with your healthcare provider about  starting a supplement of vitamin B6. °· Have fluids that are cold, clear, and carbonated or sour. Examples include lemonade, ginger ale, lemon-lime soda, ice water, and sparkling water. °· Try lemon or mint tea. °· Try brushing your teeth or using a mouth rinse after meals. °What should I avoid to reduce my symptoms? °Avoiding some of the following things may help reduce your symptoms. °· Foods with strong smells. Try eating meals in well-ventilated areas that are free of odors. °· Drinking water or other beverages with meals. Try not to drink anything during the 30 minutes before and after your meals. °· Drinking more than 1 cup of fluid at a time. Sometimes using a straw helps. °· Fried or high-fat foods, such as butter and cream sauces. °· Spicy foods. °· Skipping meals as best as you can. Nausea can be more intense on an empty stomach. If you cannot tolerate food at that time, do not force it. Try sucking on ice chips or other frozen items, and make up for missed calories later. °· Lying down within 2 hours after eating. °· Environmental triggers. These may include smoky rooms, closed spaces, rooms with strong smells, warm or humid places, overly loud and noisy rooms, and rooms with motion or flickering lights. °· Quick and sudden changes in your movement. °This information is not intended to replace advice given to you by your health care provider. Make sure you discuss any questions you have with your health care provider. °Document Released: 03/17/2007 Document Revised: 01/17/2016 Document Reviewed: 12/19/2015 °Elsevier Interactive Patient Education © 2017 [employer] ° °

## 2016-07-20 NOTE — MAU Note (Addendum)
C/o ongoing vomiting.  Feels weak. (gait steady, did not require assistance). Has a headache.  Reports 4# wt loss this wk

## 2016-07-20 NOTE — MAU Note (Signed)
Pt left from here yesterday and started vomiting again when she went home.  She vomited twice since yesterday.  She called the nurse line this morning and was advised to come back in.

## 2016-07-22 ENCOUNTER — Ambulatory Visit: Payer: Medicaid Other | Admitting: Nurse Practitioner

## 2016-07-22 ENCOUNTER — Ambulatory Visit (INDEPENDENT_AMBULATORY_CARE_PROVIDER_SITE_OTHER): Payer: Medicaid Other | Admitting: Obstetrics and Gynecology

## 2016-07-22 ENCOUNTER — Encounter: Payer: Self-pay | Admitting: Obstetrics and Gynecology

## 2016-07-22 VITALS — BP 90/56 | HR 104 | Wt 156.0 lb

## 2016-07-22 DIAGNOSIS — O9989 Other specified diseases and conditions complicating pregnancy, childbirth and the puerperium: Secondary | ICD-10-CM | POA: Diagnosis not present

## 2016-07-22 DIAGNOSIS — O99511 Diseases of the respiratory system complicating pregnancy, first trimester: Secondary | ICD-10-CM

## 2016-07-22 DIAGNOSIS — Z3A17 17 weeks gestation of pregnancy: Secondary | ICD-10-CM

## 2016-07-22 DIAGNOSIS — Z1389 Encounter for screening for other disorder: Secondary | ICD-10-CM | POA: Diagnosis not present

## 2016-07-22 DIAGNOSIS — O210 Mild hyperemesis gravidarum: Secondary | ICD-10-CM

## 2016-07-22 DIAGNOSIS — Z331 Pregnant state, incidental: Secondary | ICD-10-CM

## 2016-07-22 LAB — POCT URINALYSIS DIPSTICK
Blood, UA: NEGATIVE
Glucose, UA: NEGATIVE
KETONES UA: NEGATIVE
LEUKOCYTES UA: NEGATIVE
Nitrite, UA: NEGATIVE
Protein, UA: NEGATIVE

## 2016-07-22 NOTE — Progress Notes (Signed)
Patient ID: Charlotte Mccormick, female   DOB: Jul 14, 1985, 31 y.o.   MRN: [id number]  W/I OB for N/V  G2P1001  Estimated Date of Delivery: 12/28/16 [facility] [redacted]w[redacted]d  Chief Complaint  Patient presents with  . Emesis  ____  Patient presents with a laundry list of complaints: Pt was seen at MAU 3 days ago for weakness and N/V/D. Pt states that since d/c, she has been able to tolerate chicken and vegetables. She also reports tolerating crackers and oatmeal. Pt states she has been drinking Powerade without issue and urinating 3-4x/day. Per pt, compazine tablets in the hospital helped with her nausea. She is not assuming responsibility for self care, seems reluctant to accept that this pregnancy is more difficult than last pregnancy, and will require active self- management of her complaints.  She reports she has called supervisor of MAU x 2 to leave messages that she wants to complain that she was allowed to go home after last eval instead of admission. I cannot find a lapse in care, but rather patient expectations unrealistic. She has NOT lost weight in last 3 wks, and is up 6 pounds total.  Discussion: 1. Extensive discussion of bland food diet, patients concerns and antiemetic options. All of patients concerns and questions were addressed.   At end of discussion, pt had opportunity to ask questions and has no further questions at this time.   Specific discussion of treatment plan as noted above. Greater than 50% was spent in counseling and coordination of care with the patient.   Total time greater than: 25 minutes.    Patient reports  Too early for fetal movement. She denies any bleeding, rupture of membranes,or regular contractions.  Blood pressure (!) 90/56, pulse (!) 104, weight 156 lb (70.8 kg), last menstrual period 03/23/2016.   Urine results:notable for none refer to the ob flow sheet for FH and FHR,                           Physical Examination: General appearance - alert, well appearing, and  in no distress                                      Abdomen - FH 18 cm,                                                         -FHR 146 bpm                                                         soft, nontender, nondistended, no masses or organomegaly                                            Questions were answered. Assessment: LROB G2P1001 @ [redacted]w[redacted]d  Hyperemesis, unmotivated pt ? Depression   Plan:  Continued routine obstetrical care Pt to take phenergan suppositories   F/u  in 1 week for routine prenatal care, recheck diet, and self management . Advised to work together with partner, who is present and supportive.  By signing my name below, I, Hansel Feinstein, attest that this documentation has been prepared under the direction and in the presence of [name], MD. Electronically Signed: Hansel Feinstein, ED Scribe. 07/22/16. 10:28 AM.  I personally performed the services described in this documentation, which was SCRIBED in my presence. The recorded information has been reviewed and considered accurate. It has been edited as necessary during review. [name], MD

## 2016-07-23 ENCOUNTER — Encounter: Payer: Self-pay | Admitting: Student

## 2016-07-23 DIAGNOSIS — E876 Hypokalemia: Secondary | ICD-10-CM | POA: Insufficient documentation

## 2016-07-24 ENCOUNTER — Encounter: Payer: Medicaid Other | Admitting: Obstetrics and Gynecology

## 2016-07-25 ENCOUNTER — Telehealth: Payer: Self-pay | Admitting: *Deleted

## 2016-07-25 NOTE — Telephone Encounter (Signed)
pLEASE COMPLETE WORK LEAVE FOR PT. SHE'S NOT DEALING WITH NAUSEA OF PREGNANCY VERY WELL.

## 2016-07-31 ENCOUNTER — Ambulatory Visit (INDEPENDENT_AMBULATORY_CARE_PROVIDER_SITE_OTHER): Payer: Medicaid Other | Admitting: Obstetrics and Gynecology

## 2016-07-31 ENCOUNTER — Encounter: Payer: Self-pay | Admitting: Obstetrics and Gynecology

## 2016-07-31 VITALS — BP 104/64 | HR 108 | Wt 160.0 lb

## 2016-07-31 DIAGNOSIS — O99342 Other mental disorders complicating pregnancy, second trimester: Secondary | ICD-10-CM | POA: Diagnosis not present

## 2016-07-31 DIAGNOSIS — Z3A19 19 weeks gestation of pregnancy: Secondary | ICD-10-CM

## 2016-07-31 DIAGNOSIS — O9989 Other specified diseases and conditions complicating pregnancy, childbirth and the puerperium: Secondary | ICD-10-CM | POA: Diagnosis not present

## 2016-07-31 DIAGNOSIS — Z331 Pregnant state, incidental: Secondary | ICD-10-CM

## 2016-07-31 DIAGNOSIS — Z1389 Encounter for screening for other disorder: Secondary | ICD-10-CM | POA: Diagnosis not present

## 2016-07-31 DIAGNOSIS — Z3682 Encounter for antenatal screening for nuchal translucency: Secondary | ICD-10-CM

## 2016-07-31 LAB — POCT URINALYSIS DIPSTICK
Blood, UA: NEGATIVE
Glucose, UA: NEGATIVE
KETONES UA: NEGATIVE
Nitrite, UA: NEGATIVE
PROTEIN UA: NEGATIVE

## 2016-07-31 NOTE — Progress Notes (Signed)
Charlotte Mccormick is a 31 y.o. female  G47P1001  Estimated Date of Delivery: 12/28/16 [facility] [redacted]w[redacted]d  Chief Complaint  Patient presents with  . Routine Prenatal Visit    2nd IT  ____  Patient complaints: continued anxiety. She states her Wellbutrin has not provided any relief. This is a chronic issue for her, states "this is just how I am". Pt has a h/o anxiety x 15 years. Pt has multiple stressors but notes she is very concerned about losing her job. She also notes she was on Prazosin which helped her with nightmares but states she cannot taken while pregnant.  She has no other acute physical complaints at this time. Patient denies any bleeding, rupture of membranes,or regular contractions.  Last menstrual period 03/23/2016.   Urine results:notable for trace leukocytes  refer to the ob flow sheet for FH and FHR, ,                          Physical Examination: General appearance - alert, well appearing, and in no distress                                      Abdomen - FH 18 cm ,                                                         -FHR 143 bpm                                                         soft, nontender, nondistended, no masses or organomegaly                                      Pelvic - examination not indicated                                            Discussion: 1. Extensive discussion of patient's anxiety and stressors. Addressed various ways to cope. All of patients concerns and questions were addressed.   At end of discussion, pt had opportunity to ask questions and has no further questions at this time.   Specific discussion of treatment plan as noted above. Greater than 50% was spent in counseling and coordination of care with the patient.   Total time greater than: 25 minutes.    Assessment: LROB G2P1001 @ [redacted]w[redacted]d   Plan:  Continued routine obstetrical care  F/u in 2 weeks for [place]  By signing my name below, I, Evelene Croon, attest that this documentation has  been prepared under the direction and in the presence of [name], MD . Electronically Signed: Evelene Croon, Scribe. 07/31/2016. 10:40 AM. I personally performed the services described in this documentation, which was SCRIBED in my presence. The recorded information has been reviewed and considered accurate. It has been edited  as necessary during review. [name], MD

## 2016-08-01 ENCOUNTER — Other Ambulatory Visit: Payer: Self-pay | Admitting: Student

## 2016-08-01 ENCOUNTER — Other Ambulatory Visit: Payer: Self-pay | Admitting: Medical

## 2016-08-02 ENCOUNTER — Telehealth: Payer: Self-pay | Admitting: *Deleted

## 2016-08-02 ENCOUNTER — Other Ambulatory Visit: Payer: Self-pay | Admitting: Medical

## 2016-08-02 ENCOUNTER — Encounter: Payer: Self-pay | Admitting: *Deleted

## 2016-08-02 ENCOUNTER — Other Ambulatory Visit: Payer: Self-pay | Admitting: *Deleted

## 2016-08-02 ENCOUNTER — Other Ambulatory Visit: Payer: Self-pay | Admitting: Student

## 2016-08-02 MED ORDER — PROMETHAZINE HCL 25 MG PO TABS: 12.5000 mg | ORAL_TABLET | Freq: Four times a day (QID) | ORAL | 0 refills | Status: DC | PRN

## 2016-08-02 MED ORDER — ONDANSETRON 8 MG PO TBDP: 8.0000 mg | ORAL_TABLET | Freq: Three times a day (TID) | ORAL | 0 refills | Status: DC | PRN

## 2016-08-02 NOTE — Telephone Encounter (Signed)
Patient would like refill on Zofran disintegrating tablets and her Phenergan tablets.

## 2016-08-05 ENCOUNTER — Telehealth: Payer: Self-pay | Admitting: Gastroenterology

## 2016-08-05 NOTE — Telephone Encounter (Signed)
There are long term disability papers for this patient clipped to a pink folder and I've laid it in [facility] office chair.

## 2016-08-05 NOTE — Telephone Encounter (Signed)
PLEASE CALL PT. She does not have go reason to receive long term disability. If she has questions she should make an appt to discuss why she need disability/

## 2016-08-05 NOTE — Telephone Encounter (Signed)
LMOM for patient that SF recommended an OV first to discuss need of disability papers

## 2016-08-07 LAB — MATERNAL SCREEN, INTEGRATED #2
AFP MOM: 1.1
Alpha-Fetoprotein: 63.7 ng/mL
CROWN RUMP LENGTH: 79 mm
DIA MoM: 1.25
DIA Value: 253.5 pg/mL
Estriol, Unconjugated: 1.74 ng/mL
GESTATIONAL AGE: 19.9 wk
Gest. Age on Collection Date: 13.3 weeks
HCG VALUE: 62 [IU]/mL
MATERNAL AGE AT EDD: 30.9 a
Nuchal Translucency (NT): 1.7 mm
Nuchal Translucency MoM: 0.88
Number of Fetuses: 1
PAPP-A MOM: 1.97
PAPP-A Value: 2525.7 ng/mL
TEST RESULTS: NEGATIVE
WEIGHT: 148 [lb_av]
WEIGHT: 148 [lb_av]
hCG MoM: 2.86
uE3 MoM: 0.93

## 2016-08-08 ENCOUNTER — Other Ambulatory Visit: Payer: Self-pay | Admitting: Women's Health

## 2016-08-08 ENCOUNTER — Inpatient Hospital Stay (HOSPITAL_COMMUNITY)
Admission: AD | Admit: 2016-08-08 | Discharge: 2016-08-08 | Disposition: A | Discharge status: Home / Self Care | Payer: Medicaid Other | Source: Ambulatory Visit | Attending: Obstetrics and Gynecology | Admitting: Obstetrics and Gynecology

## 2016-08-08 ENCOUNTER — Encounter (HOSPITAL_COMMUNITY): Payer: Self-pay | Admitting: *Deleted

## 2016-08-08 ENCOUNTER — Other Ambulatory Visit: Payer: Self-pay | Admitting: Obstetrics & Gynecology

## 2016-08-08 DIAGNOSIS — Z87891 Personal history of nicotine dependence: Secondary | ICD-10-CM | POA: Insufficient documentation

## 2016-08-08 DIAGNOSIS — Z3A19 19 weeks gestation of pregnancy: Secondary | ICD-10-CM | POA: Diagnosis not present

## 2016-08-08 DIAGNOSIS — O210 Mild hyperemesis gravidarum: Secondary | ICD-10-CM | POA: Insufficient documentation

## 2016-08-08 DIAGNOSIS — R7989 Other specified abnormal findings of blood chemistry: Secondary | ICD-10-CM

## 2016-08-08 DIAGNOSIS — Z880 Allergy status to penicillin: Secondary | ICD-10-CM | POA: Insufficient documentation

## 2016-08-08 DIAGNOSIS — O99342 Other mental disorders complicating pregnancy, second trimester: Secondary | ICD-10-CM | POA: Insufficient documentation

## 2016-08-08 DIAGNOSIS — O26892 Other specified pregnancy related conditions, second trimester: Secondary | ICD-10-CM | POA: Diagnosis present

## 2016-08-08 DIAGNOSIS — R112 Nausea with vomiting, unspecified: Secondary | ICD-10-CM | POA: Diagnosis not present

## 2016-08-08 DIAGNOSIS — R5381 Other malaise: Secondary | ICD-10-CM | POA: Diagnosis not present

## 2016-08-08 DIAGNOSIS — D219 Benign neoplasm of connective and other soft tissue, unspecified: Secondary | ICD-10-CM | POA: Diagnosis present

## 2016-08-08 DIAGNOSIS — R748 Abnormal levels of other serum enzymes: Secondary | ICD-10-CM

## 2016-08-08 DIAGNOSIS — F410 Panic disorder [episodic paroxysmal anxiety] without agoraphobia: Secondary | ICD-10-CM | POA: Diagnosis not present

## 2016-08-08 DIAGNOSIS — O3680X0 Pregnancy with inconclusive fetal viability, not applicable or unspecified: Secondary | ICD-10-CM

## 2016-08-08 HISTORY — DX: Unspecified asthma, uncomplicated: J45.909

## 2016-08-08 LAB — CBC
HEMATOCRIT: 37.8 % (ref 36.0–46.0)
Hemoglobin: 11.9 g/dL — ABNORMAL LOW (ref 12.0–15.0)
MCH: 21.8 pg — AB (ref 26.0–34.0)
MCHC: 31.5 g/dL (ref 30.0–36.0)
MCV: 69.1 fL — ABNORMAL LOW (ref 78.0–100.0)
PLATELETS: 201 10*3/uL (ref 150–400)
RBC: 5.47 MIL/uL — ABNORMAL HIGH (ref 3.87–5.11)
RDW: 15.9 % — AB (ref 11.5–15.5)
WBC: 5.3 10*3/uL (ref 4.0–10.5)

## 2016-08-08 LAB — COMPREHENSIVE METABOLIC PANEL
ALK PHOS: 70 U/L (ref 38–126)
ALT: 16 U/L (ref 14–54)
ANION GAP: 11 (ref 5–15)
AST: 23 U/L (ref 15–41)
Albumin: 3.9 g/dL (ref 3.5–5.0)
BILIRUBIN TOTAL: 0.3 mg/dL (ref 0.3–1.2)
BUN: <5 mg/dL — ABNORMAL LOW (ref 6–20)
CO2: 19 mmol/L — ABNORMAL LOW (ref 22–32)
Calcium: 9.1 mg/dL (ref 8.9–10.3)
Chloride: 105 mmol/L (ref 101–111)
Creatinine, Ser: 0.52 mg/dL (ref 0.44–1.00)
Glucose, Bld: 64 mg/dL — ABNORMAL LOW (ref 65–99)
Potassium: 3.4 mmol/L — ABNORMAL LOW (ref 3.5–5.1)
Sodium: 135 mmol/L (ref 135–145)
TOTAL PROTEIN: 8.7 g/dL — AB (ref 6.5–8.1)

## 2016-08-08 LAB — LIPASE, BLOOD: LIPASE: 23 U/L (ref 11–51)

## 2016-08-08 LAB — URINALYSIS, ROUTINE W REFLEX MICROSCOPIC
Bilirubin Urine: NEGATIVE
GLUCOSE, UA: NEGATIVE mg/dL
Hgb urine dipstick: NEGATIVE
Ketones, ur: NEGATIVE mg/dL
LEUKOCYTES UA: NEGATIVE
Nitrite: NEGATIVE
PROTEIN: NEGATIVE mg/dL
Specific Gravity, Urine: 1.008 (ref 1.005–1.030)
pH: 8 (ref 5.0–8.0)

## 2016-08-08 LAB — AMYLASE: Amylase: 203 U/L — ABNORMAL HIGH (ref 28–100)

## 2016-08-08 MED ORDER — ACETAMINOPHEN 325 MG PO TABS
650.0000 mg | ORAL_TABLET | Freq: Once | ORAL | Status: AC
  Administered 2016-08-08: 650 mg via ORAL
  Filled 2016-08-08: qty 2

## 2016-08-08 MED ORDER — ONDANSETRON 4 MG PO TBDP
4.0000 mg | ORAL_TABLET | Freq: Once | ORAL | Status: AC
  Administered 2016-08-08: 4 mg via ORAL
  Filled 2016-08-08: qty 1

## 2016-08-08 MED ORDER — GI COCKTAIL ~~LOC~~
30.0000 mL | Freq: Once | ORAL | Status: AC
  Administered 2016-08-08: 30 mL via ORAL
  Filled 2016-08-08: qty 30

## 2016-08-08 NOTE — MAU Provider Note (Signed)
Chief Complaint:  No chief complaint on file.   First Provider Initiated Contact with Patient 08/08/16 1148     HPI: Charlotte Mccormick is a 31 y.o. G2P1001 at 27w5dwho presents to maternity admissions reporting multiple complaints.  Tearful.  States has been vomiting nonstop for 2 days. Has used Phenergan and Zofran without relief.  States cannot keep anything down. States it makes her feel weak and she cannot walk or stand alone. When going to bathroom here, draped herself over RN for assistance.  States urinates every time she vomits.   Did urinate very large amount here on presentation.  Also c/o panic attack earlier today and states she has them frequently .  Used to be on Prazosin for sleep, but therapist stopped it. Has not talked to Dr Glo Herring about it.   Also c/o burning in throat due to vomiting and acid.  Was on protonix for 2 months and is now on Pepcid. States is not helping. States had pyloric stenosis and had a procedure to open up the valve.  Sees Dr fields for gastroenterology.  Also c/o  Lower abdominal cramping, about 5-6 times per day.  Denies bleeding. Worried it is due to her fibroid. States they saw her fibroid on 5 week [place]. Has anatomy US scheduled this week.  . She reports good fetal movement, denies LOF, vaginal bleeding, vaginal itching/burning, urinary symptoms, h/a, dizziness, n/v, diarrhea, constipation or fever/chills.  She denies headache, visual changes or RUQ abdominal pain.  Abdominal Pain  This is a recurrent problem. The current episode started 1 to 4 weeks ago. The onset quality is gradual. The problem occurs intermittently. The problem has been unchanged. The pain is located in the LLQ and RLQ. The pain is moderate. The quality of the pain is cramping. The abdominal pain does not radiate. Associated symptoms include nausea and vomiting. Pertinent negatives include no anorexia, constipation, diarrhea, dysuria, fever, frequency, headaches or myalgias. Nothing  aggravates the pain. The pain is relieved by nothing. She has tried H2 blockers and proton pump inhibitors for the symptoms. The treatment provided no relief.  Emesis   This is a recurrent problem. The current episode started 1 to 4 weeks ago. The problem occurs more than 10 times per day. The problem has been unchanged. There has been no fever. Associated symptoms include abdominal pain and dizziness. Pertinent negatives include no chest pain, diarrhea, fever, headaches, myalgias, sweats or URI. Treatments tried: Zofran and Phenergan.  Anxiety  Presents for follow-up visit. Symptoms include depressed mood, dizziness, hyperventilation, malaise, nausea and panic. Patient reports no chest pain, confusion, nervous/anxious behavior, shortness of breath or suicidal ideas. Symptoms occur occasionally. The severity of symptoms is causing significant distress. The quality of sleep is poor.      Past Medical History: Past Medical History:  Diagnosis Date  . Abnormal uterine bleeding (AUB) 12/12/2015  . Anemia   . Anxiety   . Asthma   . Constipation   . Depression   . Fibroid   . Fibroid   . Genital herpes   . GERD (gastroesophageal reflux disease)   . History of chlamydia   . PTSD (post-traumatic stress disorder)   . Sinusitis   . Vaginal dryness 12/12/2015    Past obstetric history: OB History  Gravida Para Term Preterm AB Living  2 1 1     1   SAB TAB Ectopic Multiple Live Births          1    # Outcome Date  GA Lbr Len/2nd Weight Sex Delivery Anes PTL Lv  2 Current           1 Term 04/02/12 [redacted]w[redacted]d  7 lb 2 oz (3.232 kg) F Vag-Spont EPI N LIV      Past Surgical History: Past Surgical History:  Procedure Laterality Date  . COLONOSCOPY N/A 04/08/2016   Procedure: COLONOSCOPY;  Surgeon: Danie Binder, MD;  Location: [facility];  Service: Endoscopy;  Laterality: N/A;  10:15 AM  . ESOPHAGOGASTRODUODENOSCOPY N/A 04/08/2016   Procedure: ESOPHAGOGASTRODUODENOSCOPY (EGD);  Surgeon: Danie Binder, MD;  Location: [facility];  Service: Endoscopy;  Laterality: N/A;    Family History: Family History  Problem Relation Age of Onset  . Heart disease Mother   . Hypertension Mother   . Hypertension Father   . Hyperlipidemia Father   . Diabetes Father   . Epilepsy Brother   . Eczema Daughter   . Colon cancer Neg Hx     Social History: Social History  Substance Use Topics  . Smoking status: Former Smoker    Packs/day: 0.50    Years: 5.00    Types: Cigarettes    Quit date: 06/03/2010  . Smokeless tobacco: Never Used  . Alcohol use No     Comment: 03/19/16 currently no ETOH due to meds; Previously: Wine three times a week    Allergies:  Allergies  Allergen Reactions  . Bee Venom Other (See Comments)    Reaction:  Localized swelling   . Penicillins Itching and Other (See Comments)    Has patient had a PCN reaction causing immediate rash, facial/tongue/throat swelling, SOB or lightheadedness with hypotension: No Has patient had a PCN reaction causing severe rash involving mucus membranes or skin necrosis: No Has patient had a PCN reaction that required hospitalization No Has patient had a PCN reaction occurring within the last 10 years: Yes If all of the above answers are "NO", then may proceed with Cephalosporin use.   . Reglan [Metoclopramide]     Meds:  Prescriptions Prior to Admission  Medication Sig Dispense Refill Last Dose  . acetaminophen (TYLENOL) 500 MG tablet Take 500 mg by mouth every 6 (six) hours as needed for mild pain or headache.   Past Week at Unknown time  . albuterol (PROVENTIL HFA;VENTOLIN HFA) 108 (90 Base) MCG/ACT inhaler Inhale 2 puffs into the lungs every 6 (six) hours as needed for wheezing or shortness of breath. 1 Inhaler 0 Past Week at Unknown time  . buPROPion (WELLBUTRIN) 100 MG tablet Take 100 mg by mouth daily.   08/07/2016 at Unknown time  . famotidine (PEPCID) 20 MG tablet TAKE 2 TABLETS(40 MG) BY MOUTH TWICE DAILY 360 tablet 2  08/08/2016 at Unknown time  . multivitamin (VIT W/EXTRA C) CHEW chewable tablet Chew 1 tablet by mouth at bedtime as needed and may repeat dose one time if needed.    Past Week at Unknown time  . ondansetron (ZOFRAN ODT) 8 MG disintegrating tablet Take 1 tablet (8 mg total) by mouth every 8 (eight) hours as needed for nausea or vomiting. 20 tablet 0 08/08/2016 at Unknown time  . pyridOXINE (VITAMIN B-6) 100 MG tablet Take 100 mg by mouth 2 (two) times daily.   08/08/2016 at Unknown time  . ondansetron (ZOFRAN) 8 MG tablet Take 1 tablet (8 mg total) by mouth every 8 (eight) hours as needed for nausea or vomiting. (Patient not taking: Reported on 07/22/2016) 20 tablet 0 Not Taking  . pantoprazole (PROTONIX)  40 MG tablet Take 1 tablet (40 mg total) by mouth daily. (Patient not taking: Reported on 07/22/2016) 30 tablet 6 Not Taking  . potassium chloride (K-DUR) 10 MEQ tablet Take 4 tablets (40 mEq total) by mouth 2 (two) times daily. 24 tablet 0 07/19/2016  . promethazine (PHENERGAN) 25 MG tablet Take 0.5-1 tablets (12.5-25 mg total) by mouth every 6 (six) hours as needed for nausea or vomiting. 30 tablet 0 08/06/2016  . promethazine (PHENERGAN) 6.25 MG/5ML syrup TAKE 10 MLS BY MOUTH EVERY 4 HOURS AS NEEDED FOR NAUSEA AND VOMITING (Patient not taking: Reported on 07/22/2016) 240 mL 11 Not Taking    I have reviewed patient's Past Medical Hx, Surgical Hx, Family Hx, Social Hx, medications and allergies.   ROS:  Review of Systems  Constitutional: Negative for fever.  Respiratory: Negative for shortness of breath.   Cardiovascular: Negative for chest pain.  Gastrointestinal: Positive for abdominal pain, nausea and vomiting. Negative for anorexia, constipation and diarrhea.  Genitourinary: Negative for dysuria and frequency.  Musculoskeletal: Negative for myalgias.  Neurological: Positive for dizziness. Negative for headaches.  Psychiatric/Behavioral: Negative for confusion and suicidal ideas. The patient is not  nervous/anxious.    Other systems negative  Physical Exam  Patient Vitals for the past 24 hrs:  BP Temp Pulse Resp SpO2  08/08/16 1135 98/57 98 F (36.7 C) 86 16 97 %   Constitutional: Well-developed female in no acute distress.  Affect is depressed, speaks in whispers. Keeps eyes closed.   Unable to walk to bathroom, but did well standing for orthostatic VS.    Cardiovascular: normal rate and rhythm Respiratory: normal effort, clear to auscultation bilaterally GI: Abd soft, non-tender, gravid appropriate for gestational age.   No rebound or guarding. MS: Extremities nontender, no edema, normal ROM Neurologic: Alert and oriented x 4.  GU: Neg CVAT.  PELVIC EXAM: Closed and long    FHT:   152  Labs: B/Positive/-- (12/20 1445) Results for orders placed or performed during the hospital encounter of 08/08/16 (from the past 24 hour(s))  Urinalysis, Routine w reflex microscopic     Status: None   Collection Time: 08/08/16 11:21 AM  Result Value Ref Range   Color, Urine YELLOW YELLOW   APPearance CLEAR CLEAR   Specific Gravity, Urine 1.008 1.005 - 1.030   pH 8.0 5.0 - 8.0   Glucose, UA NEGATIVE NEGATIVE mg/dL   Hgb urine dipstick NEGATIVE NEGATIVE   Bilirubin Urine NEGATIVE NEGATIVE   Ketones, ur NEGATIVE NEGATIVE mg/dL   Protein, ur NEGATIVE NEGATIVE mg/dL   Nitrite NEGATIVE NEGATIVE   Leukocytes, UA NEGATIVE NEGATIVE  CBC     Status: Abnormal   Collection Time: 08/08/16  1:07 PM  Result Value Ref Range   WBC 5.3 4.0 - 10.5 K/uL   RBC 5.47 (H) 3.87 - 5.11 MIL/uL   Hemoglobin 11.9 (L) 12.0 - 15.0 g/dL   HCT 37.8 36.0 - 46.0 %   MCV 69.1 (L) 78.0 - 100.0 fL   MCH 21.8 (L) 26.0 - 34.0 pg   MCHC 31.5 30.0 - 36.0 g/dL   RDW 15.9 (H) 11.5 - 15.5 %   Platelets 201 150 - 400 K/uL  Amylase     Status: Abnormal   Collection Time: 08/08/16  1:07 PM  Result Value Ref Range   Amylase 203 (H) 28 - 100 U/L  Comprehensive metabolic panel     Status: Abnormal   Collection Time:  08/08/16  1:07 PM  Result Value Ref Range  Sodium 135 135 - 145 mmol/L   Potassium 3.4 (L) 3.5 - 5.1 mmol/L   Chloride 105 101 - 111 mmol/L   CO2 19 (L) 22 - 32 mmol/L   Glucose, Bld 64 (L) 65 - 99 mg/dL   BUN <5 (L) 6 - 20 mg/dL   Creatinine, Ser 0.52 0.44 - 1.00 mg/dL   Calcium 9.1 8.9 - 10.3 mg/dL   Total Protein 8.7 (H) 6.5 - 8.1 g/dL   Albumin 3.9 3.5 - 5.0 g/dL   AST 23 15 - 41 U/L   ALT 16 14 - 54 U/L   Alkaline Phosphatase 70 38 - 126 U/L   Total Bilirubin 0.3 0.3 - 1.2 mg/dL   GFR calc non Af Amer >60 >60 mL/min   GFR calc Af Amer >60 >60 mL/min   Anion gap 11.0 5 - 15  Lipase, blood     Status: None   Collection Time: 08/08/16  1:07 PM  Result Value Ref Range   Lipase 23 11 - 51 U/L    Imaging:  No results found.  MAU Course/MDM: I have ordered labs and reviewed results. Urine is very dilute  Will not require IV fluids.  Will check cbc and cmet  >> no leukocytosis. LFTs normal, lipase normal but amylase is elevated.  Hemoglobin has increased since last check.   Will check orthostatic vital signs to evaluate for orthostatic changes. >> no orthostatic changes  Consult Dr Rip Harbour with presentation, exam findings and test results.  Treatments in MAU included PO fluids and crackers. Tolerated well. No vomiting  Discussed results with pt.  Discussed needs gallbladder US but cannot do here due to recent PO intake. Will schedule in [place] per pt request. Message sent.   Wants to know what she can take for reflux. Discussed Protonix is gold standard, so recommend restarting that. Has supply of phenergan and zofran   Has gastroenterologist. States they told her she had a "closed off pyloric something" and it was dilated. Recommend followup with her.  .    Assessment: [facility] at [redacted]w[redacted]d Nausea and vomiting Malaise No evidence of dehydration Elevated amylase, ? gallstones  Plan: Discharge home Take zofran for nausea Message sent to office for scheduling of  gallbladder US Diet as tolerated, discussed things she can eat.  Preterm Labor precautions Follow up in Office for prenatal visits and recheck of status  Encouraged to return here or to other Urgent Care/ED if she develops worsening of symptoms, increase in pain, fever, or other concerning symptoms.   Pt stable at time of discharge.  Hansel Feinstein CNM, MSN Certified Nurse-Midwife 08/08/2016 12:30 PM

## 2016-08-08 NOTE — MAU Note (Signed)
Urine in lab 

## 2016-08-08 NOTE — Discharge Instructions (Signed)
Eating Plan for Hyperemesis Gravidarum Hyperemesis gravidarum is a severe form of morning sickness. Because this condition causes severe nausea and vomiting, it can lead to dehydration, malnutrition, and weight loss. One way to lessen the symptoms of nausea and vomiting is to follow the eating plan for hyperemesis gravidarum. It is often used along with prescribed medicines to control your symptoms. What can I do to relieve my symptoms? Listen to your body. Everyone is different and has different preferences. Find what works best for you. Take any of the following actions that are helpful to you:  Eat and drink slowly.  Eat 5-6 small meals daily instead of 3 large meals.  Eat crackers before you get out of bed in the morning.  Try having a snack in the middle of the night.  Starchy foods are usually tolerated well. Examples include cereal, toast, bread, potatoes, pasta, rice, and pretzels.  Ginger may help with nausea. Add  tsp ground ginger to hot tea or choose ginger tea.  Try drinking 100% fruit juice or an electrolyte drink. An electrolyte drink contains sodium, potassium, and chloride.  Continue to take your prenatal vitamins as told by your health care provider. If you are having trouble taking your prenatal vitamins, talk with your health care provider about different options.  Include at least 1 serving of protein with your meals and snacks. Protein options include meats or poultry, beans, nuts, eggs, and yogurt. Try eating a protein-rich snack before bed. Examples of these snacks include cheese and crackers or half of a peanut butter or [place] sandwich.  Consider eliminating foods that trigger your symptoms. These may include spicy foods, coffee, high-fat foods, very sweet foods, and acidic foods.  Try meals that have more protein combined with bland, salty, lower-fat, and dry foods, such as nuts, seeds, pretzels, crackers, and cereal.  Talk with your healthcare provider about  starting a supplement of vitamin B6.  Have fluids that are cold, clear, and carbonated or sour. Examples include lemonade, ginger ale, lemon-lime soda, ice water, and sparkling water.  Try lemon or mint tea.  Try brushing your teeth or using a mouth rinse after meals. What should I avoid to reduce my symptoms? Avoiding some of the following things may help reduce your symptoms.  Foods with strong smells. Try eating meals in well-ventilated areas that are free of odors.  Drinking water or other beverages with meals. Try not to drink anything during the 30 minutes before and after your meals.  Drinking more than 1 cup of fluid at a time. Sometimes using a straw helps.  Fried or high-fat foods, such as butter and cream sauces.  Spicy foods.  Skipping meals as best as you can. Nausea can be more intense on an empty stomach. If you cannot tolerate food at that time, do not force it. Try sucking on ice chips or other frozen items, and make up for missed calories later.  Lying down within 2 hours after eating.  Environmental triggers. These may include smoky rooms, closed spaces, rooms with strong smells, warm or humid places, overly loud and noisy rooms, and rooms with motion or flickering lights.  Quick and sudden changes in your movement. This information is not intended to replace advice given to you by your health care provider. Make sure you discuss any questions you have with your health care provider. Document Released: 03/17/2007 Document Revised: 01/17/2016 Document Reviewed: 12/19/2015 Elsevier Interactive Patient Education  2017 [place].  Blood Amylase Test Why am  I having this test? The blood amylase test is used to check for pancreatitis and to monitor the progression of this condition. Your health care provider may perform this test if you have acute abdominal pain. This test measures amylase in your blood. The levels of this enzyme rise in your blood within the first  12 hours of the onset of acute diseases of the pancreas. Amylase levels are also raised in people who have persistent disorders of the pancreas. What kind of sample is taken? A blood sample is required for this test. It is usually collected by inserting a needle into a vein. How do I prepare for this test? There is no preparation or fasting required for this test. What are the reference ranges? Reference ranges are considered healthy ranges established after testing a large group of healthy people. Reference ranges may vary among different people, labs, and hospitals. It is your responsibility to obtain your test results. Ask the lab or department performing the test when and how you will get your results.  Adult: 60-120 [place] units/dL or 30-220 units/L (SI units).  Newborn: 6-65 units/L. Amylase levels remain low for the first 2 months of life. They increase to adult values by 31 year of age. Values may be slightly increased during normal pregnancy and in older adults. What do the results mean? Values greater than the reference ranges may indicate:  Acute or long-lasting (chronic) pancreatitis.  Gastrointestinal (GI) disease.  Kidney failure.  Diabetic ketoacidosis. Talk with your health care provider to discuss your results, treatment options, and if necessary, the need for more tests. Talk with your health care provider if you have any questions about your results. Talk with your health care provider to discuss your results, treatment options, and if necessary, the need for more tests. Talk with your health care provider if you have any questions about your results. This information is not intended to replace advice given to you by your health care provider. Make sure you discuss any questions you have with your health care provider. Document Released: 06/11/2004 Document Revised: 01/22/2016 Document Reviewed: 10/19/2013 Elsevier Interactive Patient Education  2017 [employer].

## 2016-08-08 NOTE — MAU Note (Signed)
Pt reports she has been unable to hold anything down for the last 2 days, has had hyperemesis throughout the pregnancy. Has meds at home but they are not helping. Denies pain.

## 2016-08-09 ENCOUNTER — Telehealth: Payer: Self-pay | Admitting: *Deleted

## 2016-08-09 NOTE — Telephone Encounter (Signed)
Spoke with pt advising to call central scheduling, gave #, and can have test at [facility]. Pt voiced understanding. [place]

## 2016-08-09 NOTE — Telephone Encounter (Signed)
-----   Message from Octaviano Glow, RN sent at 08/09/2016  9:14 AM EST ----- Please call patient and inform her to call central scheduling. [phone] she can have it done at [facility]. Order is in. Thanks

## 2016-08-11 ENCOUNTER — Other Ambulatory Visit: Payer: Self-pay | Admitting: Obstetrics and Gynecology

## 2016-08-12 ENCOUNTER — Telehealth: Payer: Self-pay | Admitting: *Deleted

## 2016-08-12 ENCOUNTER — Other Ambulatory Visit: Payer: Self-pay | Admitting: Advanced Practice Midwife

## 2016-08-12 NOTE — Telephone Encounter (Signed)
Patient needs to reschedule anatomy u/s d/t gallbladder u/s at [facility] on 3/15 at [place]. Will come 3/19 at 11:30.

## 2016-08-13 ENCOUNTER — Encounter: Payer: Self-pay | Admitting: Gastroenterology

## 2016-08-14 ENCOUNTER — Encounter: Payer: Self-pay | Admitting: Women's Health

## 2016-08-15 ENCOUNTER — Other Ambulatory Visit: Payer: Self-pay | Admitting: Obstetrics and Gynecology

## 2016-08-15 ENCOUNTER — Ambulatory Visit (HOSPITAL_COMMUNITY)
Admission: RE | Admit: 2016-08-15 | Discharge: 2016-08-15 | Disposition: A | Discharge status: Home / Self Care | Payer: Medicaid Other | Source: Ambulatory Visit | Attending: Obstetrics & Gynecology | Admitting: Obstetrics & Gynecology

## 2016-08-15 ENCOUNTER — Other Ambulatory Visit: Payer: Medicaid Other

## 2016-08-15 ENCOUNTER — Encounter: Payer: Medicaid Other | Admitting: Obstetrics and Gynecology

## 2016-08-15 ENCOUNTER — Other Ambulatory Visit: Payer: Self-pay | Admitting: Obstetrics & Gynecology

## 2016-08-15 DIAGNOSIS — R7989 Other specified abnormal findings of blood chemistry: Secondary | ICD-10-CM | POA: Insufficient documentation

## 2016-08-16 ENCOUNTER — Other Ambulatory Visit: Payer: Self-pay | Admitting: Obstetrics & Gynecology

## 2016-08-16 DIAGNOSIS — Z363 Encounter for antenatal screening for malformations: Secondary | ICD-10-CM

## 2016-08-19 ENCOUNTER — Encounter: Payer: Self-pay | Admitting: Obstetrics & Gynecology

## 2016-08-19 ENCOUNTER — Ambulatory Visit (INDEPENDENT_AMBULATORY_CARE_PROVIDER_SITE_OTHER): Payer: Medicaid Other | Admitting: Obstetrics & Gynecology

## 2016-08-19 ENCOUNTER — Other Ambulatory Visit: Payer: Self-pay | Admitting: Women's Health

## 2016-08-19 ENCOUNTER — Ambulatory Visit (INDEPENDENT_AMBULATORY_CARE_PROVIDER_SITE_OTHER): Payer: Medicaid Other

## 2016-08-19 VITALS — BP 110/72 | HR 92 | Wt 162.0 lb

## 2016-08-19 DIAGNOSIS — O09892 Supervision of other high risk pregnancies, second trimester: Secondary | ICD-10-CM

## 2016-08-19 DIAGNOSIS — Z363 Encounter for antenatal screening for malformations: Secondary | ICD-10-CM | POA: Diagnosis not present

## 2016-08-19 DIAGNOSIS — Z3A21 21 weeks gestation of pregnancy: Secondary | ICD-10-CM

## 2016-08-19 DIAGNOSIS — O9989 Other specified diseases and conditions complicating pregnancy, childbirth and the puerperium: Secondary | ICD-10-CM | POA: Diagnosis not present

## 2016-08-19 DIAGNOSIS — Z1389 Encounter for screening for other disorder: Secondary | ICD-10-CM

## 2016-08-19 DIAGNOSIS — Z3482 Encounter for supervision of other normal pregnancy, second trimester: Secondary | ICD-10-CM

## 2016-08-19 DIAGNOSIS — O210 Mild hyperemesis gravidarum: Secondary | ICD-10-CM

## 2016-08-19 DIAGNOSIS — Z8759 Personal history of other complications of pregnancy, childbirth and the puerperium: Secondary | ICD-10-CM

## 2016-08-19 DIAGNOSIS — Z331 Pregnant state, incidental: Secondary | ICD-10-CM | POA: Diagnosis not present

## 2016-08-19 LAB — POCT URINALYSIS DIPSTICK
Blood, UA: NEGATIVE
GLUCOSE UA: NEGATIVE
KETONES UA: NEGATIVE
Nitrite, UA: NEGATIVE
Protein, UA: NEGATIVE

## 2016-08-19 NOTE — Progress Notes (Signed)
G2P1001 [redacted]w[redacted]d Estimated Date of Delivery: 12/28/16  Blood pressure 110/72, pulse 92, weight 162 lb (73.5 kg), last menstrual period 03/23/2016.   BP weight and urine results all reviewed and noted.  Please refer to the obstetrical flow sheet for the fundal height and fetal heart rate documentation:  Patient reports good fetal movement, denies any bleeding and no rupture of membranes symptoms or regular contractions. Patient is without complaints. All questions were answered.  Orders Placed This Encounter  Procedures  . POCT urinalysis dipstick    Plan:  Continued routine obstetrical care, sonogram is normal today  Return in about 4 weeks (around 09/16/2016) for LROB.

## 2016-08-19 NOTE — Progress Notes (Signed)
[place] 11+0 wks,cephalic,ant pl gr 0,normal ov's bilat,cx 4.6 cm,svp of fluid 5.3 cm,fhr 144 bpm,efw 491 g,anatomy complete,no obvious abnormalities seen

## 2016-08-22 ENCOUNTER — Telehealth: Payer: Self-pay | Admitting: *Deleted

## 2016-08-22 ENCOUNTER — Other Ambulatory Visit: Payer: Self-pay | Admitting: Obstetrics & Gynecology

## 2016-08-22 MED ORDER — GI COCKTAIL ~~LOC~~: ORAL | 1 refills | Status: DC

## 2016-08-22 NOTE — Telephone Encounter (Signed)
Patient would like a scipt for GI cocktail. Please advise.

## 2016-08-25 ENCOUNTER — Other Ambulatory Visit: Payer: Self-pay | Admitting: Women's Health

## 2016-08-26 NOTE — Telephone Encounter (Signed)
Pt states she is taking the Zofran every 8 hours, still throwing up at least once a day and stays nauseated but not as bad as she had been.  Pt states she still has 3 - 4 pills left but wanted to get refill before she was completely out.

## 2016-09-03 ENCOUNTER — Encounter: Payer: Self-pay | Admitting: Women's Health

## 2016-09-04 ENCOUNTER — Telehealth: Payer: Self-pay | Admitting: Obstetrics & Gynecology

## 2016-09-04 NOTE — Telephone Encounter (Signed)
Called patient to inform that a "mental health evaluation" was sent over and advised to take that to her therapy appointment for them to fill out. Pt verbalized understanding. Stated she would email the FMLA forms for continuous leave.

## 2016-09-10 ENCOUNTER — Telehealth: Payer: Self-pay | Admitting: Gastroenterology

## 2016-09-10 NOTE — Telephone Encounter (Signed)
Pt called SAT EVENING. HAVING HEARTBURN. SLEEP ON A WEDGE. AVOID FOOD THAT CAUSE REFLUX. CONTINUE WITH PEPCID/PROTONIX. FOLLOW UP WITH OB.

## 2016-09-12 ENCOUNTER — Telehealth: Payer: Self-pay | Admitting: *Deleted

## 2016-09-13 ENCOUNTER — Other Ambulatory Visit: Payer: Self-pay | Admitting: Obstetrics & Gynecology

## 2016-09-13 MED ORDER — OMEPRAZOLE 20 MG PO CPDR: 20.0000 mg | DELAYED_RELEASE_CAPSULE | Freq: Every day | ORAL | 6 refills | Status: DC

## 2016-09-16 ENCOUNTER — Ambulatory Visit (INDEPENDENT_AMBULATORY_CARE_PROVIDER_SITE_OTHER): Payer: Medicaid Other | Admitting: Obstetrics & Gynecology

## 2016-09-16 ENCOUNTER — Encounter: Payer: Self-pay | Admitting: Obstetrics & Gynecology

## 2016-09-16 VITALS — BP 100/60 | HR 84 | Wt 158.0 lb

## 2016-09-16 DIAGNOSIS — O9989 Other specified diseases and conditions complicating pregnancy, childbirth and the puerperium: Secondary | ICD-10-CM | POA: Diagnosis not present

## 2016-09-16 DIAGNOSIS — Z3A25 25 weeks gestation of pregnancy: Secondary | ICD-10-CM

## 2016-09-16 DIAGNOSIS — O2692 Pregnancy related conditions, unspecified, second trimester: Secondary | ICD-10-CM | POA: Diagnosis not present

## 2016-09-16 DIAGNOSIS — Z1389 Encounter for screening for other disorder: Secondary | ICD-10-CM

## 2016-09-16 DIAGNOSIS — Z331 Pregnant state, incidental: Secondary | ICD-10-CM

## 2016-09-16 DIAGNOSIS — O210 Mild hyperemesis gravidarum: Secondary | ICD-10-CM | POA: Diagnosis not present

## 2016-09-16 DIAGNOSIS — Z3482 Encounter for supervision of other normal pregnancy, second trimester: Secondary | ICD-10-CM

## 2016-09-16 LAB — POCT URINALYSIS DIPSTICK
Blood, UA: NEGATIVE
GLUCOSE UA: NEGATIVE
KETONES UA: NEGATIVE
LEUKOCYTES UA: NEGATIVE
Nitrite, UA: NEGATIVE
Protein, UA: 1

## 2016-09-16 MED ORDER — OMEPRAZOLE 20 MG PO CPDR: 20.0000 mg | DELAYED_RELEASE_CAPSULE | Freq: Two times a day (BID) | ORAL | 6 refills | Status: DC

## 2016-09-16 MED ORDER — BUPROPION HCL ER (XL) 300 MG PO TB24: 300.0000 mg | ORAL_TABLET | Freq: Every day | ORAL | 1 refills | Status: DC

## 2016-09-16 NOTE — Progress Notes (Signed)
G2P1001 [redacted]w[redacted]d Estimated Date of Delivery: 12/28/16  Blood pressure 100/60, pulse 84, weight 158 lb (71.7 kg), last menstrual period 03/23/2016.   BP weight and urine results all reviewed and noted.  Please refer to the obstetrical flow sheet for the fundal height and fetal heart rate documentation:  Patient reports good fetal movement, denies any bleeding and no rupture of membranes symptoms or regular contractions. Patient is without complaints. All questions were answered.  Orders Placed This Encounter  Procedures  . POCT urinalysis dipstick    Plan:  Continued routine obstetrical care, increase omeprazole to 20 mg BID, increase melatonin to 3-10 mg titrate to effect, also increase wellbutrin to 300 mg xl daily  Pt continues to struggle with this pregnancy with GERD, nausea, fatigue, poor sleeping, etc have addressed this with patient and hopefully the changes will make a difference  Return in about 3 weeks (around 10/07/2016) for PN2, , LROB.

## 2016-09-18 ENCOUNTER — Telehealth: Payer: Self-pay | Admitting: *Deleted

## 2016-09-18 NOTE — Telephone Encounter (Signed)
Patient called stating she has been throwing up since Monday and has not been able to keep anything down, including her medications. States she does have a couple of phenergan suppositories left but they are painful to use. Advised patient to try regardless of the pain. Encouraged to eat small bites along with sips of fluid not large amounts. Pt verbalized understanding.

## 2016-09-19 ENCOUNTER — Ambulatory Visit (INDEPENDENT_AMBULATORY_CARE_PROVIDER_SITE_OTHER): Payer: Medicaid Other | Admitting: Gastroenterology

## 2016-09-19 ENCOUNTER — Encounter: Payer: Self-pay | Admitting: Obstetrics & Gynecology

## 2016-09-19 ENCOUNTER — Encounter: Payer: Self-pay | Admitting: Women's Health

## 2016-09-19 ENCOUNTER — Encounter: Payer: Self-pay | Admitting: Gastroenterology

## 2016-09-19 DIAGNOSIS — K219 Gastro-esophageal reflux disease without esophagitis: Secondary | ICD-10-CM | POA: Diagnosis not present

## 2016-09-19 DIAGNOSIS — K5909 Other constipation: Secondary | ICD-10-CM

## 2016-09-19 MED ORDER — LIDOCAINE VISCOUS 2 % MT SOLN: OROMUCOSAL | 1 refills | Status: DC

## 2016-09-19 NOTE — Progress Notes (Signed)
Subjective:    Patient ID: Charlotte Mccormick, female    DOB: 11/25/1985, 31 y.o.   MRN: [id number]  HPI Still having heartburn. GYN MANAGING. SYMPTOMS NOT IDEALLY CONTROLLED. CURRENTLY [redacted] WEEKS PREGNANT(2ND KID, 1ST ONE: AGE 83). WONDERED ABOUT RESULTS OF [place]. PASSES OUT IF HER SUGAR DROPS. ERROR IN PAPERWORK DISABILITY. ON SHORT TERM DISABILITY DUE TO THROWING UP AND LOSING WEIGHT THIS WHOLE PREGNANCY. WORKS AT [employer]. MARRIED: 2 MOS. WEIGHT AUG 2017: 150 LBS. BMS: CONSTIPATION BUT ON IRON & MIRALAX. OCCASIONAL CHEST PAIN: BURNING, EVERY DAY, SX LAST 2 HRS, HR BEATS FAST, THROAT FEEL LIKE IT'S ON FIRE. IS DUE JUL 2018.  PT DENIES FEVER, CHILLS, HEMATOCHEZIA, HEMATEMESIS, melena, diarrhea, CHEST PAIN, SHORTNESS OF BREATH,  CHANGE IN BOWEL IN HABITS, abdominal pain, OR problems swallowing.  Past Medical History:  Diagnosis Date  . Abnormal uterine bleeding (AUB) 12/12/2015  . Anemia   . Anxiety   . Asthma   . Constipation   . Depression   . Fibroid   . Fibroid   . Genital herpes   . GERD (gastroesophageal reflux disease)   . History of chlamydia   . PTSD (post-traumatic stress disorder)   . Sinusitis   . Vaginal dryness 12/12/2015   Past Surgical History:  Procedure Laterality Date  . COLONOSCOPY N/A 04/08/2016   Procedure: COLONOSCOPY;  Surgeon: Danie Binder, MD;  Location: [facility];  Service: Endoscopy;  Laterality: N/A;  10:15 AM  . ESOPHAGOGASTRODUODENOSCOPY N/A 04/08/2016   Procedure: ESOPHAGOGASTRODUODENOSCOPY (EGD);  Surgeon: Danie Binder, MD;  Location: [facility];  Service: Endoscopy;  Laterality: N/A;   Allergies  Allergen Reactions  . Bee Venom Other (See Comments)    Reaction:  Localized swelling   . Penicillins Itching and Other (See Comments)      . Reglan [Metoclopramide]    Current Outpatient Prescriptions  Medication Sig Dispense Refill  . acetaminophen (TYLENOL) 500 MG tablet Take 500 mg by mouth every 6 (six) hours as needed for  mild pain or headache.    Marland Kitchen buPROPion (WELLBUTRIN XL) 300 MG 24 hr tablet Take 1 tablet (300 mg total) by mouth daily.    Marland Kitchen buPROPion (WELLBUTRIN) 100 MG tablet Take 100 mg by mouth daily.    Marland Kitchen co-enzyme Q-10 30 MG capsule Take 100 mg by mouth 3 (three) times daily.    . ferrous sulfate 325 (65 FE) MG EC tablet Take 65 mg by mouth 3 (three) times daily with meals.    . magnesium oxide (MAG-OX) 400 MG tablet Take 400 mg by mouth daily.    . Melatonin 1 MG TABS Take by mouth.    . multivitamin (VIT W/EXTRA C) CHEW chewable tablet Chew 1 tablet by mouth at bedtime as needed and may repeat dose one time if needed.     Marland Kitchen omeprazole (PRILOSEC) 20 MG capsule Take 1 capsule (20 mg total) by mouth 2 (two) times daily before a meal. 1 tablet a day    . ondansetron (ZOFRAN-ODT) 8 MG disintegrating tablet DISSOLVE 1 TABLET(8 MG) ON THE TONGUE EVERY 8 HOURS AS NEEDED FOR NAUSEA OR VOMITING    . potassium chloride (K-DUR,KLOR-CON) 10 MEQ tablet TAKE 4 TABLETS(40 MEQ) BY MOUTH TWICE DAILY    . PROVENTIL HFA 108 (90 Base) MCG/ACT inhaler INHALE 2 PUFFS INTO THE LUNGS EVERY 6 HOURS AS NEEDED FOR WHEEZING OR SHORTNESS OF BREATH    . pyridOXINE (VITAMIN B-6) 100 MG tablet Take 100 mg  by mouth 2 (two) times daily.    . Riboflavin (VITAMIN B-2 PO) Take by mouth.    .      Nada Libman SUPP      Review of Systems PER HPI OTHERWISE ALL SYSTEMS ARE NEGATIVE.    Objective:   Physical Exam  Constitutional: She is oriented to person, place, and time. She appears well-developed and well-nourished. No distress.  HENT:  Head: Normocephalic and atraumatic.  Mouth/Throat: Oropharynx is clear and moist. No oropharyngeal exudate.  Eyes: Pupils are equal, round, and reactive to light. No scleral icterus.  Neck: Normal range of motion. Neck supple.  Cardiovascular: Normal rate, regular rhythm and normal heart sounds.   Pulmonary/Chest: Effort normal and breath sounds normal. No respiratory distress.  Abdominal: Soft. Bowel  sounds are normal. She exhibits distension. There is no tenderness.  Musculoskeletal: She exhibits no edema.  Lymphadenopathy:    She has no cervical adenopathy.  Neurological: She is alert and oriented to person, place, and time.  NO FOCAL DEFICITS  Psychiatric:  FLAT AFFECT, SLIGHTLY ANXIOUS MOOD  Vitals reviewed.     Assessment & Plan:

## 2016-09-19 NOTE — Patient Instructions (Addendum)
DRINK WATER TO KEEP YOUR URINE LIGHT YELLOW.  CONTINUE MIRALAX TO AVOID CONSTIPATION.  AVOID REFLUX TRIGGERS. SEE INFO BELOW.  CONTINUE OMEPRAZOLE.  TAKE 30 MINUTES PRIOR TO YOUR MEALS TWICE DAILY.  USE VISCOUS LIDOCAINE 2 TSP 30 MINN BEFORE MEALS TO MANAGE FOR CHEST, BURNING MOUTH PAIN UPPER ABDOMINAL PAIN, OR HEARTBURN. IT WILL MAKE YOUR MOUTH, ESOPHAGUS, AND STOMACH NUMB.  FOLLOW UP IN 6 MOS.     Lifestyle and home remedies TO MANAGE REFLUX/CHEST PAIN/HEARTBURN  You may eliminate or reduce the frequency of heartburn by making the following lifestyle changes:  . Control your weight. Being overweight is a major risk factor for heartburn and GERD. Excess pounds put pressure on your abdomen, pushing up your stomach and causing acid to back up into your esophagus.   . Eat smaller meals. 4 TO 6 MEALS A DAY. This reduces pressure on the lower esophageal sphincter, helping to prevent the valve from opening and acid from washing back into your esophagus.   Dolphus Jenny your belt. Clothes that fit tightly around your waist put pressure on your abdomen and the lower esophageal sphincter.   . Eliminate heartburn triggers. Everyone has specific triggers. Common triggers such as fatty or fried foods, spicy food, tomato sauce, carbonated beverages, alcohol, chocolate, mint, garlic, onion, caffeine and nicotine may make heartburn worse.   Marland Kitchen Avoid stooping or bending. Tying your shoes is OK. Bending over for longer periods to weed your garden isn't, especially soon after eating.   . Don't lie down after a meal. Wait at least three to four hours after eating before going to bed, and don't lie down right after eating.   Marland Kitchen PUT THE HEAD OF YOUR BED ON 6 INCH BLOCKS OR SLEEP ON A WEDGE.   Alternative medicine . Several home remedies exist for treating GERD, but they provide only temporary relief. They include drinking baking soda (sodium bicarbonate) added to water or drinking other fluids such as  baking soda mixed with cream of tartar and water.  . Although these liquids create temporary relief by neutralizing, washing away or buffering acids, eventually they aggravate the situation by adding gas and fluid to your stomach, increasing pressure and causing more acid reflux. Further, adding more sodium to your diet may increase your blood pressure and add stress to your heart, and excessive bicarbonate ingestion can alter the acid-base balance in your body.      Low-Fat Diet BREADS, CEREALS, PASTA, RICE, DRIED PEAS, AND BEANS These products are high in carbohydrates and most are low in fat. Therefore, they can be increased in the diet as substitutes for fatty foods. They too, however, contain calories and should not be eaten in excess. Cereals can be eaten for snacks as well as for breakfast.   FRUITS AND VEGETABLES It is good to eat fruits and vegetables. Besides being sources of fiber, both are rich in vitamins and some minerals. They help you get the daily allowances of these nutrients. Fruits and vegetables can be used for snacks and desserts.  MEATS Limit lean meat, chicken, [place], and fish to no more than 6 ounces per day. Beef, Pork, and Lamb Use lean cuts of beef, pork, and lamb. Lean cuts include:  Extra-lean ground beef.  Arm roast.  Sirloin tip.  Center-cut ham.  Round steak.  Loin chops.  Rump roast.  Tenderloin.  Trim all fat off the outside of meats before cooking. It is not necessary to severely decrease the intake of red meat, but  lean choices should be made. Lean meat is rich in protein and contains a highly absorbable form of iron. Premenopausal women, in particular, should avoid reducing lean red meat because this could increase the risk for low red blood cells (iron-deficiency anemia).  Chicken and [place] These are good sources of protein. The fat of poultry can be reduced by removing the skin and underlying fat layers before cooking. Chicken and [place] can be  substituted for lean red meat in the diet. Poultry should not be fried or covered with high-fat sauces. Fish and Shellfish Fish is a good source of protein. Shellfish contain cholesterol, but they usually are low in saturated fatty acids. The preparation of fish is important. Like chicken and [place], they should not be fried or covered with high-fat sauces. EGGS Egg whites contain no fat or cholesterol. They can be eaten often. Try 1 to 2 egg whites instead of whole eggs in recipes or use egg substitutes that do not contain yolk. MILK AND DAIRY PRODUCTS Use skim or 1% milk instead of 2% or whole milk. Decrease whole milk, natural, and processed cheeses. Use nonfat or low-fat (2%) cottage cheese or low-fat cheeses made from vegetable oils. Choose nonfat or low-fat (1 to 2%) yogurt. Experiment with evaporated skim milk in recipes that call for heavy cream. Substitute low-fat yogurt or low-fat cottage cheese for sour cream in dips and salad dressings. Have at least 2 servings of low-fat dairy products, such as 2 glasses of skim (or 1%) milk each day to help get your daily calcium intake. FATS AND OILS Reduce the total intake of fats, especially saturated fat. Butterfat, lard, and beef fats are high in saturated fat and cholesterol. These should be avoided as much as possible. Vegetable fats do not contain cholesterol, but certain vegetable fats, such as coconut oil, palm oil, and palm kernel oil are very high in saturated fats. These should be limited. These fats are often used in bakery goods, processed foods, popcorn, oils, and nondairy creamers. Vegetable shortenings and some peanut butters contain hydrogenated oils, which are also saturated fats. Read the labels on these foods and check for saturated vegetable oils. Unsaturated vegetable oils and fats do not raise blood cholesterol. However, they should be limited because they are fats and are high in calories. Total fat should still be limited to 30% of  your daily caloric intake. Desirable liquid vegetable oils are corn oil, cottonseed oil, olive oil, canola oil, safflower oil, soybean oil, and sunflower oil. Peanut oil is not as good, but small amounts are acceptable. Buy a heart-healthy tub margarine that has no partially hydrogenated oils in the ingredients. Mayonnaise and salad dressings often are made from unsaturated fats, but they should also be limited because of their high calorie and fat content. Seeds, nuts, peanut butter, olives, and avocados are high in fat, but the fat is mainly the unsaturated type. These foods should be limited mainly to avoid excess calories and fat. OTHER EATING TIPS Snacks  Most sweets should be limited as snacks. They tend to be rich in calories and fats, and their caloric content outweighs their nutritional value. Some good choices in snacks are graham crackers, melba toast, soda crackers, bagels (no egg), English muffins, fruits, and vegetables. These snacks are preferable to snack crackers, [place] fries, TORTILLA CHIPS, and POTATO chips. Popcorn should be air-popped or cooked in small amounts of liquid vegetable oil. Desserts Eat fruit, low-fat yogurt, and fruit ices instead of pastries, cake, and cookies. Sherbet, angel  food cake, gelatin dessert, frozen low-fat yogurt, or other frozen products that do not contain saturated fat (pure fruit juice bars, frozen ice pops) are also acceptable.  COOKING METHODS Choose those methods that use little or no fat. They include: Poaching.  Braising.  Steaming.  Grilling.  Baking.  Stir-frying.  Broiling.  Microwaving.  Foods can be cooked in a nonstick pan without added fat, or use a nonfat cooking spray in regular cookware. Limit fried foods and avoid frying in saturated fat. Add moisture to lean meats by using water, broth, cooking wines, and other nonfat or low-fat sauces along with the cooking methods mentioned above. Soups and stews should be chilled after cooking.  The fat that forms on top after a few hours in the refrigerator should be skimmed off. When preparing meals, avoid using excess salt. Salt can contribute to raising blood pressure in some people.  EATING AWAY FROM HOME Order entres, potatoes, and vegetables without sauces or butter. When meat exceeds the size of a deck of cards (3 to 4 ounces), the rest can be taken home for another meal. Choose vegetable or fruit salads and ask for low-calorie salad dressings to be served on the side. Use dressings sparingly. Limit high-fat toppings, such as bacon, crumbled eggs, cheese, sunflower seeds, and olives. Ask for heart-healthy tub margarine instead of butter.

## 2016-09-19 NOTE — Assessment & Plan Note (Signed)
SYMPTOMS NOT CONTROLLED ON BID PPI. EXPERIENCED TEMPORARY RELIEF FROM GI COCKTAIL.   CONTINUE OMEPRAZOLE.  TAKE 30 MINUTES PRIOR TO YOUR MEALS TWICE DAILY. AVOID REFLUX TRIGGERS.  HANDOUT GIVEN. YOU CAN USE VISCOUS LIDOCAINE 2 TSP 30 MINN BEFORE MEALS TO MANAGE FOR CHEST, BURNING MOUTH PAIN UPPER ABDOMINAL PAIN, OR HEARTBURN. IT WILL MAKE YOUR MOUTH, ESOPHAGUS, AND STOMACH NUMB. MICROMEDEX REVIEWED. NO STUDIES TO SUGGEST ADVERSE EFFECTS ON FETUS. FOLLOW UP IN 6 MOS.

## 2016-09-19 NOTE — Assessment & Plan Note (Signed)
SYMPTOMS FAIRLY WELL CONTROLLED ON MIRALAX.  DRINK WATER TO KEEP YOUR URINE LIGHT YELLOW. CONTINUE MIRALAX TO AVOID CONSTIPATION.

## 2016-09-19 NOTE — Progress Notes (Signed)
ON RECALL  °

## 2016-09-19 NOTE — Progress Notes (Signed)
No pcp per patient 

## 2016-09-19 NOTE — Addendum Note (Signed)
Addended by: Danie Binder on: 09/19/2016 10:53 AM   Modules accepted: Orders

## 2016-09-20 ENCOUNTER — Inpatient Hospital Stay (HOSPITAL_COMMUNITY)
Admission: AD | Admit: 2016-09-20 | Discharge: 2016-09-20 | Disposition: A | Discharge status: Home / Self Care | Payer: Medicaid Other | Source: Ambulatory Visit | Attending: Family Medicine | Admitting: Family Medicine

## 2016-09-20 ENCOUNTER — Encounter (HOSPITAL_COMMUNITY): Payer: Self-pay | Admitting: *Deleted

## 2016-09-20 DIAGNOSIS — Z79899 Other long term (current) drug therapy: Secondary | ICD-10-CM | POA: Insufficient documentation

## 2016-09-20 DIAGNOSIS — Z91030 Bee allergy status: Secondary | ICD-10-CM | POA: Insufficient documentation

## 2016-09-20 DIAGNOSIS — Z3A25 25 weeks gestation of pregnancy: Secondary | ICD-10-CM | POA: Insufficient documentation

## 2016-09-20 DIAGNOSIS — Z87891 Personal history of nicotine dependence: Secondary | ICD-10-CM | POA: Diagnosis not present

## 2016-09-20 DIAGNOSIS — Z880 Allergy status to penicillin: Secondary | ICD-10-CM | POA: Insufficient documentation

## 2016-09-20 DIAGNOSIS — O99012 Anemia complicating pregnancy, second trimester: Secondary | ICD-10-CM | POA: Insufficient documentation

## 2016-09-20 DIAGNOSIS — O210 Mild hyperemesis gravidarum: Secondary | ICD-10-CM | POA: Insufficient documentation

## 2016-09-20 HISTORY — DX: Vomiting, unspecified: R11.10

## 2016-09-20 LAB — COMPREHENSIVE METABOLIC PANEL
ALBUMIN: 3.1 g/dL — AB (ref 3.5–5.0)
ALK PHOS: 55 U/L (ref 38–126)
ALT: 13 U/L — AB (ref 14–54)
AST: 21 U/L (ref 15–41)
Anion gap: 7 (ref 5–15)
BUN: 9 mg/dL (ref 6–20)
CO2: 22 mmol/L (ref 22–32)
CREATININE: 0.48 mg/dL (ref 0.44–1.00)
Calcium: 8.8 mg/dL — ABNORMAL LOW (ref 8.9–10.3)
Chloride: 105 mmol/L (ref 101–111)
GFR calc non Af Amer: >60 mL/min (ref >60)
GLUCOSE: 75 mg/dL (ref 65–99)
Potassium: 4 mmol/L (ref 3.5–5.1)
SODIUM: 134 mmol/L — AB (ref 135–145)
Total Bilirubin: 0.7 mg/dL (ref 0.3–1.2)
Total Protein: 6.8 g/dL (ref 6.5–8.1)

## 2016-09-20 LAB — CBC
HCT: 28.8 % — ABNORMAL LOW (ref 36.0–46.0)
HEMOGLOBIN: 8.8 g/dL — AB (ref 12.0–15.0)
MCH: 21.2 pg — AB (ref 26.0–34.0)
MCHC: 30.6 g/dL (ref 30.0–36.0)
MCV: 69.2 fL — ABNORMAL LOW (ref 78.0–100.0)
Platelets: 171 10*3/uL (ref 150–400)
RBC: 4.16 MIL/uL (ref 3.87–5.11)
RDW: 19.2 % — ABNORMAL HIGH (ref 11.5–15.5)
WBC: 7.7 10*3/uL (ref 4.0–10.5)

## 2016-09-20 LAB — URINALYSIS, ROUTINE W REFLEX MICROSCOPIC
BACTERIA UA: NONE SEEN
Bilirubin Urine: NEGATIVE
Glucose, UA: NEGATIVE mg/dL
Hgb urine dipstick: NEGATIVE
KETONES UR: NEGATIVE mg/dL
LEUKOCYTES UA: NEGATIVE
Nitrite: NEGATIVE
Protein, ur: NEGATIVE mg/dL
Specific Gravity, Urine: 1.023 (ref 1.005–1.030)
WBC, UA: NONE SEEN WBC/hpf (ref 0–5)
pH: 7 (ref 5.0–8.0)

## 2016-09-20 MED ORDER — SCOPOLAMINE 1 MG/3DAYS TD PT72: 1.0000 | MEDICATED_PATCH | TRANSDERMAL | 12 refills | Status: DC

## 2016-09-20 MED ORDER — PANTOPRAZOLE SODIUM 40 MG IV SOLR
40.0000 mg | Freq: Once | INTRAVENOUS | Status: AC
  Administered 2016-09-20: 40 mg via INTRAVENOUS
  Filled 2016-09-20: qty 40

## 2016-09-20 MED ORDER — SCOPOLAMINE 1 MG/3DAYS TD PT72: 1.0000 | MEDICATED_PATCH | TRANSDERMAL | Status: DC

## 2016-09-20 MED ORDER — PROMETHAZINE HCL 25 MG/ML IJ SOLN
25.0000 mg | Freq: Once | INTRAVENOUS | Status: AC
  Administered 2016-09-20: 25 mg via INTRAVENOUS
  Filled 2016-09-20: qty 1

## 2016-09-20 MED ORDER — MENTHOL 3 MG MT LOZG: 1.0000 | LOZENGE | OROMUCOSAL | 0 refills | Status: DC | PRN

## 2016-09-20 MED ORDER — MENTHOL 3 MG MT LOZG
1.0000 | LOZENGE | Freq: Once | OROMUCOSAL | Status: AC
  Administered 2016-09-20: 3 mg via ORAL
  Filled 2016-09-20: qty 9

## 2016-09-20 NOTE — MAU Note (Signed)
c/o N&V since [redacted] weeks gestation; c/o GERD since [redacted] weeks gestation also; c/o weakness- has had anemia, Hgb 8.5 last Friday; has Zofran rx at home;

## 2016-09-20 NOTE — Discharge Instructions (Signed)
Eating Plan for Hyperemesis Gravidarum Hyperemesis gravidarum is a severe form of morning sickness. Because this condition causes severe nausea and vomiting, it can lead to dehydration, malnutrition, and weight loss. One way to lessen the symptoms of nausea and vomiting is to follow the eating plan for hyperemesis gravidarum. It is often used along with prescribed medicines to control your symptoms. What can I do to relieve my symptoms? Listen to your body. Everyone is different and has different preferences. Find what works best for you. Take any of the following actions that are helpful to you:  Eat and drink slowly.  Eat 5-6 small meals daily instead of 3 large meals.  Eat crackers before you get out of bed in the morning.  Try having a snack in the middle of the night.  Starchy foods are usually tolerated well. Examples include cereal, toast, bread, potatoes, pasta, rice, and pretzels.  Ginger may help with nausea. Add  tsp ground ginger to hot tea or choose ginger tea.  Try drinking 100% fruit juice or an electrolyte drink. An electrolyte drink contains sodium, potassium, and chloride.  Continue to take your prenatal vitamins as told by your health care provider. If you are having trouble taking your prenatal vitamins, talk with your health care provider about different options.  Include at least 1 serving of protein with your meals and snacks. Protein options include meats or poultry, beans, nuts, eggs, and yogurt. Try eating a protein-rich snack before bed. Examples of these snacks include cheese and crackers or half of a peanut butter or [place] sandwich.  Consider eliminating foods that trigger your symptoms. These may include spicy foods, coffee, high-fat foods, very sweet foods, and acidic foods.  Try meals that have more protein combined with bland, salty, lower-fat, and dry foods, such as nuts, seeds, pretzels, crackers, and cereal.  Talk with your healthcare provider about  starting a supplement of vitamin B6.  Have fluids that are cold, clear, and carbonated or sour. Examples include lemonade, ginger ale, lemon-lime soda, ice water, and sparkling water.  Try lemon or mint tea.  Try brushing your teeth or using a mouth rinse after meals. What should I avoid to reduce my symptoms? Avoiding some of the following things may help reduce your symptoms.  Foods with strong smells. Try eating meals in well-ventilated areas that are free of odors.  Drinking water or other beverages with meals. Try not to drink anything during the 30 minutes before and after your meals.  Drinking more than 1 cup of fluid at a time. Sometimes using a straw helps.  Fried or high-fat foods, such as butter and cream sauces.  Spicy foods.  Skipping meals as best as you can. Nausea can be more intense on an empty stomach. If you cannot tolerate food at that time, do not force it. Try sucking on ice chips or other frozen items, and make up for missed calories later.  Lying down within 2 hours after eating.  Environmental triggers. These may include smoky rooms, closed spaces, rooms with strong smells, warm or humid places, overly loud and noisy rooms, and rooms with motion or flickering lights.  Quick and sudden changes in your movement. This information is not intended to replace advice given to you by your health care provider. Make sure you discuss any questions you have with your health care provider. Document Released: 03/17/2007 Document Revised: 01/17/2016 Document Reviewed: 12/19/2015 Elsevier Interactive Patient Education  2017 [employer] Anemia, Nonspecific Anemia is a condition  in which the concentration of red blood cells or hemoglobin in the blood is below normal. Hemoglobin is a substance in red blood cells that carries oxygen to the tissues of the body. Anemia results in not enough oxygen reaching these tissues. What are the causes? Common causes of anemia  include:  Excessive bleeding. Bleeding may be internal or external. This includes excessive bleeding from periods (in women) or from the intestine.  Poor nutrition.  Chronic kidney, thyroid, and liver disease.  Bone marrow disorders that decrease red blood cell production.  Cancer and treatments for cancer.  HIV, AIDS, and their treatments.  Spleen problems that increase red blood cell destruction.  Blood disorders.  Excess destruction of red blood cells due to infection, medicines, and autoimmune disorders. What are the signs or symptoms?  Minor weakness.  Dizziness.  Headache.  Palpitations.  Shortness of breath, especially with exercise.  Paleness.  Cold sensitivity.  Indigestion.  Nausea.  Difficulty sleeping.  Difficulty concentrating. Symptoms may occur suddenly or they may develop slowly. How is this diagnosed? Additional blood tests are often needed. These help your health care provider determine the best treatment. Your health care provider will check your stool for blood and look for other causes of blood loss. How is this treated? Treatment varies depending on the cause of the anemia. Treatment can include:  Supplements of iron, vitamin L27, or folic acid.  Hormone medicines.  A blood transfusion. This may be needed if blood loss is severe.  Hospitalization. This may be needed if there is significant continual blood loss.  Dietary changes.  Spleen removal. Follow these instructions at home: Keep all follow-up appointments. It often takes many weeks to correct anemia, and having your health care provider check on your condition and your response to treatment is very important. Get help right away if:  You develop extreme weakness, shortness of breath, or chest pain.  You become dizzy or have trouble concentrating.  You develop heavy vaginal bleeding.  You develop a rash.  You have bloody or black, tarry stools.  You faint.  You  vomit up blood.  You vomit repeatedly.  You have abdominal pain.  You have a fever or persistent symptoms for more than 2-3 days.  You have a fever and your symptoms suddenly get worse.  You are dehydrated. This information is not intended to replace advice given to you by your health care provider. Make sure you discuss any questions you have with your health care provider. Document Released: 06/27/2004 Document Revised: 11/01/2015 Document Reviewed: 11/13/2012 Elsevier Interactive Patient Education  2017 [employer].

## 2016-09-20 NOTE — MAU Provider Note (Signed)
History     CSN: [id number]  Arrival date and time: 09/20/16 1208  First Provider Initiated Contact with Patient 09/20/16 1321      Chief Complaint  Patient presents with  . Emesis During Pregnancy  . Gastroesophageal Reflux  . Anemia   HPI Charlotte Mccormick is a 31 y.o. G2P1001 at [redacted]w[redacted]d who presents with nausea & vomiting. Patient had HEG with this pregnancy & was seen by GI yesterday for GERD. Is currently taking zofran, phenergan, vit b6, prilosec, & lidocaine. Reports vomiting once yesterday & 3 times this morning; has not taken any antiemetics today. Used phenergan suppository yesterday. States she hasn't been able to keep down any food or fluids today. Also reports feeling weak "all the time". States she was told her hemoglobin was 8.5 last Friday at the [facility] office; her & Hoskins notified Loiza. Patient concerned regarding low hemoglobin. Denies abdominal pain, diarrhea, fever, LOC, headache. Positive fetal movement.  OB History    Gravida Para Term Preterm AB Living   2 1 1     1    SAB TAB Ectopic Multiple Live Births           1      Past Medical History:  Diagnosis Date  . Abnormal uterine bleeding (AUB) 12/12/2015  . Anemia   . Anxiety   . Asthma   . Constipation   . Depression   . Fibroid   . Fibroid   . Genital herpes   . GERD (gastroesophageal reflux disease)   . History of chlamydia   . Hyperemesis   . PTSD (post-traumatic stress disorder)   . Sinusitis   . Vaginal dryness 12/12/2015    Past Surgical History:  Procedure Laterality Date  . COLONOSCOPY N/A 04/08/2016   Procedure: COLONOSCOPY;  Surgeon: Danie Binder, MD;  Location: [facility];  Service: Endoscopy;  Laterality: N/A;  10:15 AM  . ESOPHAGOGASTRODUODENOSCOPY N/A 04/08/2016   Procedure: ESOPHAGOGASTRODUODENOSCOPY (EGD);  Surgeon: Danie Binder, MD;  Location: [facility];  Service: Endoscopy;  Laterality: N/A;  . PERINEUM REPAIR      Family History  Problem Relation Age of Onset   . Heart disease Mother   . Hypertension Mother   . Hypertension Father   . Hyperlipidemia Father   . Diabetes Father   . Epilepsy Brother   . Eczema Daughter   . Colon cancer Neg Hx     Social History  Substance Use Topics  . Smoking status: Former Smoker    Packs/day: 0.50    Years: 5.00    Types: Cigarettes    Quit date: 06/03/2010  . Smokeless tobacco: Former Systems developer  . Alcohol use No     Comment: 03/19/16 currently no ETOH due to meds; Previously: Wine three times a week    Allergies:  Allergies  Allergen Reactions  . Bee Venom Other (See Comments)    Reaction:  Localized swelling   . Penicillins Itching and Other (See Comments)    Has patient had a PCN reaction causing immediate rash, facial/tongue/throat swelling, SOB or lightheadedness with hypotension: No Has patient had a PCN reaction causing severe rash involving mucus membranes or skin necrosis: No Has patient had a PCN reaction that required hospitalization No Has patient had a PCN reaction occurring within the last 10 years: Yes If all of the above answers are "NO", then may proceed with Cephalosporin use.   . Reglan [Metoclopramide]     Prescriptions Prior to Admission  Medication Sig Dispense Refill Last Dose  . acetaminophen (TYLENOL) 500 MG tablet Take 500 mg by mouth every 6 (six) hours as needed for mild pain or headache.   Taking  . Alum & Mag Hydroxide-Simeth (GI COCKTAIL) SUSP suspension 10 cc bid prn (Patient not taking: Reported on 09/16/2016) 100 mL 1 Not Taking  . buPROPion (WELLBUTRIN XL) 300 MG 24 hr tablet Take 1 tablet (300 mg total) by mouth daily. 30 tablet 1 Taking  . buPROPion (WELLBUTRIN) 100 MG tablet Take 100 mg by mouth daily.   Taking  . co-enzyme Q-10 30 MG capsule Take 100 mg by mouth 3 (three) times daily.   Taking  . ferrous sulfate 325 (65 FE) MG EC tablet Take 65 mg by mouth 3 (three) times daily with meals.   Taking  . lidocaine (XYLOCAINE) 2 % solution 2 TSP  PO 30 MINUTES PRIOR  TO MEALS TID TO REDUCE MOUTH, ABDOMINAL, OR CHEST PAIN. MAY REPEAT DOSE EVERY 4 HOURS. 300 mL 1   . magnesium oxide (MAG-OX) 400 MG tablet Take 400 mg by mouth daily.   Taking  . Melatonin 1 MG TABS Take by mouth.   Taking  . multivitamin (VIT W/EXTRA C) CHEW chewable tablet Chew 1 tablet by mouth at bedtime as needed and may repeat dose one time if needed.    Taking  . omeprazole (PRILOSEC) 20 MG capsule Take 1 capsule (20 mg total) by mouth 2 (two) times daily before a meal. 1 tablet a day 60 capsule 6 Taking  . ondansetron (ZOFRAN-ODT) 8 MG disintegrating tablet DISSOLVE 1 TABLET(8 MG) ON THE TONGUE EVERY 8 HOURS AS NEEDED FOR NAUSEA OR VOMITING 20 tablet 3 Taking  . potassium chloride (K-DUR,KLOR-CON) 10 MEQ tablet TAKE 4 TABLETS(40 MEQ) BY MOUTH TWICE DAILY 24 tablet 0 Taking  . promethazine (PHENERGAN) 25 MG tablet Take 0.5-1 tablets (12.5-25 mg total) by mouth every 6 (six) hours as needed for nausea or vomiting. (Patient not taking: Reported on 09/16/2016) 30 tablet 0 Not Taking  . PROVENTIL HFA 108 (90 Base) MCG/ACT inhaler INHALE 2 PUFFS INTO THE LUNGS EVERY 6 HOURS AS NEEDED FOR WHEEZING OR SHORTNESS OF BREATH 6.7 g 2 Taking  . pyridOXINE (VITAMIN B-6) 100 MG tablet Take 100 mg by mouth 2 (two) times daily.   Taking  . Riboflavin (VITAMIN B-2 PO) Take by mouth.   Taking    Review of Systems  Constitutional: Positive for fatigue. Negative for fever.  HENT: Positive for sore throat.   Cardiovascular: Negative for chest pain and palpitations.  Gastrointestinal: Positive for nausea and vomiting. Negative for abdominal pain, constipation and diarrhea.  Genitourinary: Negative.   Neurological: Negative for dizziness, syncope and headaches.   Physical Exam   Blood pressure 114/72, pulse 95, temperature 98 F (36.7 C), temperature source Oral, resp. rate 16, height 5\' 2"  (1.575 m), weight 156 lb (70.8 kg), last menstrual period 03/23/2016.  Physical Exam  Nursing note and vitals  reviewed. Constitutional: She is oriented to person, place, and time. She appears well-developed and well-nourished. No distress.  HENT:  Head: Normocephalic and atraumatic.  Eyes: Conjunctivae are normal. Right eye exhibits no discharge. Left eye exhibits no discharge. No scleral icterus.  Neck: Normal range of motion.  Cardiovascular: Normal rate, regular rhythm and normal heart sounds.   No murmur heard. Respiratory: Effort normal and breath sounds normal. No respiratory distress. She has no wheezes.  GI: Soft. Bowel sounds are normal. There is no tenderness.  Neurological: She  is alert and oriented to person, place, and time.  Skin: Skin is warm and dry. She is not diaphoretic.  Psychiatric: She has a normal mood and affect. Her behavior is normal. Judgment and thought content normal.   Fetal Tracing:  Baseline: 135 Variability: moderate Accelerations: 15x15 Decelerations: none  Toco: none MAU Course  Procedures Results for orders placed or performed during the hospital encounter of 09/20/16 (from the past 24 hour(s))  Urinalysis, Routine w reflex microscopic     Status: Abnormal   Collection Time: 09/20/16 12:25 PM  Result Value Ref Range   Color, Urine YELLOW YELLOW   APPearance TURBID (A) CLEAR   Specific Gravity, Urine 1.023 1.005 - 1.030   pH 7.0 5.0 - 8.0   Glucose, UA NEGATIVE NEGATIVE mg/dL   Hgb urine dipstick NEGATIVE NEGATIVE   Bilirubin Urine NEGATIVE NEGATIVE   Ketones, ur NEGATIVE NEGATIVE mg/dL   Protein, ur NEGATIVE NEGATIVE mg/dL   Nitrite NEGATIVE NEGATIVE   Leukocytes, UA NEGATIVE NEGATIVE   RBC / HPF 0-5 0 - 5 RBC/hpf   WBC, UA NONE SEEN 0 - 5 WBC/hpf   Bacteria, UA NONE SEEN NONE SEEN   Squamous Epithelial / LPF 0-5 (A) NONE SEEN  CBC     Status: Abnormal   Collection Time: 09/20/16  1:43 PM  Result Value Ref Range   WBC 7.7 4.0 - 10.5 K/uL   RBC 4.16 3.87 - 5.11 MIL/uL   Hemoglobin 8.8 (L) 12.0 - 15.0 g/dL   HCT 28.8 (L) 36.0 - 46.0 %   MCV  69.2 (L) 78.0 - 100.0 fL   MCH 21.2 (L) 26.0 - 34.0 pg   MCHC 30.6 30.0 - 36.0 g/dL   RDW 19.2 (H) 11.5 - 15.5 %   Platelets 171 150 - 400 K/uL  Comprehensive metabolic panel     Status: Abnormal   Collection Time: 09/20/16  1:43 PM  Result Value Ref Range   Sodium 134 (L) 135 - 145 mmol/L   Potassium 4.0 3.5 - 5.1 mmol/L   Chloride 105 101 - 111 mmol/L   CO2 22 22 - 32 mmol/L   Glucose, Bld 75 65 - 99 mg/dL   BUN 9 6 - 20 mg/dL   Creatinine, Ser 0.48 0.44 - 1.00 mg/dL   Calcium 8.8 (L) 8.9 - 10.3 mg/dL   Total Protein 6.8 6.5 - 8.1 g/dL   Albumin 3.1 (L) 3.5 - 5.0 g/dL   AST 21 15 - 41 U/L   ALT 13 (L) 14 - 54 U/L   Alkaline Phosphatase 55 38 - 126 U/L   Total Bilirubin 0.7 0.3 - 1.2 mg/dL   GFR calc non Af Amer >60 >60 mL/min   GFR calc Af Amer >60 >60 mL/min   Anion gap 7 5 - 15    MDM Reactive fetal tracing VSS Phenergan 25 mg IV in bag of LR, protonix 40 mg IV Cepacol lozenge per patient request Pt not observed vomiting in MAU & able to keep down graham crackers Weight stable from previous visit CBC -- hemoglobin 8.8  At time of discharge -- pt on phone with after hours nurse regarding current plan of care. Patient insistent on transfusion for her anemia. Discussed transfusion policy. Encouraged patient to discuss tx of anemia & possible infusions with her ob.   Dr. Kennon Rounds notified of patient concern. Dr. Kennon Rounds at bedside discussing plan of care with patient. Pt agreeable to being discharged home with scope patch rx.   Assessment and  Plan  A; 1. Hyperemesis gravidarum   2. Anemia during pregnancy in second trimester    P: Discharge home Continue meds as prescribed HEG diet Rx scopolamine patch Discussed reasons to return to MAU F/u with OB  [name] 09/20/2016, 1:20 PM

## 2016-09-23 ENCOUNTER — Other Ambulatory Visit: Payer: Self-pay | Admitting: Women's Health

## 2016-09-23 ENCOUNTER — Telehealth: Payer: Self-pay | Admitting: *Deleted

## 2016-09-23 NOTE — Telephone Encounter (Signed)
Spoke to patient regarding specifics for FMLA leave.

## 2016-09-26 ENCOUNTER — Encounter: Payer: Self-pay | Admitting: Women's Health

## 2016-09-27 ENCOUNTER — Encounter: Payer: Self-pay | Admitting: Women's Health

## 2016-09-27 ENCOUNTER — Ambulatory Visit (INDEPENDENT_AMBULATORY_CARE_PROVIDER_SITE_OTHER): Payer: Medicaid Other | Admitting: Obstetrics & Gynecology

## 2016-09-27 ENCOUNTER — Encounter: Payer: Self-pay | Admitting: Obstetrics & Gynecology

## 2016-09-27 VITALS — BP 122/70 | HR 99 | Wt 158.0 lb

## 2016-09-27 DIAGNOSIS — O9989 Other specified diseases and conditions complicating pregnancy, childbirth and the puerperium: Secondary | ICD-10-CM | POA: Diagnosis not present

## 2016-09-27 DIAGNOSIS — Z1389 Encounter for screening for other disorder: Secondary | ICD-10-CM | POA: Diagnosis not present

## 2016-09-27 DIAGNOSIS — O26892 Other specified pregnancy related conditions, second trimester: Secondary | ICD-10-CM

## 2016-09-27 DIAGNOSIS — Z3A26 26 weeks gestation of pregnancy: Secondary | ICD-10-CM

## 2016-09-27 DIAGNOSIS — Z3482 Encounter for supervision of other normal pregnancy, second trimester: Secondary | ICD-10-CM

## 2016-09-27 DIAGNOSIS — Z331 Pregnant state, incidental: Secondary | ICD-10-CM

## 2016-09-27 DIAGNOSIS — O210 Mild hyperemesis gravidarum: Secondary | ICD-10-CM | POA: Diagnosis not present

## 2016-09-27 LAB — POCT URINALYSIS DIPSTICK
Blood, UA: NEGATIVE
Glucose, UA: NEGATIVE
KETONES UA: NEGATIVE
Nitrite, UA: NEGATIVE
PROTEIN UA: NEGATIVE

## 2016-09-27 NOTE — Progress Notes (Signed)
G2P1001 [redacted]w[redacted]d Estimated Date of Delivery: 12/28/16  Blood pressure 122/70, pulse 99, weight 158 lb (71.7 kg), last menstrual period 03/23/2016.   BP weight and urine results all reviewed and noted.  Please refer to the obstetrical flow sheet for the fundal height and fetal heart rate documentation:  Patient reports good fetal movement, denies any bleeding and no rupture of membranes symptoms or regular contractions. Patient is without complaints. All questions were answered.  Orders Placed This Encounter  Procedures  . POCT urinalysis dipstick    Plan:  Continued routine obstetrical care, nausea is better  Return in about 2 weeks (around 10/11/2016) for PN2, , LROB.

## 2016-10-07 ENCOUNTER — Other Ambulatory Visit: Payer: Medicaid Other

## 2016-10-07 ENCOUNTER — Ambulatory Visit (INDEPENDENT_AMBULATORY_CARE_PROVIDER_SITE_OTHER): Payer: Medicaid Other | Admitting: Obstetrics & Gynecology

## 2016-10-07 ENCOUNTER — Encounter: Payer: Self-pay | Admitting: Obstetrics & Gynecology

## 2016-10-07 VITALS — BP 96/70 | HR 109 | Wt 155.0 lb

## 2016-10-07 DIAGNOSIS — Z3482 Encounter for supervision of other normal pregnancy, second trimester: Secondary | ICD-10-CM

## 2016-10-07 DIAGNOSIS — Z3483 Encounter for supervision of other normal pregnancy, third trimester: Secondary | ICD-10-CM

## 2016-10-07 DIAGNOSIS — Z331 Pregnant state, incidental: Secondary | ICD-10-CM | POA: Diagnosis not present

## 2016-10-07 DIAGNOSIS — O210 Mild hyperemesis gravidarum: Secondary | ICD-10-CM

## 2016-10-07 DIAGNOSIS — Z3A28 28 weeks gestation of pregnancy: Secondary | ICD-10-CM | POA: Diagnosis not present

## 2016-10-07 DIAGNOSIS — O9989 Other specified diseases and conditions complicating pregnancy, childbirth and the puerperium: Secondary | ICD-10-CM

## 2016-10-07 DIAGNOSIS — Z131 Encounter for screening for diabetes mellitus: Secondary | ICD-10-CM

## 2016-10-07 DIAGNOSIS — Z1389 Encounter for screening for other disorder: Secondary | ICD-10-CM | POA: Diagnosis not present

## 2016-10-07 DIAGNOSIS — O26893 Other specified pregnancy related conditions, third trimester: Secondary | ICD-10-CM

## 2016-10-07 LAB — POCT URINALYSIS DIPSTICK
Blood, UA: NEGATIVE
GLUCOSE UA: NEGATIVE
NITRITE UA: NEGATIVE

## 2016-10-07 NOTE — Progress Notes (Signed)
G2P1001 [redacted]w[redacted]d Estimated Date of Delivery: 12/28/16  Blood pressure 96/70, pulse (!) 109, weight 155 lb (70.3 kg), last menstrual period 03/23/2016.   BP weight and urine results all reviewed and noted.  Please refer to the obstetrical flow sheet for the fundal height and fetal heart rate documentation:  Patient reports good fetal movement, denies any bleeding and no rupture of membranes symptoms or regular contractions. Patient is without complaints. All questions were answered.  Orders Placed This Encounter  Procedures  . HIV antibody  . RPR  . CBC  . HgB A1c  . POCT Urinalysis Dipstick  . Antibody screen    Plan:  Continued routine obstetrical care, chronic N/V with this pregnancy On prilosec, zofran, scop patch, phenergan prn(allergic to reglan)  Return in about 3 weeks (around 10/28/2016).

## 2016-10-08 ENCOUNTER — Telehealth: Payer: Self-pay | Admitting: *Deleted

## 2016-10-08 LAB — CBC
HEMOGLOBIN: 10.2 g/dL — AB (ref 11.1–15.9)
Hematocrit: 33.6 % — ABNORMAL LOW (ref 34.0–46.6)
MCH: 20.9 pg — AB (ref 26.6–33.0)
MCHC: 30.4 g/dL — ABNORMAL LOW (ref 31.5–35.7)
MCV: 69 fL — ABNORMAL LOW (ref 79–97)
Platelets: 269 10*3/uL (ref 150–379)
RBC: 4.87 x10E6/uL (ref 3.77–5.28)
RDW: 20.5 % — ABNORMAL HIGH (ref 12.3–15.4)
WBC: 5.6 10*3/uL (ref 3.4–10.8)

## 2016-10-08 LAB — HEMOGLOBIN A1C
ESTIMATED AVERAGE GLUCOSE: 91 mg/dL
Hgb A1c MFr Bld: 4.8 % (ref 4.8–5.6)

## 2016-10-08 LAB — ANTIBODY SCREEN: Antibody Screen: NEGATIVE

## 2016-10-08 LAB — RPR: RPR: NONREACTIVE

## 2016-10-08 LAB — HIV ANTIBODY (ROUTINE TESTING W REFLEX): HIV SCREEN 4TH GENERATION: NONREACTIVE

## 2016-10-08 NOTE — Telephone Encounter (Signed)
Patient needs return to work note for 10/09/16. Typed, printed and signed. Will pick up in am.

## 2016-10-10 ENCOUNTER — Other Ambulatory Visit: Payer: Self-pay | Admitting: Women's Health

## 2016-10-15 ENCOUNTER — Encounter: Payer: Self-pay | Admitting: Obstetrics & Gynecology

## 2016-10-15 ENCOUNTER — Encounter: Payer: Self-pay | Admitting: Women's Health

## 2016-10-16 ENCOUNTER — Telehealth: Payer: Self-pay | Admitting: Obstetrics & Gynecology

## 2016-10-16 ENCOUNTER — Inpatient Hospital Stay (HOSPITAL_COMMUNITY)
Admission: AD | Admit: 2016-10-16 | Discharge: 2016-10-16 | Disposition: A | Discharge status: Home / Self Care | Payer: Medicaid Other | Source: Ambulatory Visit | Attending: Obstetrics and Gynecology | Admitting: Obstetrics and Gynecology

## 2016-10-16 ENCOUNTER — Encounter (HOSPITAL_COMMUNITY): Payer: Self-pay | Admitting: *Deleted

## 2016-10-16 DIAGNOSIS — K219 Gastro-esophageal reflux disease without esophagitis: Secondary | ICD-10-CM | POA: Diagnosis not present

## 2016-10-16 DIAGNOSIS — J45909 Unspecified asthma, uncomplicated: Secondary | ICD-10-CM | POA: Diagnosis not present

## 2016-10-16 DIAGNOSIS — Z87891 Personal history of nicotine dependence: Secondary | ICD-10-CM | POA: Insufficient documentation

## 2016-10-16 DIAGNOSIS — O219 Vomiting of pregnancy, unspecified: Secondary | ICD-10-CM | POA: Diagnosis not present

## 2016-10-16 DIAGNOSIS — O26893 Other specified pregnancy related conditions, third trimester: Secondary | ICD-10-CM | POA: Diagnosis not present

## 2016-10-16 DIAGNOSIS — G4700 Insomnia, unspecified: Secondary | ICD-10-CM | POA: Diagnosis not present

## 2016-10-16 DIAGNOSIS — Z880 Allergy status to penicillin: Secondary | ICD-10-CM | POA: Insufficient documentation

## 2016-10-16 DIAGNOSIS — R112 Nausea with vomiting, unspecified: Secondary | ICD-10-CM | POA: Insufficient documentation

## 2016-10-16 DIAGNOSIS — O99513 Diseases of the respiratory system complicating pregnancy, third trimester: Secondary | ICD-10-CM | POA: Diagnosis not present

## 2016-10-16 DIAGNOSIS — Z3A29 29 weeks gestation of pregnancy: Secondary | ICD-10-CM | POA: Diagnosis not present

## 2016-10-16 DIAGNOSIS — F4310 Post-traumatic stress disorder, unspecified: Secondary | ICD-10-CM | POA: Insufficient documentation

## 2016-10-16 DIAGNOSIS — O99613 Diseases of the digestive system complicating pregnancy, third trimester: Secondary | ICD-10-CM | POA: Diagnosis not present

## 2016-10-16 DIAGNOSIS — O99353 Diseases of the nervous system complicating pregnancy, third trimester: Secondary | ICD-10-CM | POA: Insufficient documentation

## 2016-10-16 DIAGNOSIS — Z79899 Other long term (current) drug therapy: Secondary | ICD-10-CM | POA: Insufficient documentation

## 2016-10-16 DIAGNOSIS — O99343 Other mental disorders complicating pregnancy, third trimester: Secondary | ICD-10-CM | POA: Diagnosis not present

## 2016-10-16 LAB — URINALYSIS, ROUTINE W REFLEX MICROSCOPIC
BILIRUBIN URINE: NEGATIVE
Bacteria, UA: NONE SEEN
Glucose, UA: NEGATIVE mg/dL
Hgb urine dipstick: NEGATIVE
KETONES UR: NEGATIVE mg/dL
Nitrite: NEGATIVE
PH: 7 (ref 5.0–8.0)
PROTEIN: 30 mg/dL — AB
Specific Gravity, Urine: 1.02 (ref 1.005–1.030)

## 2016-10-16 LAB — COMPREHENSIVE METABOLIC PANEL
ALBUMIN: 3.2 g/dL — AB (ref 3.5–5.0)
ALT: 14 U/L (ref 14–54)
ANION GAP: 8 (ref 5–15)
AST: 24 U/L (ref 15–41)
Alkaline Phosphatase: 62 U/L (ref 38–126)
BILIRUBIN TOTAL: 0.3 mg/dL (ref 0.3–1.2)
BUN: 5 mg/dL — ABNORMAL LOW (ref 6–20)
CO2: 23 mmol/L (ref 22–32)
Calcium: 9.1 mg/dL (ref 8.9–10.3)
Chloride: 103 mmol/L (ref 101–111)
Creatinine, Ser: 0.5 mg/dL (ref 0.44–1.00)
GFR calc Af Amer: >60 mL/min (ref >60)
GFR calc non Af Amer: >60 mL/min (ref >60)
Glucose, Bld: 145 mg/dL — ABNORMAL HIGH (ref 65–99)
POTASSIUM: 3.4 mmol/L — AB (ref 3.5–5.1)
Sodium: 134 mmol/L — ABNORMAL LOW (ref 135–145)
TOTAL PROTEIN: 7 g/dL (ref 6.5–8.1)

## 2016-10-16 MED ORDER — FAMOTIDINE IN NACL 20-0.9 MG/50ML-% IV SOLN
20.0000 mg | Freq: Once | INTRAVENOUS | Status: AC
  Administered 2016-10-16: 20 mg via INTRAVENOUS
  Filled 2016-10-16: qty 50

## 2016-10-16 MED ORDER — M.V.I. ADULT IV INJ
Freq: Once | INTRAVENOUS | Status: AC
  Administered 2016-10-16: 16:00:00 via INTRAVENOUS
  Filled 2016-10-16: qty 1000

## 2016-10-16 MED ORDER — MENTHOL 3 MG MT LOZG
1.0000 | LOZENGE | OROMUCOSAL | Status: DC | PRN
  Administered 2016-10-16: 3 mg via ORAL
  Filled 2016-10-16: qty 9

## 2016-10-16 NOTE — MAU Note (Signed)
Second call to Anesthesia regarding IV start. Had emergency and caused delay.

## 2016-10-16 NOTE — Progress Notes (Signed)
FHR difficult to monitor due to pt's movement and gestation. Trans adj. Crackers to pt. Tolerated ice chips earlier

## 2016-10-16 NOTE — Telephone Encounter (Signed)
Called and advised patient to go to [facility] for evaluation and possible IVF. Verbalized understanding.

## 2016-10-16 NOTE — Progress Notes (Signed)
Noni Saupe NP in to discuss d/c plan. Written and verbal d/c instructions given and understanding voiced.

## 2016-10-16 NOTE — Progress Notes (Signed)
efm d/ced by J Rasch Np.

## 2016-10-16 NOTE — MAU Provider Note (Signed)
History     CSN: [id number]  Arrival date and time: 10/16/16 1211   First Provider Initiated Contact with Patient 10/16/16 1400      Chief Complaint  Patient presents with  . Emesis  . Fatigue  . leg cramps   HPI   Charlotte Mccormick is a 31 y.o. female G2P1001 @ 43w4dhere in MAU with Nausea/Vomitng. She is taking Zofran at home: her last dose was this morning at 0900. She took Prilosec yesterday which she feels is not helping.  Says she has vomited 3 times in the last 24 hours. States she felt weak this morning and that's why she came in.   OB History    Gravida Para Term Preterm AB Living   '2 1 1     1   '$ SAB TAB Ectopic Multiple Live Births           1      Past Medical History:  Diagnosis Date  . Abnormal uterine bleeding (AUB) 12/12/2015  . Anemia   . Anxiety   . Asthma   . Constipation   . Depression   . Fibroid   . Genital herpes   . GERD (gastroesophageal reflux disease)   . History of chlamydia   . Hyperemesis   . Insomnia   . PTSD (post-traumatic stress disorder)   . Sinusitis   . Vaginal dryness 12/12/2015    Past Surgical History:  Procedure Laterality Date  . COLONOSCOPY N/A 04/08/2016   Procedure: COLONOSCOPY;  Surgeon: SDanie Binder MD;  [facility];  Service: Endoscopy;  Laterality: N/A;  10:15 AM  . ESOPHAGOGASTRODUODENOSCOPY N/A 04/08/2016   Procedure: ESOPHAGOGASTRODUODENOSCOPY (EGD);  Surgeon: SDanie Binder MD;  Location: [facility];  Service: Endoscopy;  Laterality: N/A;  . PERINEUM REPAIR      Family History  Problem Relation Age of Onset  . Heart disease Mother   . Hypertension Mother   . Hypertension Father   . Hyperlipidemia Father   . Diabetes Father   . Epilepsy Brother   . Eczema Daughter   . Colon cancer Neg Hx     Social History  Substance Use Topics  . Smoking status: Former Smoker    Packs/day: 0.50    Years: 5.00    Types: Cigarettes    Quit date: 06/03/2010  . Smokeless tobacco: Former  [employer]ystems developer . Alcohol use No     Comment: 03/19/16 currently no ETOH due to meds; Previously: Wine three times a week    Allergies:  Allergies  Allergen Reactions  . Bee Venom Other (See Comments)    Reaction:  Localized swelling   . Penicillins Itching and Other (See Comments)    Has patient had a PCN reaction causing immediate rash, facial/tongue/throat swelling, SOB or lightheadedness with hypotension: No Has patient had a PCN reaction causing severe rash involving mucus membranes or skin necrosis: No Has patient had a PCN reaction that required hospitalization No Has patient had a PCN reaction occurring within the last 10 years: Yes If all of the above answers are "NO", then may proceed with Cephalosporin use.   . Reglan [Metoclopramide]     Prescriptions Prior to Admission  Medication Sig Dispense Refill Last Dose  . acetaminophen (TYLENOL) 500 MG tablet Take 500 mg by mouth every 6 (six) hours as needed for mild pain or headache.   Past Week at Unknown time  . buPROPion (WELLBUTRIN XL) 300 MG 24 hr tablet Take 1 tablet (  300 mg total) by mouth daily. 30 tablet 1 10/15/2016 at Unknown time  . co-enzyme Q-10 30 MG capsule Take 100 mg by mouth as needed.    Past Week at Unknown time  . ferrous sulfate 325 (65 FE) MG EC tablet Take 65 mg by mouth 2 (two) times daily.    10/16/2016 at Unknown time  . magnesium oxide (MAG-OX) 400 MG tablet Take 400 mg by mouth as needed.    Past Week at Unknown time  . Melatonin 1 MG TABS Take 6 mg by mouth as needed.    10/15/2016 at Unknown time  . multivitamin (VIT W/EXTRA C) CHEW chewable tablet Chew 1 tablet by mouth at bedtime as needed and may repeat dose one time if needed.    10/15/2016 at Unknown time  . omeprazole (PRILOSEC) 20 MG capsule Take 1 capsule (20 mg total) by mouth 2 (two) times daily before a meal. 1 tablet a day 60 capsule 6 10/15/2016 at Unknown time  . ondansetron (ZOFRAN-ODT) 8 MG disintegrating tablet DISSOLVE 1 TABLET(8 MG) ON THE  TONGUE EVERY 8 HOURS AS NEEDED FOR NAUSEA OR VOMITING 20 tablet 0 10/16/2016 at Unknown time  . PROMETHEGAN 25 MG suppository UNWRAP AND INSERT 1 SUPPOSITORY(25 MG) RECTALLY EVERY 6 HOURS AS NEEDED FOR NAUSEA OR VOMITING 12 suppository 0 Past Week at Unknown time  . PROVENTIL HFA 108 (90 Base) MCG/ACT inhaler INHALE 2 PUFFS INTO THE LUNGS EVERY 6 HOURS AS NEEDED FOR WHEEZING OR SHORTNESS OF BREATH 6.7 g 2 10/16/2016 at Unknown time  . pyridOXINE (VITAMIN B-6) 100 MG tablet Take 50 mg by mouth 2 (two) times daily.    10/16/2016 at Unknown time  . Riboflavin (VITAMIN B-2 PO) Take by mouth as needed.    Past Week at Unknown time  . scopolamine (TRANSDERM-SCOP) 1 MG/3DAYS Place 1 patch (1.5 mg total) onto the skin every 3 (three) days. 10 patch 12 Taking   Results for orders placed or performed during the hospital encounter of 10/16/16 (from the past 48 hour(s))  Urinalysis, Routine w reflex microscopic     Status: Abnormal   Collection Time: 10/16/16 12:25 PM  Result Value Ref Range   Color, Urine YELLOW YELLOW   APPearance CLOUDY (A) CLEAR   Specific Gravity, Urine 1.020 1.005 - 1.030   pH 7.0 5.0 - 8.0   Glucose, UA NEGATIVE NEGATIVE mg/dL   Hgb urine dipstick NEGATIVE NEGATIVE   Bilirubin Urine NEGATIVE NEGATIVE   Ketones, ur NEGATIVE NEGATIVE mg/dL   Protein, ur 30 (A) NEGATIVE mg/dL   Nitrite NEGATIVE NEGATIVE   Leukocytes, UA TRACE (A) NEGATIVE   RBC / HPF 0-5 0 - 5 RBC/hpf   WBC, UA 6-30 0 - 5 WBC/hpf   Bacteria, UA NONE SEEN NONE SEEN   Squamous Epithelial / LPF 0-5 (A) NONE SEEN   Mucous PRESENT   Comprehensive metabolic panel     Status: Abnormal   Collection Time: 10/16/16  3:49 PM  Result Value Ref Range   Sodium 134 (L) 135 - 145 mmol/L   Potassium 3.4 (L) 3.5 - 5.1 mmol/L   Chloride 103 101 - 111 mmol/L   CO2 23 22 - 32 mmol/L   Glucose, Bld 145 (H) 65 - 99 mg/dL   BUN 5 (L) 6 - 20 mg/dL   Creatinine, Ser 0.50 0.44 - 1.00 mg/dL   Calcium 9.1 8.9 - 10.3 mg/dL   Total  Protein 7.0 6.5 - 8.1 g/dL   Albumin 3.2 (L) 3.5 -  5.0 g/dL   AST 24 15 - 41 U/L   ALT 14 14 - 54 U/L   Alkaline Phosphatase 62 38 - 126 U/L   Total Bilirubin 0.3 0.3 - 1.2 mg/dL   GFR calc non Af Amer >60 >60 mL/min   GFR calc Af Amer >60 >60 mL/min    Comment: (NOTE) The eGFR has been calculated using the CKD EPI equation. This calculation has not been validated in all clinical situations. eGFR's persistently <60 mL/min signify possible Chronic Kidney Disease.    Anion gap 8 5 - 15   Review of Systems  Constitutional: Positive for fatigue.  Gastrointestinal: Positive for nausea and vomiting.  Neurological: Positive for weakness.   Physical Exam   Blood pressure 112/68, pulse (!) 101, temperature 97.7 F (36.5 C), resp. rate 16, weight 157 lb (71.2 kg), last menstrual period 03/23/2016, SpO2 100 %.  Physical Exam  Constitutional: She is oriented to person, place, and time. She appears well-developed and well-nourished. No distress.  HENT:  Head: Normocephalic.  Eyes: Pupils are equal, round, and reactive to light.  GI: Soft. She exhibits no distension. There is no tenderness. There is no rebound.  Musculoskeletal: Normal range of motion.  Neurological: She is alert and oriented to person, place, and time.  Skin: Skin is warm. She is not diaphoretic.  Psychiatric: Her behavior is normal.   Fetal Tracing: Baseline: 130 bpm  Variability: Moderate  Accelerations: 15x15 Decelerations: None Toco: None  MAU Course  Procedures  None  MDM  Urine culture Pepcid 20 mg IV CMP D5LR with MVI X 1  Patient eating and drinking prior to DC.   Assessment and Plan   A:  1. Nausea and vomiting in pregnancy     P:  Discharge home in stable condition Rx: Zantac 150 BID  Small, frequent meals Return to MAU if symptoms worsen Follow up with OB as scheduled or sooner if needed Increase PO potassium at home.   Lezlie Lye, NP 10/16/2016 8:29 PM

## 2016-10-16 NOTE — MAU Note (Signed)
The dr's sent her over, thought she might be dehydrated. Has been throwing up the last couple days. Was feeling really weak this morning, better now.  Reports increased cramping in legs and feet. They tried to do sugar test, she couldn't hold it donw.

## 2016-10-16 NOTE — Discharge Instructions (Signed)
Nausea and Vomiting, Adult Feeling sick to your stomach (nausea) means that your stomach is upset or you feel like you have to throw up (vomit). Feeling more and more sick to your stomach can lead to throwing up. Throwing up happens when food and liquid from your stomach are thrown up and out the mouth. Throwing up can make you feel weak and cause you to get dehydrated. Dehydration can make you tired and thirsty, make you have a dry mouth, and make it so you pee (urinate) less often. Older adults and people with other diseases or a weak defense system (immune system) are at higher risk for dehydration. If you feel sick to your stomach or if you throw up, it is important to follow instructions from your doctor about how to take care of yourself. Follow these instructions at home: Eating and drinking  Follow these instructions as told by your doctor:  Take an oral rehydration solution (ORS). This is a drink that is sold at pharmacies and stores.  Drink clear fluids in small amounts as you are able, such as:  Water.  Ice chips.  Diluted fruit juice.  Low-calorie sports drinks.  Eat bland, easy-to-digest foods in small amounts as you are able, such as:  Bananas.  Applesauce.  Rice.  Low-fat (lean) meats.  Toast.  Crackers.  Avoid fluids that have a lot of sugar or caffeine in them.  Avoid alcohol.  Avoid spicy or fatty foods. General instructions   Drink enough fluid to keep your pee (urine) clear or pale yellow.  Wash your hands often. If you cannot use soap and water, use hand sanitizer.  Make sure that all people in your home wash their hands well and often.  Take over-the-counter and prescription medicines only as told by your doctor.  Rest at home while you get better.  Watch your condition for any changes.  Breathe slowly and deeply when you feel sick to your stomach.  Keep all follow-up visits as told by your doctor. This is important. Contact a doctor  if:  You have a fever.  You cannot keep fluids down.  Your symptoms get worse.  You have new symptoms.  You feel sick to your stomach for more than two days.  You feel light-headed or dizzy.  You have a headache.  You have muscle cramps. Get help right away if:  You have pain in your chest, neck, arm, or jaw.  You feel very weak or you pass out (faint).  You throw up again and again.  You see blood in your throw-up.  Your throw-up looks like black coffee grounds.  You have bloody or black poop (stools) or poop that look like tar.  You have a very bad headache, a stiff neck, or both.  You have a rash.  You have very bad pain, cramping, or bloating in your belly (abdomen).  You have trouble breathing.  You are breathing very quickly.  Your heart is beating very quickly.  Your skin feels cold and clammy.  You feel confused.  You have pain when you pee.  You have signs of dehydration, such as:  Dark pee, hardly any pee, or no pee.  Cracked lips.  Dry mouth.  Sunken eyes.  Sleepiness.  Weakness. These symptoms may be an emergency. Do not wait to see if the symptoms will go away. Get medical help right away. Call your local emergency services (911 in the [place] Do not drive yourself to the hospital. This information   is not intended to replace advice given to you by your health care provider. Make sure you discuss any questions you have with your health care provider. Document Released: 11/06/2007 Document Revised: 12/08/2015 Document Reviewed: 01/24/2015 Elsevier Interactive Patient Education  2017 [employer]  

## 2016-10-17 LAB — CULTURE, OB URINE: Special Requests: NORMAL

## 2016-10-18 ENCOUNTER — Encounter: Payer: Self-pay | Admitting: *Deleted

## 2016-10-25 ENCOUNTER — Telehealth: Payer: Self-pay | Admitting: *Deleted

## 2016-10-25 NOTE — Telephone Encounter (Signed)
Pt called stating that her BP was 120/82. She thought that was high and needed BP medication. I advised pt that her BP was in normal range.

## 2016-10-29 ENCOUNTER — Ambulatory Visit (INDEPENDENT_AMBULATORY_CARE_PROVIDER_SITE_OTHER): Payer: Medicaid Other | Admitting: Obstetrics & Gynecology

## 2016-10-29 VITALS — BP 118/76 | HR 112 | Wt 160.0 lb

## 2016-10-29 DIAGNOSIS — Z331 Pregnant state, incidental: Secondary | ICD-10-CM

## 2016-10-29 DIAGNOSIS — Z1389 Encounter for screening for other disorder: Secondary | ICD-10-CM | POA: Diagnosis not present

## 2016-10-29 DIAGNOSIS — O9989 Other specified diseases and conditions complicating pregnancy, childbirth and the puerperium: Secondary | ICD-10-CM | POA: Diagnosis not present

## 2016-10-29 DIAGNOSIS — O210 Mild hyperemesis gravidarum: Secondary | ICD-10-CM | POA: Diagnosis not present

## 2016-10-29 DIAGNOSIS — O26893 Other specified pregnancy related conditions, third trimester: Secondary | ICD-10-CM

## 2016-10-29 DIAGNOSIS — Z3A31 31 weeks gestation of pregnancy: Secondary | ICD-10-CM

## 2016-10-29 DIAGNOSIS — Z3483 Encounter for supervision of other normal pregnancy, third trimester: Secondary | ICD-10-CM

## 2016-10-29 LAB — POCT URINALYSIS DIPSTICK
Glucose, UA: NEGATIVE
Leukocytes, UA: NEGATIVE
Nitrite, UA: NEGATIVE
PROTEIN UA: NEGATIVE
RBC UA: NEGATIVE

## 2016-10-29 NOTE — Progress Notes (Signed)
G2P1001 [redacted]w[redacted]d Estimated Date of Delivery: 12/28/16  Blood pressure 118/76, pulse (!) 112, weight 160 lb (72.6 kg), last menstrual period 03/23/2016.   BP weight and urine results all reviewed and noted.  Please refer to the obstetrical flow sheet for the fundal height and fetal heart rate documentation:  Patient reports good fetal movement, denies any bleeding and no rupture of membranes symptoms or regular contractions. Patient is without complaints. All questions were answered.  Orders Placed This Encounter  Procedures  . POCT urinalysis dipstick    Plan:  Continued routine obstetrical care, has gained 5 pounds, reinforced to eat 6 small meals a day Has only thrown up 1 time in the past week, not had an appointment or interaction in 13 days  Not going to repeat Glucola  Return in about 2 weeks (around 11/12/2016) for [place].

## 2016-10-31 ENCOUNTER — Telehealth: Payer: Self-pay | Admitting: *Deleted

## 2016-10-31 NOTE — Telephone Encounter (Signed)
LMOVM to return call regarding STD

## 2016-11-01 ENCOUNTER — Other Ambulatory Visit: Payer: Self-pay | Admitting: Obstetrics & Gynecology

## 2016-11-01 ENCOUNTER — Other Ambulatory Visit: Payer: Self-pay | Admitting: Women's Health

## 2016-11-04 ENCOUNTER — Encounter: Payer: Self-pay | Admitting: Obstetrics & Gynecology

## 2016-11-08 ENCOUNTER — Other Ambulatory Visit: Payer: Self-pay | Admitting: Obstetrics & Gynecology

## 2016-11-08 MED ORDER — ZOLPIDEM TARTRATE 10 MG PO TABS: 10.0000 mg | ORAL_TABLET | Freq: Every evening | ORAL | 3 refills | Status: DC | PRN

## 2016-11-09 ENCOUNTER — Inpatient Hospital Stay (HOSPITAL_COMMUNITY)
Admission: AD | Admit: 2016-11-09 | Discharge: 2016-11-09 | Disposition: A | Discharge status: Home / Self Care | Payer: Medicaid Other | Source: Ambulatory Visit | Attending: Obstetrics & Gynecology | Admitting: Obstetrics & Gynecology

## 2016-11-09 ENCOUNTER — Encounter (HOSPITAL_COMMUNITY): Payer: Self-pay

## 2016-11-09 DIAGNOSIS — O210 Mild hyperemesis gravidarum: Secondary | ICD-10-CM

## 2016-11-09 DIAGNOSIS — Z8249 Family history of ischemic heart disease and other diseases of the circulatory system: Secondary | ICD-10-CM | POA: Diagnosis not present

## 2016-11-09 DIAGNOSIS — Z3A33 33 weeks gestation of pregnancy: Secondary | ICD-10-CM | POA: Diagnosis not present

## 2016-11-09 DIAGNOSIS — Z888 Allergy status to other drugs, medicaments and biological substances status: Secondary | ICD-10-CM | POA: Diagnosis not present

## 2016-11-09 DIAGNOSIS — Z880 Allergy status to penicillin: Secondary | ICD-10-CM | POA: Diagnosis not present

## 2016-11-09 DIAGNOSIS — O99613 Diseases of the digestive system complicating pregnancy, third trimester: Secondary | ICD-10-CM | POA: Insufficient documentation

## 2016-11-09 DIAGNOSIS — Z820 Family history of epilepsy and other diseases of the nervous system: Secondary | ICD-10-CM | POA: Insufficient documentation

## 2016-11-09 DIAGNOSIS — R1110 Vomiting, unspecified: Secondary | ICD-10-CM

## 2016-11-09 DIAGNOSIS — O212 Late vomiting of pregnancy: Secondary | ICD-10-CM | POA: Insufficient documentation

## 2016-11-09 DIAGNOSIS — Z87891 Personal history of nicotine dependence: Secondary | ICD-10-CM | POA: Diagnosis not present

## 2016-11-09 DIAGNOSIS — K219 Gastro-esophageal reflux disease without esophagitis: Secondary | ICD-10-CM | POA: Diagnosis not present

## 2016-11-09 DIAGNOSIS — Z833 Family history of diabetes mellitus: Secondary | ICD-10-CM | POA: Insufficient documentation

## 2016-11-09 LAB — URINALYSIS, ROUTINE W REFLEX MICROSCOPIC
Bilirubin Urine: NEGATIVE
Glucose, UA: NEGATIVE mg/dL
HGB URINE DIPSTICK: NEGATIVE
Ketones, ur: NEGATIVE mg/dL
Nitrite: NEGATIVE
PROTEIN: 30 mg/dL — AB
Specific Gravity, Urine: 1.025 (ref 1.005–1.030)
pH: 7 (ref 5.0–8.0)

## 2016-11-09 MED ORDER — PANTOPRAZOLE SODIUM 40 MG PO TBEC
40.0000 mg | DELAYED_RELEASE_TABLET | Freq: Once | ORAL | Status: AC
  Administered 2016-11-09: 40 mg via ORAL
  Filled 2016-11-09: qty 1

## 2016-11-09 MED ORDER — ONDANSETRON 8 MG PO TBDP
8.0000 mg | ORAL_TABLET | Freq: Once | ORAL | Status: AC
  Administered 2016-11-09: 8 mg via ORAL
  Filled 2016-11-09: qty 1

## 2016-11-09 MED ORDER — HYDROXYZINE PAMOATE 50 MG PO CAPS: 50.0000 mg | ORAL_CAPSULE | Freq: Three times a day (TID) | ORAL | 0 refills | Status: DC | PRN

## 2016-11-09 MED ORDER — PANTOPRAZOLE SODIUM 20 MG PO TBEC: 20.0000 mg | DELAYED_RELEASE_TABLET | Freq: Two times a day (BID) | ORAL | 2 refills | Status: DC

## 2016-11-09 MED ORDER — HYDROXYZINE HCL 50 MG/ML IM SOLN
50.0000 mg | Freq: Once | INTRAMUSCULAR | Status: AC
  Administered 2016-11-09: 50 mg via INTRAMUSCULAR
  Filled 2016-11-09: qty 1

## 2016-11-09 NOTE — MAU Provider Note (Signed)
History   G2P1001 @ 33 wks in with nausea, vomiting and c/o GERD. When questioned regarding her meds pt takes her meds as she feels she needs them not as prescribed. Denies abd pain, ROM or vag bleeding.  CSN: [id number]  Arrival date & time 11/09/16  1210   None     Chief Complaint  Patient presents with  . Emesis    HPI  Past Medical History:  Diagnosis Date  . Abnormal uterine bleeding (AUB) 12/12/2015  . Anemia   . Anxiety   . Asthma   . Constipation   . Depression   . Fibroid   . Genital herpes   . GERD (gastroesophageal reflux disease)   . History of chlamydia   . Hyperemesis   . Insomnia   . PTSD (post-traumatic stress disorder)   . Sinusitis   . Vaginal dryness 12/12/2015    Past Surgical History:  Procedure Laterality Date  . COLONOSCOPY N/A 04/08/2016   Procedure: COLONOSCOPY;  Surgeon: Danie Binder, MD;  Location: [facility];  Service: Endoscopy;  Laterality: N/A;  10:15 AM  . ESOPHAGOGASTRODUODENOSCOPY N/A 04/08/2016   Procedure: ESOPHAGOGASTRODUODENOSCOPY (EGD);  Surgeon: Danie Binder, MD;  Location: [facility];  Service: Endoscopy;  Laterality: N/A;  . PERINEUM REPAIR      Family History  Problem Relation Age of Onset  . Heart disease Mother   . Hypertension Mother   . Hypertension Father   . Hyperlipidemia Father   . Diabetes Father   . Epilepsy Brother   . Eczema Daughter   . Colon cancer Neg Hx     Social History  Substance Use Topics  . Smoking status: Former Smoker    Packs/day: 0.50    Years: 5.00    Types: Cigarettes    Quit date: 06/03/2010  . Smokeless tobacco: Former Systems developer  . Alcohol use No     Comment: 03/19/16 currently no ETOH due to meds; Previously: Wine three times a week    OB History    Gravida Para Term Preterm AB Living   2 1 1     1    SAB TAB Ectopic Multiple Live Births           1      Review of Systems  Constitutional: Negative.   HENT: Negative.   Eyes: Negative.   Respiratory: Negative.    Cardiovascular: Negative.   Gastrointestinal: Positive for nausea and vomiting.  Endocrine: Negative.   Genitourinary: Negative.   Musculoskeletal: Negative.   Skin: Negative.     Allergies  Bee venom; Penicillins; and Reglan [metoclopramide]  Home Medications    BP 117/61 (BP Location: Right Arm)   Pulse 96   Temp 97.9 F (36.6 C) (Oral)   Resp 17   Ht 5\' 2"  (1.575 m)   Wt 157 lb 4 oz (71.3 kg)   LMP 03/23/2016   SpO2 99%   BMI 28.76 kg/m   Physical Exam  Constitutional: She is oriented to person, place, and time. She appears well-developed and well-nourished.  HENT:  Head: Normocephalic.  Eyes: Pupils are equal, round, and reactive to light.  Neck: Normal range of motion.  Cardiovascular: Normal rate, regular rhythm, normal heart sounds and intact distal pulses.   Pulmonary/Chest: Effort normal and breath sounds normal.  Abdominal: Soft. Bowel sounds are normal.  Genitourinary: Vagina normal and uterus normal.  Musculoskeletal: Normal range of motion.  Neurological: She is alert and oriented to person, place, and time. She has  normal reflexes.  Skin: Skin is warm and dry.  Psychiatric: She has a normal mood and affect. Her behavior is normal. Judgment and thought content normal.    MAU Course  Procedures (including critical care time)  Labs Reviewed  URINALYSIS, ROUTINE W REFLEX MICROSCOPIC - Abnormal; Notable for the following:       Result Value   APPearance HAZY (*)    Protein, ur 30 (*)    Leukocytes, UA SMALL (*)    Bacteria, UA RARE (*)    Squamous Epithelial / LPF 6-30 (*)    All other components within normal limits   No results found.   No diagnosis found.  hyperemesis  MDM  G2P1001 @ 33 wks in with nausea, vomiting and c/o GERD. When questioned regarding her meds pt takes her meds as she feels she needs them not as prescribed. Denies abd pain, ROM or vag bleeding.  VSS, Heart RRR, LCTAB, abd soft non tender. FHR pattern reassuring.  Discussed importance of taking meds as prescribed. U/a WNL  Will give vistaril, protonix and zofran.pt has not vomited since arrival on unit  Discussed importance of taking meds as prescribed. Will d/c home

## 2016-11-09 NOTE — Discharge Instructions (Signed)

## 2016-11-09 NOTE — MAU Note (Signed)
Pt c/o vomiting since Monday about 5-6 times per day. Pt states today she is feeling very weak and tired. Pt states baby is moving normally. Pt denies bleeding, leaking, and contractions. Pt denies diarrhea.

## 2016-11-12 ENCOUNTER — Encounter: Payer: Self-pay | Admitting: Obstetrics & Gynecology

## 2016-11-12 ENCOUNTER — Ambulatory Visit (INDEPENDENT_AMBULATORY_CARE_PROVIDER_SITE_OTHER): Payer: Medicaid Other | Admitting: Obstetrics & Gynecology

## 2016-11-12 VITALS — BP 116/74 | HR 130 | Wt 162.0 lb

## 2016-11-12 DIAGNOSIS — O09893 Supervision of other high risk pregnancies, third trimester: Secondary | ICD-10-CM | POA: Diagnosis not present

## 2016-11-12 DIAGNOSIS — O210 Mild hyperemesis gravidarum: Secondary | ICD-10-CM | POA: Diagnosis not present

## 2016-11-12 DIAGNOSIS — Z331 Pregnant state, incidental: Secondary | ICD-10-CM | POA: Diagnosis not present

## 2016-11-12 DIAGNOSIS — O9989 Other specified diseases and conditions complicating pregnancy, childbirth and the puerperium: Secondary | ICD-10-CM | POA: Diagnosis not present

## 2016-11-12 DIAGNOSIS — Z1389 Encounter for screening for other disorder: Secondary | ICD-10-CM

## 2016-11-12 DIAGNOSIS — O0993 Supervision of high risk pregnancy, unspecified, third trimester: Secondary | ICD-10-CM

## 2016-11-12 DIAGNOSIS — Z3A33 33 weeks gestation of pregnancy: Secondary | ICD-10-CM

## 2016-11-12 LAB — POCT URINALYSIS DIPSTICK
Blood, UA: NEGATIVE
Nitrite, UA: NEGATIVE
PROTEIN UA: NEGATIVE

## 2016-11-12 MED ORDER — ACYCLOVIR 400 MG PO TABS: 400.0000 mg | ORAL_TABLET | Freq: Three times a day (TID) | ORAL | 1 refills | Status: DC

## 2016-11-12 NOTE — Progress Notes (Signed)
Fetal Surveillance Testing today:  FHR 137   High Risk Pregnancy Diagnosis(es):   Hyperemesis Gravidarum  G2P1001 [redacted]w[redacted]d Estimated Date of Delivery: 12/28/16  Blood pressure 116/74, pulse (!) 130, weight 162 lb (73.5 kg), last menstrual period 03/23/2016.  Urinalysis: Negative   HPI: The patient is being seen today for ongoing management of as above. Today she reports continues to have intermittent emesis   BP weight and urine results all reviewed and noted. Patient reports good fetal movement, denies any bleeding and no rupture of membranes symptoms or regular contractions.  Fundal Height:  35 Fetal Heart rate:  137 Edema:  none  Patient is without complaints other than noted in her HPI. All questions were answered.  All lab and sonogram results have been reviewed. Comments:    Assessment:  1.  Pregnancy at [redacted]w[redacted]d,  Estimated Date of Delivery: 12/28/16 :                          2.  Hyperemesis Hravidarum                        3.    Medication(s) Plans:  none  Treatment Plan:  Sonogram 1 week for EFW  Return in about 2 weeks (around 11/26/2016) for EFW sonogram, , HROB. for appointment for high risk OB care  Meds ordered this encounter  Medications  . acyclovir (ZOVIRAX) 400 MG tablet    Sig: Take 1 tablet (400 mg total) by mouth 3 (three) times daily.    Dispense:  90 tablet    Refill:  1   Orders Placed This Encounter  Procedures  . US OB Follow Up  . POCT urinalysis dipstick

## 2016-11-15 ENCOUNTER — Other Ambulatory Visit: Payer: Self-pay | Admitting: Women's Health

## 2016-11-25 ENCOUNTER — Other Ambulatory Visit: Payer: Self-pay | Admitting: Women's Health

## 2016-11-28 ENCOUNTER — Ambulatory Visit (INDEPENDENT_AMBULATORY_CARE_PROVIDER_SITE_OTHER): Payer: Medicaid Other | Admitting: Obstetrics & Gynecology

## 2016-11-28 ENCOUNTER — Ambulatory Visit (INDEPENDENT_AMBULATORY_CARE_PROVIDER_SITE_OTHER): Payer: Medicaid Other

## 2016-11-28 ENCOUNTER — Encounter: Payer: Self-pay | Admitting: Obstetrics & Gynecology

## 2016-11-28 VITALS — BP 120/70 | HR 114

## 2016-11-28 DIAGNOSIS — O09893 Supervision of other high risk pregnancies, third trimester: Secondary | ICD-10-CM | POA: Diagnosis not present

## 2016-11-28 DIAGNOSIS — O2613 Low weight gain in pregnancy, third trimester: Secondary | ICD-10-CM

## 2016-11-28 DIAGNOSIS — O210 Mild hyperemesis gravidarum: Secondary | ICD-10-CM

## 2016-11-28 DIAGNOSIS — Z331 Pregnant state, incidental: Secondary | ICD-10-CM

## 2016-11-28 DIAGNOSIS — Z3A35 35 weeks gestation of pregnancy: Secondary | ICD-10-CM | POA: Diagnosis not present

## 2016-11-28 DIAGNOSIS — O0993 Supervision of high risk pregnancy, unspecified, third trimester: Secondary | ICD-10-CM

## 2016-11-28 DIAGNOSIS — Z1389 Encounter for screening for other disorder: Secondary | ICD-10-CM | POA: Diagnosis not present

## 2016-11-28 LAB — POCT URINALYSIS DIPSTICK
Glucose, UA: NEGATIVE
Ketones, UA: NEGATIVE
NITRITE UA: NEGATIVE
Protein, UA: NEGATIVE
RBC UA: NEGATIVE

## 2016-11-28 NOTE — Progress Notes (Signed)
[place] 03+5 wks,cephalic,ant pl gr 3,normal ov's bilat,fhr 137 bpm,afi 15.8 cm,EFW 2827 g 53%

## 2016-11-28 NOTE — Progress Notes (Signed)
Fetal Surveillance Testing today:  Sonogram is normal, terrific growth   High Risk Pregnancy Diagnosis(es):   Hyperemesis gravidarum with poor weight gain  G2P1001 [redacted]w[redacted]d Estimated Date of Delivery: 12/28/16  Blood pressure 120/70, pulse (!) 114, last menstrual period 03/23/2016.  Urinalysis: Negative   HPI: The patient is being seen today for ongoing management of as above. Today she reports has not thrown up in 2 days, only throwing up now twice a week   BP weight and urine results all reviewed and noted. Patient reports good fetal movement, denies any bleeding and no rupture of membranes symptoms or regular contractions.  Fundal Height:  36 Fetal Heart rate:  146 Edema:  none  Patient is without complaints other than noted in her HPI. All questions were answered.  All lab and sonogram results have been reviewed. Comments:    Assessment:  1.  Pregnancy at [redacted]w[redacted]d,  Estimated Date of Delivery: 12/28/16 :                          2.  Hyperemesis gravidarum                        3.    Medication(s) Plans:  Protonix, phenergan and zofran  Treatment Plan:  EFW is good no further sonograms are needed, weekly follow up no fetal surveillance needed at this point  Return in about 1 week (around 12/05/2016) for HROB. for appointment for high risk OB care  No orders of the defined types were placed in this encounter.  Orders Placed This Encounter  Procedures  . POCT urinalysis dipstick

## 2016-12-03 ENCOUNTER — Encounter: Payer: Self-pay | Admitting: Obstetrics & Gynecology

## 2016-12-04 ENCOUNTER — Encounter (HOSPITAL_COMMUNITY): Payer: Self-pay | Admitting: *Deleted

## 2016-12-04 ENCOUNTER — Emergency Department (HOSPITAL_COMMUNITY)
Admission: EM | Admit: 2016-12-04 | Discharge: 2016-12-04 | Disposition: A | Discharge status: Home / Self Care | Payer: Medicaid Other | Attending: Emergency Medicine | Admitting: Emergency Medicine

## 2016-12-04 ENCOUNTER — Other Ambulatory Visit: Payer: Self-pay

## 2016-12-04 ENCOUNTER — Emergency Department (HOSPITAL_COMMUNITY): Payer: Medicaid Other

## 2016-12-04 DIAGNOSIS — Z87891 Personal history of nicotine dependence: Secondary | ICD-10-CM | POA: Insufficient documentation

## 2016-12-04 DIAGNOSIS — Z79899 Other long term (current) drug therapy: Secondary | ICD-10-CM | POA: Diagnosis not present

## 2016-12-04 DIAGNOSIS — J45909 Unspecified asthma, uncomplicated: Secondary | ICD-10-CM | POA: Diagnosis not present

## 2016-12-04 DIAGNOSIS — R0789 Other chest pain: Secondary | ICD-10-CM | POA: Insufficient documentation

## 2016-12-04 DIAGNOSIS — Z7951 Long term (current) use of inhaled steroids: Secondary | ICD-10-CM | POA: Diagnosis not present

## 2016-12-04 DIAGNOSIS — R079 Chest pain, unspecified: Secondary | ICD-10-CM | POA: Diagnosis present

## 2016-12-04 LAB — I-STAT CHEM 8, ED
BUN: 4 mg/dL — AB (ref 6–20)
CALCIUM ION: 1.24 mmol/L (ref 1.15–1.40)
CHLORIDE: 107 mmol/L (ref 101–111)
CREATININE: 0.5 mg/dL (ref 0.44–1.00)
GLUCOSE: 85 mg/dL (ref 65–99)
HCT: 30 % — ABNORMAL LOW (ref 36.0–46.0)
Hemoglobin: 10.2 g/dL — ABNORMAL LOW (ref 12.0–15.0)
Potassium: 3.6 mmol/L (ref 3.5–5.1)
Sodium: 139 mmol/L (ref 135–145)
TCO2: 22 mmol/L (ref 0–100)

## 2016-12-04 LAB — URINALYSIS, ROUTINE W REFLEX MICROSCOPIC
Bilirubin Urine: NEGATIVE
GLUCOSE, UA: NEGATIVE mg/dL
Hgb urine dipstick: NEGATIVE
Ketones, ur: NEGATIVE mg/dL
Nitrite: NEGATIVE
PH: 7 (ref 5.0–8.0)
Protein, ur: NEGATIVE mg/dL
SPECIFIC GRAVITY, URINE: 1.015 (ref 1.005–1.030)

## 2016-12-04 LAB — I-STAT TROPONIN, ED: Troponin i, poc: 0 ng/mL (ref 0.00–0.08)

## 2016-12-04 LAB — URINALYSIS, MICROSCOPIC (REFLEX)
BACTERIA UA: NONE SEEN
RBC / HPF: NONE SEEN RBC/hpf (ref 0–5)

## 2016-12-04 MED ORDER — ALUM & MAG HYDROXIDE-SIMETH 200-200-20 MG/5ML PO SUSP
15.0000 mL | Freq: Once | ORAL | Status: AC
  Administered 2016-12-04: 15 mL via ORAL
  Filled 2016-12-04: qty 30

## 2016-12-04 NOTE — ED Notes (Signed)
Per [facility], ok to remove from fetal monitor

## 2016-12-04 NOTE — Progress Notes (Signed)
Fetal heart tones 135bpm, moderate variability, 15x15 accelerations, no decelerations; 2 contractions in 30 minutes. Charge RN [facility] advised.

## 2016-12-04 NOTE — Progress Notes (Signed)
Fetal hear tones 135bpm, moderate variability, 15x15 accelerations, no decelerations; occasional contractions (patient comfortable per [facility] RN).

## 2016-12-04 NOTE — Discharge Instructions (Signed)
Eat a bland diet, avoiding greasy, fatty, fried foods, as well as spicy and acidic foods or beverages.  Avoid eating within the hour or 2 before going to bed or laying down.  Also avoid teas, colas, coffee, chocolate, pepermint and spearment.  May also take over the counter maalox/mylanta, as directed on packaging, as needed for discomfort.  Take your usual prescriptions as previously directed.  Take over the counter tylenol, as directed on packaging, as needed for discomfort. Call your regular OB/GYN doctor tomorrow to schedule a follow up appointment in the next 2 days.  Return to the Emergency Department immediately if worsening.

## 2016-12-04 NOTE — ED Provider Notes (Signed)
[facility] DEPT Provider Note   CSN: [id number] Arrival date & time: 12/04/16  0117     History   Chief Complaint Chief Complaint  Patient presents with  . Chest Pain    HPI Charlotte Mccormick is a 31 y.o. female.   Chest Pain    Pt was seen at 0335. Per pt, c/o gradual onset and persistence of constant chest "pain" for the past 2 days. Describes the CP as "sore." Pain worsens with palpation of the area. Has been associated with right sided thoracic back "pain." Pt endorses hx of same, dx costochondritis and GERD. Hx G2P1, EDD 12/28/2016, EGA 36 4/7 weeks.  Denies abd pain, no LOF, no vaginal discharge/bleeding, no pelvic cramping, no low back pain, no SOB/cough, no palpitations, no injury.   Past Medical History:  Diagnosis Date  . Abnormal uterine bleeding (AUB) 12/12/2015  . Anemia   . Anxiety   . Asthma   . Constipation   . Depression   . Fibroid   . Genital herpes   . GERD (gastroesophageal reflux disease)   . History of chlamydia   . Hyperemesis   . Insomnia   . PTSD (post-traumatic stress disorder)   . Sinusitis   . Vaginal dryness 12/12/2015    Patient Active Problem List   Diagnosis Date Noted  . Fibroid 08/08/2016  . Hyperemesis affecting pregnancy, antepartum 06/06/2016  . Encounter for supervision of other normal pregnancy 05/22/2016  . PTSD (post-traumatic stress disorder) 05/01/2016  . Heme positive stool 03/19/2016  . Gastroesophageal reflux disease 01/16/2016  . Constipation 01/16/2016  . Black stools 01/16/2016  . Abnormal uterine bleeding (AUB) 12/12/2015    Past Surgical History:  Procedure Laterality Date  . COLONOSCOPY N/A 04/08/2016   Procedure: COLONOSCOPY;  Surgeon: Danie Binder, MD;  Location: [facility];  Service: Endoscopy;  Laterality: N/A;  10:15 AM  . ESOPHAGOGASTRODUODENOSCOPY N/A 04/08/2016   Procedure: ESOPHAGOGASTRODUODENOSCOPY (EGD);  Surgeon: Danie Binder, MD;  Location: [facility];  Service: Endoscopy;   Laterality: N/A;  . PERINEUM REPAIR      OB History    Gravida Para Term Preterm AB Living   2 1 1     1    SAB TAB Ectopic Multiple Live Births           1       Home Medications    Prior to Admission medications   Medication Sig Start Date End Date Taking? Authorizing Provider  acetaminophen (TYLENOL) 500 MG tablet Take 500 mg by mouth every 6 (six) hours as needed for mild pain or headache.    [provider]  acyclovir (ZOVIRAX) 400 MG tablet Take 1 tablet (400 mg total) by mouth 3 (three) times daily. 11/12/16   Florian Buff, MD  albuterol (PROVENTIL HFA;VENTOLIN HFA) 108 (90 Base) MCG/ACT inhaler Inhale 2 puffs into the lungs every 6 (six) hours as needed for wheezing or shortness of breath.    [provider]  buPROPion (WELLBUTRIN XL) 300 MG 24 hr tablet Take 300 mg by mouth daily.    [provider]  calcium carbonate (TUMS - DOSED IN MG ELEMENTAL CALCIUM) 500 MG chewable tablet Chew 2 tablets by mouth 4 (four) times daily as needed for indigestion or heartburn.    [provider]  co-enzyme Q-10 30 MG capsule Take 100 mg by mouth as needed (For migraine.).     [provider]  ferrous sulfate 325 (65 FE) MG EC tablet Take  650 mg by mouth daily with breakfast.     [provider]  hydrOXYzine (VISTARIL) 50 MG capsule Take 1 capsule (50 mg total) by mouth 3 (three) times daily as needed. Patient taking differently: Take 50 mg by mouth daily as needed.  11/09/16   Keitha Butte, CNM  magnesium oxide (MAG-OX) 400 MG tablet Take 400 mg by mouth as needed (For migraine.).     [provider]  Melatonin 1 MG TABS Take 6 mg by mouth as needed.     [provider]  multivitamin (VIT W/EXTRA C) CHEW chewable tablet Chew 1 tablet by mouth at bedtime as needed and may repeat dose one time if needed.     [provider]  omeprazole (PRILOSEC) 20 MG capsule Take 1 capsule (20 mg total) by mouth 2 (two) times  daily before a meal. 1 tablet a day Patient taking differently: Take 20 mg by mouth 2 (two) times daily before a meal.  09/16/16   Florian Buff, MD  ondansetron (ZOFRAN-ODT) 8 MG disintegrating tablet DISSOLVE 1 TABLET BY MOUTH EVERY 8 HOURS AS NEEDED FOR NAUSEA AND VOMITING 11/25/16   [name], CNM  pantoprazole (PROTONIX) 20 MG tablet Take 1 tablet (20 mg total) by mouth 2 (two) times daily. 11/09/16   Keitha Butte, CNM  promethazine (PHENERGAN) 25 MG suppository Place 25 mg rectally every 6 (six) hours as needed for nausea or vomiting.    [provider]  promethazine (PHENERGAN) 25 MG tablet Take 12.5-25 mg by mouth every 6 (six) hours as needed for nausea or vomiting.     [provider]  promethazine (PHENERGAN) 25 MG tablet TAKE 1/2 TO 1 TABLET BY MOUTH EVERY 6 HOURS AS NEEDED FOR NAUSEA AND VOMITING 11/15/16   [name], CNM  pyridOXINE (VITAMIN B-6) 100 MG tablet Take 50 mg by mouth 2 (two) times daily.     [provider]  Riboflavin (VITAMIN B-2 PO) Take 1 Dose by mouth as needed (For migraine.).     [provider]  scopolamine (TRANSDERM-SCOP) 1 MG/3DAYS Place 1 patch (1.5 mg total) onto the skin every 3 (three) days. 09/20/16   Donnamae Jude, MD  zolpidem (AMBIEN) 10 MG tablet Take 1 tablet (10 mg total) by mouth at bedtime as needed for sleep. Patient taking differently: Take 5 mg by mouth at bedtime as needed for sleep.  11/08/16 12/08/16  Florian Buff, MD    Family History Family History  Problem Relation Age of Onset  . Heart disease Mother   . Hypertension Mother   . Hypertension Father   . Hyperlipidemia Father   . Diabetes Father   . Epilepsy Brother   . Eczema Daughter   . Colon cancer Neg Hx     Social History Social History  Substance Use Topics  . Smoking status: Former Smoker    Packs/day: 0.50    Years: 5.00    Types: Cigarettes    Quit date: 06/03/2010  . Smokeless tobacco: Former Systems developer  . Alcohol use No      Comment: 03/19/16 currently no ETOH due to meds; Previously: Wine three times a week     Allergies   Bee venom; Penicillins; and Reglan [metoclopramide]   Review of Systems Review of Systems  Cardiovascular: Positive for chest pain.  ROS: Statement: All systems negative except as marked or noted in the HPI; Constitutional: Negative for fever and chills. ; ; Eyes: Negative for eye  pain, redness and discharge. ; ; ENMT: Negative for ear pain, hoarseness, nasal congestion, sinus pressure and sore throat. ; ; Cardiovascular: Negative for palpitations, diaphoresis, dyspnea and peripheral edema. ; ; Respiratory: Negative for cough, wheezing and stridor. ; ; Gastrointestinal: Negative for nausea, vomiting, diarrhea, abdominal pain, blood in stool, hematemesis, jaundice and rectal bleeding. . ; ; Genitourinary: Negative for dysuria, flank pain and hematuria. ; ; Musculoskeletal: +CP, back pain. Negative for neck pain. Negative for swelling and trauma.; ; Skin: Negative for pruritus, rash, abrasions, blisters, bruising and skin lesion.; ; Neuro: Negative for headache, lightheadedness and neck stiffness. Negative for weakness, altered level of consciousness, altered mental status, extremity weakness, paresthesias, involuntary movement, seizure and syncope.        Physical Exam Updated Vital Signs BP 114/79 (BP Location: Left Arm)   Pulse (!) 108   Temp 98.3 F (36.8 C) (Oral)   Ht 5\' 2"  (1.575 m)   Wt 73 kg (161 lb)   LMP 03/23/2016   SpO2 100%   BMI 29.45 kg/m   Physical Exam 0340: Physical examination:  Nursing notes reviewed; Vital signs and O2 SAT reviewed;  Constitutional: Well developed, Well nourished, Well hydrated, In no acute distress; Head:  Normocephalic, atraumatic; Eyes: EOMI, PERRL, No scleral icterus; ENMT: Mouth and pharynx normal, Mucous membranes moist; Neck: Supple, Full range of motion, No lymphadenopathy; Cardiovascular: HR 90's during my exam. Regular rate and  rhythm, No gallop; Respiratory: Breath sounds clear & equal bilaterally, No wheezes.  Speaking full sentences with ease, Normal respiratory effort/excursion; Chest: +bilat parasternal areas tender to palp. No rash, no deformity, no soft tissue crepitus. Movement normal; Abdomen: Soft, Nontender, Nondistended, Normal bowel sounds; Genitourinary: No CVA tenderness; Spine:  No midline CS, TS, LS tenderness.  +TTP right thoracic paraspinal muscles. No rash.;; Extremities: Pulses normal, No tenderness, No edema, No calf edema or asymmetry.; Neuro: AA&Ox3, Major CN grossly intact.  Speech clear. No gross focal motor or sensory deficits in extremities.; Skin: Color normal, Warm, Dry.   ED Treatments / Results  Labs (all labs ordered are listed, but only abnormal results are displayed)   EKG  EKG Interpretation  Date/Time:  Wednesday December 04 2016 02:40:41 EDT Ventricular Rate:  107 PR Interval:    QRS Duration: 73 QT Interval:  324 QTC Calculation: 433 R Axis:   98 Text Interpretation:  Sinus tachycardia Borderline right axis deviation Borderline T abnormalities, inferior leads When compared with ECG of 07/19/2016 No significant change was found Confirmed by [facility]  MD, Nunzio Cory [phone]) on 12/04/2016 3:42:46 AM       Radiology   Procedures Procedures (including critical care time)  Medications Ordered in ED Medications  alum & mag hydroxide-simeth (MAALOX/MYLANTA) 200-200-20 MG/5ML suspension 15 mL (not administered)     Initial Impression / Assessment and Plan / ED Course  I have reviewed the triage vital signs and the nursing notes.  Pertinent labs & imaging results that were available during my care of the patient were reviewed by me and considered in my medical decision making (see chart for details).  MDM Reviewed: previous chart, nursing note and vitals Reviewed previous: labs and ECG Interpretation: labs, ECG and x-ray   Results for orders placed or performed during the  hospital encounter of 12/04/16  Urinalysis, Routine w reflex microscopic  Result Value Ref Range   Color, Urine YELLOW YELLOW   APPearance CLEAR CLEAR   Specific Gravity, Urine 1.015 1.005 - 1.030   pH 7.0 5.0 - 8.0  Glucose, UA NEGATIVE NEGATIVE mg/dL   Hgb urine dipstick NEGATIVE NEGATIVE   Bilirubin Urine NEGATIVE NEGATIVE   Ketones, ur NEGATIVE NEGATIVE mg/dL   Protein, ur NEGATIVE NEGATIVE mg/dL   Nitrite NEGATIVE NEGATIVE   Leukocytes, UA TRACE (A) NEGATIVE  Urinalysis, Microscopic (reflex)  Result Value Ref Range   RBC / HPF NONE SEEN 0 - 5 RBC/hpf   WBC, UA 0-5 0 - 5 WBC/hpf   Bacteria, UA NONE SEEN NONE SEEN   Squamous Epithelial / LPF 0-5 (A) NONE SEEN  I-stat Chem 8, ED  Result Value Ref Range   Sodium 139 135 - 145 mmol/L   Potassium 3.6 3.5 - 5.1 mmol/L   Chloride 107 101 - 111 mmol/L   BUN 4 (L) 6 - 20 mg/dL   Creatinine, Ser 0.50 0.44 - 1.00 mg/dL   Glucose, Bld 85 65 - 99 mg/dL   Calcium, Ion 1.24 1.15 - 1.40 mmol/L   TCO2 22 0 - 100 mmol/L   Hemoglobin 10.2 (L) 12.0 - 15.0 g/dL   HCT 30.0 (L) 36.0 - 46.0 %  I-stat troponin, ED  Result Value Ref Range   Troponin i, poc 0.00 0.00 - 0.08 ng/mL   Comment 3           Dg Chest 2 View Result Date: 12/04/2016 CLINICAL DATA:  Acute onset of generalized chest pain, radiating to the back. Initial encounter. EXAM: CHEST  2 VIEW COMPARISON:  None. FINDINGS: The lungs are well-aerated and clear. There is no evidence of focal opacification, pleural effusion or pneumothorax. The heart is borderline normal in size. No acute osseous abnormalities are seen. IMPRESSION: No acute cardiopulmonary process seen. Electronically Signed   By: Garald Balding M.D.   On: 12/04/2016 04:49    0555:  Workup reassuring. Pt has slept for the majority of her ED visit, NAD, resps easy.  Feels better after maalox and is ready to go home now. Has tol PO well without N/V. FHM/toco reassuring per [facility] OB staff. Continue to tx  symptomatically at this time. Dx and testing d/w pt.  Questions answered.  Verb understanding, agreeable to d/c home with outpt f/u.   Final Clinical Impressions(s) / ED Diagnoses   Final diagnoses:  None    New Prescriptions New Prescriptions   No medications on file      Francine Graven, DO 12/08/16 1113

## 2016-12-04 NOTE — ED Triage Notes (Signed)
Pt c/o chest pain that started yesterday and radiates around to her back

## 2016-12-05 ENCOUNTER — Encounter: Payer: Self-pay | Admitting: Obstetrics & Gynecology

## 2016-12-05 ENCOUNTER — Ambulatory Visit (INDEPENDENT_AMBULATORY_CARE_PROVIDER_SITE_OTHER): Payer: Medicaid Other | Admitting: Obstetrics & Gynecology

## 2016-12-05 VITALS — BP 112/64 | HR 108 | Wt 162.0 lb

## 2016-12-05 DIAGNOSIS — Z3A36 36 weeks gestation of pregnancy: Secondary | ICD-10-CM | POA: Diagnosis not present

## 2016-12-05 DIAGNOSIS — O09893 Supervision of other high risk pregnancies, third trimester: Secondary | ICD-10-CM | POA: Diagnosis not present

## 2016-12-05 DIAGNOSIS — Z331 Pregnant state, incidental: Secondary | ICD-10-CM | POA: Diagnosis not present

## 2016-12-05 DIAGNOSIS — O2693 Pregnancy related conditions, unspecified, third trimester: Secondary | ICD-10-CM

## 2016-12-05 DIAGNOSIS — O210 Mild hyperemesis gravidarum: Secondary | ICD-10-CM

## 2016-12-05 DIAGNOSIS — Z1389 Encounter for screening for other disorder: Secondary | ICD-10-CM | POA: Diagnosis not present

## 2016-12-05 DIAGNOSIS — Z3483 Encounter for supervision of other normal pregnancy, third trimester: Secondary | ICD-10-CM

## 2016-12-05 LAB — POCT URINALYSIS DIPSTICK
Blood, UA: NEGATIVE
Glucose, UA: NEGATIVE
Nitrite, UA: NEGATIVE
Protein, UA: NEGATIVE

## 2016-12-05 NOTE — Progress Notes (Signed)
G2P1001 [redacted]w[redacted]d Estimated Date of Delivery: 12/28/16  Blood pressure 112/64, pulse (!) 108, weight 162 lb (73.5 kg), last menstrual period 03/23/2016.   BP weight and urine results all reviewed and noted.  Please refer to the obstetrical flow sheet for the fundal height and fetal heart rate documentation:  Patient reports good fetal movement, denies any bleeding and no rupture of membranes symptoms or regular contractions. Patient is without complaints. All questions were answered.  Orders Placed This Encounter  Procedures  . GC/Chlamydia Probe Amp  . Culture, beta strep (group b only)  . POCT urinalysis dipstick    Plan:  Continued routine obstetrical care,  cx LTC GBS done  Return in about 1 week (around 12/12/2016) for LROB.

## 2016-12-06 LAB — URINE CULTURE

## 2016-12-07 LAB — GC/CHLAMYDIA PROBE AMP
CHLAMYDIA, DNA PROBE: NEGATIVE
Neisseria gonorrhoeae by PCR: NEGATIVE

## 2016-12-08 LAB — CULTURE, BETA STREP (GROUP B ONLY): STREP GP B CULTURE: NEGATIVE

## 2016-12-09 ENCOUNTER — Other Ambulatory Visit: Payer: Self-pay | Admitting: Obstetrics & Gynecology

## 2016-12-09 ENCOUNTER — Other Ambulatory Visit: Payer: Self-pay | Admitting: Women's Health

## 2016-12-09 ENCOUNTER — Other Ambulatory Visit: Payer: Self-pay | Admitting: Advanced Practice Midwife

## 2016-12-12 ENCOUNTER — Encounter: Payer: Self-pay | Admitting: Obstetrics & Gynecology

## 2016-12-12 ENCOUNTER — Ambulatory Visit (INDEPENDENT_AMBULATORY_CARE_PROVIDER_SITE_OTHER): Payer: Medicaid Other | Admitting: Obstetrics & Gynecology

## 2016-12-12 VITALS — BP 104/62 | HR 103 | Wt 164.0 lb

## 2016-12-12 DIAGNOSIS — Z3483 Encounter for supervision of other normal pregnancy, third trimester: Secondary | ICD-10-CM

## 2016-12-12 DIAGNOSIS — Z1389 Encounter for screening for other disorder: Secondary | ICD-10-CM

## 2016-12-12 DIAGNOSIS — O2693 Pregnancy related conditions, unspecified, third trimester: Secondary | ICD-10-CM | POA: Diagnosis not present

## 2016-12-12 DIAGNOSIS — O09893 Supervision of other high risk pregnancies, third trimester: Secondary | ICD-10-CM | POA: Diagnosis not present

## 2016-12-12 DIAGNOSIS — Z331 Pregnant state, incidental: Secondary | ICD-10-CM | POA: Diagnosis not present

## 2016-12-12 DIAGNOSIS — Z3A37 37 weeks gestation of pregnancy: Secondary | ICD-10-CM | POA: Diagnosis not present

## 2016-12-12 LAB — POCT URINALYSIS DIPSTICK
Glucose, UA: NEGATIVE
Ketones, UA: NEGATIVE
Nitrite, UA: NEGATIVE
RBC UA: NEGATIVE

## 2016-12-12 NOTE — Progress Notes (Signed)
G2P1001 [redacted]w[redacted]d Estimated Date of Delivery: 12/28/16  Blood pressure 104/62, pulse (!) 103, weight 164 lb (74.4 kg), last menstrual period 03/23/2016.   BP weight and urine results all reviewed and noted.  Please refer to the obstetrical flow sheet for the fundal height and fetal heart rate documentation:  Patient reports good fetal movement, denies any bleeding and no rupture of membranes symptoms or regular contractions. Patient is without complaints. All questions were answered.  Orders Placed This Encounter  Procedures  . POCT urinalysis dipstick    Plan:  Continued routine obstetrical care, no nausea and vomiting, feeling pretty good overall for her,   Pt wants to sign BTL papers today, knows it will be an interval tubal  Return in about 1 week (around 12/19/2016) for LROB.

## 2016-12-14 ENCOUNTER — Encounter (HOSPITAL_COMMUNITY): Payer: Self-pay

## 2016-12-14 ENCOUNTER — Inpatient Hospital Stay (EMERGENCY_DEPARTMENT_HOSPITAL)
Admission: AD | Admit: 2016-12-14 | Discharge: 2016-12-15 | Disposition: A | Discharge status: Home / Self Care | Payer: Medicaid Other | Source: Ambulatory Visit | Attending: Obstetrics and Gynecology | Admitting: Obstetrics and Gynecology

## 2016-12-14 DIAGNOSIS — M549 Dorsalgia, unspecified: Secondary | ICD-10-CM | POA: Diagnosis not present

## 2016-12-14 DIAGNOSIS — O26893 Other specified pregnancy related conditions, third trimester: Secondary | ICD-10-CM | POA: Diagnosis not present

## 2016-12-14 DIAGNOSIS — Z3A38 38 weeks gestation of pregnancy: Secondary | ICD-10-CM | POA: Diagnosis not present

## 2016-12-14 DIAGNOSIS — O9989 Other specified diseases and conditions complicating pregnancy, childbirth and the puerperium: Secondary | ICD-10-CM

## 2016-12-14 DIAGNOSIS — Z3689 Encounter for other specified antenatal screening: Secondary | ICD-10-CM | POA: Diagnosis not present

## 2016-12-14 DIAGNOSIS — O99891 Other specified diseases and conditions complicating pregnancy: Secondary | ICD-10-CM

## 2016-12-14 DIAGNOSIS — O368130 Decreased fetal movements, third trimester, not applicable or unspecified: Secondary | ICD-10-CM | POA: Diagnosis not present

## 2016-12-14 LAB — URINALYSIS, ROUTINE W REFLEX MICROSCOPIC
BACTERIA UA: NONE SEEN
BILIRUBIN URINE: NEGATIVE
Glucose, UA: NEGATIVE mg/dL
HGB URINE DIPSTICK: NEGATIVE
KETONES UR: NEGATIVE mg/dL
NITRITE: NEGATIVE
PROTEIN: 30 mg/dL — AB
RBC / HPF: NONE SEEN RBC/hpf (ref 0–5)
Specific Gravity, Urine: 1.017 (ref 1.005–1.030)
pH: 7 (ref 5.0–8.0)

## 2016-12-14 MED ORDER — CYCLOBENZAPRINE HCL 10 MG PO TABS
10.0000 mg | ORAL_TABLET | Freq: Three times a day (TID) | ORAL | Status: DC | PRN
  Administered 2016-12-14: 10 mg via ORAL
  Filled 2016-12-14: qty 1

## 2016-12-14 MED ORDER — ACETAMINOPHEN 500 MG PO TABS
1000.0000 mg | ORAL_TABLET | Freq: Once | ORAL | Status: AC
  Administered 2016-12-14: 1000 mg via ORAL
  Filled 2016-12-14: qty 2

## 2016-12-14 MED ORDER — FAMOTIDINE 20 MG PO TABS
10.0000 mg | ORAL_TABLET | Freq: Every day | ORAL | Status: DC
  Administered 2016-12-14: 10 mg via ORAL
  Filled 2016-12-14: qty 1

## 2016-12-14 MED ORDER — ONDANSETRON 8 MG PO TBDP
8.0000 mg | ORAL_TABLET | Freq: Once | ORAL | Status: AC
  Administered 2016-12-14: 8 mg via ORAL
  Filled 2016-12-14: qty 1

## 2016-12-14 NOTE — Discharge Instructions (Signed)
Fetal Movement Counts Patient Name: ________________________________________________ Patient Due Date: ____________________ What is a fetal movement count? A fetal movement count is the number of times that you feel your baby move during a certain amount of time. This may also be called a fetal kick count. A fetal movement count is recommended for every pregnant woman. You may be asked to start counting fetal movements as early as week 28 of your pregnancy. Pay attention to when your baby is most active. You may notice your baby's sleep and wake cycles. You may also notice things that make your baby move more. You should do a fetal movement count:  When your baby is normally most active.  At the same time each day.  A good time to count movements is while you are resting, after having something to eat and drink. How do I count fetal movements? 1. Find a quiet, comfortable area. Sit, or lie down on your side. 2. Write down the date, the start time and stop time, and the number of movements that you felt between those two times. Take this information with you to your health care visits. 3. For 2 hours, count kicks, flutters, swishes, rolls, and jabs. You should feel at least 10 movements during 2 hours. 4. You may stop counting after you have felt 10 movements. 5. If you do not feel 10 movements in 2 hours, have something to eat and drink. Then, keep resting and counting for 1 hour. If you feel at least 4 movements during that hour, you may stop counting. Contact a health care provider if:  You feel fewer than 4 movements in 2 hours.  Your baby is not moving like he or she usually does. Date: ____________ Start time: ____________ Stop time: ____________ Movements: ____________ Date: ____________ Start time: ____________ Stop time: ____________ Movements: ____________ Date: ____________ Start time: ____________ Stop time: ____________ Movements: ____________ Date: ____________ Start time:  ____________ Stop time: ____________ Movements: ____________ Date: ____________ Start time: ____________ Stop time: ____________ Movements: ____________ Date: ____________ Start time: ____________ Stop time: ____________ Movements: ____________ Date: ____________ Start time: ____________ Stop time: ____________ Movements: ____________ Date: ____________ Start time: ____________ Stop time: ____________ Movements: ____________ Date: ____________ Start time: ____________ Stop time: ____________ Movements: ____________ This information is not intended to replace advice given to you by your health care provider. Make sure you discuss any questions you have with your health care provider. Document Released: 06/19/2006 Document Revised: 01/17/2016 Document Reviewed: 06/29/2015 Elsevier Interactive Patient Education  2018 [place]. Back Pain in Pregnancy Back pain during pregnancy is common. Back pain may be caused by several factors that are related to changes during your pregnancy. Follow these instructions at home: Managing pain, stiffness, and swelling  If directed, apply ice for sudden (acute) back pain. ? Put ice in a plastic bag. ? Place a towel between your skin and the bag. ? Leave the ice on for 20 minutes, 2-3 times per day.  If directed, apply heat to the affected area before you exercise: ? Place a towel between your skin and the heat pack or heating pad. ? Leave the heat on for 20-30 minutes. ? Remove the heat if your skin turns bright red. This is especially important if you are unable to feel pain, heat, or cold. You may have a greater risk of getting burned. Activity  Exercise as told by your health care provider. Exercising is the best way to prevent or manage back pain.  Listen to your body when  lifting. If lifting hurts, ask for help or bend your knees. This uses your leg muscles instead of your back muscles.  Squat down when picking up something from the floor. Do not  bend over.  Only use bed rest as told by your health care provider. Bed rest should only be used for the most severe episodes of back pain. Standing, Sitting, and Lying Down  Do not stand in one place for long periods of time.  Use good posture when sitting. Make sure your head rests over your shoulders and is not hanging forward. Use a pillow on your lower back if necessary.  Try sleeping on your side, preferably the left side, with a pillow or two between your legs. If you are sore after a night's rest, your bed may be too soft. A firm mattress may provide more support for your back during pregnancy. General instructions  Do not wear high heels.  Eat a healthy diet. Try to gain weight within your health care provider's recommendations.  Use a maternity girdle, elastic sling, or back brace as told by your health care provider.  Take over-the-counter and prescription medicines only as told by your health care provider.  Keep all follow-up visits as told by your health care provider. This is important. This includes any visits with any specialists, such as a physical therapist. Contact a health care provider if:  Your back pain interferes with your daily activities.  You have increasing pain in other parts of your body. Get help right away if:  You develop numbness, tingling, weakness, or problems with the use of your arms or legs.  You develop severe back pain that is not controlled with medicine.  You have a sudden change in bowel or bladder control.  You develop shortness of breath, dizziness, or you faint.  You develop nausea, vomiting, or sweating.  You have back pain that is a rhythmic, cramping pain similar to labor pains. Labor pain is usually 1-2 minutes apart, lasts for about 1 minute, and involves a bearing down feeling or pressure in your pelvis.  You have back pain and your water breaks or you have vaginal bleeding.  You have back pain or numbness that travels  down your leg.  Your back pain developed after you fell.  You develop pain on one side of your back.  You see blood in your urine.  You develop skin blisters in the area of your back pain. This information is not intended to replace advice given to you by your health care provider. Make sure you discuss any questions you have with your health care provider. Document Released: 08/28/2005 Document Revised: 10/26/2015 Document Reviewed: 02/01/2015 Elsevier Interactive Patient Education  2018 [employer]. [employer] of the uterus can occur throughout pregnancy, but they are not always a sign that you are in labor. You may have practice contractions called Braxton Hicks contractions. These false labor contractions are sometimes confused with true labor. What are [place] contractions? Braxton Hicks contractions are tightening movements that occur in the muscles of the uterus before labor. Unlike true labor contractions, these contractions do not result in opening (dilation) and thinning of the cervix. Toward the end of pregnancy (32-34 weeks), Braxton Hicks contractions can happen more often and may become stronger. These contractions are sometimes difficult to tell apart from true labor because they can be very uncomfortable. You should not feel embarrassed if you go to the hospital with false labor. Sometimes, the only way  to tell if you are in true labor is for your health care provider to look for changes in the cervix. The health care provider will do a physical exam and may monitor your contractions. If you are not in true labor, the exam should show that your cervix is not dilating and your water has not broken. If there are no prenatal problems or other health problems associated with your pregnancy, it is completely safe for you to be sent home with false labor. You may continue to have Braxton Hicks contractions until you go into true labor. How can I tell  the difference between true labor and false labor?  Differences ? False labor ? Contractions last 30-70 seconds.: Contractions are usually shorter and not as strong as true labor contractions. ? Contractions become very regular.: Contractions are usually irregular. ? Discomfort is usually felt in the top of the uterus, and it spreads to the lower abdomen and low back.: Contractions are often felt in the front of the lower abdomen and in the groin. ? Contractions do not go away with walking.: Contractions may go away when you walk around or change positions while lying down. ? Contractions usually become more intense and increase in frequency.: Contractions get weaker and are shorter-lasting as time goes on. ? The cervix dilates and gets thinner.: The cervix usually does not dilate or become thin. Follow these instructions at home:  Take over-the-counter and prescription medicines only as told by your health care provider.  Keep up with your usual exercises and follow other instructions from your health care provider.  Eat and drink lightly if you think you are going into labor.  If Braxton Hicks contractions are making you uncomfortable: ? Change your position from lying down or resting to walking, or change from walking to resting. ? Sit and rest in a tub of warm water. ? Drink enough fluid to keep your urine clear or pale yellow. Dehydration may cause these contractions. ? Do slow and deep breathing several times an hour.  Keep all follow-up prenatal visits as told by your health care provider. This is important. Contact a health care provider if:  You have a fever.  You have continuous pain in your abdomen. Get help right away if:  Your contractions become stronger, more regular, and closer together.  You have fluid leaking or gushing from your vagina.  You pass blood-tinged mucus (bloody show).  You have bleeding from your vagina.  You have low back pain that you never had  before.  You feel your babys head pushing down and causing pelvic pressure.  Your baby is not moving inside you as much as it used to. Summary  Contractions that occur before labor are called Braxton Hicks contractions, false labor, or practice contractions.  Braxton Hicks contractions are usually shorter, weaker, farther apart, and less regular than true labor contractions. True labor contractions usually become progressively stronger and regular and they become more frequent.  Manage discomfort from [place] contractions by changing position, resting in a warm bath, drinking plenty of water, or practicing deep breathing. This information is not intended to replace advice given to you by your health care provider. Make sure you discuss any questions you have with your health care provider. Document Released: 05/20/2005 Document Revised: 04/08/2016 Document Reviewed: 04/08/2016 Elsevier Interactive Patient Education  2017 [employer].

## 2016-12-14 NOTE — [facility] Note (Signed)
Was told to come in for blurred vision and headache.  Hasn't felt baby move since early this morning.  Also having contractions every 15-20 mins and back pain.

## 2016-12-14 NOTE — MAU Provider Note (Signed)
History     CSN: [id number]  Arrival date and time: 12/14/16 2054   First Provider Initiated Contact with Patient 12/14/16 2143      Chief Complaint  Patient presents with  . Decreased Fetal Movement  . Contractions   HPI Charlotte Mccormick is a 31 y.o. G2P1001 at [redacted]w[redacted]d who presents with decreased fetal movement since this morning. She states she has not felt the baby move at all since around 9am. She also reports contractions and back pain that started last night and are now every 15 minutes. She rates the pain a 10/10 and tried tylenol with some relief. She also reports blurry vision for the last few days. She states this happened before in the pregnancy and her dr told her it was normal.  OB History    Gravida Para Term Preterm AB Living   2 1 1     1    SAB TAB Ectopic Multiple Live Births           1      Past Medical History:  Diagnosis Date  . Abnormal uterine bleeding (AUB) 12/12/2015  . Anemia   . Anxiety   . Asthma   . Constipation   . Depression   . Fibroid   . Genital herpes   . GERD (gastroesophageal reflux disease)   . History of chlamydia   . Hyperemesis   . Insomnia   . PTSD (post-traumatic stress disorder)   . Sinusitis   . Vaginal dryness 12/12/2015    Past Surgical History:  Procedure Laterality Date  . COLONOSCOPY N/A 04/08/2016   Procedure: COLONOSCOPY;  Surgeon: Danie Binder, MD;  Location: [facility];  Service: Endoscopy;  Laterality: N/A;  10:15 AM  . ESOPHAGOGASTRODUODENOSCOPY N/A 04/08/2016   Procedure: ESOPHAGOGASTRODUODENOSCOPY (EGD);  Surgeon: Danie Binder, MD;  Location: [facility];  Service: Endoscopy;  Laterality: N/A;  . PERINEUM REPAIR      Family History  Problem Relation Age of Onset  . Heart disease Mother   . Hypertension Mother   . Hypertension Father   . Hyperlipidemia Father   . Diabetes Father   . Epilepsy Brother   . Eczema Daughter   . Colon cancer Neg Hx     Social History  Substance Use Topics   . Smoking status: Former Smoker    Packs/day: 0.50    Years: 5.00    Types: Cigarettes    Quit date: 06/03/2010  . Smokeless tobacco: Former Systems developer  . Alcohol use No     Comment: 03/19/16 currently no ETOH due to meds; Previously: Wine three times a week    Allergies:  Allergies  Allergen Reactions  . Bee Venom Other (See Comments)    Reaction:  Localized swelling   . Penicillins Itching and Other (See Comments)    Has patient had a PCN reaction causing immediate rash, facial/tongue/throat swelling, SOB or lightheadedness with hypotension: No Has patient had a PCN reaction causing severe rash involving mucus membranes or skin necrosis: No Has patient had a PCN reaction that required hospitalization No Has patient had a PCN reaction occurring within the last 10 years: Yes If all of the above answers are "NO", then may proceed with Cephalosporin use.   . Reglan [Metoclopramide] Anxiety    Prescriptions Prior to Admission  Medication Sig Dispense Refill Last Dose  . acetaminophen (TYLENOL) 500 MG tablet Take 500 mg by mouth every 6 (six) hours as needed for mild pain or headache.  12/14/2016 at Unknown time  . acyclovir (ZOVIRAX) 400 MG tablet TAKE 1 TABLET BY MOUTH THREE TIMES DAILY 90 tablet 0 12/14/2016 at Unknown time  . buPROPion (WELLBUTRIN XL) 300 MG 24 hr tablet Take 300 mg by mouth daily.   12/14/2016 at Unknown time  . calcium carbonate (TUMS - DOSED IN MG ELEMENTAL CALCIUM) 500 MG chewable tablet Chew 2 tablets by mouth 4 (four) times daily as needed for indigestion or heartburn.   12/14/2016 at Unknown time  . ferrous sulfate 325 (65 FE) MG EC tablet Take 650 mg by mouth daily with breakfast.    12/14/2016 at Unknown time  . hydrOXYzine (VISTARIL) 50 MG capsule Take 1 capsule (50 mg total) by mouth 3 (three) times daily as needed. (Patient taking differently: Take 50 mg by mouth daily as needed. ) 30 capsule 0 12/13/2016 at Unknown time  . magnesium oxide (MAG-OX) 400 MG tablet  Take 400 mg by mouth as needed (For migraine.).    Past Month at Unknown time  . Melatonin 1 MG TABS Take 6 mg by mouth as needed.    Past Month at Unknown time  . multivitamin (VIT W/EXTRA C) CHEW chewable tablet Chew 1 tablet by mouth at bedtime as needed and may repeat dose one time if needed.    12/14/2016 at Unknown time  . omeprazole (PRILOSEC) 20 MG capsule Take 1 capsule (20 mg total) by mouth 2 (two) times daily before a meal. 1 tablet a day (Patient taking differently: Take 20 mg by mouth 2 (two) times daily before a meal. ) 60 capsule 6 12/14/2016 at Unknown time  . ondansetron (ZOFRAN-ODT) 8 MG disintegrating tablet DISSOLVE 1 TABLET BY MOUTH EVERY 8 HOURS AS NEEDED FOR NAUSEA AND VOMITING 20 tablet 0 12/14/2016 at Unknown time  . pantoprazole (PROTONIX) 20 MG tablet Take 1 tablet (20 mg total) by mouth 2 (two) times daily. 60 tablet 2 Past Week at Unknown time  . polyethylene glycol (MIRALAX / GLYCOLAX) packet Take 17 g by mouth daily. Twice a week she takes   Past Week at Unknown time  . promethazine (PHENERGAN) 25 MG tablet Take 12.5-25 mg by mouth every 6 (six) hours as needed for nausea or vomiting.    Past Week at Unknown time  . promethazine (PHENERGAN) 25 MG tablet TAKE 1/2 TO 1 TABLET BY MOUTH EVERY 6 HOURS AS NEEDED FOR NAUSEA AND VOMITING 30 tablet 0 Past Week at Unknown time  . PROVENTIL HFA 108 (90 Base) MCG/ACT inhaler INHALE 2 PUFFS INTO THE LUNGS EVERY 6 HOURS AS NEEDED FOR WHEEZING OR SHORTNESS OF BREATH 6.7 g 6 12/13/2016 at Unknown time  . PROVENTIL HFA 108 (90 Base) MCG/ACT inhaler INHALE 2 PUFFS INTO THE LUNGS EVERY 6 HOURS AS NEEDED FOR WHEEZING OR SHORTNESS OF BREATH 6.7 g 0 12/13/2016 at Unknown time  . pyridOXINE (VITAMIN B-6) 100 MG tablet Take 50 mg by mouth 2 (two) times daily.    12/14/2016 at Unknown time  . scopolamine (TRANSDERM-SCOP) 1 MG/3DAYS Place 1 patch (1.5 mg total) onto the skin every 3 (three) days. 10 patch 12 12/13/2016 at Unknown time  . zolpidem  (AMBIEN) 10 MG tablet Take 1 tablet (10 mg total) by mouth at bedtime as needed for sleep. (Patient taking differently: Take 5 mg by mouth at bedtime as needed for sleep. ) 15 tablet 3 12/13/2016 at Unknown time  . albuterol (PROVENTIL HFA;VENTOLIN HFA) 108 (90 Base) MCG/ACT inhaler Inhale 2 puffs into the lungs every 6 (  six) hours as needed for wheezing or shortness of breath.   Not Taking  . co-enzyme Q-10 30 MG capsule Take 100 mg by mouth as needed (For migraine.).    More than a month at Unknown time  . promethazine (PHENERGAN) 25 MG suppository Place 25 mg rectally every 6 (six) hours as needed for nausea or vomiting.   Not Taking  . Riboflavin (VITAMIN B-2 PO) Take 1 Dose by mouth as needed (For migraine.).    More than a month at Unknown time    Review of Systems  Constitutional: Negative.  Negative for chills and fever.  HENT: Negative.   Eyes: Positive for visual disturbance.  Respiratory: Negative.  Negative for shortness of breath.   Cardiovascular: Negative.  Negative for chest pain.  Gastrointestinal: Positive for abdominal pain and nausea. Negative for constipation, diarrhea and vomiting.  Genitourinary: Negative.  Negative for dysuria, vaginal bleeding and vaginal discharge.  Musculoskeletal: Positive for back pain.  Neurological: Negative.  Negative for dizziness and headaches.  Psychiatric/Behavioral: Negative.    Physical Exam   Blood pressure 122/75, pulse 89, temperature 98.2 F (36.8 C), temperature source Oral, resp. rate 16, height 5\' 2"  (1.575 m), weight 165 lb 12 oz (75.2 kg), last menstrual period 03/23/2016, SpO2 100 %.  Physical Exam  Nursing note and vitals reviewed. Constitutional: She is oriented to person, place, and time. She appears well-developed and well-nourished.  HENT:  Head: Normocephalic and atraumatic.  Eyes: Conjunctivae are normal. No scleral icterus.  Cardiovascular: Normal rate, regular rhythm and normal heart sounds.   Respiratory: Effort  normal and breath sounds normal. No respiratory distress.  GI: Soft. She exhibits no distension. There is no tenderness.  Neurological: She is alert and oriented to person, place, and time.  Skin: Skin is warm and dry.  Psychiatric: She has a normal mood and affect. Her behavior is normal. Judgment and thought content normal.   Dilation: 2 Effacement (%): Thick Cervical Position: Middle Station: Ballotable Exam by:: Haynes Bast SN[facility]  Fetal Tracing: Baseline: 130 Variability: moderate Accelerations: 15x15 Decelerations: none Toco: q 58min, irreg  MAU Course  Procedures Results for orders placed or performed during the hospital encounter of 12/14/16 (from the past 24 hour(s))  Urinalysis, Routine w reflex microscopic     Status: Abnormal   Collection Time: 12/14/16  9:11 PM  Result Value Ref Range   Color, Urine YELLOW YELLOW   APPearance CLEAR CLEAR   Specific Gravity, Urine 1.017 1.005 - 1.030   pH 7.0 5.0 - 8.0   Glucose, UA NEGATIVE NEGATIVE mg/dL   Hgb urine dipstick NEGATIVE NEGATIVE   Bilirubin Urine NEGATIVE NEGATIVE   Ketones, ur NEGATIVE NEGATIVE mg/dL   Protein, ur 30 (A) NEGATIVE mg/dL   Nitrite NEGATIVE NEGATIVE   Leukocytes, UA TRACE (A) NEGATIVE   RBC / HPF NONE SEEN 0 - 5 RBC/hpf   WBC, UA 0-5 0 - 5 WBC/hpf   Bacteria, UA NONE SEEN NONE SEEN   Squamous Epithelial / LPF 0-5 (A) NONE SEEN   Mucous PRESENT     MAU course: UA NST Pepcid Zofran Tylenol Flexeril  MDM: Labs ordered and reviewed. No evidence of labor or UTI. Pt feeling good FM since EFM in place. Back pain improved after Flexeril. Stable for discharge home.  Assessment and Plan   1. [redacted] weeks gestation of pregnancy   2. NST (non-stress test) reactive   3. Decreased fetal movements in third trimester, single or unspecified fetus   4. Back  pain affecting pregnancy in third trimester    Discharge home Follow up in OB office as scheduled Labor precautions FMCs  Allergies as of 12/14/2016       Reactions   Bee Venom Other (See Comments)   Reaction:  Localized swelling    Penicillins Itching, Other (See Comments)   Has patient had a PCN reaction causing immediate rash, facial/tongue/throat swelling, SOB or lightheadedness with hypotension: No Has patient had a PCN reaction causing severe rash involving mucus membranes or skin necrosis: No Has patient had a PCN reaction that required hospitalization No Has patient had a PCN reaction occurring within the last 10 years: Yes If all of the above answers are "NO", then may proceed with Cephalosporin use.   Reglan [metoclopramide] Anxiety      Medication List    STOP taking these medications   Melatonin 1 MG Tabs   pantoprazole 20 MG tablet Commonly known as:  PROTONIX     TAKE these medications   acetaminophen 500 MG tablet Commonly known as:  TYLENOL Take 500 mg by mouth every 6 (six) hours as needed for mild pain or headache.   acyclovir 400 MG tablet Commonly known as:  ZOVIRAX TAKE 1 TABLET BY MOUTH THREE TIMES DAILY   buPROPion 300 MG 24 hr tablet Commonly known as:  WELLBUTRIN XL Take 300 mg by mouth daily.   calcium carbonate 500 MG chewable tablet Commonly known as:  TUMS - dosed in mg elemental calcium Chew 2 tablets by mouth 4 (four) times daily as needed for indigestion or heartburn.   co-enzyme Q-10 30 MG capsule Take 100 mg by mouth as needed (For migraine.).   ferrous sulfate 325 (65 FE) MG EC tablet Take 650 mg by mouth daily with breakfast.   hydrOXYzine 50 MG capsule Commonly known as:  VISTARIL Take 1 capsule (50 mg total) by mouth 3 (three) times daily as needed. What changed:  when to take this   magnesium oxide 400 MG tablet Commonly known as:  MAG-OX Take 400 mg by mouth as needed (For migraine.).   multivitamin Chew chewable tablet Chew 1 tablet by mouth at bedtime as needed and may repeat dose one time if needed.   omeprazole 20 MG capsule Commonly known as:  PRILOSEC Take 1  capsule (20 mg total) by mouth 2 (two) times daily before a meal. 1 tablet a day What changed:  additional instructions   ondansetron 8 MG disintegrating tablet Commonly known as:  ZOFRAN-ODT DISSOLVE 1 TABLET BY MOUTH EVERY 8 HOURS AS NEEDED FOR NAUSEA AND VOMITING   polyethylene glycol packet Commonly known as:  MIRALAX / GLYCOLAX Take 17 g by mouth daily. Twice a week she takes   promethazine 25 MG tablet Commonly known as:  PHENERGAN Take 12.5-25 mg by mouth every 6 (six) hours as needed for nausea or vomiting. What changed:  Another medication with the same name was removed. Continue taking this medication, and follow the directions you see here.   promethazine 25 MG suppository Commonly known as:  PHENERGAN Place 25 mg rectally every 6 (six) hours as needed for nausea or vomiting. What changed:  Another medication with the same name was removed. Continue taking this medication, and follow the directions you see here.   PROVENTIL HFA 108 (90 Base) MCG/ACT inhaler Generic drug:  albuterol INHALE 2 PUFFS INTO THE LUNGS EVERY 6 HOURS AS NEEDED FOR WHEEZING OR SHORTNESS OF BREATH What changed:  Another medication with the same name was removed.  Continue taking this medication, and follow the directions you see here.   PROVENTIL HFA 108 (90 Base) MCG/ACT inhaler Generic drug:  albuterol INHALE 2 PUFFS INTO THE LUNGS EVERY 6 HOURS AS NEEDED FOR WHEEZING OR SHORTNESS OF BREATH What changed:  Another medication with the same name was removed. Continue taking this medication, and follow the directions you see here.   pyridOXINE 100 MG tablet Commonly known as:  VITAMIN B-6 Take 50 mg by mouth 2 (two) times daily.   scopolamine 1 MG/3DAYS Commonly known as:  TRANSDERM-SCOP Place 1 patch (1.5 mg total) onto the skin every 3 (three) days.   VITAMIN B-2 PO Take 1 Dose by mouth as needed (For migraine.).   zolpidem 10 MG tablet Commonly known as:  AMBIEN Take 1 tablet (10 mg total)  by mouth at bedtime as needed for sleep. What changed:  how much to take      Julianne Handler, [place]  12/14/2016 11:57 PM

## 2016-12-14 NOTE — MAU Note (Signed)
Hasn't felt baby move since this morning. Feeling contractions since last night. No bleeding. Mucus plug. No leaking. Blurry vision over last couple of hours and a couple weeks ago.

## 2016-12-15 ENCOUNTER — Inpatient Hospital Stay (HOSPITAL_COMMUNITY)
Admission: AD | Admit: 2016-12-15 | Discharge: 2016-12-17 | DRG: 775 | Disposition: A | Discharge status: Home / Self Care | Payer: Medicaid Other | Source: Ambulatory Visit | Attending: Obstetrics & Gynecology | Admitting: Obstetrics & Gynecology

## 2016-12-15 ENCOUNTER — Inpatient Hospital Stay (HOSPITAL_COMMUNITY): Payer: Medicaid Other | Admitting: Anesthesiology

## 2016-12-15 ENCOUNTER — Encounter (HOSPITAL_COMMUNITY): Payer: Self-pay

## 2016-12-15 DIAGNOSIS — O9952 Diseases of the respiratory system complicating childbirth: Secondary | ICD-10-CM | POA: Diagnosis present

## 2016-12-15 DIAGNOSIS — Z880 Allergy status to penicillin: Secondary | ICD-10-CM

## 2016-12-15 DIAGNOSIS — Z3493 Encounter for supervision of normal pregnancy, unspecified, third trimester: Secondary | ICD-10-CM | POA: Diagnosis present

## 2016-12-15 DIAGNOSIS — Z87891 Personal history of nicotine dependence: Secondary | ICD-10-CM | POA: Diagnosis not present

## 2016-12-15 DIAGNOSIS — M549 Dorsalgia, unspecified: Secondary | ICD-10-CM | POA: Diagnosis not present

## 2016-12-15 DIAGNOSIS — K219 Gastro-esophageal reflux disease without esophagitis: Secondary | ICD-10-CM | POA: Diagnosis present

## 2016-12-15 DIAGNOSIS — O26893 Other specified pregnancy related conditions, third trimester: Secondary | ICD-10-CM | POA: Diagnosis not present

## 2016-12-15 DIAGNOSIS — O9962 Diseases of the digestive system complicating childbirth: Secondary | ICD-10-CM | POA: Diagnosis present

## 2016-12-15 DIAGNOSIS — Z3689 Encounter for other specified antenatal screening: Secondary | ICD-10-CM | POA: Diagnosis not present

## 2016-12-15 DIAGNOSIS — D251 Intramural leiomyoma of uterus: Secondary | ICD-10-CM | POA: Diagnosis present

## 2016-12-15 DIAGNOSIS — J45909 Unspecified asthma, uncomplicated: Secondary | ICD-10-CM | POA: Diagnosis present

## 2016-12-15 DIAGNOSIS — O3413 Maternal care for benign tumor of corpus uteri, third trimester: Secondary | ICD-10-CM | POA: Diagnosis present

## 2016-12-15 DIAGNOSIS — Z3A38 38 weeks gestation of pregnancy: Secondary | ICD-10-CM

## 2016-12-15 DIAGNOSIS — O368130 Decreased fetal movements, third trimester, not applicable or unspecified: Secondary | ICD-10-CM | POA: Diagnosis not present

## 2016-12-15 LAB — CBC
HCT: 33.6 % — ABNORMAL LOW (ref 36.0–46.0)
HEMOGLOBIN: 10.6 g/dL — AB (ref 12.0–15.0)
MCH: 20.7 pg — AB (ref 26.0–34.0)
MCHC: 31.5 g/dL (ref 30.0–36.0)
MCV: 65.8 fL — ABNORMAL LOW (ref 78.0–100.0)
PLATELETS: 171 10*3/uL (ref 150–400)
RBC: 5.11 MIL/uL (ref 3.87–5.11)
RDW: 18.5 % — ABNORMAL HIGH (ref 11.5–15.5)
WBC: 5.6 10*3/uL (ref 4.0–10.5)

## 2016-12-15 LAB — TYPE AND SCREEN
ABO/RH(D): B POS
ANTIBODY SCREEN: NEGATIVE

## 2016-12-15 MED ORDER — PHENYLEPHRINE 40 MCG/ML (10ML) SYRINGE FOR IV PUSH (FOR BLOOD PRESSURE SUPPORT)
80.0000 ug | PREFILLED_SYRINGE | INTRAVENOUS | Status: DC | PRN
  Filled 2016-12-15: qty 10
  Filled 2016-12-15: qty 5

## 2016-12-15 MED ORDER — LACTATED RINGERS IV SOLN: 500.0000 mL | INTRAVENOUS | Status: DC | PRN

## 2016-12-15 MED ORDER — LACTATED RINGERS IV SOLN: 500.0000 mL | Freq: Once | INTRAVENOUS | Status: DC

## 2016-12-15 MED ORDER — ONDANSETRON HCL 4 MG/2ML IJ SOLN: 4.0000 mg | INTRAMUSCULAR | Status: DC | PRN

## 2016-12-15 MED ORDER — OXYCODONE-ACETAMINOPHEN 5-325 MG PO TABS: 2.0000 | ORAL_TABLET | ORAL | Status: DC | PRN

## 2016-12-15 MED ORDER — OXYTOCIN 40 UNITS IN LACTATED RINGERS INFUSION - SIMPLE MED
2.5000 [IU]/h | INTRAVENOUS | Status: DC
  Filled 2016-12-15: qty 1000

## 2016-12-15 MED ORDER — ONDANSETRON HCL 4 MG PO TABS
4.0000 mg | ORAL_TABLET | ORAL | Status: DC | PRN
  Administered 2016-12-17 (×2): 4 mg via ORAL
  Filled 2016-12-15 (×2): qty 1

## 2016-12-15 MED ORDER — LACTATED RINGERS IV SOLN
INTRAVENOUS | Status: DC
  Administered 2016-12-15 (×2): via INTRAVENOUS

## 2016-12-15 MED ORDER — SIMETHICONE 80 MG PO CHEW: 80.0000 mg | CHEWABLE_TABLET | ORAL | Status: DC | PRN

## 2016-12-15 MED ORDER — BENZOCAINE-MENTHOL 20-0.5 % EX AERO
1.0000 "application " | INHALATION_SPRAY | CUTANEOUS | Status: DC | PRN
  Filled 2016-12-15: qty 56

## 2016-12-15 MED ORDER — FERROUS SULFATE 325 (65 FE) MG PO TABS
325.0000 mg | ORAL_TABLET | Freq: Every day | ORAL | Status: DC
  Administered 2016-12-16 – 2016-12-17 (×2): 325 mg via ORAL
  Filled 2016-12-15 (×2): qty 1

## 2016-12-15 MED ORDER — TETANUS-DIPHTH-ACELL PERTUSSIS 5-2.5-18.5 LF-MCG/0.5 IM SUSP: 0.5000 mL | Freq: Once | INTRAMUSCULAR | Status: DC

## 2016-12-15 MED ORDER — SODIUM CHLORIDE 0.9% FLUSH: 3.0000 mL | INTRAVENOUS | Status: DC | PRN

## 2016-12-15 MED ORDER — DIPHENHYDRAMINE HCL 25 MG PO CAPS: 25.0000 mg | ORAL_CAPSULE | Freq: Four times a day (QID) | ORAL | Status: DC | PRN

## 2016-12-15 MED ORDER — IBUPROFEN 600 MG PO TABS
600.0000 mg | ORAL_TABLET | Freq: Four times a day (QID) | ORAL | Status: DC
  Administered 2016-12-15 – 2016-12-17 (×6): 600 mg via ORAL
  Filled 2016-12-15 (×7): qty 1

## 2016-12-15 MED ORDER — PANTOPRAZOLE SODIUM 40 MG PO TBEC
40.0000 mg | DELAYED_RELEASE_TABLET | Freq: Every day | ORAL | Status: DC
  Administered 2016-12-16 – 2016-12-17 (×2): 40 mg via ORAL
  Filled 2016-12-15 (×2): qty 1

## 2016-12-15 MED ORDER — FENTANYL 2.5 MCG/ML BUPIVACAINE 1/10 % EPIDURAL INFUSION (WH - ANES)
14.0000 mL/h | INTRAMUSCULAR | Status: DC | PRN
  Administered 2016-12-15: 14 mL/h via EPIDURAL
  Filled 2016-12-15: qty 100

## 2016-12-15 MED ORDER — WITCH HAZEL-GLYCERIN EX PADS: 1.0000 "application " | MEDICATED_PAD | CUTANEOUS | Status: DC | PRN

## 2016-12-15 MED ORDER — DIPHENHYDRAMINE HCL 50 MG/ML IJ SOLN: 12.5000 mg | INTRAMUSCULAR | Status: DC | PRN

## 2016-12-15 MED ORDER — ONDANSETRON HCL 4 MG/2ML IJ SOLN: 4.0000 mg | Freq: Four times a day (QID) | INTRAMUSCULAR | Status: DC | PRN

## 2016-12-15 MED ORDER — BUPROPION HCL ER (XL) 300 MG PO TB24
300.0000 mg | ORAL_TABLET | Freq: Every day | ORAL | Status: DC
  Administered 2016-12-16 – 2016-12-17 (×2): 300 mg via ORAL
  Filled 2016-12-15 (×3): qty 1

## 2016-12-15 MED ORDER — DIBUCAINE 1 % RE OINT: 1.0000 "application " | TOPICAL_OINTMENT | RECTAL | Status: DC | PRN

## 2016-12-15 MED ORDER — OXYCODONE-ACETAMINOPHEN 5-325 MG PO TABS: 1.0000 | ORAL_TABLET | ORAL | Status: DC | PRN

## 2016-12-15 MED ORDER — ALBUTEROL SULFATE (2.5 MG/3ML) 0.083% IN NEBU
2.5000 mg | INHALATION_SOLUTION | Freq: Four times a day (QID) | RESPIRATORY_TRACT | Status: DC
  Filled 2016-12-15: qty 3

## 2016-12-15 MED ORDER — ACETAMINOPHEN 325 MG PO TABS
650.0000 mg | ORAL_TABLET | ORAL | Status: DC | PRN
Start: 2016-12-15 — End: 2016-12-15

## 2016-12-15 MED ORDER — ZOLPIDEM TARTRATE 5 MG PO TABS: 5.0000 mg | ORAL_TABLET | Freq: Every evening | ORAL | Status: DC | PRN

## 2016-12-15 MED ORDER — PHENYLEPHRINE 40 MCG/ML (10ML) SYRINGE FOR IV PUSH (FOR BLOOD PRESSURE SUPPORT)
80.0000 ug | PREFILLED_SYRINGE | INTRAVENOUS | Status: DC | PRN
  Filled 2016-12-15: qty 5
  Filled 2016-12-15: qty 10

## 2016-12-15 MED ORDER — MEASLES, MUMPS & RUBELLA VAC ~~LOC~~ INJ
0.5000 mL | INJECTION | Freq: Once | SUBCUTANEOUS | Status: DC
  Filled 2016-12-15: qty 0.5

## 2016-12-15 MED ORDER — SODIUM CHLORIDE 0.9 % IV SOLN: 250.0000 mL | INTRAVENOUS | Status: DC | PRN

## 2016-12-15 MED ORDER — OXYTOCIN BOLUS FROM INFUSION
500.0000 mL | Freq: Once | INTRAVENOUS | Status: AC
  Administered 2016-12-15: 500 mL via INTRAVENOUS

## 2016-12-15 MED ORDER — OXYTOCIN 40 UNITS IN LACTATED RINGERS INFUSION - SIMPLE MED
1.0000 m[IU]/min | INTRAVENOUS | Status: DC
  Administered 2016-12-15: 2 m[IU]/min via INTRAVENOUS

## 2016-12-15 MED ORDER — ALBUTEROL SULFATE HFA 108 (90 BASE) MCG/ACT IN AERS
2.0000 | INHALATION_SPRAY | Freq: Four times a day (QID) | RESPIRATORY_TRACT | Status: DC
  Filled 2016-12-15: qty 6.7

## 2016-12-15 MED ORDER — SENNOSIDES-DOCUSATE SODIUM 8.6-50 MG PO TABS
2.0000 | ORAL_TABLET | ORAL | Status: DC
Start: 2016-12-16 — End: 2016-12-17
  Administered 2016-12-15 – 2016-12-16 (×2): 2 via ORAL
  Filled 2016-12-15 (×2): qty 2

## 2016-12-15 MED ORDER — LIDOCAINE HCL (PF) 1 % IJ SOLN
INTRAMUSCULAR | Status: DC | PRN
  Administered 2016-12-15 (×2): 5 mL

## 2016-12-15 MED ORDER — SODIUM CHLORIDE 0.9% FLUSH: 3.0000 mL | Freq: Two times a day (BID) | INTRAVENOUS | Status: DC

## 2016-12-15 MED ORDER — PRENATAL MULTIVITAMIN CH
1.0000 | ORAL_TABLET | Freq: Every day | ORAL | Status: DC
  Administered 2016-12-16: 1 via ORAL
  Filled 2016-12-15: qty 1

## 2016-12-15 MED ORDER — EPHEDRINE 5 MG/ML INJ
10.0000 mg | INTRAVENOUS | Status: DC | PRN
  Filled 2016-12-15: qty 2

## 2016-12-15 MED ORDER — TERBUTALINE SULFATE 1 MG/ML IJ SOLN
0.2500 mg | Freq: Once | INTRAMUSCULAR | Status: DC | PRN
  Filled 2016-12-15: qty 1

## 2016-12-15 MED ORDER — ACETAMINOPHEN 325 MG PO TABS
650.0000 mg | ORAL_TABLET | ORAL | Status: DC | PRN
  Administered 2016-12-16 – 2016-12-17 (×4): 650 mg via ORAL
  Filled 2016-12-15 (×4): qty 2

## 2016-12-15 MED ORDER — COCONUT OIL OIL
1.0000 "application " | TOPICAL_OIL | Status: DC | PRN
  Administered 2016-12-16: 1 via TOPICAL
  Filled 2016-12-15: qty 120

## 2016-12-15 MED ORDER — SOD CITRATE-CITRIC ACID 500-334 MG/5ML PO SOLN: 30.0000 mL | ORAL | Status: DC | PRN

## 2016-12-15 MED ORDER — LIDOCAINE HCL (PF) 1 % IJ SOLN
30.0000 mL | INTRAMUSCULAR | Status: DC | PRN
  Filled 2016-12-15: qty 30

## 2016-12-15 NOTE — Progress Notes (Signed)
Charlotte Mccormick is a 31 y.o. G2P1001 at [redacted]w[redacted]d by ultrasound admitted for active labor  Subjective:   Objective: BP 102/63   Pulse 95   Temp 98.1 F (36.7 C) (Oral)   Resp 18   Ht 5\' 2"  (1.575 m)   Wt 165 lb (74.8 kg)   LMP 03/23/2016   SpO2 100%   BMI 30.18 kg/m  No intake/output data recorded. No intake/output data recorded.  FHT:  FHR: 130's bpm, variability: moderate,  accelerations:  Present,  decelerations:  Absent UC:   regular, every 2-3 minutes SVE:   Dilation: 7 Effacement (%): 70 Station: -2 Exam by:: k fields, rn  Labs: Lab Results  Component Value Date   WBC 5.6 12/15/2016   HGB 10.6 (L) 12/15/2016   HCT 33.6 (L) 12/15/2016   MCV 65.8 (L) 12/15/2016   PLT 171 12/15/2016    Assessment / Plan: Augmentation of labor, progressing well  Labor: Progressing normally Preeclampsia:  no signs or symptoms of toxicity Fetal Wellbeing:  Category I Pain Control:  Epidural I/D:  n/a Anticipated MOD:  NSVD  [name] 12/15/2016, 3:43 PM

## 2016-12-15 NOTE — Anesthesia Procedure Notes (Signed)
Epidural Patient location during procedure: OB  Staffing Anesthesiologist: Gillis Boardley Performed: anesthesiologist   Preanesthetic Checklist Completed: patient identified, site marked, surgical consent, pre-op evaluation, timeout performed, IV checked, risks and benefits discussed and monitors and equipment checked  Epidural Patient position: sitting Prep: DuraPrep Patient monitoring: heart rate, continuous pulse ox and blood pressure Approach: right paramedian Location: L3-L4 Injection technique: LOR saline  Needle:  Needle type: Tuohy  Needle gauge: 17 G Needle length: 9 cm and 9 Needle insertion depth: 6 cm Catheter type: closed end flexible Catheter size: 20 Guage Catheter at skin depth: 10 cm Test dose: negative  Assessment Events: blood not aspirated, injection not painful, no injection resistance, negative IV test and no paresthesia  Additional Notes Patient identified. Risks/Benefits/Options discussed with patient including but not limited to bleeding, infection, nerve damage, paralysis, failed block, incomplete pain control, headache, blood pressure changes, nausea, vomiting, reactions to medication both or allergic, itching and postpartum back pain. Confirmed with bedside nurse the patient's most recent platelet count. Confirmed with patient that they are not currently taking any anticoagulation, have any bleeding history or any family history of bleeding disorders. Patient expressed understanding and wished to proceed. All questions were answered. Sterile technique was used throughout the entire procedure. Please see nursing notes for vital signs. Test dose was given through epidural needle and negative prior to continuing to dose epidural or start infusion. Warning signs of high block given to the patient including shortness of breath, tingling/numbness in hands, complete motor block, or any concerning symptoms with instructions to call for help. Patient was given  instructions on fall risk and not to get out of bed. All questions and concerns addressed with instructions to call with any issues.     

## 2016-12-15 NOTE — Anesthesia Preprocedure Evaluation (Signed)
Anesthesia Evaluation  Patient identified by MRN, date of birth, ID band Patient awake    Reviewed: Allergy & Precautions, H&P , NPO status , Patient's Chart, lab work & pertinent test results  History of Anesthesia Complications Negative for: history of anesthetic complications  Airway Mallampati: II  TM Distance: >3 FB Neck ROM: full    Dental no notable dental hx. (+) Teeth Intact   Pulmonary asthma , former smoker,    Pulmonary exam normal breath sounds clear to auscultation       Cardiovascular negative cardio ROS Normal cardiovascular exam Rhythm:regular Rate:Normal     Neuro/Psych negative neurological ROS  negative psych ROS   GI/Hepatic negative GI ROS, Neg liver ROS,   Endo/Other  negative endocrine ROS  Renal/GU negative Renal ROS  negative genitourinary   Musculoskeletal   Abdominal   Peds  Hematology negative hematology ROS (+)   Anesthesia Other Findings   Reproductive/Obstetrics (+) Pregnancy                             Anesthesia Physical Anesthesia Plan  ASA: II  Anesthesia Plan: Epidural   Post-op Pain Management:    Induction:   PONV Risk Score and Plan:   Airway Management Planned:   Additional Equipment:   Intra-op Plan:   Post-operative Plan:   Informed Consent: I have reviewed the patients History and Physical, chart, labs and discussed the procedure including the risks, benefits and alternatives for the proposed anesthesia with the patient or authorized representative who has indicated his/her understanding and acceptance.     Plan Discussed with:   Anesthesia Plan Comments:         Anesthesia Quick Evaluation  

## 2016-12-15 NOTE — Progress Notes (Signed)
Charlotte Mccormick is a 31 y.o. G2P1001 at [redacted]w[redacted]d by ultrasound admitted for active labor  Subjective:   Objective: BP 93/61   Pulse (!) 110   Temp 98.3 F (36.8 C) (Oral)   Resp 18   Ht 5\' 2"  (4.715 m)   Wt 165 lb (74.8 kg)   LMP 03/23/2016   SpO2 100%   BMI 30.18 kg/m  No intake/output data recorded. No intake/output data recorded.  FHT:  FHR: 125-130 bpm, variability: moderate,  accelerations:  Present,  decelerations:  Present early UC:   regular, every 2-5 minutes SVE:   Dilation: 8.5 Effacement (%): 80 Station: -1 Exam by:: k fields, rn  Labs: Lab Results  Component Value Date   WBC 5.6 12/15/2016   HGB 10.6 (L) 12/15/2016   HCT 33.6 (L) 12/15/2016   MCV 65.8 (L) 12/15/2016   PLT 171 12/15/2016    Assessment / Plan: Augmentation of labor, progressing well  Labor: Progressing normally Preeclampsia:  no signs or symptoms of toxicity Fetal Wellbeing:  Category I and Category II Pain Control:  Epidural I/D:  n/a Anticipated MOD:  NSVD  [name] 12/15/2016, 6:26 PM

## 2016-12-15 NOTE — MAU Note (Signed)
Pt c/o contractions all night that got worse st 4am. Pt denies bleeding and leaking of fluid. Pt states baby is moving normally.

## 2016-12-15 NOTE — H&P (Signed)
Charlotte Mccormick is a 31 y.o. female presenting for SOL @ 38.1 wks. GBS neg OB History    Gravida Para Term Preterm AB Living   2 1 1     1    SAB TAB Ectopic Multiple Live Births           1     Past Medical History:  Diagnosis Date  . Abnormal uterine bleeding (AUB) 12/12/2015  . Anemia   . Anxiety   . Asthma   . Constipation   . Depression   . Fibroid   . Genital herpes   . GERD (gastroesophageal reflux disease)   . History of chlamydia   . Hyperemesis   . Insomnia   . PTSD (post-traumatic stress disorder)   . Sinusitis   . Vaginal dryness 12/12/2015   Past Surgical History:  Procedure Laterality Date  . COLONOSCOPY N/A 04/08/2016   Procedure: COLONOSCOPY;  Surgeon: Danie Binder, MD;  Location: [facility];  Service: Endoscopy;  Laterality: N/A;  10:15 AM  . ESOPHAGOGASTRODUODENOSCOPY N/A 04/08/2016   Procedure: ESOPHAGOGASTRODUODENOSCOPY (EGD);  Surgeon: Danie Binder, MD;  Location: [facility];  Service: Endoscopy;  Laterality: N/A;  . PERINEUM REPAIR     Family History: family history includes Diabetes in her father; Eczema in her daughter; Epilepsy in her brother; Heart disease in her mother; Hyperlipidemia in her father; Hypertension in her father and mother. Social History:  reports that she quit smoking about 6 years ago. Her smoking use included Cigarettes. She has a 2.50 pack-year smoking history. She has quit using smokeless tobacco. She reports that she does not drink alcohol or use drugs.     Maternal Diabetes: No Genetic Screening: Normal Maternal Ultrasounds/Referrals: Normal Fetal Ultrasounds or other Referrals:  None Maternal Substance Abuse:  No Significant Maternal Medications:  None Significant Maternal Lab Results:  None Other Comments:  None  Review of Systems  Constitutional: Negative.   HENT: Negative.   Eyes: Negative.   Respiratory: Negative.   Cardiovascular: Negative.   Gastrointestinal: Positive for abdominal pain.   Genitourinary: Negative.   Musculoskeletal: Negative.   Skin: Negative.   Neurological: Negative.   Endo/Heme/Allergies: Negative.   Psychiatric/Behavioral: Negative.    Maternal Medical History:  Reason for admission: Contractions.   Contractions: Onset was 6-12 hours ago.   Frequency: regular.   Perceived severity is moderate.    Fetal activity: Perceived fetal activity is normal.   Last perceived fetal movement was within the past hour.    Prenatal complications: no prenatal complications Prenatal Complications - Diabetes: none.    Dilation: 5.5 Effacement (%): 70 Station: -2 Exam by:: cwhite,rnc Blood pressure 107/74, pulse (!) 104, temperature 97.9 F (36.6 C), resp. rate 18, height 5\' 2"  (1.575 m), weight 165 lb (74.8 kg), last menstrual period 03/23/2016, SpO2 100 %. Maternal Exam:  Uterine Assessment: Contraction strength is moderate.  Contraction frequency is regular.   Abdomen: Patient reports no abdominal tenderness. Fetal presentation: vertex  Introitus: Normal vulva. Normal vagina.  Ferning test: not done.  Nitrazine test: not done. Amniotic fluid character: not assessed.  Pelvis: adequate for delivery.   Cervix: Cervix evaluated by digital exam.     Fetal Exam Fetal Monitor Review: Mode: ultrasound.   Variability: moderate (6-25 bpm).   Pattern: accelerations present.    Fetal State Assessment: Category I - tracings are normal.     Physical Exam  Constitutional: She is oriented to person, place, and time. She appears well-developed and well-nourished.  HENT:  Head: Normocephalic.  Eyes: Pupils are equal, round, and reactive to light.  Neck: Normal range of motion.  Cardiovascular: Normal rate, regular rhythm, normal heart sounds and intact distal pulses.   Respiratory: Effort normal and breath sounds normal.  GI: Soft. Bowel sounds are normal.  Genitourinary: Vagina normal and uterus normal.  Musculoskeletal: Normal range of motion.   Neurological: She is alert and oriented to person, place, and time. She has normal reflexes.  Skin: Skin is warm and dry.  Psychiatric: She has a normal mood and affect. Her behavior is normal. Judgment and thought content normal.    Prenatal labs: ABO, Rh: B/Positive/-- (12/20 1445) Antibody: Negative (05/07 1042) Rubella: 12.90 (12/20 1445) RPR: Non Reactive (05/07 1042)  HBsAg: Negative (12/20 1445)  HIV: Non Reactive (05/07 1042)  GBS:     Assessment/Plan: SOL @ 38.1 wks SVE 5-6/80/-2 GBS neg Admit   [name] 12/15/2016, 12:22 PM

## 2016-12-16 NOTE — Progress Notes (Signed)
UR chart review completed.  
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
## 2016-12-16 NOTE — Anesthesia Postprocedure Evaluation (Signed)
Anesthesia Post Note  Patient: Charlotte Mccormick  Procedure(s) Performed: * No procedures listed *     Patient location during evaluation: Mother Baby Anesthesia Type: Epidural Level of consciousness: awake, awake and alert, oriented and patient cooperative Pain management: pain level controlled Vital Signs Assessment: post-procedure vital signs reviewed and stable Respiratory status: spontaneous breathing, nonlabored ventilation and respiratory function stable Cardiovascular status: stable Postop Assessment: no headache, no backache, patient able to bend at knees and no signs of nausea or vomiting Anesthetic complications: no    Last Vitals:  Vitals:   12/15/16 2150 12/16/16 0150  BP: 112/79 114/78  Pulse: (!) 103 (!) 111  Resp: 20 20  Temp: 36.7 C 36.9 C    Last Pain:  Vitals:   12/16/16 0623  TempSrc:   PainSc: 3    Pain Goal: Patients Stated Pain Goal: 3 (12/15/16 1128)               Milah Recht L

## 2016-12-16 NOTE — Lactation Note (Signed)
This note was copied from a baby's chart. Lactation Consultation Note  Patient Name: Charlotte Mccormick [id number] Date: 12/16/2016 Reason for consult: Follow-up assessment  Mom calls for assist with latching.  Baby already latched when [facility] arrived.  Baby has wide gape and strong sucking.  When baby stopped LC assisted with latching to right breast in football hold.  Mom reports cramping with feedings but unsure if baby is getting enough.  Baby has been sleepy today.  Discussed cluster feeding and feeding cue.  Mom denies pain with latching.  Mom reports she has coconut oil because she thought she was supposed to use something.  Lc encouraged mom to keep working on hand expression and mom denies nipple pain at this time.   Baby continues feeding and mom aware to call as needed.     Maternal Data Has patient been taught Hand Expression?: Yes  Feeding Feeding Type: Breast Fed  LATCH Score/Interventions Latch: Repeated attempts needed to sustain latch, nipple held in mouth throughout feeding, stimulation needed to elicit sucking reflex. Intervention(s): Adjust position;Assist with latch;Breast massage;Breast compression  Audible Swallowing: A few with stimulation Intervention(s): Skin to skin Intervention(s): Skin to skin;Hand expression;Alternate breast massage  Type of Nipple: Everted at rest and after stimulation (right semi flat) Intervention(s): No intervention needed  Comfort (Breast/Nipple): Soft / non-tender     Hold (Positioning): Assistance needed to correctly position infant at breast and maintain latch. Intervention(s): Breastfeeding basics reviewed;Support Pillows;Position options;Skin to skin  LATCH Score: 7  Lactation Tools Discussed/Used     Consult Status Consult Status: Follow-up Date: 12/17/16 Follow-up type: In-patient    Charlotte Mccormick 12/16/2016, 9:37 PM

## 2016-12-16 NOTE — Lactation Note (Signed)
This note was copied from a baby's chart. Lactation Consultation Note  Patient Name: Charlotte Mccormick [id number] Date: 12/16/2016 Reason for consult: Follow-up assessment Baby at 17 hr of life. Mom is concerned about low supply. She reports baby is having trouble latching. She has "tried pumping but I am not producing like that". Mom has pendulous breast with short shaft nipples. Showed mom how to "tea cup" the areola and position the baby at the breast. Encouraged mom to keep pumping, explained nipple stimulation. Discussed baby behavior, feeding frequency, baby belly size, voids, wt loss, breast changes, and nipple care. Demonstrated manual expression, colostrum noted bilaterally, spoon in room. Mom is aware of lactation services and support group. Mom will offer the breast on demand, post pump, and offer supplement as needed per volume guidelines. She will try to latch baby at the next feeding and will call for help as needed.     Maternal Data    Feeding Feeding Type: Breast Fed Nipple Type: Slow - flow Length of feed: 10 min  LATCH Score/Interventions Latch: Repeated attempts needed to sustain latch, nipple held in mouth throughout feeding, stimulation needed to elicit sucking reflex. Intervention(s): Adjust position;Assist with latch  Audible Swallowing: A few with stimulation Intervention(s): Skin to skin;Hand expression Intervention(s): Alternate breast massage  Type of Nipple: Everted at rest and after stimulation (short shaft)  Comfort (Breast/Nipple): Soft / non-tender     Hold (Positioning): Assistance needed to correctly position infant at breast and maintain latch. Intervention(s): Position options;Support Pillows  LATCH Score: 7  Lactation Tools Discussed/Used     Consult Status Consult Status: Follow-up Date: 12/17/16 Follow-up type: In-patient    Denzil Hughes 12/16/2016, 12:27 PM

## 2016-12-16 NOTE — Progress Notes (Signed)
Post Partum Day #1 Subjective: no complaints, up ad lib and tolerating PO; breastfeeding going slowly; plans POP bridge to BTL; circ at FT  Objective: Blood pressure 114/78, pulse (!) 111, temperature 98.4 F (36.9 C), temperature source Oral, resp. rate 20, height 5\' 2"  (1.575 m), weight 74.8 kg (165 lb), last menstrual period 03/23/2016, SpO2 100 %, unknown if currently breastfeeding.  Physical Exam:  General: alert, cooperative and no distress Lochia: appropriate Uterine Fundus: firm DVT Evaluation: No evidence of DVT seen on physical exam.   Recent Labs  12/15/16 1157  HGB 10.6*  HCT 33.6*    Assessment/Plan: Plan for discharge tomorrow and Breastfeeding   LOS: 1 day   Geraldy Akridge, [place] 12/16/2016, 7:48 AM

## 2016-12-16 NOTE — Lactation Note (Signed)
This note was copied from a baby's chart. Lactation Consultation Note Mom's 2nd baby. Didn't BF her 1st child. Mom stated she would try BF w/this baby, mom not enjoying BF so far. Requesting formula. Mom states she is going to be breast/formula. Mom grimacing d/t cramping.  Discussed supply and demand, giving breast first before formula. Alimentum given, supplementing feeding sheet given.  Educated on newborn behavior and feeding habits. Noted pacifier in rm, encouraged not to use.  Mom shown how to use DEBP & how to disassemble, clean, & reassemble parts. Mom knows to pump q3h for 15-20 min.  Mom encouraged to feed baby 8-12 times/24 hours and with feeding cues.  [place] brochure given w/resources, support groups and [place] services. Mom has [place]. Patient Name: Charlotte Mccormick [id number] Date: 12/16/2016 Reason for consult: Initial assessment   Maternal Data    Feeding Feeding Type: Formula Nipple Type: Slow - flow Length of feed: 5 min  LATCH Score/Interventions       Type of Nipple: Everted at rest and after stimulation  Comfort (Breast/Nipple): Soft / non-tender     Intervention(s): Breastfeeding basics reviewed;Support Pillows;Position options;Skin to skin     Lactation Tools Discussed/Used Tools: Pump Breast pump type: Double-Electric Breast Pump WIC Program: Yes Pump Review: Setup, frequency, and cleaning;Milk Storage Initiated by:: Allayne Stack RN IBCLC Date initiated:: 12/16/16   Consult Status Consult Status: Follow-up Date: 12/16/16 Follow-up type: In-patient    Theadore Blunck, Elta [place] 12/16/2016, 6:30 AM

## 2016-12-17 LAB — RPR: RPR Ser Ql: NONREACTIVE

## 2016-12-17 MED ORDER — IBUPROFEN 600 MG PO TABS: 600.0000 mg | ORAL_TABLET | Freq: Four times a day (QID) | ORAL | 0 refills | Status: DC | PRN

## 2016-12-17 NOTE — Discharge Summary (Signed)
OB Discharge Summary     Patient Name: Charlotte Mccormick DOB: [birth year] MRN: [id number]  Date of admission: 12/15/2016 Delivering MD: [name] D   Date of discharge: 12/17/2016  Admitting diagnosis: 38 WKS, CTXS Intrauterine pregnancy: [redacted]w[redacted]d     Secondary diagnosis:  Active Problems:   Normal labor  Additional problems: hx PTSD; intramural fibroid     Discharge diagnosis: Term Pregnancy Delivered                                                                                                Post partum procedures:none  Augmentation: Pitocin  Complications: None  Hospital course:  Onset of Labor With Vaginal Delivery     31 y.o. yo [id number] at [redacted]w[redacted]d was admitted in Latent Labor on 12/15/2016. Patient had an uncomplicated labor course as follows:  Membrane Rupture Time/Date: 4:08 PM ,12/15/2016   Intrapartum Procedures: Episiotomy: None [1]                                         Lacerations:  2nd degree [3]  Patient had a delivery of a Viable infant. 12/15/2016  Information for the patient's newborn:  Janiqua, Friscia [[id number]]  Delivery Method: Vaginal, Spontaneous Delivery (Filed from Delivery Summary)    Pateint had an uncomplicated postpartum course.  She is ambulating, tolerating a regular diet, passing flatus, and urinating well. Patient is discharged home in stable condition on 12/17/16.   Physical exam  Vitals:   12/15/16 2150 12/16/16 0150 12/16/16 1000 12/16/16 1757  BP: 112/79 114/78 100/70 118/84  Pulse: (!) 103 (!) 111 91 93  Resp: 20 20 18 19   Temp: 98.1 F (36.7 C) 98.4 F (36.9 C) 97.9 F (36.6 C) 98 F (36.7 C)  TempSrc: Oral Oral Oral Oral  SpO2:      Weight:      Height:       General: alert and cooperative Lochia: appropriate Uterine Fundus: firm Incision: N/A DVT Evaluation: No evidence of DVT seen on physical exam. Labs: Lab Results  Component Value Date   WBC 5.6 12/15/2016   HGB 10.6 (L) 12/15/2016   HCT 33.6  (L) 12/15/2016   MCV 65.8 (L) 12/15/2016   PLT 171 12/15/2016   CMP Latest Ref Rng & Units 12/04/2016  Glucose 65 - 99 mg/dL 85  BUN 6 - 20 mg/dL 4(L)  Creatinine 0.44 - 1.00 mg/dL 0.50  Sodium 135 - 145 mmol/L 139  Potassium 3.5 - 5.1 mmol/L 3.6  Chloride 101 - 111 mmol/L 107  CO2 22 - 32 mmol/L -  Calcium 8.9 - 10.3 mg/dL -  Total Protein 6.5 - 8.1 g/dL -  Total Bilirubin 0.3 - 1.2 mg/dL -  Alkaline Phos 38 - 126 U/L -  AST 15 - 41 U/L -  ALT 14 - 54 U/L -    Discharge instruction: per After Visit Summary and "Baby and Me Booklet".  After visit meds:  Allergies as of 12/17/2016  Reactions   Bee Venom Swelling, Other (See Comments)   Reaction:  Localized swelling    Penicillins Itching, Other (See Comments)   Has patient had a PCN reaction causing immediate rash, facial/tongue/throat swelling, SOB or lightheadedness with hypotension: No Has patient had a PCN reaction causing severe rash involving mucus membranes or skin necrosis: No Has patient had a PCN reaction that required hospitalization No Has patient had a PCN reaction occurring within the last 10 years: Yes If all of the above answers are "NO", then may proceed with Cephalosporin use.   Reglan [metoclopramide] Anxiety      Medication List    STOP taking these medications   acetaminophen 500 MG tablet Commonly known as:  TYLENOL   acyclovir 400 MG tablet Commonly known as:  ZOVIRAX   calcium carbonate 500 MG chewable tablet Commonly known as:  TUMS - dosed in mg elemental calcium   FLINTSTONES GUMMIES COMPLETE Chew   hydrOXYzine 50 MG capsule Commonly known as:  VISTARIL   omeprazole 20 MG capsule Commonly known as:  PRILOSEC   ondansetron 8 MG disintegrating tablet Commonly known as:  ZOFRAN-ODT   polyethylene glycol packet Commonly known as:  MIRALAX / GLYCOLAX   promethazine 25 MG tablet Commonly known as:  PHENERGAN   pyridOXINE 100 MG tablet Commonly known as:  VITAMIN B-6    scopolamine 1 MG/3DAYS Commonly known as:  TRANSDERM-SCOP   zolpidem 10 MG tablet Commonly known as:  AMBIEN     TAKE these medications   buPROPion 300 MG 24 hr tablet Commonly known as:  WELLBUTRIN XL Take 300 mg by mouth daily.   ferrous sulfate 325 (65 FE) MG EC tablet Take 325 mg by mouth daily with breakfast.   ibuprofen 600 MG tablet Commonly known as:  ADVIL,MOTRIN Take 1 tablet (600 mg total) by mouth every 6 (six) hours as needed.   PROVENTIL HFA 108 (90 Base) MCG/ACT inhaler Generic drug:  albuterol INHALE 2 PUFFS INTO THE LUNGS EVERY 6 HOURS AS NEEDED FOR WHEEZING OR SHORTNESS OF BREATH       Diet: routine diet  Activity: Advance as tolerated. Pelvic rest for 6 weeks.   Outpatient follow up:4 weeks Follow up Appt:Future Appointments Date Time Provider Laura  01/20/2017 1:30 PM [name], CNM [facility]   Follow up Visit:No Follow-up on file.  Postpartum contraception: Progesterone only pills and Tubal Ligation (bridge)  Newborn Data: Live born female  Birth Weight: 7 lb 13 oz (3544 g) APGAR: 8, 9  Baby Feeding: Breast Disposition:home with mother   12/17/2016 Serita Grammes, CNM  6:59 AM

## 2016-12-17 NOTE — Discharge Instructions (Signed)
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
## 2016-12-20 ENCOUNTER — Encounter: Payer: Self-pay | Admitting: Obstetrics & Gynecology

## 2016-12-20 ENCOUNTER — Encounter: Payer: Medicaid Other | Admitting: Women's Health

## 2016-12-23 ENCOUNTER — Telehealth: Payer: Self-pay | Admitting: *Deleted

## 2016-12-23 NOTE — Telephone Encounter (Signed)
PP nurse called stating patient scored 20 on pp depression screening. She is scheduled to see her therapist today. No suicidal thoughts, just feeling overwhelmed. Will follow-up with [place] if needed.

## 2016-12-24 ENCOUNTER — Telehealth: Payer: Self-pay | Admitting: Obstetrics & Gynecology

## 2016-12-24 NOTE — Telephone Encounter (Signed)
Patient called stating her stitches are itching and doesn't think they are healing right. Informed patient itching can be a part of the healing process and to try a sitz bath for 10 15 minutes 1-2 times daily.   Also complained of lower extremity edema. States it has not gotten any better despite pushing fluids and keeping them elevated. She has not had any problems with her BP. Wants to be evaluated for the edema but advised to give it more time since edema can occur after delivery d/t fluids received in hospital. Please advise if you recommend evaluation.

## 2016-12-24 NOTE — Telephone Encounter (Signed)
Normal, no eval needed, will get better give it some time

## 2016-12-25 NOTE — Telephone Encounter (Signed)
Informed patient she did not need to be seen for lower extremity swelling and could cancel appointment made for that but states she still wants to come to have stitches checked.

## 2016-12-31 ENCOUNTER — Ambulatory Visit (INDEPENDENT_AMBULATORY_CARE_PROVIDER_SITE_OTHER): Payer: Medicaid Other | Admitting: Advanced Practice Midwife

## 2016-12-31 ENCOUNTER — Encounter: Payer: Self-pay | Admitting: Advanced Practice Midwife

## 2016-12-31 NOTE — Progress Notes (Signed)
[facility] Visit  Patient name: Charlotte Mccormick MRN [id number]  Date of birth: [birth year]  CC & HPI:  Charlotte Mccormick is a 31 y.o. African American female presenting today for laceration check. She had a SVD 2 weeks ago w/a 2nd degree vag lac. She is nervous because she had to have an outpt redo of her episiotomy last pregnancy, wants to make sure that it's healing OK. Worried about a little 'bump'  Pertinent History Reviewed:  Medical & Surgical Hx:   Past Medical History:  Diagnosis Date  . Abnormal uterine bleeding (AUB) 12/12/2015  . Anemia   . Anxiety   . Asthma   . Constipation   . Depression   . Fibroid   . Genital herpes   . GERD (gastroesophageal reflux disease)   . History of chlamydia   . Hyperemesis   . Insomnia   . PTSD (post-traumatic stress disorder)   . Sinusitis   . Vaginal dryness 12/12/2015   Past Surgical History:  Procedure Laterality Date  . COLONOSCOPY N/A 04/08/2016   Procedure: COLONOSCOPY;  Surgeon: Danie Binder, MD;  Location[facility];  Service: Endoscopy;  Laterality: N/A;  10:15 AM  . ESOPHAGOGASTRODUODENOSCOPY N/A 04/08/2016   Procedure: ESOPHAGOGASTRODUODENOSCOPY (EGD);  Surgeon: Danie Binder, MD;  Location: [facility];  Service: Endoscopy;  Laterality: N/A;  . PERINEUM REPAIR     Family History  Problem Relation Age of Onset  . Heart disease Mother   . Hypertension Mother   . Hypertension Father   . Hyperlipidemia Father   . Diabetes Father   . Epilepsy Brother   . Eczema Daughter   . Colon cancer Neg Hx     Current Outpatient Prescriptions:  .  buPROPion (WELLBUTRIN XL) 300 MG 24 hr tablet, Take 300 mg by mouth daily., Disp: , Rfl:  .  ferrous sulfate 325 (65 FE) MG EC tablet, Take 325 mg by mouth daily with breakfast. , Disp: , Rfl:  .  ibuprofen (ADVIL,MOTRIN) 600 MG tablet, Take 1 tablet (600 mg total) by mouth every 6 (six) hours as needed., Disp: 30 tablet, Rfl: 0 .  PROVENTIL HFA 108 (90 Base)  MCG/ACT inhaler, INHALE 2 PUFFS INTO THE LUNGS EVERY 6 HOURS AS NEEDED FOR WHEEZING OR SHORTNESS OF BREATH, Disp: 6.7 g, Rfl: 6 Social History: Reviewed -  reports that she quit smoking about 6 years ago. Her smoking use included Cigarettes. She has a 2.50 pack-year smoking history. She has never used smokeless tobacco.  Review of Systems:   Constitutional: Negative for fever and chills Eyes: Negative for visual disturbances Respiratory: Negative for shortness of breath, dyspnea Cardiovascular: Negative for chest pain or palpitations  Gastrointestinal: Negative for vomiting, diarrhea and constipation; no abdominal pain Genitourinary: Negative for dysuria and urgency, vaginal irritation or itching Musculoskeletal: Negative for back pain, joint pain, myalgias  Neurological: Negative for dizziness and headaches    Objective Findings:    Physical Examination: General appearance - well appearing, and in no distress Mental status - alert, oriented to person, place, and time Chest:  Normal respiratory effort Heart - normal rate and regular rhythm Abdomen:  Soft, nontender Pelvic: healing lac, no bumps, looks fine Musculoskeletal:  Normal range of motion without pain Extremities:  No edema    No results found for this or any previous visit (from the past 24 hour(s)).    Assessment & Plan:  A:   Obs. Lac check P:  Pt reassured.  Return for As scheduled.  CRESENZO-DISHMAN,Marolyn Urschel CNM 12/31/2016 4:35 PM

## 2017-01-03 ENCOUNTER — Ambulatory Visit: Payer: Medicaid Other | Admitting: Obstetrics & Gynecology

## 2017-01-06 ENCOUNTER — Encounter: Payer: Self-pay | Admitting: Obstetrics & Gynecology

## 2017-01-06 ENCOUNTER — Encounter: Payer: Self-pay | Admitting: Women's Health

## 2017-01-06 ENCOUNTER — Other Ambulatory Visit: Payer: Self-pay | Admitting: Obstetrics & Gynecology

## 2017-01-07 ENCOUNTER — Other Ambulatory Visit: Payer: Self-pay | Admitting: Obstetrics & Gynecology

## 2017-01-13 ENCOUNTER — Other Ambulatory Visit: Payer: Self-pay | Admitting: Advanced Practice Midwife

## 2017-01-20 ENCOUNTER — Ambulatory Visit (INDEPENDENT_AMBULATORY_CARE_PROVIDER_SITE_OTHER): Payer: Medicaid Other | Admitting: Women's Health

## 2017-01-20 ENCOUNTER — Encounter: Payer: Self-pay | Admitting: Women's Health

## 2017-01-20 DIAGNOSIS — Z1389 Encounter for screening for other disorder: Secondary | ICD-10-CM | POA: Diagnosis not present

## 2017-01-20 DIAGNOSIS — F4310 Post-traumatic stress disorder, unspecified: Secondary | ICD-10-CM

## 2017-01-20 DIAGNOSIS — F418 Other specified anxiety disorders: Secondary | ICD-10-CM | POA: Diagnosis not present

## 2017-01-20 MED ORDER — SERTRALINE HCL 25 MG PO TABS: 25.0000 mg | ORAL_TABLET | Freq: Every day | ORAL | 3 refills | Status: DC

## 2017-01-20 NOTE — Progress Notes (Signed)
Subjective:    Charlotte Mccormick is a 31 y.o. G25P2002 African American female who presents for a postpartum visit. She is 4 weeks postpartum following a spontaneous vaginal delivery at 38.1 gestational weeks. Anesthesia: epidural. I have fully reviewed the prenatal and intrapartum course. Postpartum course has been uncomplicated. Baby's course has been uncomplicated. Baby is feeding by bottle. Bleeding thin lochia. Bowel function is some constipation, goes about 2x/wk, 'better than what it was', was on Linzess in past from GI doctor. Bladder function is normal. Patient is not sexually active. Last sexual activity: prior to birth of baby. Contraception method is wants IUD. Postpartum depression screening: positive. Score 14. Eating ok, not sleeping well, still finds joy in some things. Denies SI/HI/II. Has PTSD, started retaking prazosin- but stopped b/c made her too sleepy. Was taking Wellbutrin, but stopped about 1wk ago b/c wasn't helping. Wants to try something different. Sees therapist w/ [facility], however she just relocated to office in [place], pt plans to call to schedule appt w/ her. Last pap 01/17/16 and was neg w/ -HRHPV.  The following portions of the patient's history were reviewed and updated as appropriate: allergies, current medications, past medical history, past surgical history and problem list.  Review of Systems Pertinent items are noted in HPI.   Vitals:   01/20/17 1338  BP: 110/70  Pulse: 80  Weight: 154 lb (69.9 kg)   Patient's last menstrual period was 03/23/2016.  Objective:   General:  alert, cooperative and no distress   Breasts:  deferred, no complaints  Lungs: clear to auscultation bilaterally  Heart:  regular rate and rhythm  Abdomen: soft, nontender   Vulva: normal  Vagina: normal vagina  Cervix:  closed  Corpus: Well-involuted  Adnexa:  Non-palpable  Rectal Exam: No hemorrhoids        Assessment:   Postpartum exam 4 wks s/p  SVB Bottlefeeding Depression screening Depression/anxiety PTSD Constipation Contraception counseling   Plan:  Contraception: abstinence until IUD insertin  Rx zoloft 25mg  daily, understands can take 3-4wks to notice improvement Pt to call to schedule appt w/ her therapist Gave info on constipation prevention/relief, if not helping can see if GI MD will re-rx her Linzess Follow up in: 1-2wks  for IUD insertion, then 4wks later for IUD and med f/u, or earlier if needed  Tawnya Crook CNM, [facility] 01/20/2017 1:53 PM

## 2017-01-20 NOTE — Patient Instructions (Addendum)
NO SEX UNTIL AFTER YOU GET YOUR BIRTH CONTROL   Constipation  Drink plenty of fluid, preferably water, throughout the day  Eat foods high in fiber such as fruits, vegetables, and grains  Exercise, such as walking, is a good way to keep your bowels regular  Drink warm fluids, especially warm prune juice, or decaf coffee  Eat a 1/2 cup of real oatmeal (not instant), 1/2 cup applesauce, and 1/2-1 cup warm prune juice every day  If needed, you may take Colace (docusate sodium) stool softener once or twice a day to help keep the stool soft. If you are pregnant, wait until you are out of your first trimester (12-14 weeks of pregnancy)  If you still are having problems with constipation, you may take Miralax once daily as needed to help keep your bowels regular.  If you are pregnant, wait until you are out of your first trimester (12-14 weeks of pregnancy)    Postpartum Depression and Baby Blues The postpartum period begins right after the birth of a baby. During this time, there is often a great amount of joy and excitement. It is also a time of many changes in the life of the parents. Regardless of how many times a mother gives birth, each child brings new challenges and dynamics to the family. It is not unusual to have feelings of excitement along with confusing shifts in moods, emotions, and thoughts. All mothers are at risk of developing postpartum depression or the "baby blues." These mood changes can occur right after giving birth, or they may occur many months after giving birth. The baby blues or postpartum depression can be mild or severe. Additionally, postpartum depression can go away rather quickly, or it can be a long-term condition. What are the causes? Raised hormone levels and the rapid drop in those levels are thought to be a main cause of postpartum depression and the baby blues. A number of hormones change during and after pregnancy. Estrogen and progesterone usually decrease right  after the delivery of your baby. The levels of thyroid hormone and various cortisol steroids also rapidly drop. Other factors that play a role in these mood changes include major life events and genetics. What increases the risk? If you have any of the following risks for the baby blues or postpartum depression, know what symptoms to watch out for during the postpartum period. Risk factors that may increase the likelihood of getting the baby blues or postpartum depression include:  Having a personal or family history of depression.  Having depression while being pregnant.  Having premenstrual mood issues or mood issues related to oral contraceptives.  Having a lot of life stress.  Having marital conflict.  Lacking a social support network.  Having a baby with special needs.  Having health problems, such as diabetes.  What are the signs or symptoms? Symptoms of baby blues include:  Brief changes in mood, such as going from extreme happiness to sadness.  Decreased concentration.  Difficulty sleeping.  Crying spells, tearfulness.  Irritability.  Anxiety.  Symptoms of postpartum depression typically begin within the first month after giving birth. These symptoms include:  Difficulty sleeping or excessive sleepiness.  Marked weight loss.  Agitation.  Feelings of worthlessness.  Lack of interest in activity or food.  Postpartum psychosis is a very serious condition and can be dangerous. Fortunately, it is rare. Displaying any of the following symptoms is cause for immediate medical attention. Symptoms of postpartum psychosis include:  Hallucinations and delusions.  Bizarre or disorganized behavior.  Confusion or disorientation.  How is this diagnosed? A diagnosis is made by an evaluation of your symptoms. There are no medical or lab tests that lead to a diagnosis, but there are various questionnaires that a health care provider may use to identify those with the baby  blues, postpartum depression, or psychosis. Often, a screening tool called the [place] Postnatal Depression Scale is used to diagnose depression in the postpartum period. How is this treated? The baby blues usually goes away on its own in 1-2 weeks. Social support is often all that is needed. You will be encouraged to get adequate sleep and rest. Occasionally, you may be given medicines to help you sleep. Postpartum depression requires treatment because it can last several months or longer if it is not treated. Treatment may include individual or group therapy, medicine, or both to address any social, physiological, and psychological factors that may play a role in the depression. Regular exercise, a healthy diet, rest, and social support may also be strongly recommended. Postpartum psychosis is more serious and needs treatment right away. Hospitalization is often needed. Follow these instructions at home:  Get as much rest as you can. Nap when the baby sleeps.  Exercise regularly. Some women find yoga and walking to be beneficial.  Eat a balanced and nourishing diet.  Do little things that you enjoy. Have a cup of tea, take a bubble bath, read your favorite magazine, or listen to your favorite music.  Avoid alcohol.  Ask for help with household chores, cooking, grocery shopping, or running errands as needed. Do not try to do everything.  Talk to people close to you about how you are feeling. Get support from your partner, family members, friends, or other new moms.  Try to stay positive in how you think. Think about the things you are grateful for.  Do not spend a lot of time alone.  Only take over-the-counter or prescription medicine as directed by your health care provider.  Keep all your postpartum appointments.  Let your health care provider know if you have any concerns. Contact a health care provider if: You are having a reaction to or problems with your medicine. Get help  right away if:  You have suicidal feelings.  You think you may harm the baby or someone else. This information is not intended to replace advice given to you by your health care provider. Make sure you discuss any questions you have with your health care provider. Document Released: 02/22/2004 Document Revised: 10/26/2015 Document Reviewed: 03/01/2013 Elsevier Interactive Patient Education  2017 [employer].

## 2017-01-23 ENCOUNTER — Encounter: Payer: Self-pay | Admitting: Women's Health

## 2017-01-23 ENCOUNTER — Ambulatory Visit: Payer: Medicaid Other | Admitting: Women's Health

## 2017-01-23 ENCOUNTER — Other Ambulatory Visit: Payer: Self-pay | Admitting: Women's Health

## 2017-01-23 ENCOUNTER — Emergency Department (HOSPITAL_COMMUNITY)
Admission: EM | Admit: 2017-01-23 | Discharge: 2017-01-23 | Disposition: A | Discharge status: Home / Self Care | Payer: Medicaid Other | Attending: Emergency Medicine | Admitting: Emergency Medicine

## 2017-01-23 ENCOUNTER — Ambulatory Visit (HOSPITAL_COMMUNITY)
Admission: RE | Admit: 2017-01-23 | Discharge: 2017-01-23 | Disposition: A | Discharge status: Home / Self Care | Payer: Medicaid Other | Attending: Psychiatry | Admitting: Psychiatry

## 2017-01-23 ENCOUNTER — Telehealth: Payer: Self-pay | Admitting: Women's Health

## 2017-01-23 DIAGNOSIS — Z5321 Procedure and treatment not carried out due to patient leaving prior to being seen by health care provider: Secondary | ICD-10-CM | POA: Insufficient documentation

## 2017-01-23 DIAGNOSIS — F329 Major depressive disorder, single episode, unspecified: Secondary | ICD-10-CM | POA: Diagnosis present

## 2017-01-23 DIAGNOSIS — F918 Other conduct disorders: Secondary | ICD-10-CM | POA: Diagnosis present

## 2017-01-23 DIAGNOSIS — F418 Other specified anxiety disorders: Secondary | ICD-10-CM | POA: Insufficient documentation

## 2017-01-23 NOTE — H&P (Signed)
Behavioral Health Medical Screening Exam  Charlotte Mccormick is an 31 y.o. female who arrived voluntarily to [facility] unaccompanied with complaints of depression. Patient requesting medication management, stating that her Zoloft she started this Monday, 01/20/17 is not effective yet. Patient stated she was on Wellbutrin prior to the change.  Patient currently denies any SI/HI/VAH.  Total Time spent with patient: 30 minutes  Psychiatric Specialty Exam: Physical Exam  Constitutional: She is oriented to person, place, and time. She appears well-developed and well-nourished.  HENT:  Head: Normocephalic and atraumatic.  Eyes: Pupils are equal, round, and reactive to light.  Neck: Normal range of motion. Neck supple.  Cardiovascular: Normal rate and regular rhythm.   Respiratory: Effort normal and breath sounds normal.  GI: Soft. Bowel sounds are normal.  Genitourinary:  Genitourinary Comments: Deferred   Musculoskeletal: Normal range of motion.  Neurological: She is alert and oriented to person, place, and time.  Skin: Skin is warm and dry.    Review of Systems  Psychiatric/Behavioral: Positive for depression. Negative for hallucinations, memory loss, substance abuse and suicidal ideas. The patient is nervous/anxious. The patient does not have insomnia.   All other systems reviewed and are negative.   Blood pressure 124/84, pulse 71, temperature 98.6 F (37 C), temperature source Oral, resp. rate 18, SpO2 100 %, not currently breastfeeding.There is no height or weight on file to calculate BMI.  General Appearance: unremarkable  Eye Contact:  Good  Speech:  Clear and Coherent and Normal Rate  Volume:  Normal  Mood:  Anxious and Depressed  Affect:  Blunt  Thought Process:  Coherent and Goal Directed  Orientation:  Full (Time, Place, and Person)  Thought Content:  WDL and Logical  Suicidal Thoughts:  No  Homicidal Thoughts:  No  Memory:  Immediate;   Good Recent;   Good Remote;   Fair   Judgement:  Good  Insight:  Good and Present  Psychomotor Activity:  Normal  Concentration: Concentration: Good and Attention Span: Good  Recall:  Good  Fund of Knowledge:Good  Language: Good  Akathisia:  Negative  Handed:  Right  AIMS (if indicated):     Assets:  Communication Skills Desire for Improvement Financial Resources/Insurance Housing Intimacy Leisure Time Physical Health Resilience Social Support  Sleep:       Musculoskeletal: Strength & Muscle Tone: within normal limits Gait & Station: normal Patient leans: N/A  Blood pressure 124/84, pulse 71, temperature 98.6 F (37 C), temperature source Oral, resp. rate 18, SpO2 100 %, not currently breastfeeding.  Recommendations:  Based on my evaluation the patient does not appear to have an emergency medical condition.  Will follow up with OP resources.  Vicenta Aly, NP 01/23/2017, 7:20 PM

## 2017-01-23 NOTE — ED Triage Notes (Addendum)
Pt reports "i cant handle life right now." pt sad, flat affect. Pt reports delivered second child on July 15th. Pt reports was seen by OB on 8/20 and reports was started on zoloft. 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

## 2017-01-23 NOTE — ED Notes (Signed)
Pt entered triage room and reported " I just need to see a psychiatrist". My doctor sent me here and expected me to be seen by ACT team. Pt informed of emergency department process and told that would be the next patient brought back barring an emergency. Pt reports "i am not going to talk to all of you, I just want to talk to a psychiatrist, how do you know I'm not critical." "you are taking physical emergencies over a mental one." pt informed "is there a Herbalist here, I don't understand you." ED Charge Nurse aware. Pt agitated and waiting in room behind triage.

## 2017-01-23 NOTE — Progress Notes (Signed)
Charlotte Mccormick e-mailed today via mychart with the below message:   "Charlotte Mccormick i called after hours but lady couldn't understand me....i tried call a crisisline number given by Charlotte Mccormick from wic... Everything been building up .mi took postpartum test tonight and scored 27... I started throwing up again and was gonna take phenergan and was trying get ok.. myb therapist Charlotte Mccormick new office cant get appt with her for 2 weeks and i [facility]...im really overwhelmed..im tired..no one understands me..tired feeling like im worst mom in world . I feel like im reaching a breaking point.Marland Kitchenim making my husband not go to work tomorrow so i can try get some help"   I discussed w/ Charlotte Mccormick as I had just seen the pt 3d prior on Monday 8/20 for her 5wk postpartum visit at which time she scored a 14 on her EPDS, denied SI/HI/II, had recently resumed her prazosin for PTSD, had taken herself off wellbutrin for depression b/c it wasn't working, so she was started on zoloft 25mg  daily and notified it could take 3-4wks before she started noticing improvement. She had been seeing counselor at [facility], however counselor moved to office in [place] and pt couldn't get appt for 2 wks, she was to schedule appt with her. Per my discussion w/ Charlotte Mccormick, he said there was really nothing else we could offer her here, and she needed to be evaluated by behavioral health, and recommended she go to [facility] for evaluation.  I tried to call pt, left a message to return my call. Emailed her via Pharmacist, community about Charlotte Mccormick recommendations. I called her again, at which time she was in ER waiting room waiting to be seen, denied SI/HI/II. Then pt called our office stating she was told at ER they didn't have an ACT team (she had been told to ask for this, as this is what the behavioral health team used to be called). Then pt showed up at our front office stating she was told they wouldn't evaluate her there.  Per ER notes, pt was  very agitated and left without being seen by MD.  Charlotte Mccormick in [place], states pt can go to ED to be evaluated for need for inpatient therapy vs. Intensive outpatient therapy. Pt states she will go to [facility] right now for evaluation by behavioral health.   Charlotte Mccormick, CNM, [facility] 01/23/2017 5:08 PM

## 2017-01-23 NOTE — ED Notes (Signed)
Per ED Charge Nurse, talked with patient and attempted to explain delay. Pt remains agitated and reported was leaving. Pt denies HI/SI.

## 2017-01-23 NOTE — ED Notes (Signed)
Dr. Dayna Barker notified of patient. Per verbal order to place medical clearance labs while pt waiting for room.

## 2017-01-23 NOTE — Telephone Encounter (Signed)
LM for pt to return call.  [name], CNM, WHNP-BC 01/23/2017 1:28 PM
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
## 2017-02-02 ENCOUNTER — Other Ambulatory Visit: Payer: Self-pay | Admitting: Obstetrics & Gynecology

## 2017-02-05 ENCOUNTER — Other Ambulatory Visit: Payer: Self-pay | Admitting: Women's Health

## 2017-02-05 ENCOUNTER — Encounter: Payer: Self-pay | Admitting: Women's Health

## 2017-02-05 ENCOUNTER — Ambulatory Visit (INDEPENDENT_AMBULATORY_CARE_PROVIDER_SITE_OTHER): Payer: Medicaid Other | Admitting: Women's Health

## 2017-02-05 VITALS — BP 112/88 | HR 76 | Ht 62.0 in | Wt 159.0 lb

## 2017-02-05 DIAGNOSIS — Z3009 Encounter for other general counseling and advice on contraception: Secondary | ICD-10-CM

## 2017-02-05 DIAGNOSIS — Z309 Encounter for contraceptive management, unspecified: Secondary | ICD-10-CM

## 2017-02-05 DIAGNOSIS — Z01419 Encounter for gynecological examination (general) (routine) without abnormal findings: Secondary | ICD-10-CM

## 2017-02-05 DIAGNOSIS — Z113 Encounter for screening for infections with a predominantly sexual mode of transmission: Secondary | ICD-10-CM

## 2017-02-05 NOTE — Progress Notes (Signed)
Subjective:   Charlotte Mccormick is a 31 y.o. [id number] African American female 7wks s/p SVB, initially here for IUD placement, however her [facility]aid just recently flipped back to [facility], and has been >63yr since last physical, so she will have physical today and IUD tomorrow. PP visit 8/20, was started on zoloft for dep/anx, 8/23 she felt like she was worsening, went to [facility], left w/o being seen, then went to [facility]- left w/o being seen d/t 5hr wait, then went to [facility] and had face to face eval by NP, who did not feel she needed inpatient therapy and recommended continuing outpatient therapy. [place] weekly. Feels she is 'much better' than she was. Feels zoloft is finally starting to help. Denies SI/HI/II.  No LMP recorded.    Current complaints: none  Social History: Sexual: heterosexual Marital Status: married Living situation: with family Tobacco/alcohol: none Illicit drugs: no history of illicit drug use  The following portions of the patient's history were reviewed and updated as appropriate: allergies, current medications, past family history, past medical history, past social history, past surgical history and problem list.  Past Medical History Past Medical History:  Diagnosis Date  . Abnormal uterine bleeding (AUB) 12/12/2015  . Anemia   . Anxiety   . Asthma   . Constipation   . Depression   . Fibroid   . Genital herpes   . GERD (gastroesophageal reflux disease)   . History of chlamydia   . Hyperemesis   . Insomnia   . PTSD (post-traumatic stress disorder)   . Sinusitis   . Vaginal dryness 12/12/2015    Past Surgical History Past Surgical History:  Procedure Laterality Date  . COLONOSCOPY N/A 04/08/2016   Procedure: COLONOSCOPY;  Surgeon: Danie Binder, MD;  [facility];  Service: Endoscopy;  Laterality: N/A;  10:15 AM  . ESOPHAGOGASTRODUODENOSCOPY N/A 04/08/2016   Procedure: ESOPHAGOGASTRODUODENOSCOPY (EGD);  Surgeon: Danie Binder, MD;   [facility];  Service: Endoscopy;  Laterality: N/A;  . PERINEUM REPAIR      Gynecologic History [id number]  No LMP recorded. Contraception: abstinence until IUD, hasn't had sex since baby born Last Pap: 01/17/16. Results were: normal Last mammogram: never. Results were: n/a  Obstetric History OB History  Gravida Para Term Preterm AB Living  2 2 2     2   SAB TAB Ectopic Multiple Live Births        0 2    # Outcome Date GA Lbr Len/2nd Weight Sex Delivery Anes PTL Lv  2 Term 12/15/16 [redacted]w[redacted]d 14:41 / 00:07 7 lb 13 oz (3.544 kg) M Vag-Spont EPI  LIV  1 Term 04/02/12 [redacted]w[redacted]d  7 lb 2 oz (3.232 kg) F Vag-Spont EPI N LIV      Current Medications Current Outpatient Prescriptions on File Prior to Visit  Medication Sig Dispense Refill  . ferrous sulfate 325 (65 FE) MG EC tablet Take 325 mg by mouth daily with breakfast.     . prazosin (MINIPRESS) 1 MG capsule TAKE 1 CAPSULE BY MOUTH EVERY NIGHT AT BEDTIME 30 capsule 3  . PROAIR HFA 108 (90 Base) MCG/ACT inhaler INHALE 2 PUFFS INTO THE LUGNS EVERY 6 HOURS AS NEEDED FOR WHEEZING OR SHORTNESS OF BREATH 8.5 g 0  . PROVENTIL HFA 108 (90 Base) MCG/ACT inhaler INHALE 2 PUFFS INTO THE LUNGS EVERY 6 HOURS AS NEEDED FOR WHEEZING OR SHORTNESS OF BREATH 6.7 g 6  . sertraline (ZOLOFT) 25 MG tablet Take 1 tablet (  25 mg total) by mouth daily. 30 tablet 3  . ibuprofen (ADVIL,MOTRIN) 600 MG tablet Take 1 tablet (600 mg total) by mouth every 6 (six) hours as needed. (Patient not taking: Reported on 02/05/2017) 30 tablet 0   No current facility-administered medications on file prior to visit.     Review of Systems Patient denies any headaches, blurred vision, shortness of breath, chest pain, abdominal pain, problems with bowel movements, urination, or intercourse.  Objective:  BP 112/88 (BP Location: Left Arm, Patient Position: Sitting, Cuff Size: Normal)   Pulse 76   Ht 5\' 2"  (1.575 m)   Wt 159 lb (72.1 kg)   Breastfeeding? No   BMI 29.08 kg/m   Physical Exam  General:  Well developed, well nourished, no acute distress. She is alert and oriented x3. Skin:  Warm and dry Neck:  Midline trachea, no thyromegaly or nodules Cardiovascular: Regular rate and rhythm, no murmur heard Lungs:  Effort normal, all lung fields clear to auscultation bilaterally Breasts:  No dominant palpable mass, retraction, or nipple discharge Abdomen:  Soft, non tender, no hepatosplenomegaly or masses Pelvic:  External genitalia is normal in appearance.  The vagina is normal in appearance. She is on her period right now. The cervix is bulbous, no CMT.  Thin prep pap is not done. Uterus is felt to be normal size, shape, and contour.  No adnexal masses or tenderness noted. Extremities:  No swelling or varicosities noted Psych:  She has a normal mood and affect  Assessment:   Healthy well-woman FP Mcaid exam STD screen Dep/anx, PTSD  Plan:  GC/CT, HIV, RPR today F/U 1d as scheduled for IUD insertion, or sooner if needed Abstinence until after IUD insertion Mammogram @31yo  or sooner if problems Colonoscopy @31yo  or sooner if problems Continue zoloft and weekly visits w/ [facility]  Wells Guiles [place], [facility] 02/05/2017 12:17 PM

## 2017-02-05 NOTE — Patient Instructions (Signed)
NO SEX UNTIL AFTER YOU GET YOUR BIRTH CONTROL   Levonorgestrel intrauterine device (IUD) What is this medicine? LEVONORGESTREL IUD (LEE voe nor jes trel) is a contraceptive (birth control) device. The device is placed inside the uterus by a healthcare professional. It is used to prevent pregnancy. This device can also be used to treat heavy bleeding that occurs during your period. This medicine may be used for other purposes; ask your health care provider or pharmacist if you have questions. COMMON BRAND NAME(S): Kyleena, LILETTA, Mirena, Skyla What should I tell my health care provider before I take this medicine? They need to know if you have any of these conditions: -abnormal Pap smear -cancer of the breast, uterus, or cervix -diabetes -endometritis -genital or pelvic infection now or in the past -have more than one sexual partner or your partner has more than one partner -heart disease -history of an ectopic or tubal pregnancy -immune system problems -IUD in place -liver disease or tumor -problems with blood clots or take blood-thinners -seizures -use intravenous drugs -uterus of unusual shape -vaginal bleeding that has not been explained -an unusual or allergic reaction to levonorgestrel, other hormones, silicone, or polyethylene, medicines, foods, dyes, or preservatives -pregnant or trying to get pregnant -breast-feeding How should I use this medicine? This device is placed inside the uterus by a health care professional. Talk to your pediatrician regarding the use of this medicine in children. Special care may be needed. Overdosage: If you think you have taken too much of this medicine contact a poison control center or emergency room at once. NOTE: This medicine is only for you. Do not share this medicine with others. What if I miss a dose? This does not apply. Depending on the brand of device you have inserted, the device will need to be replaced every 3 to 5 years if you  wish to continue using this type of birth control. What may interact with this medicine? Do not take this medicine with any of the following medications: -amprenavir -bosentan -fosamprenavir This medicine may also interact with the following medications: -aprepitant -armodafinil -barbiturate medicines for inducing sleep or treating seizures -bexarotene -boceprevir -griseofulvin -medicines to treat seizures like carbamazepine, ethotoin, felbamate, oxcarbazepine, phenytoin, topiramate -modafinil -pioglitazone -rifabutin -rifampin -rifapentine -some medicines to treat HIV infection like atazanavir, efavirenz, indinavir, lopinavir, nelfinavir, tipranavir, ritonavir -St. John's wort -warfarin This list may not describe all possible interactions. Give your health care provider a list of all the medicines, herbs, non-prescription drugs, or dietary supplements you use. Also tell them if you smoke, drink alcohol, or use illegal drugs. Some items may interact with your medicine. What should I watch for while using this medicine? Visit your doctor or health care professional for regular check ups. See your doctor if you or your partner has sexual contact with others, becomes HIV positive, or gets a sexual transmitted disease. This product does not protect you against HIV infection (AIDS) or other sexually transmitted diseases. You can check the placement of the IUD yourself by reaching up to the top of your vagina with clean fingers to feel the threads. Do not pull on the threads. It is a good habit to check placement after each menstrual period. Call your doctor right away if you feel more of the IUD than just the threads or if you cannot feel the threads at all. The IUD may come out by itself. You may become pregnant if the device comes out. If you notice that the IUD has come out   use a backup birth control method like condoms and call your health care provider. Using tampons will not change the  position of the IUD and are okay to use during your period. This IUD can be safely scanned with magnetic resonance imaging (MRI) only under specific conditions. Before you have an MRI, tell your healthcare provider that you have an IUD in place, and which type of IUD you have in place. What side effects may I notice from receiving this medicine? Side effects that you should report to your doctor or health care professional as soon as possible: -allergic reactions like skin rash, itching or hives, swelling of the face, lips, or tongue -fever, flu-like symptoms -genital sores -high blood pressure -no menstrual period for 6 weeks during use -pain, swelling, warmth in the leg -pelvic pain or tenderness -severe or sudden headache -signs of pregnancy -stomach cramping -sudden shortness of breath -trouble with balance, talking, or walking -unusual vaginal bleeding, discharge -yellowing of the eyes or skin Side effects that usually do not require medical attention (report to your doctor or health care professional if they continue or are bothersome): -acne -breast pain -change in sex drive or performance -changes in weight -cramping, dizziness, or faintness while the device is being inserted -headache -irregular menstrual bleeding within first 3 to 6 months of use -nausea This list may not describe all possible side effects. Call your doctor for medical advice about side effects. You may report side effects to FDA at 1-800-FDA-1088. Where should I keep my medicine? This does not apply. NOTE: This sheet is a summary. It may not cover all possible information. If you have questions about this medicine, talk to your doctor, pharmacist, or health care provider.  2018 Elsevier/Gold Standard (2016-03-01 14:14:56)  

## 2017-02-06 ENCOUNTER — Ambulatory Visit (INDEPENDENT_AMBULATORY_CARE_PROVIDER_SITE_OTHER): Payer: Medicaid Other | Admitting: Advanced Practice Midwife

## 2017-02-06 ENCOUNTER — Encounter: Payer: Self-pay | Admitting: Advanced Practice Midwife

## 2017-02-06 VITALS — BP 108/80 | HR 75 | Ht 62.0 in | Wt 158.0 lb

## 2017-02-06 DIAGNOSIS — Z30430 Encounter for insertion of intrauterine contraceptive device: Secondary | ICD-10-CM

## 2017-02-06 DIAGNOSIS — Z3202 Encounter for pregnancy test, result negative: Secondary | ICD-10-CM | POA: Diagnosis not present

## 2017-02-06 LAB — HIV ANTIBODY (ROUTINE TESTING W REFLEX): HIV SCREEN 4TH GENERATION: NONREACTIVE

## 2017-02-06 LAB — RPR: RPR: NONREACTIVE

## 2017-02-06 LAB — POCT URINE PREGNANCY: PREG TEST UR: NEGATIVE

## 2017-02-06 MED ORDER — LEVONORGESTREL 20 MCG/24HR IU IUD
INTRAUTERINE_SYSTEM | Freq: Once | INTRAUTERINE | Status: AC
  Administered 2017-02-06: 14:00:00 via INTRAUTERINE

## 2017-02-06 NOTE — Progress Notes (Signed)
Charlotte Mccormick is a 31 y.o. year old  female   who presents for placement of a mirena IUD. pregnancy test today is negative.    The risks and benefits of the method and placement have been thouroughly reviewed with the patient and all questions were answered.  Specifically the patient is aware of failure rate of 06/998, expulsion of the IUD and of possible perforation.  The patient is aware of irregular bleeding due to the method and understands the incidence of irregular bleeding diminishes with time.  Time out was performed.  A Graves speculum was placed.  The cervix was prepped using Betadine. The uterus was found to be neutral and it sounded to 8 cm.  The cervix was grasped with a tenaculum and the IUD was inserted to 8 cm.  It was pulled back 1 cm and the IUD was disengaged.  The strings were trimmed to 3 cm.  Sonogram was performed and the proper placement of the IUD was verified.  The patient was instructed on signs and symptoms of infection and to check for the strings after each menses or each month.  The patient is to refrain from intercourse for 3 days.  The patient is scheduled for a return appointment after her first menses or 4 weeks.  CRESENZO-DISHMAN,Ebany Bowermaster 02/06/2017 12:22 PM

## 2017-02-07 LAB — GC/CHLAMYDIA PROBE AMP
Chlamydia trachomatis, NAA: NEGATIVE
Neisseria gonorrhoeae by PCR: NEGATIVE

## 2017-02-11 ENCOUNTER — Encounter: Payer: Self-pay | Admitting: Gastroenterology

## 2017-02-12 ENCOUNTER — Telehealth (HOSPITAL_COMMUNITY): Payer: Self-pay | Admitting: General Practice

## 2017-02-25 ENCOUNTER — Telehealth: Payer: Self-pay | Admitting: *Deleted

## 2017-02-25 ENCOUNTER — Encounter: Payer: Self-pay | Admitting: Advanced Practice Midwife

## 2017-02-25 ENCOUNTER — Ambulatory Visit (INDEPENDENT_AMBULATORY_CARE_PROVIDER_SITE_OTHER): Payer: Medicaid Other | Admitting: Advanced Practice Midwife

## 2017-02-25 VITALS — BP 110/80 | HR 77 | Wt 165.0 lb

## 2017-02-25 DIAGNOSIS — Z975 Presence of (intrauterine) contraceptive device: Secondary | ICD-10-CM | POA: Diagnosis not present

## 2017-02-25 DIAGNOSIS — N921 Excessive and frequent menstruation with irregular cycle: Secondary | ICD-10-CM

## 2017-02-25 MED ORDER — MEGESTROL ACETATE 40 MG PO TABS: ORAL_TABLET | ORAL | 3 refills | Status: DC

## 2017-02-25 MED ORDER — KETOROLAC TROMETHAMINE 10 MG PO TABS: 10.0000 mg | ORAL_TABLET | Freq: Four times a day (QID) | ORAL | 0 refills | Status: DC | PRN

## 2017-02-25 NOTE — Progress Notes (Signed)
History:  31 y.o. [id number] here today for today for IUD string check; Mirena IUD was placed  9/5.  Marland Kitchenspoke w/RN this Am:  She is cramping really bad. Started last pm. Ibuprofen not helping. + bleeding since IUD placed. Pt states she can't feel IUD strings  The following portions of the patient's history were reviewed and updated as appropriate: allergies, current medications, past family history, past medical history, past social history, past surgical history and problem list.  Review of Systems:   Constitutional: Negative for fever and chills Eyes: Negative for visual disturbances Respiratory: Negative for shortness of breath, dyspnea Cardiovascular: Negative for chest pain or palpitations  Gastrointestinal: Negative for vomiting, diarrhea and constipation Genitourinary: Negative for dysuria and urgency Musculoskeletal: Negative for back pain, joint pain, myalgias  Neurological: Negative for dizziness and headaches    Objective:  Physical Exam Blood pressure 110/80, pulse 77, weight 165 lb (74.8 kg), not currently breastfeeding. Gen: NAD Abd: Soft, nontender and nondistended Pelvic:pt refused spec exam. Strings palpable.  Bedside US confirmed fundal placement of IUD. Some dark red blood on US probe  Assessment & Plan:  Normal IUD check. Bleeding since IUD insertion/cramping since last night. Properly placed IUD w/normal strings.  Try megace/toradol.  If those don't work, only thing I know to do it remove it.

## 2017-02-25 NOTE — Telephone Encounter (Signed)
Spoke with pt. Pt got IUD placed 9/5. She is cramping really bad. Started last pm. Ibuprofen not helping. + bleeding since IUD placed. Pt states she can't feel IUD strings. Appt scheduled for pt to be seen. [place]

## 2017-02-28 ENCOUNTER — Encounter: Payer: Self-pay | Admitting: Women's Health

## 2017-03-04 ENCOUNTER — Encounter: Payer: Self-pay | Admitting: *Deleted

## 2017-03-06 ENCOUNTER — Ambulatory Visit (INDEPENDENT_AMBULATORY_CARE_PROVIDER_SITE_OTHER): Payer: Medicaid Other | Admitting: Advanced Practice Midwife

## 2017-03-06 VITALS — BP 110/60 | HR 84 | Ht 62.0 in | Wt 165.0 lb

## 2017-03-06 DIAGNOSIS — Z30432 Encounter for removal of intrauterine contraceptive device: Secondary | ICD-10-CM | POA: Diagnosis not present

## 2017-03-06 MED ORDER — NORGESTIM-ETH ESTRAD TRIPHASIC 0.18/0.215/0.25 MG-25 MCG PO TABS: 1.0000 | ORAL_TABLET | Freq: Every day | ORAL | 11 refills | Status: DC

## 2017-03-06 NOTE — Progress Notes (Signed)
  Charlotte Mccormick 31 y.o.  Vitals:   03/06/17 1145  BP: 110/60  Pulse: 84   Past Medical History:  Diagnosis Date  . Abnormal uterine bleeding (AUB) 12/12/2015  . Anemia   . Anxiety   . Asthma   . Constipation   . Depression   . Fibroid   . Genital herpes   . GERD (gastroesophageal reflux disease)   . History of chlamydia   . Hyperemesis   . IBS (irritable bowel syndrome)   . Insomnia   . PTSD (post-traumatic stress disorder)   . Sinusitis   . Vaginal dryness 12/12/2015   Past Surgical History:  Procedure Laterality Date  . COLONOSCOPY N/A 04/08/2016   Procedure: COLONOSCOPY;  Surgeon: Danie Binder, MD;  Location: [facility];  Service: Endoscopy;  Laterality: N/A;  10:15 AM  . ESOPHAGOGASTRODUODENOSCOPY N/A 04/08/2016   Procedure: ESOPHAGOGASTRODUODENOSCOPY (EGD);  Surgeon: Danie Binder, MD;  Location: [facility];  Service: Endoscopy;  Laterality: N/A;  . PERINEUM REPAIR     family history includes Cancer in her father; Diabetes in her father; Eczema in her daughter; Epilepsy in her brother; Heart disease in her mother; Hyperlipidemia in her father; Hypertension in her father and mother.  Current Outpatient Prescriptions:  .  buPROPion (WELLBUTRIN SR) 100 MG 12 hr tablet, Take 100 mg by mouth 2 (two) times daily., Disp: , Rfl:  .  ibuprofen (ADVIL,MOTRIN) 600 MG tablet, Take 1 tablet (600 mg total) by mouth every 6 (six) hours as needed., Disp: 30 tablet, Rfl: 0 .  sertraline (ZOLOFT) 25 MG tablet, Take 1 tablet (25 mg total) by mouth daily. (Patient taking differently: Take 50 mg by mouth daily. ), Disp: 30 tablet, Rfl: 3 .  Calcium Carbonate Antacid (TUMS PO), Take by mouth 3 (three) times daily., Disp: , Rfl:  .  ferrous sulfate 325 (65 FE) MG EC tablet, Take 325 mg by mouth daily with breakfast. , Disp: , Rfl:  .  ketorolac (TORADOL) 10 MG tablet, Take 1 tablet (10 mg total) by mouth every 6 (six) hours as needed., Disp: 30 tablet, Rfl: 0 .  megestrol  (MEGACE) 40 MG tablet, Take 3/day (at the same time) for 5 days; 2/day for 5 days, then 1/day PO prn bleeding, Disp: 60 tablet, Rfl: 3 .  Norgestimate-Ethinyl Estradiol Triphasic 0.18/0.215/0.25 MG-25 MCG tab, Take 1 tablet by mouth daily., Disp: 1 Package, Rfl: 11 .  prazosin (MINIPRESS) 1 MG capsule, TAKE 1 CAPSULE BY MOUTH EVERY NIGHT AT BEDTIME, Disp: 30 capsule, Rfl: 3 .  PROAIR HFA 108 (90 Base) MCG/ACT inhaler, INHALE 2 PUFFS INTO THE LUGNS EVERY 6 HOURS AS NEEDED FOR WHEEZING OR SHORTNESS OF BREATH, Disp: 8.5 g, Rfl: 0 .  PROVENTIL HFA 108 (90 Base) MCG/ACT inhaler, INHALE 2 PUFFS INTO THE LUNGS EVERY 6 HOURS AS NEEDED FOR WHEEZING OR SHORTNESS OF BREATH, Disp: 6.7 g, Rfl: 6    Here for IUD removal.  She had the Mirena IUD placed a few m onths ago and would like it removed because she cramps. Checked placement last week and everyhting looked fine. Her plans for future contraception are COCs, requests Ortho[facility] Cycl[facility].  A graves speculum was placed, and the strings were visible.  They were grasped with a curved Claiborne Billings and the IUD easily removed.  Pt given IUD removal f/u instructions.

## 2017-04-11 ENCOUNTER — Encounter: Payer: Self-pay | Admitting: Advanced Practice Midwife

## 2017-06-03 NOTE — L&D Delivery Note (Addendum)
Patient: Charlotte Mccormick MRN: [id number]  GBS status: Negative Patient is a 31 y.o. now G3P3003 s/p NSVD at [redacted]w[redacted]d, who was admitted for spontaneous labor. SROM 0h 33m prior to delivery with clear fluid.    Delivery Note At 3:12 PM a viable female was delivered via Vaginal, Spontaneous (Presentation: Left OA).  APGAR: 9,9; weight pending.   Placenta status: Intact.  Cord: 3 vessel, intact with the following complications: nuchal x1.   Anesthesia:  Epidural Episiotomy: None Lacerations: 1st degree peritoneal  Suture Repair: 3.0 monocryl  Est. Blood Loss (mL): 150  Mom to postpartum.  Baby to Couplet care / Skin to Skin.  Head delivered left OA. Nuchal cord x1 present. Shoulder and body delivered in usual fashion. Infant with spontaneous cry, placed on mother's abdomen, dried and bulb suctioned. Cord clamped x 2 after 1-minute delay, and cut by family member. Cord blood drawn. Placenta delivered spontaneously with gentle cord traction. Fundus firm with massage and Pitocin. Perineum inspected and found to have wide first degree peritoneal laceration, which was repaired with 3.0 monocryl with good hemostasis achieved.  [place] PGY-1  03/07/2018, 3:46 PM  OB FELLOW DELIVERY ATTESTATION  I was gloved and present for the delivery in its entirety, and I agree with the above resident's note.    Phill Myron, D.O. OB Fellow  03/07/2018, 4:03 PM

## 2017-06-25 ENCOUNTER — Encounter: Payer: Self-pay | Admitting: Women's Health

## 2017-06-26 ENCOUNTER — Other Ambulatory Visit: Payer: Self-pay | Admitting: Women's Health

## 2017-06-26 MED ORDER — SERTRALINE HCL 50 MG PO TABS: 50.0000 mg | ORAL_TABLET | Freq: Every day | ORAL | 11 refills | Status: DC

## 2017-06-26 MED ORDER — BUPROPION HCL ER (SR) 100 MG PO TB12: 100.0000 mg | ORAL_TABLET | Freq: Two times a day (BID) | ORAL | 11 refills | Status: DC

## 2017-07-22 DIAGNOSIS — Z029 Encounter for administrative examinations, unspecified: Secondary | ICD-10-CM

## 2017-07-24 ENCOUNTER — Encounter: Payer: Self-pay | Admitting: Adult Health

## 2017-07-24 ENCOUNTER — Ambulatory Visit: Payer: Self-pay | Admitting: Adult Health

## 2017-07-24 ENCOUNTER — Other Ambulatory Visit: Payer: Self-pay

## 2017-07-24 VITALS — BP 122/78 | HR 97 | Ht 62.0 in | Wt 177.0 lb

## 2017-07-24 DIAGNOSIS — Z3201 Encounter for pregnancy test, result positive: Secondary | ICD-10-CM

## 2017-07-24 DIAGNOSIS — O3680X0 Pregnancy with inconclusive fetal viability, not applicable or unspecified: Secondary | ICD-10-CM

## 2017-07-24 DIAGNOSIS — O219 Vomiting of pregnancy, unspecified: Secondary | ICD-10-CM

## 2017-07-24 DIAGNOSIS — Z3A01 Less than 8 weeks gestation of pregnancy: Secondary | ICD-10-CM

## 2017-07-24 DIAGNOSIS — N926 Irregular menstruation, unspecified: Secondary | ICD-10-CM

## 2017-07-24 DIAGNOSIS — R112 Nausea with vomiting, unspecified: Secondary | ICD-10-CM

## 2017-07-24 LAB — POCT URINE PREGNANCY: Preg Test, Ur: POSITIVE — AB

## 2017-07-24 MED ORDER — DOXYLAMINE-PYRIDOXINE 10-10 MG PO TBEC: DELAYED_RELEASE_TABLET | ORAL | 3 refills | Status: DC

## 2017-07-24 MED ORDER — ONE-A-DAY WOMENS PRENATAL 1 28-0.8-235 MG PO CAPS: 1.0000 | ORAL_CAPSULE | Freq: Every day | ORAL | Status: DC

## 2017-07-24 NOTE — Patient Instructions (Addendum)
First Trimester of Pregnancy The first trimester of pregnancy is from week 1 until the end of week 13 (months 1 through 3). A week after a sperm fertilizes an egg, the egg will implant on the wall of the uterus. This embryo will begin to develop into a baby. Genes from you and your partner will form the baby. The female genes will determine whether the baby will be a boy or a girl. At 6-8 weeks, the eyes and face will be formed, and the heartbeat can be seen on ultrasound. At the end of 12 weeks, all the baby's organs will be formed. Now that you are pregnant, you will want to do everything you can to have a healthy baby. Two of the most important things are to get good prenatal care and to follow your health care provider's instructions. Prenatal care is all the medical care you receive before the baby's birth. This care will help prevent, find, and treat any problems during the pregnancy and childbirth. Body changes during your first trimester Your body goes through many changes during pregnancy. The changes vary from woman to woman.  You may gain or lose a couple of pounds at first.  You may feel sick to your stomach (nauseous) and you may throw up (vomit). If the vomiting is uncontrollable, call your health care provider.  You may tire easily.  You may develop headaches that can be relieved by medicines. All medicines should be approved by your health care provider.  You may urinate more often. Painful urination may mean you have a bladder infection.  You may develop heartburn as a result of your pregnancy.  You may develop constipation because certain hormones are causing the muscles that push stool through your intestines to slow down.  You may develop hemorrhoids or swollen veins (varicose veins).  Your breasts may begin to grow larger and become tender. Your nipples may stick out more, and the tissue that surrounds them (areola) may become darker.  Your gums may bleed and may be  sensitive to brushing and flossing.  Dark spots or blotches (chloasma, mask of pregnancy) may develop on your face. This will likely fade after the baby is born.  Your menstrual periods will stop.  You may have a loss of appetite.  You may develop cravings for certain kinds of food.  You may have changes in your emotions from day to day, such as being excited to be pregnant or being concerned that something may go wrong with the pregnancy and baby.  You may have more vivid and strange dreams.  You may have changes in your hair. These can include thickening of your hair, rapid growth, and changes in texture. Some women also have hair loss during or after pregnancy, or hair that feels dry or thin. Your hair will most likely return to normal after your baby is born.  What to expect at prenatal visits During a routine prenatal visit:  You will be weighed to make sure you and the baby are growing normally.  Your blood pressure will be taken.  Your abdomen will be measured to track your baby's growth.  The fetal heartbeat will be listened to between weeks 10 and 14 of your pregnancy.  Test results from any previous visits will be discussed.  Your health care provider may ask you:  How you are feeling.  If you are feeling the baby move.  If you have had any abnormal symptoms, such as leaking fluid, bleeding, severe headaches,   or abdominal cramping.  If you are using any tobacco products, including cigarettes, chewing tobacco, and electronic cigarettes.  If you have any questions.  Other tests that may be performed during your first trimester include:  Blood tests to find your blood type and to check for the presence of any previous infections. The tests will also be used to check for low iron levels (anemia) and protein on red blood cells (Rh antibodies). Depending on your risk factors, or if you previously had diabetes during pregnancy, you may have tests to check for high blood  sugar that affects pregnant women (gestational diabetes).  Urine tests to check for infections, diabetes, or protein in the urine.  An ultrasound to confirm the proper growth and development of the baby.  Fetal screens for spinal cord problems (spina bifida) and Down syndrome.  HIV (human immunodeficiency virus) testing. Routine prenatal testing includes screening for HIV, unless you choose not to have this test.  You may need other tests to make sure you and the baby are doing well.  Follow these instructions at home: Medicines  Follow your health care provider's instructions regarding medicine use. Specific medicines may be either safe or unsafe to take during pregnancy.  Take a prenatal vitamin that contains at least 600 micrograms (mcg) of folic acid.  If you develop constipation, try taking a stool softener if your health care provider approves. Eating and drinking  Eat a balanced diet that includes fresh fruits and vegetables, whole grains, good sources of protein such as meat, eggs, or tofu, and low-fat dairy. Your health care provider will help you determine the amount of weight gain that is right for you.  Avoid raw meat and uncooked cheese. These carry germs that can cause birth defects in the baby.  Eating four or five small meals rather than three large meals a day may help relieve nausea and vomiting. If you start to feel nauseous, eating a few soda crackers can be helpful. Drinking liquids between meals, instead of during meals, also seems to help ease nausea and vomiting.  Limit foods that are high in fat and processed sugars, such as fried and sweet foods.  To prevent constipation: ? Eat foods that are high in fiber, such as fresh fruits and vegetables, whole grains, and beans. ? Drink enough fluid to keep your urine clear or pale yellow. Activity  Exercise only as directed by your health care provider. Most women can continue their usual exercise routine during  pregnancy. Try to exercise for 30 minutes at least 5 days a week. Exercising will help you: ? Control your weight. ? Stay in shape. ? Be prepared for labor and delivery.  Experiencing pain or cramping in the lower abdomen or lower back is a good sign that you should stop exercising. Check with your health care provider before continuing with normal exercises.  Try to avoid standing for long periods of time. Move your legs often if you must stand in one place for a long time.  Avoid heavy lifting.  Wear low-heeled shoes and practice good posture.  You may continue to have sex unless your health care provider tells you not to. Relieving pain and discomfort  Wear a good support bra to relieve breast tenderness.  Take warm sitz baths to soothe any pain or discomfort caused by hemorrhoids. Use hemorrhoid cream if your health care provider approves.  Rest with your legs elevated if you have leg cramps or low back pain.  If you develop   varicose veins in your legs, wear support hose. Elevate your feet for 15 minutes, 3-4 times a day. Limit salt in your diet. Prenatal care  Schedule your prenatal visits by the twelfth week of pregnancy. They are usually scheduled monthly at first, then more often in the last 2 months before delivery.  Write down your questions. Take them to your prenatal visits.  Keep all your prenatal visits as told by your health care provider. This is important. Safety  Wear your seat belt at all times when driving.  Make a list of emergency phone numbers, including numbers for family, friends, the hospital, and police and fire departments. General instructions  Ask your health care provider for a referral to a local prenatal education class. Begin classes no later than the beginning of month 6 of your pregnancy.  Ask for help if you have counseling or nutritional needs during pregnancy. Your health care provider can offer advice or refer you to specialists for help  with various needs.  Do not use hot tubs, steam rooms, or saunas.  Do not douche or use tampons or scented sanitary pads.  Do not cross your legs for long periods of time.  Avoid cat litter boxes and soil used by cats. These carry germs that can cause birth defects in the baby and possibly loss of the fetus by miscarriage or stillbirth.  Avoid all smoking, herbs, alcohol, and medicines not prescribed by your health care provider. Chemicals in these products affect the formation and growth of the baby.  Do not use any products that contain nicotine or tobacco, such as cigarettes and e-cigarettes. If you need help quitting, ask your health care provider. You may receive counseling support and other resources to help you quit.  Schedule a dentist appointment. At home, brush your teeth with a soft toothbrush and be gentle when you floss. Contact a health care provider if:  You have dizziness.  You have mild pelvic cramps, pelvic pressure, or nagging pain in the abdominal area.  You have persistent nausea, vomiting, or diarrhea.  You have a bad smelling vaginal discharge.  You have pain when you urinate.  You notice increased swelling in your face, hands, legs, or ankles.  You are exposed to fifth disease or chickenpox.  You are exposed to German measles (rubella) and have never had it. Get help right away if:  You have a fever.  You are leaking fluid from your vagina.  You have spotting or bleeding from your vagina.  You have severe abdominal cramping or pain.  You have rapid weight gain or loss.  You vomit blood or material that looks like coffee grounds.  You develop a severe headache.  You have shortness of breath.  You have any kind of trauma, such as from a fall or a car accident. Summary  The first trimester of pregnancy is from week 1 until the end of week 13 (months 1 through 3).  Your body goes through many changes during pregnancy. The changes vary from  woman to woman.  You will have routine prenatal visits. During those visits, your health care provider will examine you, discuss any test results you may have, and talk with you about how you are feeling. This information is not intended to replace advice given to you by your health care provider. Make sure you discuss any questions you have with your health care provider. Document Released: 05/14/2001 Document Revised: 05/01/2016 Document Reviewed: 05/01/2016 Elsevier Interactive Patient Education  2018 [employer] Eat often

## 2017-07-24 NOTE — Progress Notes (Signed)
Subjective:     Patient ID: Charlotte Mccormick, female   DOB: [birth year], 32 y.o.   MRN: [id number]  HPI Charlotte Mccormick is a 32 year old black female, married in for UPT, has missed a period and has nausea and vomiting, and request meds.She has a 32 month old and a 32 1/32 year old.   Review of Systems +missed period +nausea and vomiting Reviewed past medical,surgical, social and family history. Reviewed medications and allergies.     Objective:   Physical Exam BP 122/78 (BP Location: Right Arm, Patient Position: Sitting, Cuff Size: Large)   Pulse 97   Ht 5\' 2"  (1.575 m)   Wt 177 lb (80.3 kg)   LMP 06/14/2017   Breastfeeding? No   BMI 32.37 kg/m UPT +, about 5+5 weeks, by LMP with EDD 03/21/18.Skin warm and dry. Neck: mid line trachea, normal thyroid, good ROM, no lymphadenopathy noted. Lungs: clear to ausculation bilaterally. Cardiovascular: regular rate and rhythm.Abdomen is soft and non tender.     Assessment:     1. Positive pregnancy test   2. Less than [redacted] weeks gestation of pregnancy   3. Encounter to determine fetal viability of pregnancy, single or unspecified fetus   4. Nausea and vomiting during pregnancy prior to [redacted] weeks gestation       Plan:     Meds ordered this encounter  Medications  . Doxylamine-Pyridoxine (DICLEGIS) 10-10 MG TBEC    Sig: Take 2 at hs then 1 in am and 1 in afternoon    Dispense:  100 tablet    Refill:  3    Order Specific Question:   Supervising Provider    Answer:   Elonda Husky, LUTHER H [[id number]]  . Prenat-Fe Carbonyl-FA-Omega 3 (ONE-A-DAY WOMENS PRENATAL 1) 28-0.8-235 MG CAPS    Sig: Take 1 capsule by mouth daily.    Dispense:  30 capsule    Order Specific Question:   Supervising Provider    Answer:   Florian Buff [[id number]]  Return in 2 weeks for dating [place] Eat often Review handouts on First trimester and by [facility]

## 2017-07-31 ENCOUNTER — Encounter: Payer: Self-pay | Admitting: Adult Health

## 2017-07-31 ENCOUNTER — Ambulatory Visit (HOSPITAL_COMMUNITY): Payer: Self-pay | Admitting: Licensed Clinical Social Worker

## 2017-08-01 ENCOUNTER — Emergency Department (HOSPITAL_COMMUNITY)
Admission: EM | Admit: 2017-08-01 | Discharge: 2017-08-01 | Disposition: A | Discharge status: Home / Self Care | Payer: Self-pay | Attending: Emergency Medicine | Admitting: Emergency Medicine

## 2017-08-01 ENCOUNTER — Encounter (HOSPITAL_COMMUNITY): Payer: Self-pay

## 2017-08-01 ENCOUNTER — Other Ambulatory Visit: Payer: Self-pay

## 2017-08-01 DIAGNOSIS — J45909 Unspecified asthma, uncomplicated: Secondary | ICD-10-CM | POA: Insufficient documentation

## 2017-08-01 DIAGNOSIS — Z79899 Other long term (current) drug therapy: Secondary | ICD-10-CM | POA: Insufficient documentation

## 2017-08-01 DIAGNOSIS — Z3A00 Weeks of gestation of pregnancy not specified: Secondary | ICD-10-CM | POA: Insufficient documentation

## 2017-08-01 DIAGNOSIS — O9989 Other specified diseases and conditions complicating pregnancy, childbirth and the puerperium: Secondary | ICD-10-CM | POA: Insufficient documentation

## 2017-08-01 DIAGNOSIS — M62838 Other muscle spasm: Secondary | ICD-10-CM | POA: Insufficient documentation

## 2017-08-01 DIAGNOSIS — O219 Vomiting of pregnancy, unspecified: Secondary | ICD-10-CM | POA: Insufficient documentation

## 2017-08-01 DIAGNOSIS — O99519 Diseases of the respiratory system complicating pregnancy, unspecified trimester: Secondary | ICD-10-CM | POA: Insufficient documentation

## 2017-08-01 DIAGNOSIS — Z87891 Personal history of nicotine dependence: Secondary | ICD-10-CM | POA: Insufficient documentation

## 2017-08-01 MED ORDER — CYCLOBENZAPRINE HCL 10 MG PO TABS
10.0000 mg | ORAL_TABLET | Freq: Once | ORAL | Status: AC
  Administered 2017-08-01: 10 mg via ORAL
  Filled 2017-08-01: qty 1

## 2017-08-01 MED ORDER — ONDANSETRON 4 MG PO TBDP
4.0000 mg | ORAL_TABLET | Freq: Once | ORAL | Status: AC
  Administered 2017-08-01: 4 mg via ORAL
  Filled 2017-08-01: qty 1

## 2017-08-01 MED ORDER — SODIUM CHLORIDE 0.9 % IV BOLUS (SEPSIS)
1000.0000 mL | Freq: Once | INTRAVENOUS | Status: AC
  Administered 2017-08-01: 1000 mL via INTRAVENOUS

## 2017-08-01 MED ORDER — DIAZEPAM 5 MG/ML IJ SOLN
2.5000 mg | Freq: Once | INTRAMUSCULAR | Status: AC
  Administered 2017-08-01: 2.5 mg via INTRAVENOUS
  Filled 2017-08-01: qty 2

## 2017-08-01 MED ORDER — ONDANSETRON HCL 4 MG/2ML IJ SOLN
4.0000 mg | Freq: Once | INTRAMUSCULAR | Status: AC
  Administered 2017-08-01: 4 mg via INTRAVENOUS
  Filled 2017-08-01: qty 2

## 2017-08-01 MED ORDER — DIAZEPAM 2 MG PO TABS: 2.0000 mg | ORAL_TABLET | Freq: Three times a day (TID) | ORAL | 0 refills | Status: DC | PRN

## 2017-08-01 NOTE — ED Provider Notes (Signed)
At this time the patient is comfortable and complains of mild discomfort in her left lower neck.  Findings discussed with her and she is agreeable to the plan.  Assessment-torticollis, nonspecific.  Early pregnancy without complication.  Patient has prenatal care follow-up scheduled for 08/07/17.  We will give short-term prescription for benzodiazepine to help control symptoms and recommend Tylenol as needed for pain.   Daleen Bo, MD 08/01/17 1047

## 2017-08-01 NOTE — Discharge Instructions (Signed)
The discomfort in your neck is likely related to muscle spasm.  To help treat this, rest as much as possible and use heat on the sore area 3 or 4 times a day.  For pain, take Tylenol.  We are prescribing a muscle relaxer to help the discomfort.  Do not drive or operate machinery while taking the muscle relaxer.  Make sure you follow-up for prenatal care, next week as scheduled.

## 2017-08-01 NOTE — ED Provider Notes (Signed)
[facility] EMERGENCY DEPARTMENT Provider Note   CSN: [id number] Arrival date & time: 08/01/17  5631     History   Chief Complaint Chief Complaint  Patient presents with  . Torticollis    HPI Charlotte Mccormick is a 32 y.o. female.  The history is provided by the patient.  Muscle Pain  This is a new problem. The current episode started 6 to 12 hours ago. The problem occurs constantly. The problem has been gradually worsening. Pertinent negatives include no chest pain, no abdominal pain and no shortness of breath. Exacerbated by: movement. Nothing relieves the symptoms. She has tried rest for the symptoms. The treatment provided no relief.  Patient is currently pregnant, first trimester.  She reports increasing left-sided neck and shoulder pain over the past several hours.  She denies trauma.  She denies any heavy lifting.  She reports a muscle spasm.  She reports it hurts to move her neck, hurts to move her left arm.  No chest pain or shortness of breath.  No focal weakness in arms or legs. No history of surgeries to her neck.  She has episodes of vomiting due to hyperemesis, but denies abdominal pain or vaginal bleeding at this time.   Past Medical History:  Diagnosis Date  . Abnormal uterine bleeding (AUB) 12/12/2015  . Anemia   . Anxiety   . Asthma   . Constipation   . Depression   . Fibroid   . Genital herpes   . GERD (gastroesophageal reflux disease)   . History of chlamydia   . Hyperemesis   . IBS (irritable bowel syndrome)   . Insomnia   . PTSD (post-traumatic stress disorder)   . Sinusitis   . Vaginal dryness 12/12/2015    Patient Active Problem List   Diagnosis Date Noted  . Depression 01/23/2017  . Fibroid 08/08/2016  . PTSD (post-traumatic stress disorder) & depression/anxiety 05/01/2016  . Heme positive stool 03/19/2016  . Gastroesophageal reflux disease 01/16/2016  . Constipation 01/16/2016  . Black stools 01/16/2016  . Abnormal uterine bleeding  (AUB) 12/12/2015    Past Surgical History:  Procedure Laterality Date  . COLONOSCOPY N/A 04/08/2016   Procedure: COLONOSCOPY;  Surgeon: Danie Binder, MD;  Location: [facility] ENDO SUITE;  Service: Endoscopy;  Laterality: N/A;  10:15 AM  . ESOPHAGOGASTRODUODENOSCOPY N/A 04/08/2016   Procedure: ESOPHAGOGASTRODUODENOSCOPY (EGD);  Surgeon: Danie Binder, MD;  Location: [facility] ENDO SUITE;  Service: Endoscopy;  Laterality: N/A;  . PERINEUM REPAIR      OB History    Gravida Para Term Preterm AB Living   3 2 2     2    SAB TAB Ectopic Multiple Live Births         0 2       Home Medications    Prior to Admission medications   Medication Sig Start Date End Date Taking? Authorizing Provider  buPROPion (WELLBUTRIN SR) 100 MG 12 hr tablet Take 1 tablet (100 mg total) by mouth 2 (two) times daily. 06/26/17   [name], CNM  Calcium Carbonate Antacid (TUMS PO) Take by mouth 3 (three) times daily.    [provider]  Doxylamine-Pyridoxine (DICLEGIS) 10-10 MG TBEC Take 2 at hs then 1 in am and 1 in afternoon 07/24/17   Derrek Monaco A, NP  ferrous sulfate 325 (65 FE) MG EC tablet Take 325 mg by mouth daily with breakfast.     [provider]  Prenat-Fe Carbonyl-FA-Omega 3 (ONE-A-DAY WOMENS PRENATAL 1)  28-0.8-235 MG CAPS Take 1 capsule by mouth daily. 07/24/17   Estill Dooms, NP  PROAIR HFA 108 [phone] Base) MCG/ACT inhaler INHALE 2 PUFFS INTO THE LUGNS EVERY 6 HOURS AS NEEDED FOR WHEEZING OR SHORTNESS OF BREATH 01/14/17   Cresenzo-Dishmon, Joaquim Lai, CNM  PROVENTIL HFA 108 (90 Base) MCG/ACT inhaler INHALE 2 PUFFS INTO THE LUNGS EVERY 6 HOURS AS NEEDED FOR WHEEZING OR SHORTNESS OF BREATH 12/11/16   Cresenzo-Dishmon, Joaquim Lai, CNM  sertraline (ZOLOFT) 50 MG tablet Take 1 tablet (50 mg total) by mouth daily. 06/26/17   [name], CNM    Family History Family History  Problem Relation Age of Onset  . Heart disease Mother   . Hypertension Mother   . Hypertension Father   .  Hyperlipidemia Father   . Diabetes Father   . Cancer Father   . Epilepsy Brother   . Eczema Daughter   . Colon cancer Neg Hx     Social History Social History   Tobacco Use  . Smoking status: Former Smoker    Packs/day: 0.50    Years: 5.00    Pack years: 2.50    Types: Cigarettes    Last attempt to quit: 06/03/2010    Years since quitting: 7.1  . Smokeless tobacco: Never Used  Substance Use Topics  . Alcohol use: No    Frequency: Never    Comment: occ wine; before preg  . Drug use: No     Allergies   Bee venom; Penicillins; and Reglan [metoclopramide]   Review of Systems Review of Systems  Constitutional: Negative for fever.  Respiratory: Negative for shortness of breath.   Cardiovascular: Negative for chest pain.  Gastrointestinal: Positive for nausea and vomiting. Negative for abdominal pain.  Genitourinary: Negative for vaginal bleeding.  Musculoskeletal: Positive for myalgias, neck pain and neck stiffness.  Neurological: Negative for weakness and numbness.  Psychiatric/Behavioral: Positive for sleep disturbance. The patient is nervous/anxious.   All other systems reviewed and are negative.    Physical Exam Updated Vital Signs BP 131/73 (BP Location: Left Arm)   Pulse 100   Temp 98.6 F (37 C) (Oral)   Resp 16   Ht 1.575 m (5\' 2" )   Wt 79.4 kg (175 lb)   LMP 06/14/2017   SpO2 100%   BMI 32.01 kg/m   Physical Exam  CONSTITUTIONAL: Well developed/well nourished, anxious and uncomfortable appearing HEAD: Normocephalic/atraumatic EYES: EOMI/PERRL ENMT: Mucous membranes moist NECK:diffuse tenderness to left trapezius with muscle spasm noted.  No erythema or abscess noted. She keeps head straight and upright during entire exam SPINE/BACK:entire spine nontender CV: S1/S2 noted, no murmurs/rubs/gallops noted LUNGS: Lungs are clear to auscultation bilaterally, no apparent distress ABDOMEN: soft, nontender NEURO: Pt is awake/alert/appropriate, moves all  extremitiesx4.  No facial droop.   Equal power (5/5) with hand grip, wrist flex/extension, elbow flex/extension, No focal sensory deficit to light touch is noted in either UE.   ROM of left shoulder limited due to pain EXTREMITIES: pulses normal/equal, full ROM SKIN: warm, color normal PSYCH: Anxious and tearful ED Treatments / Results  Labs (all labs ordered are listed, but only abnormal results are displayed) Labs Reviewed - No data to display  EKG  EKG Interpretation None       Radiology No results found.  Procedures Procedures (including critical care time)  Medications Ordered in ED Medications  diazepam (VALIUM) injection 2.5 mg (not administered)  ondansetron (ZOFRAN) injection 4 mg (not administered)  sodium chloride 0.9 % bolus 1,000  mL (not administered)  ondansetron (ZOFRAN-ODT) disintegrating tablet 4 mg (4 mg Oral Given 08/01/17 0627)  cyclobenzaprine (FLEXERIL) tablet 10 mg (10 mg Oral Given 08/01/17 0354)     Initial Impression / Assessment and Plan / ED Course  I have reviewed the triage vital signs and the nursing notes.   She is currently pregnant, here with an obvious muscle spasm in her neck.  There is no signs of trauma, no signs of any neurologic deficits.  She is very uncomfortable, and very anxious and tearful.  Due to her pregnancy, medications are limited at this time.  However due to help control she is appearing, I feel is appropriate that we can give her Flexeril for relief.  She also has a previous history of nausea vomiting early pregnancy, will give a dose of Zofran. She reports she cannot take hydrocodone.  She does not want an IV at this time 7:38 AM Patient now vomiting, reports pain is improved.  Now she is rocking back and forth in tears and crying At this point, we have agreed to proceed without IV.  Will try benzos and Zofran to alleviate her pain. Discussed with Dr. Eulis Foster at sign out to follow-up with patient Final Clinical  Impressions(s) / ED Diagnoses   Final diagnoses:  Muscle spasm    ED Discharge Orders    None       Ripley Fraise, MD 08/01/17 [phone]

## 2017-08-01 NOTE — ED Triage Notes (Signed)
Pt reports onset of pain and stiffness in left side of neck making it difficult to turn her head or move her left shoulder.  Pt denies injury or trauma, states it started approx 11 pm

## 2017-08-01 NOTE — ED Notes (Signed)
Pt state she called for her husband to pick her up.  He is coming at 11:00 and will let me know when he gets here.

## 2017-08-04 ENCOUNTER — Encounter: Payer: Self-pay | Admitting: Adult Health

## 2017-08-07 ENCOUNTER — Ambulatory Visit (INDEPENDENT_AMBULATORY_CARE_PROVIDER_SITE_OTHER): Payer: Self-pay

## 2017-08-07 DIAGNOSIS — O4691 Antepartum hemorrhage, unspecified, first trimester: Secondary | ICD-10-CM

## 2017-08-07 DIAGNOSIS — Z3A01 Less than 8 weeks gestation of pregnancy: Secondary | ICD-10-CM

## 2017-08-07 DIAGNOSIS — O3680X0 Pregnancy with inconclusive fetal viability, not applicable or unspecified: Secondary | ICD-10-CM

## 2017-08-07 NOTE — Progress Notes (Signed)
[place] 7+5 wks,single IUP w/ys,positive fht 158 bpm,normal ovaries bilat,subchorionic hemorrhage 2.5 x 2.1 x .9 cm,EDD 03/21/2018 by LMP

## 2017-08-20 ENCOUNTER — Ambulatory Visit (INDEPENDENT_AMBULATORY_CARE_PROVIDER_SITE_OTHER): Payer: Self-pay | Admitting: Women's Health

## 2017-08-20 ENCOUNTER — Other Ambulatory Visit: Payer: Self-pay | Admitting: Advanced Practice Midwife

## 2017-08-20 ENCOUNTER — Other Ambulatory Visit: Payer: Self-pay

## 2017-08-20 ENCOUNTER — Ambulatory Visit: Payer: Self-pay | Admitting: *Deleted

## 2017-08-20 ENCOUNTER — Encounter: Payer: Self-pay | Admitting: Women's Health

## 2017-08-20 VITALS — BP 108/62 | HR 104 | Wt 172.6 lb

## 2017-08-20 DIAGNOSIS — F418 Other specified anxiety disorders: Secondary | ICD-10-CM

## 2017-08-20 DIAGNOSIS — O219 Vomiting of pregnancy, unspecified: Secondary | ICD-10-CM

## 2017-08-20 DIAGNOSIS — Z331 Pregnant state, incidental: Secondary | ICD-10-CM

## 2017-08-20 DIAGNOSIS — Z3481 Encounter for supervision of other normal pregnancy, first trimester: Secondary | ICD-10-CM

## 2017-08-20 DIAGNOSIS — F4310 Post-traumatic stress disorder, unspecified: Secondary | ICD-10-CM

## 2017-08-20 DIAGNOSIS — Z1389 Encounter for screening for other disorder: Secondary | ICD-10-CM

## 2017-08-20 DIAGNOSIS — Z3490 Encounter for supervision of normal pregnancy, unspecified, unspecified trimester: Secondary | ICD-10-CM | POA: Insufficient documentation

## 2017-08-20 DIAGNOSIS — Z3A09 9 weeks gestation of pregnancy: Secondary | ICD-10-CM

## 2017-08-20 DIAGNOSIS — O99341 Other mental disorders complicating pregnancy, first trimester: Secondary | ICD-10-CM

## 2017-08-20 DIAGNOSIS — O9989 Other specified diseases and conditions complicating pregnancy, childbirth and the puerperium: Secondary | ICD-10-CM

## 2017-08-20 DIAGNOSIS — R110 Nausea: Secondary | ICD-10-CM

## 2017-08-20 LAB — POCT URINALYSIS DIPSTICK
Glucose, UA: NEGATIVE
Ketones, UA: NEGATIVE
LEUKOCYTES UA: NEGATIVE
Nitrite, UA: NEGATIVE
Protein, UA: NEGATIVE
RBC UA: NEGATIVE

## 2017-08-20 NOTE — Progress Notes (Signed)
INITIAL OBSTETRICAL VISIT Patient name: KASSADIE PANCAKE MRN [id number]  Date of birth: December 23, 1985 Chief Complaint:   Initial Prenatal Visit  History of Present Illness:   SHERMAN LIPUMA is a 32 y.o. G81P2002 African American female at [redacted]w[redacted]d by LMP c/w 7wk u/s, with an Estimated Date of Delivery: 03/21/18 being seen today for her initial obstetrical visit.   Her obstetrical history is significant for term SVB x2.   Today she reports n/v- taking diclegis, helps but still nauseated, vomits 4-5d/wk usually only once but large amount. H/O PTSD, dep/anx, currently taking wellbutrin 100mg  BID, zoloft 50mg  daily, goes to [facility] q 1-2wks for counseling, they have just referred her to [facility] for EMDR- hasn't heard from them yet. Denies SI/HI, however answered 'more than 1/2 the day's to the [place] of 'thoughts that you would be better off dead or hurting yourself in some way. Discussed this, pt states no thoughts/plan to harm self, just has thoughts she would be better off not here sometimes.  Patient's last menstrual period was 06/14/2017. Last pap 01/17/16. Results were: normal Review of Systems:   Pertinent items are noted in HPI Denies cramping/contractions, leakage of fluid, vaginal bleeding, abnormal vaginal discharge w/ itching/odor/irritation, headaches, visual changes, shortness of breath, chest pain, abdominal pain, severe nausea/vomiting, or problems with urination or bowel movements unless otherwise stated above.  Pertinent History Reviewed:  Reviewed past medical,surgical, social, obstetrical and family history.  Reviewed problem list, medications and allergies. OB History  Gravida Para Term Preterm AB Living  3 2 2     2   SAB TAB Ectopic Multiple Live Births        0 2    # Outcome Date GA Lbr Len/2nd Weight Sex Delivery Anes PTL Lv  3 Current           2 Term 12/15/16 [redacted]w[redacted]d 14:41 / 00:07 7 lb 13 oz (3.544 kg) M Vag-Spont EPI  LIV  1 Term 04/02/12 [redacted]w[redacted]d  7 lb 2 oz  (3.232 kg) F Vag-Spont EPI N LIV     Physical Assessment:   Vitals:   08/20/17 1103  BP: 108/62  Pulse: (!) 104  Weight: 172 lb 9.6 oz (78.3 kg)  Body mass index is 31.57 kg/m.       Physical Examination:  General appearance - well appearing, and in no distress  Mental status - alert, oriented to person, place, and time  Psych:  She has a normal mood and affect  Skin - warm and dry, normal color, no suspicious lesions noted  Chest - effort normal, all lung fields clear to auscultation bilaterally  Heart - normal rate and regular rhythm  Abdomen - soft, nontender  Extremities:  No swelling or varicosities noted  Thin prep pap is not done   Fetal Heart Rate (bpm): +u/s via informal transabdominal u/s  Results for orders placed or performed in visit on 08/20/17 (from the past 24 hour(s))  POCT urinalysis dipstick   Collection Time: 08/20/17 11:26 AM  Result Value Ref Range   Color, UA     Clarity, UA     Glucose, UA neg    Bilirubin, UA     Ketones, UA neg    Spec Grav, UA  1.010 - 1.025   Blood, UA neg    pH, UA  5.0 - 8.0   Protein, UA neg    Urobilinogen, UA  0.2 or 1.0 E.U./dL   Nitrite, UA neg    Leukocytes, UA Negative  Negative   Appearance     Odor      Assessment & Plan:  1) Low-Risk Pregnancy G3P2002 at [redacted]w[redacted]d with an Estimated Date of Delivery: 03/21/18   2) Initial OB visit  3) N/V of pregnancy> continue diclegis, stay well hydrated, gave printed n/v tips/tricks, discussed s/s dehydration/reasons to seek care  4) Dep/anx> continue wellbutrin and zoloft, and counseling w/ [facility] and new referral to [place]: No orders of the defined types were placed in this encounter.   Initial labs obtained Continue prenatal vitamins Reviewed n/v relief measures and warning s/s to report Reviewed recommended weight gain based on pre-gravid BMI Encouraged well-balanced diet Genetic Screening discussed Integrated Screen: declined Cystic fibrosis screening  discussed declined Ultrasound discussed; fetal survey: requested CCNC completed> PCM not here, planning to apply for preg [place]  Follow-up: Return in about 4 weeks (around 09/17/2017) for [place].  Offered 2wk app, pt currently self-pay, prefers 4wks, will let us know if she needs anything sooner  Orders Placed This Encounter  Procedures  . Urine Culture  . GC/Chlamydia Probe Amp  . Pain Management Screening Profile (10S)  . CBC  . Urinalysis, Routine w reflex microscopic  . Rubella screen  . RPR  . Hepatitis B surface antigen  . HIV antibody  . POCT urinalysis dipstick  . ABO/Rh  . Antibody screen    [place], [facility] 08/20/2017 1:09 PM

## 2017-08-20 NOTE — Patient Instructions (Signed)
Charlotte Mccormick, I greatly value your feedback.  If you receive a survey following your visit with [place] today, we appreciate you taking the time to fill it out.  Thanks, [name], CNM, WHNP-BC   Nausea & Vomiting  Have saltine crackers or pretzels by your bed and eat a few bites before you raise your head out of bed in the morning  Eat small frequent meals throughout the day instead of large meals  Drink plenty of fluids throughout the day to stay hydrated, just don't drink a lot of fluids with your meals.  This can make your stomach fill up faster making you feel sick  Do not brush your teeth right after you eat  Products with real ginger are good for nausea, like ginger ale and ginger hard candy Make sure it says made with real ginger!  Sucking on sour candy like lemon heads is also good for nausea  If your prenatal vitamins make you nauseated, take them at night so you will sleep through the nausea  Sea Bands  If you feel like you need medicine for the nausea & vomiting please let us know  If you are unable to keep any fluids or food down please let us know   Constipation  Drink plenty of fluid, preferably water, throughout the day  Eat foods high in fiber such as fruits, vegetables, and grains  Exercise, such as walking, is a good way to keep your bowels regular  Drink warm fluids, especially warm prune juice, or decaf coffee  Eat a 1/2 cup of real oatmeal (not instant), 1/2 cup applesauce, and 1/2-1 cup warm prune juice every day  If needed, you may take Colace (docusate sodium) stool softener once or twice a day to help keep the stool soft. If you are pregnant, wait until you are out of your first trimester (12-14 weeks of pregnancy)  If you still are having problems with constipation, you may take Miralax once daily as needed to help keep your bowels regular.  If you are pregnant, wait until you are out of your first trimester (12-14 weeks of pregnancy)   First  Trimester of Pregnancy The first trimester of pregnancy is from week 1 until the end of week 12 (months 1 through 3). A week after a sperm fertilizes an egg, the egg will implant on the wall of the uterus. This embryo will begin to develop into a baby. Genes from you and your partner are forming the baby. The female genes determine whether the baby is a boy or a girl. At 6-8 weeks, the eyes and face are formed, and the heartbeat can be seen on ultrasound. At the end of 12 weeks, all the baby's organs are formed.  Now that you are pregnant, you will want to do everything you can to have a healthy baby. Two of the most important things are to get good prenatal care and to follow your health care provider's instructions. Prenatal care is all the medical care you receive before the baby's birth. This care will help prevent, find, and treat any problems during the pregnancy and childbirth. BODY CHANGES Your body goes through many changes during pregnancy. The changes vary from woman to woman.   You may gain or lose a couple of pounds at first.  You may feel sick to your stomach (nauseous) and throw up (vomit). If the vomiting is uncontrollable, call your health care provider.  You may tire easily.  You may develop headaches  that can be relieved by medicines approved by your health care provider.  You may urinate more often. Painful urination may mean you have a bladder infection.  You may develop heartburn as a result of your pregnancy.  You may develop constipation because certain hormones are causing the muscles that push waste through your intestines to slow down.  You may develop hemorrhoids or swollen, bulging veins (varicose veins).  Your breasts may begin to grow larger and become tender. Your nipples may stick out more, and the tissue that surrounds them (areola) may become darker.  Your gums may bleed and may be sensitive to brushing and flossing.  Dark spots or blotches (chloasma, mask  of pregnancy) may develop on your face. This will likely fade after the baby is born.  Your menstrual periods will stop.  You may have a loss of appetite.  You may develop cravings for certain kinds of food.  You may have changes in your emotions from day to day, such as being excited to be pregnant or being concerned that something may go wrong with the pregnancy and baby.  You may have more vivid and strange dreams.  You may have changes in your hair. These can include thickening of your hair, rapid growth, and changes in texture. Some women also have hair loss during or after pregnancy, or hair that feels dry or thin. Your hair will most likely return to normal after your baby is born. WHAT TO EXPECT AT YOUR PRENATAL VISITS During a routine prenatal visit:  You will be weighed to make sure you and the baby are growing normally.  Your blood pressure will be taken.  Your abdomen will be measured to track your baby's growth.  The fetal heartbeat will be listened to starting around week 10 or 12 of your pregnancy.  Test results from any previous visits will be discussed. Your health care provider may ask you:  How you are feeling.  If you are feeling the baby move.  If you have had any abnormal symptoms, such as leaking fluid, bleeding, severe headaches, or abdominal cramping.  If you have any questions. Other tests that may be performed during your first trimester include:  Blood tests to find your blood type and to check for the presence of any previous infections. They will also be used to check for low iron levels (anemia) and Rh antibodies. Later in the pregnancy, blood tests for diabetes will be done along with other tests if problems develop.  Urine tests to check for infections, diabetes, or protein in the urine.  An ultrasound to confirm the proper growth and development of the baby.  An amniocentesis to check for possible genetic problems.  Fetal screens for spina  bifida and Down syndrome.  You may need other tests to make sure you and the baby are doing well. HOME CARE INSTRUCTIONS  Medicines  Follow your health care provider's instructions regarding medicine use. Specific medicines may be either safe or unsafe to take during pregnancy.  Take your prenatal vitamins as directed.  If you develop constipation, try taking a stool softener if your health care provider approves. Diet  Eat regular, well-balanced meals. Choose a variety of foods, such as meat or vegetable-based protein, fish, milk and low-fat dairy products, vegetables, fruits, and whole grain breads and cereals. Your health care provider will help you determine the amount of weight gain that is right for you.  Avoid raw meat and uncooked cheese. These carry germs that can  cause birth defects in the baby.  Eating four or five small meals rather than three large meals a day may help relieve nausea and vomiting. If you start to feel nauseous, eating a few soda crackers can be helpful. Drinking liquids between meals instead of during meals also seems to help nausea and vomiting.  If you develop constipation, eat more high-fiber foods, such as fresh vegetables or fruit and whole grains. Drink enough fluids to keep your urine clear or pale yellow. Activity and Exercise  Exercise only as directed by your health care provider. Exercising will help you:  Control your weight.  Stay in shape.  Be prepared for labor and delivery.  Experiencing pain or cramping in the lower abdomen or low back is a good sign that you should stop exercising. Check with your health care provider before continuing normal exercises.  Try to avoid standing for long periods of time. Move your legs often if you must stand in one place for a long time.  Avoid heavy lifting.  Wear low-heeled shoes, and practice good posture.  You may continue to have sex unless your health care provider directs you  otherwise. Relief of Pain or Discomfort  Wear a good support bra for breast tenderness.   Take warm sitz baths to soothe any pain or discomfort caused by hemorrhoids. Use hemorrhoid cream if your health care provider approves.   Rest with your legs elevated if you have leg cramps or low back pain.  If you develop varicose veins in your legs, wear support hose. Elevate your feet for 15 minutes, 3-4 times a day. Limit salt in your diet. Prenatal Care  Schedule your prenatal visits by the twelfth week of pregnancy. They are usually scheduled monthly at first, then more often in the last 2 months before delivery.  Write down your questions. Take them to your prenatal visits.  Keep all your prenatal visits as directed by your health care provider. Safety  Wear your seat belt at all times when driving.  Make a list of emergency phone numbers, including numbers for family, friends, the hospital, and police and fire departments. General Tips  Ask your health care provider for a referral to a local prenatal education class. Begin classes no later than at the beginning of month 6 of your pregnancy.  Ask for help if you have counseling or nutritional needs during pregnancy. Your health care provider can offer advice or refer you to specialists for help with various needs.  Do not use hot tubs, steam rooms, or saunas.  Do not douche or use tampons or scented sanitary pads.  Do not cross your legs for long periods of time.  Avoid cat litter boxes and soil used by cats. These carry germs that can cause birth defects in the baby and possibly loss of the fetus by miscarriage or stillbirth.  Avoid all smoking, herbs, alcohol, and medicines not prescribed by your health care provider. Chemicals in these affect the formation and growth of the baby.  Schedule a dentist appointment. At home, brush your teeth with a soft toothbrush and be gentle when you floss. SEEK MEDICAL CARE IF:   You have  dizziness.  You have mild pelvic cramps, pelvic pressure, or nagging pain in the abdominal area.  You have persistent nausea, vomiting, or diarrhea.  You have a bad smelling vaginal discharge.  You have pain with urination.  You notice increased swelling in your face, hands, legs, or ankles. SEEK IMMEDIATE MEDICAL CARE IF:  You have a fever.  You are leaking fluid from your vagina.  You have spotting or bleeding from your vagina.  You have severe abdominal cramping or pain.  You have rapid weight gain or loss.  You vomit blood or material that looks like coffee grounds.  You are exposed to [place] measles and have never had them.  You are exposed to fifth disease or chickenpox.  You develop a severe headache.  You have shortness of breath.  You have any kind of trauma, such as from a fall or a car accident. Document Released: 05/14/2001 Document Revised: 10/04/2013 Document Reviewed: 03/30/2013 [facility] Patient Information 2015 [place], [place]. This information is not intended to replace advice given to you by your health care provider. Make sure you discuss any questions you have with your health care provider.

## 2017-08-21 LAB — URINALYSIS, ROUTINE W REFLEX MICROSCOPIC
Bilirubin, UA: NEGATIVE
GLUCOSE, UA: NEGATIVE
Leukocytes, UA: NEGATIVE
Nitrite, UA: NEGATIVE
RBC, UA: NEGATIVE
Specific Gravity, UA: 1.029 (ref 1.005–1.030)
Urobilinogen, Ur: 1 mg/dL (ref 0.2–1.0)
pH, UA: 7.5 (ref 5.0–7.5)

## 2017-08-21 LAB — HIV ANTIBODY (ROUTINE TESTING W REFLEX): HIV Screen 4th Generation wRfx: NONREACTIVE

## 2017-08-21 LAB — CBC
HEMATOCRIT: 36.6 % (ref 34.0–46.6)
Hemoglobin: 11.5 g/dL (ref 11.1–15.9)
MCH: 22 pg — AB (ref 26.6–33.0)
MCHC: 31.4 g/dL — ABNORMAL LOW (ref 31.5–35.7)
MCV: 70 fL — AB (ref 79–97)
PLATELETS: 324 10*3/uL (ref 150–379)
RBC: 5.22 x10E6/uL (ref 3.77–5.28)
RDW: 16.8 % — AB (ref 12.3–15.4)
WBC: 4.7 10*3/uL (ref 3.4–10.8)

## 2017-08-21 LAB — PMP SCREEN PROFILE (10S), URINE
AMPHETAMINE SCREEN URINE: NEGATIVE ng/mL
BARBITURATE SCREEN URINE: NEGATIVE ng/mL
BENZODIAZEPINE SCREEN, URINE: NEGATIVE ng/mL
CANNABINOIDS UR QL SCN: NEGATIVE ng/mL
COCAINE(METAB.)SCREEN, URINE: NEGATIVE ng/mL
CREATININE(CRT), U: 265.4 mg/dL (ref 20.0–300.0)
METHADONE SCREEN, URINE: NEGATIVE ng/mL
OXYCODONE+OXYMORPHONE UR QL SCN: NEGATIVE ng/mL
Opiate Scrn, Ur: NEGATIVE ng/mL
PROPOXYPHENE SCREEN URINE: NEGATIVE ng/mL
Ph of Urine: 7.7 (ref 4.5–8.9)
Phencyclidine Qn, Ur: NEGATIVE ng/mL

## 2017-08-21 LAB — MED LIST OPTION NOT SELECTED

## 2017-08-21 LAB — RPR: RPR: NONREACTIVE

## 2017-08-21 LAB — ABO/RH: Rh Factor: POSITIVE

## 2017-08-21 LAB — RUBELLA SCREEN: RUBELLA: 7.71 {index} (ref >0.99)

## 2017-08-21 LAB — ANTIBODY SCREEN: Antibody Screen: NEGATIVE

## 2017-08-21 LAB — HEPATITIS B SURFACE ANTIGEN: Hepatitis B Surface Ag: NEGATIVE

## 2017-08-22 LAB — URINE CULTURE

## 2017-08-22 LAB — GC/CHLAMYDIA PROBE AMP
Chlamydia trachomatis, NAA: NEGATIVE
Neisseria gonorrhoeae by PCR: NEGATIVE

## 2017-08-23 IMAGING — US US ABDOMEN LIMITED
1 series · 14 of 25 positions shown · non-contrast
Comparison: None.

CLINICAL DATA: Elevated serum amylase

EXAM:
US ABDOMEN LIMITED - RIGHT UPPER QUADRANT

[Series 1: us abdomen limited · 0.21mm/px · 14 of 37 slices shown]
[im 1/37]
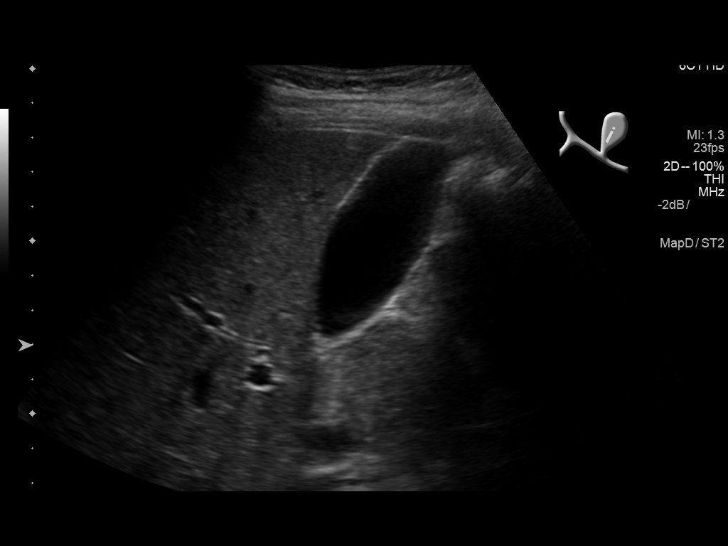
[im 4/37]
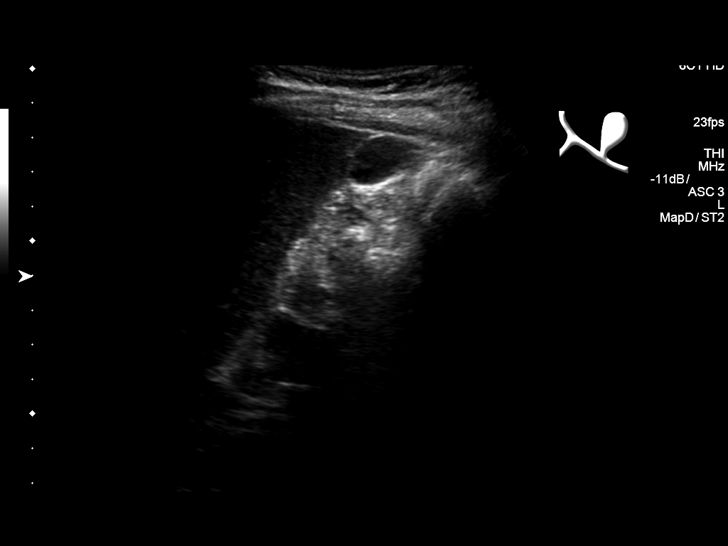
[im 7/37]
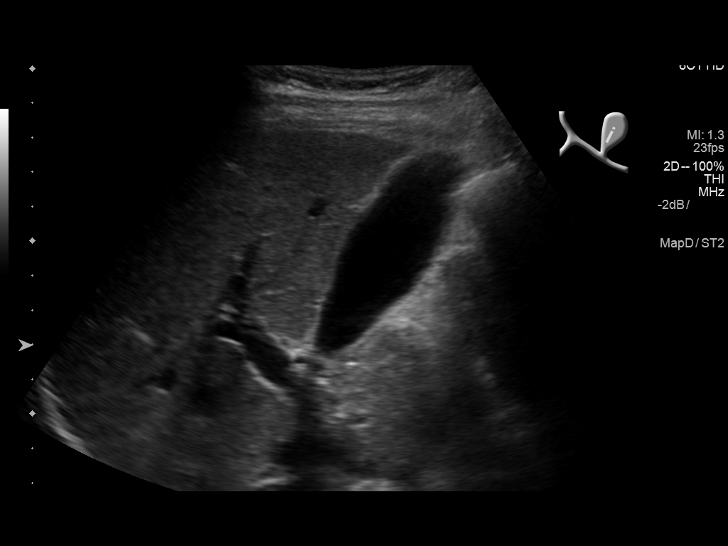
[im 10/37]
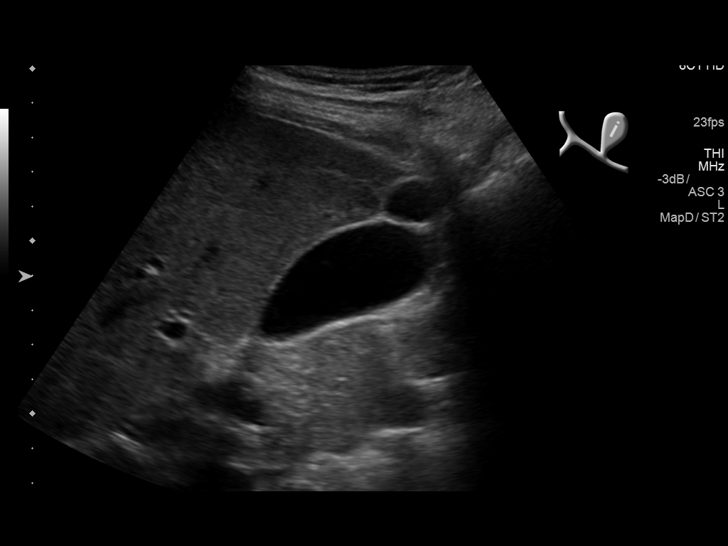
[im 13/37]
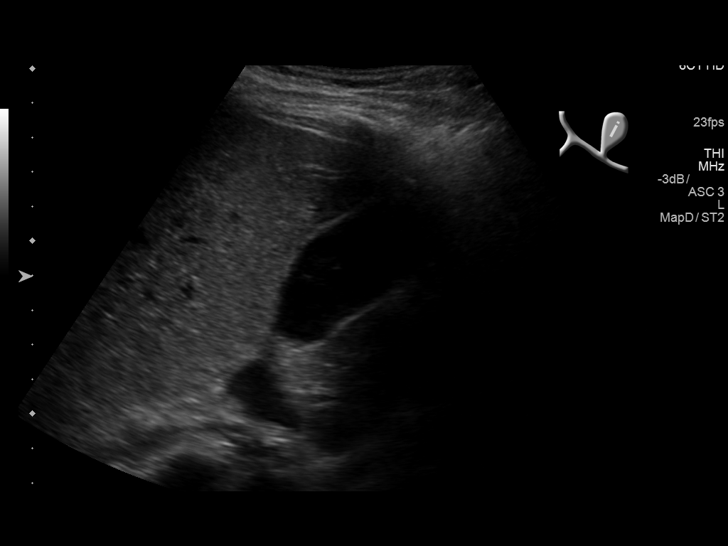
[im 14/37]
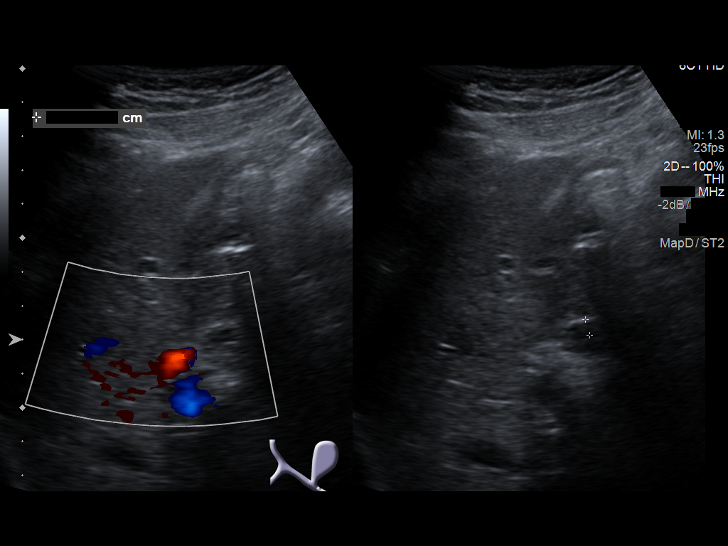
[im 17/37]
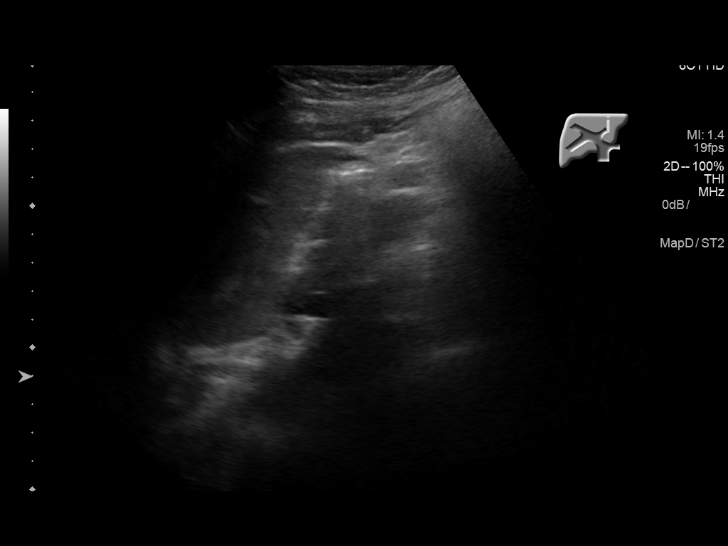
[im 20/37]
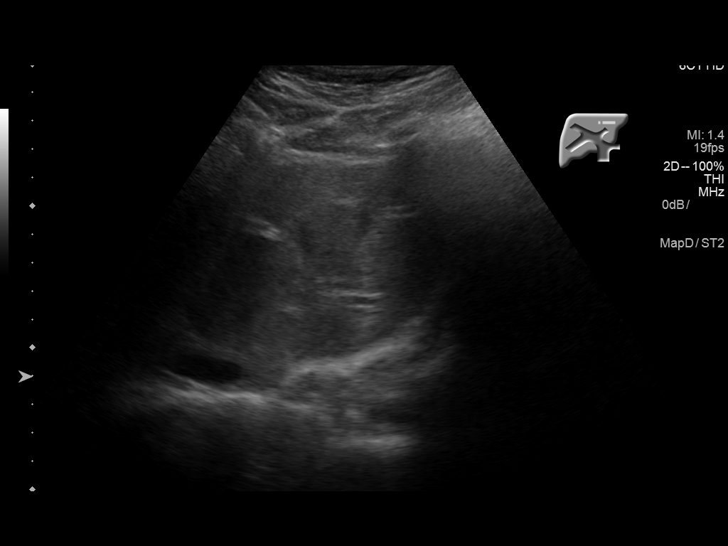
[im 23/37]
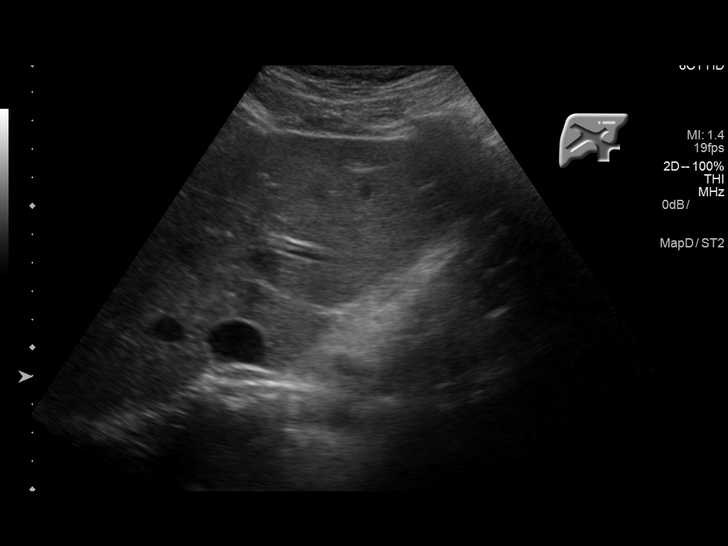
[im 25/37]
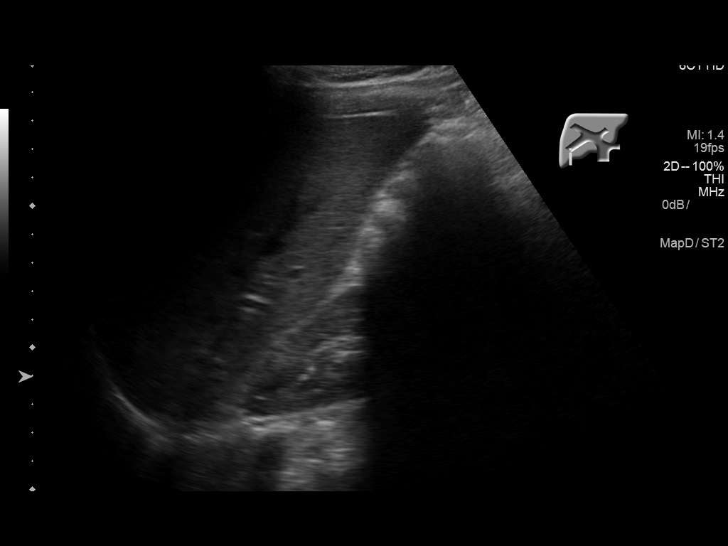
[im 28/37]
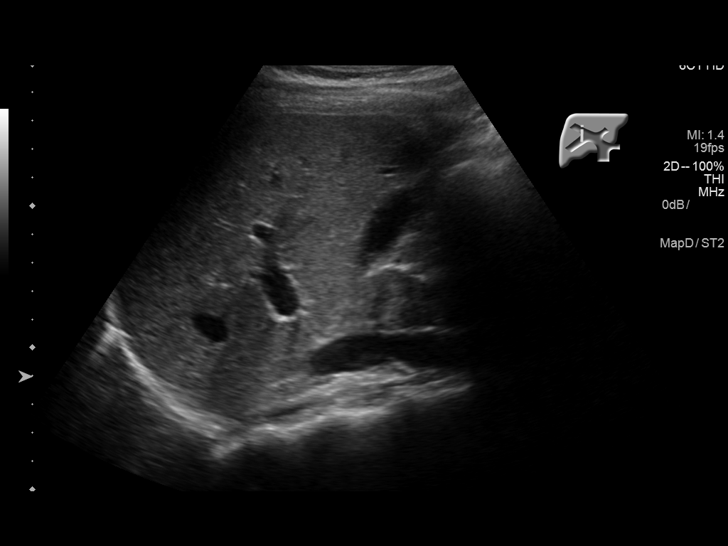
[im 31/37]
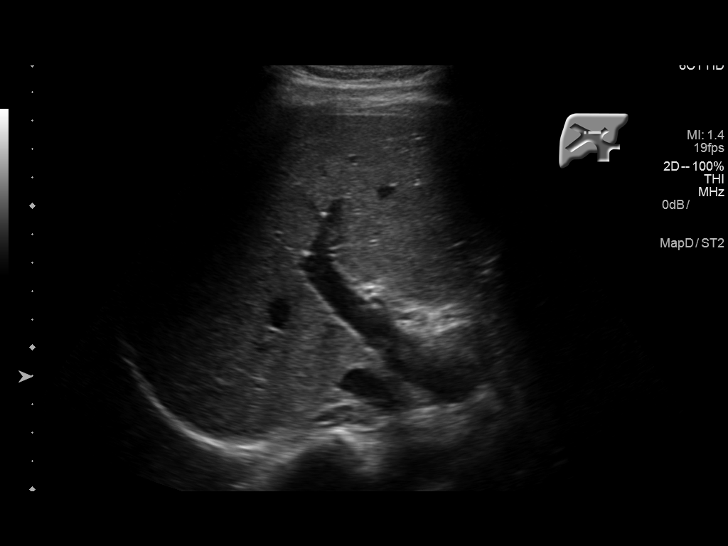
[im 34/37]
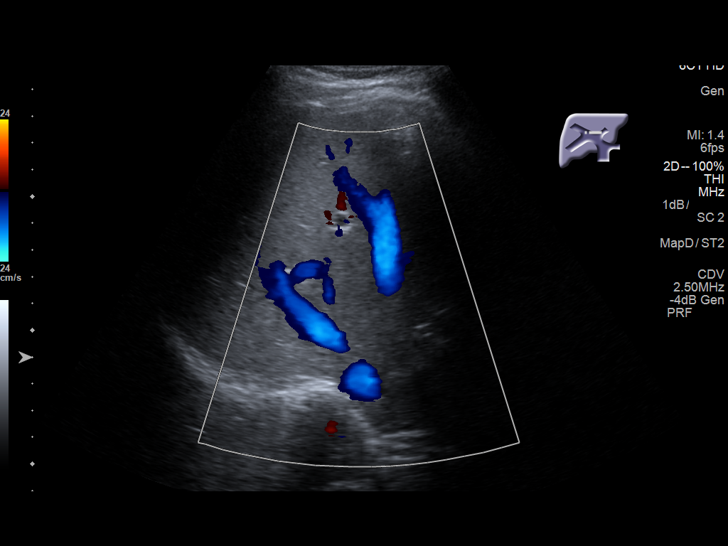
[im 37/37]
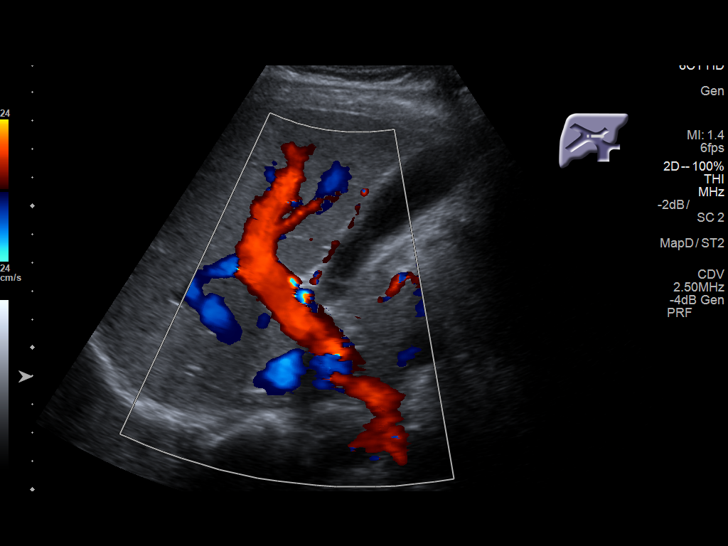

[14 of 25 positions shown; findings below may reference images not displayed]

FINDINGS: Gallbladder:

No gallstones or wall thickening visualized. There is no
pericholecystic fluid. No sonographic Murphy sign noted by
sonographer.

Common bile duct:

Diameter: 5 mm. No intrahepatic or extrahepatic biliary duct
dilatation.

Liver:

No focal lesion identified. Within normal limits in parenchymal
echogenicity.
IMPRESSION: Study within normal limits.

## 2017-08-26 ENCOUNTER — Other Ambulatory Visit: Payer: Self-pay | Admitting: Obstetrics & Gynecology

## 2017-08-26 ENCOUNTER — Encounter: Payer: Self-pay | Admitting: Women's Health

## 2017-09-01 ENCOUNTER — Encounter: Payer: Self-pay | Admitting: Women's Health

## 2017-09-01 DIAGNOSIS — Z029 Encounter for administrative examinations, unspecified: Secondary | ICD-10-CM

## 2017-09-04 ENCOUNTER — Other Ambulatory Visit: Payer: Self-pay | Admitting: Women's Health

## 2017-09-04 ENCOUNTER — Encounter: Payer: Self-pay | Admitting: Women's Health

## 2017-09-04 MED ORDER — PANTOPRAZOLE SODIUM 20 MG PO TBEC: 20.0000 mg | DELAYED_RELEASE_TABLET | Freq: Every day | ORAL | 11 refills | Status: DC

## 2017-09-05 ENCOUNTER — Other Ambulatory Visit: Payer: Self-pay | Admitting: Adult Health

## 2017-09-05 ENCOUNTER — Encounter: Payer: Self-pay | Admitting: Women's Health

## 2017-09-05 MED ORDER — PROMETHAZINE HCL 25 MG PO TABS: 25.0000 mg | ORAL_TABLET | Freq: Four times a day (QID) | ORAL | 1 refills | Status: DC | PRN

## 2017-09-05 NOTE — Progress Notes (Signed)
Will rx phenergan  

## 2017-09-18 ENCOUNTER — Encounter: Payer: Self-pay | Admitting: Women's Health

## 2017-09-18 ENCOUNTER — Ambulatory Visit (INDEPENDENT_AMBULATORY_CARE_PROVIDER_SITE_OTHER): Payer: Self-pay | Admitting: Women's Health

## 2017-09-18 VITALS — BP 112/70 | HR 98 | Wt 172.0 lb

## 2017-09-18 DIAGNOSIS — Z3482 Encounter for supervision of other normal pregnancy, second trimester: Secondary | ICD-10-CM

## 2017-09-18 DIAGNOSIS — Z363 Encounter for antenatal screening for malformations: Secondary | ICD-10-CM

## 2017-09-18 DIAGNOSIS — Z3A13 13 weeks gestation of pregnancy: Secondary | ICD-10-CM

## 2017-09-18 DIAGNOSIS — F418 Other specified anxiety disorders: Secondary | ICD-10-CM

## 2017-09-18 DIAGNOSIS — Z331 Pregnant state, incidental: Secondary | ICD-10-CM

## 2017-09-18 DIAGNOSIS — O99341 Other mental disorders complicating pregnancy, first trimester: Secondary | ICD-10-CM

## 2017-09-18 DIAGNOSIS — Z1389 Encounter for screening for other disorder: Secondary | ICD-10-CM

## 2017-09-18 DIAGNOSIS — O219 Vomiting of pregnancy, unspecified: Secondary | ICD-10-CM

## 2017-09-18 DIAGNOSIS — Z3481 Encounter for supervision of other normal pregnancy, first trimester: Secondary | ICD-10-CM

## 2017-09-18 LAB — POCT URINALYSIS DIPSTICK
Glucose, UA: NEGATIVE
Ketones, UA: NEGATIVE
Leukocytes, UA: NEGATIVE
Nitrite, UA: NEGATIVE
Protein, UA: NEGATIVE
RBC UA: NEGATIVE

## 2017-09-18 NOTE — Progress Notes (Signed)
LOW-RISK PREGNANCY VISIT Patient name: Charlotte Mccormick MRN [id number]  Date of birth: 1986-02-12 Chief Complaint:   Routine Prenatal Visit  History of Present Illness:   Charlotte Mccormick is a 32 y.o. G76P2002 female at [redacted]w[redacted]d with an Estimated Date of Delivery: 03/21/18 being seen today for ongoing management of a low-risk pregnancy.  Today she reports feels better this pregnancy than last. Feels nausea and depression are a little better this time. Phenergan helps w/ nausea, but makes her sleepy. Is on wellbutrin and zoloft for depression, counseling w/ [facility]- getting ready to start 68mth intensive therapy w/ [facility]. Had suicidal thoughts couple of weeks ago, was going to check herself into hospital, but didn't have anyone to watch kids. Denies current suicidial thoughts. Has since talked to a friend who says she will watch kids if needed. Only vomits once daily..  Krista Blue. Bleeding: None.  Movement: Absent. denies leaking of fluid. Review of Systems:   Pertinent items are noted in HPI Denies abnormal vaginal discharge w/ itching/odor/irritation, headaches, visual changes, shortness of breath, chest pain, abdominal pain, severe nausea/vomiting, or problems with urination or bowel movements unless otherwise stated above. Pertinent History Reviewed:  Reviewed past medical,surgical, social, obstetrical and family history.  Reviewed problem list, medications and allergies. Physical Assessment:   Vitals:   09/18/17 1400  BP: 112/70  Pulse: 98  Weight: 172 lb (78 kg)  Body mass index is 31.46 kg/m.        Physical Examination:   General appearance: Well appearing, and in no distress  Mental status: Alert, oriented to person, place, and time  Skin: Warm & dry  Cardiovascular: Normal heart rate noted  Respiratory: Normal respiratory effort, no distress  Abdomen: Soft, gravid, nontender  Pelvic: Cervical exam deferred         Extremities: Edema: None  Fetal Status: Fetal Heart  Rate (bpm): 140   Movement: Absent    Results for orders placed or performed in visit on 09/18/17 (from the past 24 hour(s))  POCT Urinalysis Dipstick   Collection Time: 09/18/17  2:03 PM  Result Value Ref Range   Color, UA     Clarity, UA     Glucose, UA neg    Bilirubin, UA     Ketones, UA neg    Spec Grav, UA  1.010 - 1.025   Blood, UA neg    pH, UA  5.0 - 8.0   Protein, UA neg    Urobilinogen, UA  0.2 or 1.0 E.U./dL   Nitrite, UA neg    Leukocytes, UA Negative Negative   Appearance     Odor      Assessment & Plan:  1) Low-risk pregnancy G3P2002 at [redacted]w[redacted]d with an Estimated Date of Delivery: 03/21/18   2) N/V, controlled w/ phenergan, no s/s dehydration, wt loss  3) Dep/anx w/ SI couple of weeks ago> continue wellbutrin/zoloft, counseling w/ [facility]- getting ready to start intensive therapy. Call us/go to ED for return of suicidal ideations.    Meds: No orders of the defined types were placed in this encounter.  Labs/procedures today: none  Plan:  Continue routine obstetrical care   Reviewed: Preterm labor symptoms and general obstetric precautions including but not limited to vaginal bleeding, contractions, leaking of fluid and fetal movement were reviewed in detail with the patient.  All questions were answered  Follow-up: Return in about 1 month (around 10/17/2017) for LROB, [id number].  Orders Placed This Encounter  Procedures  . US OB Comp +  14 Wk  . POCT Urinalysis Dipstick   [place], [facility] 09/18/2017 2:56 PM

## 2017-09-18 NOTE — Patient Instructions (Signed)
Charlotte Mccormick, I greatly value your feedback.  If you receive a survey following your visit with [place] today, we appreciate you taking the time to fill it out.  Thanks, [name], CNM, WHNP-BC   Second Trimester of Pregnancy The second trimester is from week 14 through week 27 (months 4 through 6). The second trimester is often a time when you feel your best. Your body has adjusted to being pregnant, and you begin to feel better physically. Usually, morning sickness has lessened or quit completely, you may have more energy, and you may have an increase in appetite. The second trimester is also a time when the fetus is growing rapidly. At the end of the sixth month, the fetus is about 9 inches long and weighs about 1 pounds. You will likely begin to feel the baby move (quickening) between 16 and 20 weeks of pregnancy. Body changes during your second trimester Your body continues to go through many changes during your second trimester. The changes vary from woman to woman.  Your weight will continue to increase. You will notice your lower abdomen bulging out.  You may begin to get stretch marks on your hips, abdomen, and breasts.  You may develop headaches that can be relieved by medicines. The medicines should be approved by your health care provider.  You may urinate more often because the fetus is pressing on your bladder.  You may develop or continue to have heartburn as a result of your pregnancy.  You may develop constipation because certain hormones are causing the muscles that push waste through your intestines to slow down.  You may develop hemorrhoids or swollen, bulging veins (varicose veins).  You may have back pain. This is caused by: ? Weight gain. ? Pregnancy hormones that are relaxing the joints in your pelvis. ? A shift in weight and the muscles that support your balance.  Your breasts will continue to grow and they will continue to become tender.  Your gums may  bleed and may be sensitive to brushing and flossing.  Dark spots or blotches (chloasma, mask of pregnancy) may develop on your face. This will likely fade after the baby is born.  A dark line from your belly button to the pubic area (linea nigra) may appear. This will likely fade after the baby is born.  You may have changes in your hair. These can include thickening of your hair, rapid growth, and changes in texture. Some women also have hair loss during or after pregnancy, or hair that feels dry or thin. Your hair will most likely return to normal after your baby is born.  What to expect at prenatal visits During a routine prenatal visit:  You will be weighed to make sure you and the fetus are growing normally.  Your blood pressure will be taken.  Your abdomen will be measured to track your baby's growth.  The fetal heartbeat will be listened to.  Any test results from the previous visit will be discussed.  Your health care provider may ask you:  How you are feeling.  If you are feeling the baby move.  If you have had any abnormal symptoms, such as leaking fluid, bleeding, severe headaches, or abdominal cramping.  If you are using any tobacco products, including cigarettes, chewing tobacco, and electronic cigarettes.  If you have any questions.  Other tests that may be performed during your second trimester include:  Blood tests that check for: ? Low iron levels (anemia). ? High  blood sugar that affects pregnant women (gestational diabetes) between 42 and 28 weeks. ? Rh antibodies. This is to check for a protein on red blood cells (Rh factor).  Urine tests to check for infections, diabetes, or protein in the urine.  An ultrasound to confirm the proper growth and development of the baby.  An amniocentesis to check for possible genetic problems.  Fetal screens for spina bifida and Down syndrome.  HIV (human immunodeficiency virus) testing. Routine prenatal testing  includes screening for HIV, unless you choose not to have this test.  Follow these instructions at home: Medicines  Follow your health care provider's instructions regarding medicine use. Specific medicines may be either safe or unsafe to take during pregnancy.  Take a prenatal vitamin that contains at least 600 micrograms (mcg) of folic acid.  If you develop constipation, try taking a stool softener if your health care provider approves. Eating and drinking  Eat a balanced diet that includes fresh fruits and vegetables, whole grains, good sources of protein such as meat, eggs, or tofu, and low-fat dairy. Your health care provider will help you determine the amount of weight gain that is right for you.  Avoid raw meat and uncooked cheese. These carry germs that can cause birth defects in the baby.  If you have low calcium intake from food, talk to your health care provider about whether you should take a daily calcium supplement.  Limit foods that are high in fat and processed sugars, such as fried and sweet foods.  To prevent constipation: ? Drink enough fluid to keep your urine clear or pale yellow. ? Eat foods that are high in fiber, such as fresh fruits and vegetables, whole grains, and beans. Activity  Exercise only as directed by your health care provider. Most women can continue their usual exercise routine during pregnancy. Try to exercise for 30 minutes at least 5 days a week. Stop exercising if you experience uterine contractions.  Avoid heavy lifting, wear low heel shoes, and practice good posture.  A sexual relationship may be continued unless your health care provider directs you otherwise. Relieving pain and discomfort  Wear a good support bra to prevent discomfort from breast tenderness.  Take warm sitz baths to soothe any pain or discomfort caused by hemorrhoids. Use hemorrhoid cream if your health care provider approves.  Rest with your legs elevated if you have  leg cramps or low back pain.  If you develop varicose veins, wear support hose. Elevate your feet for 15 minutes, 3-4 times a day. Limit salt in your diet. Prenatal Care  Write down your questions. Take them to your prenatal visits.  Keep all your prenatal visits as told by your health care provider. This is important. Safety  Wear your seat belt at all times when driving.  Make a list of emergency phone numbers, including numbers for family, friends, the hospital, and police and fire departments. General instructions  Ask your health care provider for a referral to a local prenatal education class. Begin classes no later than the beginning of month 6 of your pregnancy.  Ask for help if you have counseling or nutritional needs during pregnancy. Your health care provider can offer advice or refer you to specialists for help with various needs.  Do not use hot tubs, steam rooms, or saunas.  Do not douche or use tampons or scented sanitary pads.  Do not cross your legs for long periods of time.  Avoid cat litter boxes and soil  used by cats. These carry germs that can cause birth defects in the baby and possibly loss of the fetus by miscarriage or stillbirth.  Avoid all smoking, herbs, alcohol, and unprescribed drugs. Chemicals in these products can affect the formation and growth of the baby.  Do not use any products that contain nicotine or tobacco, such as cigarettes and e-cigarettes. If you need help quitting, ask your health care provider.  Visit your dentist if you have not gone yet during your pregnancy. Use a soft toothbrush to brush your teeth and be gentle when you floss. Contact a health care provider if:  You have dizziness.  You have mild pelvic cramps, pelvic pressure, or nagging pain in the abdominal area.  You have persistent nausea, vomiting, or diarrhea.  You have a bad smelling vaginal discharge.  You have pain when you urinate. Get help right away if:  You  have a fever.  You are leaking fluid from your vagina.  You have spotting or bleeding from your vagina.  You have severe abdominal cramping or pain.  You have rapid weight gain or weight loss.  You have shortness of breath with chest pain.  You notice sudden or extreme swelling of your face, hands, ankles, feet, or legs.  You have not felt your baby move in over an hour.  You have severe headaches that do not go away when you take medicine.  You have vision changes. Summary  The second trimester is from week 14 through week 27 (months 4 through 6). It is also a time when the fetus is growing rapidly.  Your body goes through many changes during pregnancy. The changes vary from woman to woman.  Avoid all smoking, herbs, alcohol, and unprescribed drugs. These chemicals affect the formation and growth your baby.  Do not use any tobacco products, such as cigarettes, chewing tobacco, and e-cigarettes. If you need help quitting, ask your health care provider.  Contact your health care provider if you have any questions. Keep all prenatal visits as told by your health care provider. This is important. This information is not intended to replace advice given to you by your health care provider. Make sure you discuss any questions you have with your health care provider. Document Released: 05/14/2001 Document Revised: 10/26/2015 Document Reviewed: 07/21/2012 Elsevier Interactive Patient Education  2017 [employer].

## 2017-09-22 ENCOUNTER — Other Ambulatory Visit: Payer: Self-pay | Admitting: Women's Health

## 2017-09-22 ENCOUNTER — Encounter: Payer: Self-pay | Admitting: Women's Health

## 2017-09-22 MED ORDER — ONDANSETRON HCL 4 MG PO TABS: 4.0000 mg | ORAL_TABLET | Freq: Three times a day (TID) | ORAL | 6 refills | Status: DC | PRN

## 2017-09-30 ENCOUNTER — Encounter: Payer: Self-pay | Admitting: Women's Health

## 2017-10-01 ENCOUNTER — Encounter: Payer: Self-pay | Admitting: Women's Health

## 2017-10-01 ENCOUNTER — Other Ambulatory Visit: Payer: Self-pay | Admitting: Women's Health

## 2017-10-01 MED ORDER — PROMETHAZINE HCL 25 MG RE SUPP: 25.0000 mg | Freq: Four times a day (QID) | RECTAL | 6 refills | Status: DC | PRN

## 2017-10-06 ENCOUNTER — Encounter: Payer: Self-pay | Admitting: Women's Health

## 2017-10-13 ENCOUNTER — Encounter (HOSPITAL_COMMUNITY): Payer: Self-pay | Admitting: Emergency Medicine

## 2017-10-13 ENCOUNTER — Other Ambulatory Visit: Payer: Self-pay

## 2017-10-13 DIAGNOSIS — O9989 Other specified diseases and conditions complicating pregnancy, childbirth and the puerperium: Secondary | ICD-10-CM | POA: Insufficient documentation

## 2017-10-13 DIAGNOSIS — Z3A17 17 weeks gestation of pregnancy: Secondary | ICD-10-CM | POA: Insufficient documentation

## 2017-10-13 DIAGNOSIS — Z79899 Other long term (current) drug therapy: Secondary | ICD-10-CM | POA: Insufficient documentation

## 2017-10-13 DIAGNOSIS — E860 Dehydration: Secondary | ICD-10-CM | POA: Insufficient documentation

## 2017-10-13 DIAGNOSIS — Z87891 Personal history of nicotine dependence: Secondary | ICD-10-CM | POA: Insufficient documentation

## 2017-10-13 DIAGNOSIS — K529 Noninfective gastroenteritis and colitis, unspecified: Secondary | ICD-10-CM | POA: Insufficient documentation

## 2017-10-13 DIAGNOSIS — E876 Hypokalemia: Secondary | ICD-10-CM | POA: Insufficient documentation

## 2017-10-13 DIAGNOSIS — O218 Other vomiting complicating pregnancy: Secondary | ICD-10-CM | POA: Insufficient documentation

## 2017-10-13 DIAGNOSIS — J45909 Unspecified asthma, uncomplicated: Secondary | ICD-10-CM | POA: Insufficient documentation

## 2017-10-13 DIAGNOSIS — O99512 Diseases of the respiratory system complicating pregnancy, second trimester: Secondary | ICD-10-CM | POA: Insufficient documentation

## 2017-10-13 NOTE — ED Triage Notes (Signed)
Pt c/o emesis with pregnancy over one week. Pt also c/o generalized weakness.

## 2017-10-14 ENCOUNTER — Encounter: Payer: Self-pay | Admitting: Women's Health

## 2017-10-14 ENCOUNTER — Emergency Department (HOSPITAL_COMMUNITY)
Admission: EM | Admit: 2017-10-14 | Discharge: 2017-10-14 | Disposition: A | Discharge status: Home / Self Care | Payer: Self-pay | Attending: Emergency Medicine | Admitting: Emergency Medicine

## 2017-10-14 DIAGNOSIS — E860 Dehydration: Secondary | ICD-10-CM

## 2017-10-14 DIAGNOSIS — K529 Noninfective gastroenteritis and colitis, unspecified: Secondary | ICD-10-CM

## 2017-10-14 DIAGNOSIS — R112 Nausea with vomiting, unspecified: Secondary | ICD-10-CM

## 2017-10-14 DIAGNOSIS — E876 Hypokalemia: Secondary | ICD-10-CM

## 2017-10-14 LAB — CBC WITH DIFFERENTIAL/PLATELET
BASOS ABS: 0 10*3/uL (ref 0.0–0.1)
Basophils Relative: 0 %
EOS ABS: 0.1 10*3/uL (ref 0.0–0.7)
EOS PCT: 1 %
HCT: 34.3 % — ABNORMAL LOW (ref 36.0–46.0)
Hemoglobin: 10.7 g/dL — ABNORMAL LOW (ref 12.0–15.0)
Lymphocytes Relative: 26 %
Lymphs Abs: 1.4 10*3/uL (ref 0.7–4.0)
MCH: 22.1 pg — ABNORMAL LOW (ref 26.0–34.0)
MCHC: 31.2 g/dL (ref 30.0–36.0)
MCV: 70.9 fL — ABNORMAL LOW (ref 78.0–100.0)
Monocytes Absolute: 0.6 10*3/uL (ref 0.1–1.0)
Monocytes Relative: 11 %
Neutro Abs: 3.5 10*3/uL (ref 1.7–7.7)
Neutrophils Relative %: 62 %
PLATELETS: 248 10*3/uL (ref 150–400)
RBC: 4.84 MIL/uL (ref 3.87–5.11)
RDW: 15.4 % (ref 11.5–15.5)
WBC: 5.6 10*3/uL (ref 4.0–10.5)

## 2017-10-14 LAB — COMPREHENSIVE METABOLIC PANEL
ALT: 12 U/L — ABNORMAL LOW (ref 14–54)
AST: 17 U/L (ref 15–41)
Albumin: 3.6 g/dL (ref 3.5–5.0)
Alkaline Phosphatase: 72 U/L (ref 38–126)
Anion gap: 8 (ref 5–15)
BUN: 6 mg/dL (ref 6–20)
CHLORIDE: 103 mmol/L (ref 101–111)
CO2: 24 mmol/L (ref 22–32)
Calcium: 9 mg/dL (ref 8.9–10.3)
Creatinine, Ser: 0.52 mg/dL (ref 0.44–1.00)
GFR calc Af Amer: >60 mL/min (ref >60)
Glucose, Bld: 100 mg/dL — ABNORMAL HIGH (ref 65–99)
POTASSIUM: 3 mmol/L — AB (ref 3.5–5.1)
Sodium: 135 mmol/L (ref 135–145)
Total Bilirubin: 0.2 mg/dL — ABNORMAL LOW (ref 0.3–1.2)
Total Protein: 7.5 g/dL (ref 6.5–8.1)

## 2017-10-14 LAB — URINALYSIS, ROUTINE W REFLEX MICROSCOPIC
BILIRUBIN URINE: NEGATIVE
Glucose, UA: NEGATIVE mg/dL
HGB URINE DIPSTICK: NEGATIVE
KETONES UR: 5 mg/dL — AB
Leukocytes, UA: NEGATIVE
Nitrite: NEGATIVE
PROTEIN: NEGATIVE mg/dL
Specific Gravity, Urine: 1.03 (ref 1.005–1.030)
pH: 6 (ref 5.0–8.0)

## 2017-10-14 LAB — LACTIC ACID, PLASMA
LACTIC ACID, VENOUS: 1.8 mmol/L (ref 0.5–1.9)
LACTIC ACID, VENOUS: 2.2 mmol/L — AB (ref 0.5–1.9)

## 2017-10-14 MED ORDER — SODIUM CHLORIDE 0.9 % IV BOLUS
1000.0000 mL | Freq: Once | INTRAVENOUS | Status: AC
  Administered 2017-10-14: 1000 mL via INTRAVENOUS

## 2017-10-14 MED ORDER — POTASSIUM CHLORIDE CRYS ER 20 MEQ PO TBCR: 20.0000 meq | EXTENDED_RELEASE_TABLET | Freq: Two times a day (BID) | ORAL | 0 refills | Status: DC

## 2017-10-14 MED ORDER — POTASSIUM CHLORIDE CRYS ER 20 MEQ PO TBCR
40.0000 meq | EXTENDED_RELEASE_TABLET | Freq: Once | ORAL | Status: AC
  Administered 2017-10-14: 40 meq via ORAL
  Filled 2017-10-14: qty 2

## 2017-10-14 MED ORDER — ONDANSETRON HCL 4 MG/2ML IJ SOLN
4.0000 mg | Freq: Once | INTRAMUSCULAR | Status: AC
  Administered 2017-10-14: 4 mg via INTRAVENOUS
  Filled 2017-10-14: qty 2

## 2017-10-14 NOTE — ED Provider Notes (Signed)
[facility] EMERGENCY DEPARTMENT Provider Note   CSN: [id number] Arrival date & time: 10/13/17  2304  Time seen 23:30 PM    History   Chief Complaint Chief Complaint  Patient presents with  . Emesis    Charlotte Mccormick is a 32 y.o. female.  Charlotte patient is P3 G2 Ab0, approximately [redacted] weeks pregnant [facility].  She states her second pregnancy was complicated by hyperemesis gravidarum.  She states she has been having nausea and vomiting throughout this pregnancy and its to the point now it is 2-3 times a day.  She states she is unable to eat or drink any fluids except for fruit punch and Dr. Malachi Bonds.  She states today  she started feeling weak, she denies feeling lightheaded or dizzy but she feels like she is going to pass out.  She also feels some dyspnea on exertion.  She states she had some minimal chest discomfort in the center of her chest.  She denies coughing, diarrhea, or abdominal pain.  She states she is having decreased urinary output and sometimes her mouth feels dry, mainly in the morning.  She states she has been on likely just, Phenergan, and Zofran without improvement.  She was prescribed Phenergan suppositories which she cannot afford to buy.  PCP [name], CNM [facility]  Past Medical History:  Diagnosis Date  . Abnormal uterine bleeding (AUB) 12/12/2015  . Anemia   . Anxiety   . Asthma   . Constipation   . Depression   . Fibroid   . Genital herpes   . GERD (gastroesophageal reflux disease)   . History of chlamydia   . Hyperemesis   . IBS (irritable bowel syndrome)   . Insomnia   . PTSD (post-traumatic stress disorder)   . Sinusitis   . Vaginal dryness 12/12/2015    Patient Active Problem List   Diagnosis Date Noted  . Supervision of normal pregnancy 08/20/2017  . Depression with anxiety 01/23/2017  . PTSD (post-traumatic stress disorder)  05/01/2016    Past Surgical History:  Procedure Laterality  Date  . COLONOSCOPY N/A 04/08/2016   Procedure: COLONOSCOPY;  Surgeon: Danie Binder, MD;  Location: [facility];  Service: Endoscopy;  Laterality: N/A;  10:15 AM  . ESOPHAGOGASTRODUODENOSCOPY N/A 04/08/2016   Procedure: ESOPHAGOGASTRODUODENOSCOPY (EGD);  Surgeon: Danie Binder, MD;  Location: [facility];  Service: Endoscopy;  Laterality: N/A;  . PERINEUM REPAIR       OB History    Gravida  3   Para  2   Term  2   Preterm      AB      Living  2     SAB      TAB      Ectopic      Multiple  0   Live Births  2            Home Medications    Prior to Admission medications   Medication Sig Start Date End Date Taking? Authorizing Provider  albuterol (PROVENTIL HFA;VENTOLIN HFA) 108 (90 Base) MCG/ACT inhaler INHALE 2 PUFFS BY MOUTH EVERY 6 HOURS AS NEEDED FOR WHEEZING OR SHORTNESS OF BREATH 08/26/17   Cresenzo-Dishmon, Joaquim Lai, CNM  buPROPion (WELLBUTRIN SR) 100 MG 12 hr tablet Take 1 tablet (100 mg total) by mouth 2 (two) times daily. 06/26/17   [name], CNM  Calcium Carbonate Antacid (TUMS PO) Take by mouth 3 (three) times  daily.    [provider]  Doxylamine-Pyridoxine (DICLEGIS) 10-10 MG TBEC Take 2 at hs then 1 in am and 1 in afternoon Patient not taking: Reported on 09/18/2017 07/24/17   Estill Dooms, NP  ferrous sulfate 325 (65 FE) MG EC tablet Take 325 mg by mouth daily with breakfast.     [provider]  ondansetron (ZOFRAN) 4 MG tablet Take 1 tablet (4 mg total) by mouth every 8 (eight) hours as needed for nausea or vomiting. 09/22/17   [name], CNM  pantoprazole (PROTONIX) 20 MG tablet Take 1 tablet (20 mg total) by mouth daily. Patient not taking: Reported on 09/18/2017 09/04/17   [name], CNM  potassium chloride SA (K-DUR,KLOR-CON) 20 MEQ tablet Take 1 tablet (20 mEq total) by mouth 2 (two) times daily. 10/14/17   Rolland Porter, MD  Prenat-Fe Carbonyl-FA-Omega 3 (ONE-A-DAY WOMENS PRENATAL 1) 28-0.8-235 MG  CAPS Take 1 capsule by mouth daily. 07/24/17   Estill Dooms, NP  promethazine (PHENERGAN) 25 MG suppository Place 1 suppository (25 mg total) rectally every 6 (six) hours as needed for nausea or vomiting. 10/01/17   [name], CNM  promethazine (PHENERGAN) 25 MG tablet Take 1 tablet (25 mg total) by mouth every 6 (six) hours as needed for nausea or vomiting. 09/05/17   Estill Dooms, NP  PROVENTIL HFA 108 (90 Base) MCG/ACT inhaler INHALE 2 PUFFS INTO THE LUNGS EVERY 6 HOURS AS NEEDED FOR WHEEZING OR SHORTNESS OF BREATH 12/11/16   Cresenzo-Dishmon, Joaquim Lai, CNM  sertraline (ZOLOFT) 50 MG tablet Take 1 tablet (50 mg total) by mouth daily. 06/26/17   [name], CNM    Family History Family History  Problem Relation Age of Onset  . Heart disease Mother   . Hypertension Mother   . Hypertension Father   . Hyperlipidemia Father   . Diabetes Father   . Cancer Father   . Epilepsy Brother   . Eczema Daughter   . Colon cancer Neg Hx     Social History Social History   Tobacco Use  . Smoking status: Former Smoker    Packs/day: 0.50    Years: 5.00    Pack years: 2.50    Types: Cigarettes    Last attempt to quit: 06/03/2010    Years since quitting: 7.3  . Smokeless tobacco: Never Used  Substance Use Topics  . Alcohol use: No    Frequency: Never    Comment: occ wine; before preg  . Drug use: No  applying for disability for PTSD, anxiety and depression Denies smoking and alcohol  Allergies   Bee venom; Penicillins; and Reglan [metoclopramide]   Review of Systems Review of Systems  All other systems reviewed and are negative.    Physical Exam Updated Vital Signs BP 126/68 (BP Location: Right Arm)   Pulse 88   Temp 98.3 F (36.8 C) (Oral)   Resp 20   Ht 5\' 2"  (1.575 m)   Wt 76.8 kg (169 lb 6.4 oz)   LMP 06/14/2017   SpO2 100%   BMI 30.98 kg/m   Vital signs normal    Physical Exam  Constitutional: She is oriented to person, place, and time. She  appears well-developed and well-nourished.  Non-toxic appearance. She does not appear ill. No distress.  HENT:  Head: Normocephalic.  Right Ear: External ear normal.  Left Ear: External ear normal.  Nose: Nose normal. No mucosal edema or rhinorrhea.  Mouth/Throat: Mucous membranes are dry. No  dental abscesses or uvula swelling.  Eyes: Pupils are equal, round, and reactive to light. Conjunctivae and EOM are normal.  Neck: Normal range of motion and full passive range of motion without pain. Neck supple.  Cardiovascular: Normal rate, regular rhythm and normal heart sounds. Exam reveals no gallop and no friction rub.  No murmur heard. Pulmonary/Chest: Effort normal and breath sounds normal. No respiratory distress. She has no wheezes. She has no rhonchi. She has no rales. She exhibits no tenderness and no crepitus.  Abdominal: Soft. Normal appearance and bowel sounds are normal. She exhibits no distension. There is no tenderness. There is no rebound and no guarding.  Musculoskeletal: Normal range of motion. She exhibits no edema or tenderness.  Moves all extremities well.   Neurological: She is alert and oriented to person, place, and time. She has normal strength. No cranial nerve deficit.  Skin: Skin is warm, dry and intact. No rash noted. No erythema. No pallor.  Psychiatric: She has a normal mood and affect. Her speech is normal and behavior is normal. Her mood appears not anxious.  Nursing note and vitals reviewed.    ED Treatments / Results  Labs (all labs ordered are listed, but only abnormal results are displayed) Results for orders placed or performed during the hospital encounter of 10/14/17  Urinalysis, Routine w reflex microscopic  Result Value Ref Range   Color, Urine YELLOW YELLOW   APPearance CLOUDY (A) CLEAR   Specific Gravity, Urine 1.030 1.005 - 1.030   pH 6.0 5.0 - 8.0   Glucose, UA NEGATIVE NEGATIVE mg/dL   Hgb urine dipstick NEGATIVE NEGATIVE   Bilirubin Urine  NEGATIVE NEGATIVE   Ketones, ur 5 (A) NEGATIVE mg/dL   Protein, ur NEGATIVE NEGATIVE mg/dL   Nitrite NEGATIVE NEGATIVE   Leukocytes, UA NEGATIVE NEGATIVE  CBC with Differential  Result Value Ref Range   WBC 5.6 4.0 - 10.5 K/uL   RBC 4.84 3.87 - 5.11 MIL/uL   Hemoglobin 10.7 (L) 12.0 - 15.0 g/dL   HCT 34.3 (L) 36.0 - 46.0 %   MCV 70.9 (L) 78.0 - 100.0 fL   MCH 22.1 (L) 26.0 - 34.0 pg   MCHC 31.2 30.0 - 36.0 g/dL   RDW 15.4 11.5 - 15.5 %   Platelets 248 150 - 400 K/uL   Neutrophils Relative % 62 %   Neutro Abs 3.5 1.7 - 7.7 K/uL   Lymphocytes Relative 26 %   Lymphs Abs 1.4 0.7 - 4.0 K/uL   Monocytes Relative 11 %   Monocytes Absolute 0.6 0.1 - 1.0 K/uL   Eosinophils Relative 1 %   Eosinophils Absolute 0.1 0.0 - 0.7 K/uL   Basophils Relative 0 %   Basophils Absolute 0.0 0.0 - 0.1 K/uL  Comprehensive metabolic panel  Result Value Ref Range   Sodium 135 135 - 145 mmol/L   Potassium 3.0 (L) 3.5 - 5.1 mmol/L   Chloride 103 101 - 111 mmol/L   CO2 24 22 - 32 mmol/L   Glucose, Bld 100 (H) 65 - 99 mg/dL   BUN 6 6 - 20 mg/dL   Creatinine, Ser 0.52 0.44 - 1.00 mg/dL   Calcium 9.0 8.9 - 10.3 mg/dL   Total Protein 7.5 6.5 - 8.1 g/dL   Albumin 3.6 3.5 - 5.0 g/dL   AST 17 15 - 41 U/L   ALT 12 (L) 14 - 54 U/L   Alkaline Phosphatase 72 38 - 126 U/L   Total Bilirubin 0.2 (L) 0.3 -  1.2 mg/dL   GFR calc non Af Amer >60 >60 mL/min   GFR calc Af Amer >60 >60 mL/min   Anion gap 8 5 - 15  Lactic acid, plasma  Result Value Ref Range   Lactic Acid, Venous 2.2 (HH) 0.5 - 1.9 mmol/L  Lactic acid, plasma  Result Value Ref Range   Lactic Acid, Venous 1.8 0.5 - 1.9 mmol/L   Laboratory interpretation all normal except mild anemia, hypokalemia, elevated lactic acid that improved with IV fluids    EKG None  Radiology No results found.  Procedures Procedures (including critical care time)  Medications Ordered in ED Medications  sodium chloride 0.9 % bolus 1,000 mL (0 mLs Intravenous  Stopped 10/14/17 0209)  sodium chloride 0.9 % bolus 1,000 mL (0 mLs Intravenous Stopped 10/14/17 0329)  ondansetron (ZOFRAN) injection 4 mg (4 mg Intravenous Given 10/14/17 0050)  sodium chloride 0.9 % bolus 1,000 mL (1,000 mLs Intravenous New Bag/Given 10/14/17 0331)  potassium chloride SA (K-DUR,KLOR-CON) CR tablet 40 mEq (40 mEq Oral Given 10/14/17 0331)     Initial Impression / Assessment and Plan / ED Course  I have reviewed the triage vital signs and the nursing notes.  Pertinent labs & imaging results that were available during my care of the patient were reviewed by me and considered in my medical decision making (see chart for details).      Patient was given IV fluids for dehydration, I told her we would start with IV Zofran and see if that would control her vomiting.  Recheck at 3 AM patient has had her 2 L IV fluids, she reports no urinary output since.  However she is drinking Sprite and eating crackers.  She was given an additional liter of normal saline and we discussed her test results.  She felt like she could take the oral potassium pill.  5:10 AM patient is pacing around the room, her IV is not running.  She states  Final Clinical Impressions(s) / ED Diagnoses   Final diagnoses:  Hypokalemia  Non-intractable vomiting with nausea, unspecified vomiting type  Dehydration  Gastroenteritis    ED Discharge Orders        Ordered    potassium chloride SA (K-DUR,KLOR-CON) 20 MEQ tablet  2 times daily     10/14/17 0522      Plan discharge  Rolland Porter, MD, Barbette Or, MD 10/14/17 [phone]

## 2017-10-14 NOTE — ED Notes (Signed)
Date and time results received: 10/14/17 0120   Test: Lactic Acid Critical Value: 2.2  Name of Provider Notified: Rolland Porter MD

## 2017-10-14 NOTE — Discharge Instructions (Addendum)
Try to drink plenty of fluids. Take the potassium pills until gone. Please call [facility] to see what nausea medication they want you to be on.

## 2017-10-16 ENCOUNTER — Ambulatory Visit (INDEPENDENT_AMBULATORY_CARE_PROVIDER_SITE_OTHER): Payer: Self-pay | Admitting: Advanced Practice Midwife

## 2017-10-16 ENCOUNTER — Ambulatory Visit (INDEPENDENT_AMBULATORY_CARE_PROVIDER_SITE_OTHER): Payer: Self-pay

## 2017-10-16 ENCOUNTER — Other Ambulatory Visit: Payer: Self-pay

## 2017-10-16 VITALS — BP 108/64 | HR 82 | Wt 169.0 lb

## 2017-10-16 DIAGNOSIS — Z363 Encounter for antenatal screening for malformations: Secondary | ICD-10-CM

## 2017-10-16 DIAGNOSIS — Z1389 Encounter for screening for other disorder: Secondary | ICD-10-CM

## 2017-10-16 DIAGNOSIS — Z3482 Encounter for supervision of other normal pregnancy, second trimester: Secondary | ICD-10-CM

## 2017-10-16 DIAGNOSIS — Z3A17 17 weeks gestation of pregnancy: Secondary | ICD-10-CM

## 2017-10-16 DIAGNOSIS — Z331 Pregnant state, incidental: Secondary | ICD-10-CM

## 2017-10-16 LAB — POCT URINALYSIS DIPSTICK
Blood, UA: NEGATIVE
Glucose, UA: NEGATIVE
Ketones, UA: NEGATIVE
LEUKOCYTES UA: NEGATIVE
NITRITE UA: NEGATIVE
Protein, UA: NEGATIVE

## 2017-10-16 NOTE — Progress Notes (Signed)
[place] 81+2 wks,cephalic,cx 5.2 cm,posterior pl gr 0,normal ovaries bilat,svp of fluid 3.8 cm,fhr 133 bpm,efw 222 g 66%,anatomy complete,no obvious abnormalities

## 2017-10-16 NOTE — Progress Notes (Signed)
  [id number] [redacted]w[redacted]d Estimated Date of Delivery: 03/21/18  Blood pressure 108/64, pulse 82, weight 169 lb (76.7 kg), last menstrual period 06/14/2017, not currently breastfeeding.   BP weight and urine results all reviewed and noted.  Please refer to the obstetrical flow sheet for the fundal height and fetal heart rate documentation:  Patient denies any bleeding and no rupture of membranes symptoms or regular contractions.   Went to ED 2 days ago and got IVF/zofran.  Has rx for phenergan supp but can't afford to pick them up. Today feeling better.  Weight is down 2# this month. `.    Can't drink milk products.  Try Carnation instant breakfast powder blended w/ice  On waitt list for ntensive therapy w/[facility] for dep/anx. Better relationship w/current therapist at [facility] On wellbutrin and zoloft. Has not had any more suicidal thoughts.   All questions were answered.   Physical Assessment:   Vitals:   10/16/17 1223  BP: 108/64  Pulse: 82  Weight: 169 lb (76.7 kg)  Body mass index is 30.91 kg/m.        Physical Examination:   General appearance: Well appearing, and in no distress  Mental status: Alert, oriented to person, place, and time  Skin: Warm & dry  Cardiovascular: Normal heart rate noted  Respiratory: Normal respiratory effort, no distress  Abdomen: Soft, gravid, nontender  Pelvic: Cervical exam deferred         Extremities: Edema: None  Fetal Status:     Movement: Absent   [place] 12+2 wks,cephalic,cx 5.2 cm,posterior pl gr 0,normal ovaries bilat,svp of fluid 3.8 cm,fhr 133 bpm,efw 222 g 66%,anatomy complete,no obvious abnormalities     Results for orders placed or performed in visit on 10/16/17 (from the past 24 hour(s))  POCT urinalysis dipstick   Collection Time: 10/16/17 12:24 PM  Result Value Ref Range   Color, UA     Clarity, UA     Glucose, UA neg    Bilirubin, UA     Ketones, UA neg    Spec Grav, UA  1.010 - 1.025   Blood, UA neg    pH, UA  5.0 - 8.0   Protein, UA neg    Urobilinogen, UA  0.2 or 1.0 E.U./dL   Nitrite, UA neg    Leukocytes, UA Negative Negative   Appearance     Odor       Orders Placed This Encounter  Procedures  . POCT urinalysis dipstick    Plan:  Continued routine obstetrical care,   Return in about 1 month (around 11/13/2017) for [facility].

## 2017-10-17 ENCOUNTER — Encounter: Payer: Self-pay | Admitting: Advanced Practice Midwife

## 2017-10-20 ENCOUNTER — Encounter: Payer: Self-pay | Admitting: Advanced Practice Midwife

## 2017-10-31 ENCOUNTER — Encounter: Payer: Self-pay | Admitting: Women's Health

## 2017-11-07 ENCOUNTER — Telehealth: Payer: Self-pay | Admitting: *Deleted

## 2017-11-07 ENCOUNTER — Encounter: Payer: Self-pay | Admitting: *Deleted

## 2017-11-07 NOTE — Telephone Encounter (Signed)
LMOVM but will send mychart message.

## 2017-11-11 ENCOUNTER — Encounter: Payer: Self-pay | Admitting: Women's Health

## 2017-11-13 ENCOUNTER — Ambulatory Visit (INDEPENDENT_AMBULATORY_CARE_PROVIDER_SITE_OTHER): Payer: Self-pay | Admitting: Women's Health

## 2017-11-13 ENCOUNTER — Encounter: Payer: Self-pay | Admitting: Women's Health

## 2017-11-13 VITALS — BP 97/58 | HR 90 | Wt 168.8 lb

## 2017-11-13 DIAGNOSIS — R5383 Other fatigue: Secondary | ICD-10-CM

## 2017-11-13 DIAGNOSIS — F418 Other specified anxiety disorders: Secondary | ICD-10-CM

## 2017-11-13 DIAGNOSIS — Z3482 Encounter for supervision of other normal pregnancy, second trimester: Secondary | ICD-10-CM

## 2017-11-13 DIAGNOSIS — Z1389 Encounter for screening for other disorder: Secondary | ICD-10-CM

## 2017-11-13 DIAGNOSIS — O99342 Other mental disorders complicating pregnancy, second trimester: Secondary | ICD-10-CM

## 2017-11-13 DIAGNOSIS — O9989 Other specified diseases and conditions complicating pregnancy, childbirth and the puerperium: Secondary | ICD-10-CM

## 2017-11-13 DIAGNOSIS — Z3A21 21 weeks gestation of pregnancy: Secondary | ICD-10-CM

## 2017-11-13 DIAGNOSIS — Z331 Pregnant state, incidental: Secondary | ICD-10-CM

## 2017-11-13 LAB — POCT URINALYSIS DIPSTICK
Blood, UA: NEGATIVE
Glucose, UA: NEGATIVE
Ketones, UA: NEGATIVE
Leukocytes, UA: NEGATIVE
NITRITE UA: NEGATIVE
PROTEIN UA: NEGATIVE

## 2017-11-13 NOTE — Patient Instructions (Signed)
Charlotte Mccormick, I greatly value your feedback.  If you receive a survey following your visit with [place] today, we appreciate you taking the time to fill it out.  Thanks, [name], CNM, WHNP-BC   Second Trimester of Pregnancy The second trimester is from week 14 through week 27 (months 4 through 6). The second trimester is often a time when you feel your best. Your body has adjusted to being pregnant, and you begin to feel better physically. Usually, morning sickness has lessened or quit completely, you may have more energy, and you may have an increase in appetite. The second trimester is also a time when the fetus is growing rapidly. At the end of the sixth month, the fetus is about 9 inches long and weighs about 1 pounds. You will likely begin to feel the baby move (quickening) between 16 and 20 weeks of pregnancy. Body changes during your second trimester Your body continues to go through many changes during your second trimester. The changes vary from woman to woman.  Your weight will continue to increase. You will notice your lower abdomen bulging out.  You may begin to get stretch marks on your hips, abdomen, and breasts.  You may develop headaches that can be relieved by medicines. The medicines should be approved by your health care provider.  You may urinate more often because the fetus is pressing on your bladder.  You may develop or continue to have heartburn as a result of your pregnancy.  You may develop constipation because certain hormones are causing the muscles that push waste through your intestines to slow down.  You may develop hemorrhoids or swollen, bulging veins (varicose veins).  You may have back pain. This is caused by: ? Weight gain. ? Pregnancy hormones that are relaxing the joints in your pelvis. ? A shift in weight and the muscles that support your balance.  Your breasts will continue to grow and they will continue to become tender.  Your gums may  bleed and may be sensitive to brushing and flossing.  Dark spots or blotches (chloasma, mask of pregnancy) may develop on your face. This will likely fade after the baby is born.  A dark line from your belly button to the pubic area (linea nigra) may appear. This will likely fade after the baby is born.  You may have changes in your hair. These can include thickening of your hair, rapid growth, and changes in texture. Some women also have hair loss during or after pregnancy, or hair that feels dry or thin. Your hair will most likely return to normal after your baby is born.  What to expect at prenatal visits During a routine prenatal visit:  You will be weighed to make sure you and the fetus are growing normally.  Your blood pressure will be taken.  Your abdomen will be measured to track your baby's growth.  The fetal heartbeat will be listened to.  Any test results from the previous visit will be discussed.  Your health care provider may ask you:  How you are feeling.  If you are feeling the baby move.  If you have had any abnormal symptoms, such as leaking fluid, bleeding, severe headaches, or abdominal cramping.  If you are using any tobacco products, including cigarettes, chewing tobacco, and electronic cigarettes.  If you have any questions.  Other tests that may be performed during your second trimester include:  Blood tests that check for: ? Low iron levels (anemia). ? High  blood sugar that affects pregnant women (gestational diabetes) between 42 and 28 weeks. ? Rh antibodies. This is to check for a protein on red blood cells (Rh factor).  Urine tests to check for infections, diabetes, or protein in the urine.  An ultrasound to confirm the proper growth and development of the baby.  An amniocentesis to check for possible genetic problems.  Fetal screens for spina bifida and Down syndrome.  HIV (human immunodeficiency virus) testing. Routine prenatal testing  includes screening for HIV, unless you choose not to have this test.  Follow these instructions at home: Medicines  Follow your health care provider's instructions regarding medicine use. Specific medicines may be either safe or unsafe to take during pregnancy.  Take a prenatal vitamin that contains at least 600 micrograms (mcg) of folic acid.  If you develop constipation, try taking a stool softener if your health care provider approves. Eating and drinking  Eat a balanced diet that includes fresh fruits and vegetables, whole grains, good sources of protein such as meat, eggs, or tofu, and low-fat dairy. Your health care provider will help you determine the amount of weight gain that is right for you.  Avoid raw meat and uncooked cheese. These carry germs that can cause birth defects in the baby.  If you have low calcium intake from food, talk to your health care provider about whether you should take a daily calcium supplement.  Limit foods that are high in fat and processed sugars, such as fried and sweet foods.  To prevent constipation: ? Drink enough fluid to keep your urine clear or pale yellow. ? Eat foods that are high in fiber, such as fresh fruits and vegetables, whole grains, and beans. Activity  Exercise only as directed by your health care provider. Most women can continue their usual exercise routine during pregnancy. Try to exercise for 30 minutes at least 5 days a week. Stop exercising if you experience uterine contractions.  Avoid heavy lifting, wear low heel shoes, and practice good posture.  A sexual relationship may be continued unless your health care provider directs you otherwise. Relieving pain and discomfort  Wear a good support bra to prevent discomfort from breast tenderness.  Take warm sitz baths to soothe any pain or discomfort caused by hemorrhoids. Use hemorrhoid cream if your health care provider approves.  Rest with your legs elevated if you have  leg cramps or low back pain.  If you develop varicose veins, wear support hose. Elevate your feet for 15 minutes, 3-4 times a day. Limit salt in your diet. Prenatal Care  Write down your questions. Take them to your prenatal visits.  Keep all your prenatal visits as told by your health care provider. This is important. Safety  Wear your seat belt at all times when driving.  Make a list of emergency phone numbers, including numbers for family, friends, the hospital, and police and fire departments. General instructions  Ask your health care provider for a referral to a local prenatal education class. Begin classes no later than the beginning of month 6 of your pregnancy.  Ask for help if you have counseling or nutritional needs during pregnancy. Your health care provider can offer advice or refer you to specialists for help with various needs.  Do not use hot tubs, steam rooms, or saunas.  Do not douche or use tampons or scented sanitary pads.  Do not cross your legs for long periods of time.  Avoid cat litter boxes and soil  used by cats. These carry germs that can cause birth defects in the baby and possibly loss of the fetus by miscarriage or stillbirth.  Avoid all smoking, herbs, alcohol, and unprescribed drugs. Chemicals in these products can affect the formation and growth of the baby.  Do not use any products that contain nicotine or tobacco, such as cigarettes and e-cigarettes. If you need help quitting, ask your health care provider.  Visit your dentist if you have not gone yet during your pregnancy. Use a soft toothbrush to brush your teeth and be gentle when you floss. Contact a health care provider if:  You have dizziness.  You have mild pelvic cramps, pelvic pressure, or nagging pain in the abdominal area.  You have persistent nausea, vomiting, or diarrhea.  You have a bad smelling vaginal discharge.  You have pain when you urinate. Get help right away if:  You  have a fever.  You are leaking fluid from your vagina.  You have spotting or bleeding from your vagina.  You have severe abdominal cramping or pain.  You have rapid weight gain or weight loss.  You have shortness of breath with chest pain.  You notice sudden or extreme swelling of your face, hands, ankles, feet, or legs.  You have not felt your baby move in over an hour.  You have severe headaches that do not go away when you take medicine.  You have vision changes. Summary  The second trimester is from week 14 through week 27 (months 4 through 6). It is also a time when the fetus is growing rapidly.  Your body goes through many changes during pregnancy. The changes vary from woman to woman.  Avoid all smoking, herbs, alcohol, and unprescribed drugs. These chemicals affect the formation and growth your baby.  Do not use any tobacco products, such as cigarettes, chewing tobacco, and e-cigarettes. If you need help quitting, ask your health care provider.  Contact your health care provider if you have any questions. Keep all prenatal visits as told by your health care provider. This is important. This information is not intended to replace advice given to you by your health care provider. Make sure you discuss any questions you have with your health care provider. Document Released: 05/14/2001 Document Revised: 10/26/2015 Document Reviewed: 07/21/2012 Elsevier Interactive Patient Education  2017 [employer].

## 2017-11-13 NOTE — Progress Notes (Signed)
   LOW-RISK PREGNANCY VISIT Patient name: Charlotte Mccormick MRN [id number]  Date of birth: Mar 30, 1986 Chief Complaint:   Routine Prenatal Visit  History of Present Illness:   Charlotte Mccormick is a 32 y.o. G80P2002 female at [redacted]w[redacted]d with an Estimated Date of Delivery: 03/21/18 being seen today for ongoing management of a low-risk pregnancy.  Today she reports vomiting improving, maybe only twice/wk now. Still seeing counselor at [facility] weekly, trying to get into [facility]-counselor is supposed to update her on this next week. Denies current thoughts of harming herself, has recently had some thoughts- discussed w/ counselor who helped her. Has support from husband and mother-in-law and her pastor. Does not feel she needs to adjust her meds right now. Feels tired, wants her electrolytes checked. Contractions: Not present. Vag. Bleeding: None.  Movement: Present. denies leaking of fluid. Review of Systems:   Pertinent items are noted in HPI Denies abnormal vaginal discharge w/ itching/odor/irritation, headaches, visual changes, shortness of breath, chest pain, abdominal pain, severe nausea/vomiting, or problems with urination or bowel movements unless otherwise stated above. Pertinent History Reviewed:  Reviewed past medical,surgical, social, obstetrical and family history.  Reviewed problem list, medications and allergies. Physical Assessment:   Vitals:   11/13/17 1025  BP: (!) 97/58  Pulse: 90  Weight: 168 lb 12.8 oz (76.6 kg)  Body mass index is 30.87 kg/m.        Physical Examination:   General appearance: Well appearing, and in no distress  Mental status: Alert, oriented to person, place, and time  Skin: Warm & dry  Cardiovascular: Normal heart rate noted  Respiratory: Normal respiratory effort, no distress  Abdomen: Soft, gravid, nontender  Pelvic: Cervical exam deferred         Extremities: Edema: None  Fetal Status: Fetal Heart Rate (bpm): 135 Fundal Height: 21 cm  Movement: Present    Results for orders placed or performed in visit on 11/13/17 (from the past 24 hour(s))  POCT urinalysis dipstick   Collection Time: 11/13/17 10:29 AM  Result Value Ref Range   Color, UA     Clarity, UA     Glucose, UA Negative Negative   Bilirubin, UA     Ketones, UA neg    Spec Grav, UA  1.010 - 1.025   Blood, UA neg    pH, UA  5.0 - 8.0   Protein, UA Negative Negative   Urobilinogen, UA  0.2 or 1.0 E.U./dL   Nitrite, UA neg    Leukocytes, UA Negative Negative   Appearance     Odor      Assessment & Plan:  1) Low-risk pregnancy G3P2002 at [redacted]w[redacted]d with an Estimated Date of Delivery: 03/21/18   2) Dep/anx, continue wellbutrin, zoloft, and weekly visits to [facility] for counseling, trying to get into [facility]  3) Fatigue> wants electrolytes checked   Meds: No orders of the defined types were placed in this encounter.  Labs/procedures today: cbc, cmp  Plan:  Continue routine obstetrical care   Reviewed: Preterm labor symptoms and general obstetric precautions including but not limited to vaginal bleeding, contractions, leaking of fluid and fetal movement were reviewed in detail with the patient.  All questions were answered  Follow-up: Return in about 1 month (around 12/11/2017) for [place].  Orders Placed This Encounter  Procedures  . CBC  . Comprehensive metabolic panel  . POCT urinalysis dipstick   [name] CNM, [facility] 11/13/2017 12:35 PM

## 2017-11-14 LAB — CBC
Hematocrit: 33.6 % — ABNORMAL LOW (ref 34.0–46.6)
Hemoglobin: 10.4 g/dL — ABNORMAL LOW (ref 11.1–15.9)
MCH: 22 pg — ABNORMAL LOW (ref 26.6–33.0)
MCHC: 31 g/dL — ABNORMAL LOW (ref 31.5–35.7)
MCV: 71 fL — AB (ref 79–97)
PLATELETS: 234 10*3/uL (ref 150–450)
RBC: 4.73 x10E6/uL (ref 3.77–5.28)
RDW: 16.6 % — AB (ref 12.3–15.4)
WBC: 4.7 10*3/uL (ref 3.4–10.8)

## 2017-11-14 LAB — COMPREHENSIVE METABOLIC PANEL
A/G RATIO: 1.3 (ref 1.2–2.2)
ALT: 9 IU/L (ref 0–32)
AST: 10 IU/L (ref 0–40)
Albumin: 3.8 g/dL (ref 3.5–5.5)
Alkaline Phosphatase: 68 IU/L (ref 39–117)
BUN/Creatinine Ratio: 8 — ABNORMAL LOW (ref 9–23)
BUN: 4 mg/dL — ABNORMAL LOW (ref 6–20)
Bilirubin Total: 0.3 mg/dL (ref 0.0–1.2)
CALCIUM: 9.1 mg/dL (ref 8.7–10.2)
CHLORIDE: 105 mmol/L (ref 96–106)
CO2: 19 mmol/L — ABNORMAL LOW (ref 20–29)
Creatinine, Ser: 0.52 mg/dL — ABNORMAL LOW (ref 0.57–1.00)
GFR calc Af Amer: 147 mL/min/{1.73_m2} (ref >59)
GFR, EST NON AFRICAN AMERICAN: 128 mL/min/{1.73_m2} (ref >59)
Globulin, Total: 3 g/dL (ref 1.5–4.5)
Glucose: 71 mg/dL (ref 65–99)
POTASSIUM: 4.1 mmol/L (ref 3.5–5.2)
Sodium: 136 mmol/L (ref 134–144)
TOTAL PROTEIN: 6.8 g/dL (ref 6.0–8.5)

## 2017-11-20 ENCOUNTER — Encounter: Payer: Self-pay | Admitting: Women's Health

## 2017-12-11 ENCOUNTER — Encounter: Payer: Self-pay | Admitting: Advanced Practice Midwife

## 2017-12-18 ENCOUNTER — Encounter: Payer: Self-pay | Admitting: Advanced Practice Midwife

## 2017-12-22 DIAGNOSIS — Z029 Encounter for administrative examinations, unspecified: Secondary | ICD-10-CM

## 2017-12-25 ENCOUNTER — Encounter: Payer: Self-pay | Admitting: Advanced Practice Midwife

## 2017-12-25 ENCOUNTER — Ambulatory Visit (INDEPENDENT_AMBULATORY_CARE_PROVIDER_SITE_OTHER): Payer: Medicaid Other | Admitting: Advanced Practice Midwife

## 2017-12-25 VITALS — BP 110/78 | HR 101 | Wt 169.0 lb

## 2017-12-25 DIAGNOSIS — Z1389 Encounter for screening for other disorder: Secondary | ICD-10-CM

## 2017-12-25 DIAGNOSIS — Z331 Pregnant state, incidental: Secondary | ICD-10-CM | POA: Diagnosis not present

## 2017-12-25 DIAGNOSIS — F418 Other specified anxiety disorders: Secondary | ICD-10-CM

## 2017-12-25 DIAGNOSIS — Z3482 Encounter for supervision of other normal pregnancy, second trimester: Secondary | ICD-10-CM

## 2017-12-25 LAB — POCT URINALYSIS DIPSTICK OB
Blood, UA: NEGATIVE
Glucose, UA: NEGATIVE — AB
Ketones, UA: NEGATIVE
Leukocytes, UA: NEGATIVE
Nitrite, UA: NEGATIVE
PROTEIN: NEGATIVE

## 2017-12-25 MED ORDER — BUPROPION HCL ER (SR) 200 MG PO TB12: 200.0000 mg | ORAL_TABLET | Freq: Two times a day (BID) | ORAL | 6 refills | Status: DC

## 2017-12-25 NOTE — Patient Instructions (Addendum)
Charlotte Mccormick, I greatly value your feedback.  If you receive a survey following your visit with [place] today, we appreciate you taking the time to fill it out.  Thanks, Nigel Berthold, CNM   Call the office [phone]) or go to [facility] if:  You begin to have strong, frequent contractions  Your water breaks.  Sometimes it is a big gush of fluid, sometimes it is just a trickle that keeps getting your panties wet or running down your legs  You have vaginal bleeding.  It is normal to have a small amount of spotting if your cervix was checked.   You don't feel your baby moving like normal.  If you don't, get you something to eat and drink and lay down and focus on feeling your baby move.  You should feel at least 10 movements in 2 hours.  If you don't, you should call the office or go to [facility].    Tdap Vaccine  It is recommended that you get the Tdap vaccine during the third trimester of EACH pregnancy to help protect your baby from getting pertussis (whooping cough)  27-36 weeks is the BEST time to do this so that you can pass the protection on to your baby. During pregnancy is better than after pregnancy, but if you are unable to get it during pregnancy it will be offered at the hospital.   You will be offered this vaccine in the office after 27 weeks. If you do not have health insurance, you can get this vaccine at the health department or your family doctor  Everyone who will be around your baby should also be up-to-date on their vaccines. Adults (who are not pregnant) only need 1 dose of Tdap during adulthood.   Third Trimester of Pregnancy The third trimester is from week 29 through week 42, months 7 through 9. The third trimester is a time when the fetus is growing rapidly. At the end of the ninth month, the fetus is about 20 inches in length and weighs 6-10 pounds.  BODY CHANGES Your body goes through many changes during pregnancy. The changes vary from  woman to woman.   Your weight will continue to increase. You can expect to gain 25-35 pounds (11-16 kg) by the end of the pregnancy.  You may begin to get stretch marks on your hips, abdomen, and breasts.  You may urinate more often because the fetus is moving lower into your pelvis and pressing on your bladder.  You may develop or continue to have heartburn as a result of your pregnancy.  You may develop constipation because certain hormones are causing the muscles that push waste through your intestines to slow down.  You may develop hemorrhoids or swollen, bulging veins (varicose veins).  You may have pelvic pain because of the weight gain and pregnancy hormones relaxing your joints between the bones in your pelvis. Backaches may result from overexertion of the muscles supporting your posture.  You may have changes in your hair. These can include thickening of your hair, rapid growth, and changes in texture. Some women also have hair loss during or after pregnancy, or hair that feels dry or thin. Your hair will most likely return to normal after your baby is born.  Your breasts will continue to grow and be tender. A yellow discharge may leak from your breasts called colostrum.  Your belly button may stick out.  You may feel short of breath because of your expanding uterus.  You may notice the fetus "dropping," or moving lower in your abdomen.  You may have a bloody mucus discharge. This usually occurs a few days to a week before labor begins.  Your cervix becomes thin and soft (effaced) near your due date. WHAT TO EXPECT AT YOUR PRENATAL EXAMS  You will have prenatal exams every 2 weeks until week 36. Then, you will have weekly prenatal exams. During a routine prenatal visit:  You will be weighed to make sure you and the fetus are growing normally.  Your blood pressure is taken.  Your abdomen will be measured to track your baby's growth.  The fetal heartbeat will be listened  to.  Any test results from the previous visit will be discussed.  You may have a cervical check near your due date to see if you have effaced. At around 36 weeks, your caregiver will check your cervix. At the same time, your caregiver will also perform a test on the secretions of the vaginal tissue. This test is to determine if a type of bacteria, Group B streptococcus, is present. Your caregiver will explain this further. Your caregiver may ask you:  What your birth plan is.  How you are feeling.  If you are feeling the baby move.  If you have had any abnormal symptoms, such as leaking fluid, bleeding, severe headaches, or abdominal cramping.  If you have any questions. Other tests or screenings that may be performed during your third trimester include:  Blood tests that check for low iron levels (anemia).  Fetal testing to check the health, activity level, and growth of the fetus. Testing is done if you have certain medical conditions or if there are problems during the pregnancy. FALSE LABOR You may feel small, irregular contractions that eventually go away. These are called Braxton Hicks contractions, or false labor. Contractions may last for hours, days, or even weeks before true labor sets in. If contractions come at regular intervals, intensify, or become painful, it is best to be seen by your caregiver.  SIGNS OF LABOR   Menstrual-like cramps.  Contractions that are 5 minutes apart or less.  Contractions that start on the top of the uterus and spread down to the lower abdomen and back.  A sense of increased pelvic pressure or back pain.  A watery or bloody mucus discharge that comes from the vagina. If you have any of these signs before the 37th week of pregnancy, call your caregiver right away. You need to go to the hospital to get checked immediately. HOME CARE INSTRUCTIONS   Avoid all smoking, herbs, alcohol, and unprescribed drugs. These chemicals affect the  formation and growth of the baby.  Follow your caregiver's instructions regarding medicine use. There are medicines that are either safe or unsafe to take during pregnancy.  Exercise only as directed by your caregiver. Experiencing uterine cramps is a good sign to stop exercising.  Continue to eat regular, healthy meals.  Wear a good support bra for breast tenderness.  Do not use hot tubs, steam rooms, or saunas.  Wear your seat belt at all times when driving.  Avoid raw meat, uncooked cheese, cat litter boxes, and soil used by cats. These carry germs that can cause birth defects in the baby.  Take your prenatal vitamins.  Try taking a stool softener (if your caregiver approves) if you develop constipation. Eat more high-fiber foods, such as fresh vegetables or fruit and whole grains. Drink plenty of fluids to keep your urine  clear or pale yellow.  Take warm sitz baths to soothe any pain or discomfort caused by hemorrhoids. Use hemorrhoid cream if your caregiver approves.  If you develop varicose veins, wear support hose. Elevate your feet for 15 minutes, 3-4 times a day. Limit salt in your diet.  Avoid heavy lifting, wear low heal shoes, and practice good posture.  Rest a lot with your legs elevated if you have leg cramps or low back pain.  Visit your dentist if you have not gone during your pregnancy. Use a soft toothbrush to brush your teeth and be gentle when you floss.  A sexual relationship may be continued unless your caregiver directs you otherwise.  Do not travel far distances unless it is absolutely necessary and only with the approval of your caregiver.  Take prenatal classes to understand, practice, and ask questions about the labor and delivery.  Make a trial run to the hospital.  Pack your hospital bag.  Prepare the baby's nursery.  Continue to go to all your prenatal visits as directed by your caregiver. SEEK MEDICAL CARE IF:  You are unsure if you are in  labor or if your water has broken.  You have dizziness.  You have mild pelvic cramps, pelvic pressure, or nagging pain in your abdominal area.  You have persistent nausea, vomiting, or diarrhea.  You have a bad smelling vaginal discharge.  You have pain with urination. SEEK IMMEDIATE MEDICAL CARE IF:   You have a fever.  You are leaking fluid from your vagina.  You have spotting or bleeding from your vagina.  You have severe abdominal cramping or pain.  You have rapid weight loss or gain.  You have shortness of breath with chest pain.  You notice sudden or extreme swelling of your face, hands, ankles, feet, or legs.  You have not felt your baby move in over an hour.  You have severe headaches that do not go away with medicine.  You have vision changes. Document Released: 05/14/2001 Document Revised: 05/25/2013 Document Reviewed: 07/21/2012 [facility] Patient Information 2015 [place], [place]. This information is not intended to replace advice given to you by your health care provider. Make sure you discuss any questions you have with your health care provider.  1. Before your test, do not eat or drink anything for 8-10 hours prior to your  appointment (a small amount of water is allowed and you may take any medicines you normally take). Be sure to drink lots of water the day before. 2. When you arrive, your blood will be drawn for a 'fasting' blood sugar level.  Then you will be given a sweetened carbonated beverage to drink. You should  complete drinking this beverage within five minutes. After finishing the  beverage, you will have your blood drawn exactly 1 and 2 hours later. Having  your blood drawn on time is an important part of this test. A total of three blood  samples will be done. 3. The test takes approximately 2  hours. During the test, do not have anything to  eat or drink. Do not smoke, chew gum (not even sugarless gum) or use breath mints.  4. During the test  you should remain close by and seated as much as possible and  avoid walking around. You may want to bring a book or something else to  occupy your time.  5. After your test, you may eat and drink as normal. You may want to bring a snack  to eat  after the test is finished. Your provider will advise you as to the results of  this test and any follow-up if necessary  If your sugar test is positive for gestational diabetes, you will be given an phone call and further instructions discussed. If you wish to know all of your test results before your next appointment, feel free to call the office, or look up your test results on Mychart.  (The range that the lab uses for normal values of the sugar test are not necessarily the range that is used for pregnant women; if your results are within the normal range, they are definitely normal.  However, if a value is deemed "high" by the lab, it may not be too high for a pregnant woman.  We will need to discuss the results if your value(s) fall in the "high" category).     Tdap Vaccine  It is recommended that you get the Tdap vaccine during the third trimester of EACH pregnancy to help protect your baby from getting pertussis (whooping cough)  27-36 weeks is the BEST time to do this so that you can pass the protection on to your baby. During pregnancy is better than after pregnancy, but if you are unable to get it during pregnancy it will be offered at the hospital.  You will be offered this vaccine in the office after 27 weeks.  If you do not have health insurance, you can get the vaccine from the [facility] (no appointment needed).   Everyone who will be around your baby should also be up-to-date on their vaccines. Adults (who are not pregnant) only need 1 dose of Tdap during adulthood.

## 2017-12-25 NOTE — Progress Notes (Signed)
LOW-RISK PREGNANCY VISIT Patient name: Charlotte Mccormick MRN [id number]  Date of birth: [birth year] Chief Complaint:   Routine Prenatal Visit  History of Present Illness:   Charlotte Mccormick is a 32 y.o. G72P2002 female at [redacted]w[redacted]d with an Estimated Date of Delivery: 03/21/18 being seen today for ongoing management of a low-risk pregnancy.  Today she reports feeling depressed over the past few weeks, wants to increase wellbutirn   Sees therapist already.  . Contractions: Not present. Vag. Bleeding: None.  Movement: Present. denies leaking of fluid. Review of Systems:   Pertinent items are noted in HPI Denies abnormal vaginal discharge w/ itching/odor/irritation, headaches, visual changes, shortness of breath, chest pain, abdominal pain, severe nausea/vomiting, or problems with urination or bowel movements unless otherwise stated above. Finished yeast meds last week, wants to make sure it's gone Pertinent History Reviewed:  Medical & Surgical Hx:   Past Medical History:  Diagnosis Date  . Abnormal uterine bleeding (AUB) 12/12/2015  . Anemia   . Anxiety   . Asthma   . Constipation   . Depression   . Fibroid   . Genital herpes   . GERD (gastroesophageal reflux disease)   . History of chlamydia   . Hyperemesis   . IBS (irritable bowel syndrome)   . Insomnia   . PTSD (post-traumatic stress disorder)   . Sinusitis   . Vaginal dryness 12/12/2015   Past Surgical History:  Procedure Laterality Date  . COLONOSCOPY N/A 04/08/2016   Procedure: COLONOSCOPY;  Surgeon: Danie Binder, MD;  Location: [facility];  Service: Endoscopy;  Laterality: N/A;  10:15 AM  . ESOPHAGOGASTRODUODENOSCOPY N/A 04/08/2016   Procedure: ESOPHAGOGASTRODUODENOSCOPY (EGD);  Surgeon: Danie Binder, MD;  Location: [facility];  Service: Endoscopy;  Laterality: N/A;  . PERINEUM REPAIR     Family History  Problem Relation Age of Onset  . Heart disease Mother   . Hypertension Mother   . Hypertension Father    . Hyperlipidemia Father   . Diabetes Father   . Cancer Father   . Epilepsy Brother   . Eczema Daughter   . Colon cancer Neg Hx     Current Outpatient Medications:  .  buPROPion (WELLBUTRIN SR) 100 MG 12 hr tablet, Take 1 tablet (100 mg total) by mouth 2 (two) times daily., Disp: 60 tablet, Rfl: 11 .  Calcium Carbonate Antacid (TUMS PO), Take by mouth 3 (three) times daily., Disp: , Rfl:  .  sertraline (ZOLOFT) 50 MG tablet, Take 1 tablet (50 mg total) by mouth daily., Disp: 30 tablet, Rfl: 11 .  albuterol (PROVENTIL HFA;VENTOLIN HFA) 108 (90 Base) MCG/ACT inhaler, INHALE 2 PUFFS BY MOUTH EVERY 6 HOURS AS NEEDED FOR WHEEZING OR SHORTNESS OF BREATH (Patient not taking: Reported on 11/13/2017), Disp: 8.5 g, Rfl: 3 .  Doxylamine-Pyridoxine (DICLEGIS) 10-10 MG TBEC, Take 2 at hs then 1 in am and 1 in afternoon (Patient not taking: Reported on 09/18/2017), Disp: 100 tablet, Rfl: 3 .  ferrous sulfate 325 (65 FE) MG EC tablet, Take 325 mg by mouth daily with breakfast. , Disp: , Rfl:  .  ondansetron (ZOFRAN) 4 MG tablet, Take 1 tablet (4 mg total) by mouth every 8 (eight) hours as needed for nausea or vomiting. (Patient not taking: Reported on 11/13/2017), Disp: 20 tablet, Rfl: 6 .  pantoprazole (PROTONIX) 20 MG tablet, Take 1 tablet (20 mg total) by mouth daily. (Patient not taking: Reported on 09/18/2017), Disp: 30 tablet, Rfl: 11 .  potassium  chloride SA (K-DUR,KLOR-CON) 20 MEQ tablet, Take 1 tablet (20 mEq total) by mouth 2 (two) times daily. (Patient not taking: Reported on 11/13/2017), Disp: 6 tablet, Rfl: 0 .  Prenat-Fe Carbonyl-FA-Omega 3 (ONE-A-DAY WOMENS PRENATAL 1) 28-0.8-235 MG CAPS, Take 1 capsule by mouth daily. (Patient not taking: Reported on 10/16/2017), Disp: 30 capsule, Rfl:  .  promethazine (PHENERGAN) 25 MG suppository, Place 1 suppository (25 mg total) rectally every 6 (six) hours as needed for nausea or vomiting. (Patient not taking: Reported on 11/13/2017), Disp: 12 each, Rfl: 6 .   promethazine (PHENERGAN) 25 MG tablet, Take 1 tablet (25 mg total) by mouth every 6 (six) hours as needed for nausea or vomiting. (Patient not taking: Reported on 10/16/2017), Disp: 30 tablet, Rfl: 1 .  PROVENTIL HFA 108 (90 Base) MCG/ACT inhaler, INHALE 2 PUFFS INTO THE LUNGS EVERY 6 HOURS AS NEEDED FOR WHEEZING OR SHORTNESS OF BREATH (Patient not taking: Reported on 11/13/2017), Disp: 6.7 g, Rfl: 6 Social History: Reviewed -  reports that she quit smoking about 7 years ago. Her smoking use included cigarettes. She has a 2.50 pack-year smoking history. She has never used smokeless tobacco.  Physical Assessment:   Vitals:   12/25/17 1146  BP: 110/78  Pulse: (!) 101  Weight: 169 lb (76.7 kg)  Body mass index is 30.91 kg/m.        Physical Examination:   General appearance: Well appearing, and in no distress  Mental status: Alert, oriented to person, place, and time  Skin: Warm & dry  Cardiovascular: Normal heart rate noted  Respiratory: Normal respiratory effort, no distress  Abdomen: Soft, gravid, nontender  Pelvic: Cervical exam deferred  SSE:  normal appearing discharge.          Extremities: Edema: None  Fetal Status:     Movement: Present    Results for orders placed or performed in visit on 12/25/17 (from the past 24 hour(s))  POC Urinalysis Dipstick OB   Collection Time: 12/25/17 11:51 AM  Result Value Ref Range   Color, UA     Clarity, UA     Glucose, UA Negative (A) (none)   Bilirubin, UA     Ketones, UA neg    Spec Grav, UA  1.010 - 1.025   Blood, UA neg    pH, UA  5.0 - 8.0   POC Protein UA Negative Negative, Trace   Urobilinogen, UA  0.2 or 1.0 E.U./dL   Nitrite, UA neg    Leukocytes, UA Negative Negative   Appearance     Odor      Assessment & Plan:  1) Low-risk pregnancy G3P2002 at [redacted]w[redacted]d with an Estimated Date of Delivery: 03/21/18   2) increase wellbutrin to , 200mg  BID   Labs/procedures/US today:   Plan:  Continue routine obstetrical care     Follow-up: Return for asap for PN2 only; 3 weeks for LROB.  Orders Placed This Encounter  Procedures  . POC Urinalysis Dipstick OB   Christin Fudge CNM 12/25/2017 12:09 PM

## 2017-12-29 ENCOUNTER — Other Ambulatory Visit: Payer: Medicaid Other

## 2017-12-29 DIAGNOSIS — Z3A28 28 weeks gestation of pregnancy: Secondary | ICD-10-CM

## 2017-12-29 DIAGNOSIS — Z3483 Encounter for supervision of other normal pregnancy, third trimester: Secondary | ICD-10-CM

## 2017-12-29 DIAGNOSIS — Z131 Encounter for screening for diabetes mellitus: Secondary | ICD-10-CM

## 2017-12-30 ENCOUNTER — Encounter (INDEPENDENT_AMBULATORY_CARE_PROVIDER_SITE_OTHER): Payer: Self-pay

## 2017-12-30 LAB — GLUCOSE TOLERANCE, 2 HOURS W/ 1HR
GLUCOSE, 1 HOUR: 121 mg/dL (ref 65–179)
Glucose, 2 hour: 89 mg/dL (ref 65–152)
Glucose, Fasting: 73 mg/dL (ref 65–91)

## 2017-12-30 LAB — CBC
HEMATOCRIT: 32.7 % — AB (ref 34.0–46.6)
Hemoglobin: 10.1 g/dL — ABNORMAL LOW (ref 11.1–15.9)
MCH: 22 pg — ABNORMAL LOW (ref 26.6–33.0)
MCHC: 30.9 g/dL — AB (ref 31.5–35.7)
MCV: 71 fL — AB (ref 79–97)
Platelets: 176 10*3/uL (ref 150–450)
RBC: 4.6 x10E6/uL (ref 3.77–5.28)
RDW: 16.6 % — ABNORMAL HIGH (ref 12.3–15.4)
WBC: 4.4 10*3/uL (ref 3.4–10.8)

## 2017-12-30 LAB — ANTIBODY SCREEN: ANTIBODY SCREEN: NEGATIVE

## 2017-12-30 LAB — HIV ANTIBODY (ROUTINE TESTING W REFLEX): HIV Screen 4th Generation wRfx: NONREACTIVE

## 2017-12-30 LAB — RPR: RPR Ser Ql: NONREACTIVE

## 2018-01-02 ENCOUNTER — Encounter: Payer: Self-pay | Admitting: Advanced Practice Midwife

## 2018-01-15 ENCOUNTER — Ambulatory Visit (INDEPENDENT_AMBULATORY_CARE_PROVIDER_SITE_OTHER): Payer: Medicaid Other | Admitting: Obstetrics & Gynecology

## 2018-01-15 ENCOUNTER — Encounter: Payer: Self-pay | Admitting: Obstetrics & Gynecology

## 2018-01-15 VITALS — BP 113/72 | HR 96 | Wt 170.0 lb

## 2018-01-15 DIAGNOSIS — Z1389 Encounter for screening for other disorder: Secondary | ICD-10-CM | POA: Diagnosis not present

## 2018-01-15 DIAGNOSIS — Z3483 Encounter for supervision of other normal pregnancy, third trimester: Secondary | ICD-10-CM

## 2018-01-15 DIAGNOSIS — Z3A30 30 weeks gestation of pregnancy: Secondary | ICD-10-CM | POA: Diagnosis not present

## 2018-01-15 DIAGNOSIS — Z331 Pregnant state, incidental: Secondary | ICD-10-CM | POA: Diagnosis not present

## 2018-01-15 LAB — POCT URINALYSIS DIPSTICK OB
Blood, UA: NEGATIVE
GLUCOSE, UA: NEGATIVE — AB
Nitrite, UA: NEGATIVE

## 2018-01-15 NOTE — Progress Notes (Signed)
      LOW-RISK PREGNANCY VISIT Patient name: Charlotte Mccormick MRN [id number]  Date of birth: 1985-11-16 Chief Complaint:   Routine Prenatal Visit (low back pain; + contractions; trouble sleeping)  History of Present Illness:   Charlotte Mccormick is a 32 y.o. G37P2002 female at [redacted]w[redacted]d with an Estimated Date of Delivery: 03/21/18 being seen today for ongoing management of a low-risk pregnancy.  Today she reports no complaints. Contractions: Irregular. Vag. Bleeding: None.  Movement: Present. denies leaking of fluid. Review of Systems:   Pertinent items are noted in HPI Denies abnormal vaginal discharge w/ itching/odor/irritation, headaches, visual changes, shortness of breath, chest pain, abdominal pain, severe nausea/vomiting, or problems with urination or bowel movements unless otherwise stated above. Pertinent History Reviewed:  Reviewed past medical,surgical, social, obstetrical and family history.  Reviewed problem list, medications and allergies. Physical Assessment:   Vitals:   01/15/18 1124  BP: 113/72  Pulse: 96  Weight: 170 lb (77.1 kg)  Body mass index is 31.09 kg/m.        Physical Examination:   General appearance: Well appearing, and in no distress  Mental status: Alert, oriented to person, place, and time  Skin: Warm & dry  Cardiovascular: Normal heart rate noted  Respiratory: Normal respiratory effort, no distress  Abdomen: Soft, gravid, nontender  Pelvic: Cervical exam deferred         Extremities: Edema: None  Fetal Status: Fetal Heart Rate (bpm): 138 Fundal Height: 31 cm Movement: Present    Results for orders placed or performed in visit on 01/15/18 (from the past 24 hour(s))  POC Urinalysis Dipstick OB   Collection Time: 01/15/18 11:24 AM  Result Value Ref Range   Color, UA     Clarity, UA     Glucose, UA Negative (A) (none)   Bilirubin, UA     Ketones, UA trace    Spec Grav, UA     Blood, UA neg    pH, UA     POC Protein UA Trace Negative,  Trace   Urobilinogen, UA     Nitrite, UA neg    Leukocytes, UA Large (3+) (A) Negative   Appearance     Odor      Assessment & Plan:  1) Low-risk pregnancy G3P2002 at [redacted]w[redacted]d with an Estimated Date of Delivery: 03/21/18   2) Depression/anxiety, stable   Meds: No orders of the defined types were placed in this encounter.  Labs/procedures today:   Plan:  Continue routine obstetrical care   Reviewed: Preterm labor symptoms and general obstetric precautions including but not limited to vaginal bleeding, contractions, leaking of fluid and fetal movement were reviewed in detail with the patient.  All questions were answered  Follow-up: Return in about 2 weeks (around 01/29/2018) for [place].  Orders Placed This Encounter  Procedures  . POC Urinalysis Dipstick OB   Mertie Clause Eure  01/15/2018 11:41 AM

## 2018-01-20 ENCOUNTER — Telehealth: Payer: Self-pay | Admitting: *Deleted

## 2018-01-20 ENCOUNTER — Inpatient Hospital Stay (HOSPITAL_COMMUNITY)
Admission: AD | Admit: 2018-01-20 | Discharge: 2018-01-20 | Disposition: A | Discharge status: Home / Self Care | Payer: Medicaid Other | Source: Ambulatory Visit | Attending: Obstetrics and Gynecology | Admitting: Obstetrics and Gynecology

## 2018-01-20 ENCOUNTER — Encounter (HOSPITAL_COMMUNITY): Payer: Self-pay

## 2018-01-20 DIAGNOSIS — Z3A31 31 weeks gestation of pregnancy: Secondary | ICD-10-CM | POA: Insufficient documentation

## 2018-01-20 DIAGNOSIS — O368130 Decreased fetal movements, third trimester, not applicable or unspecified: Secondary | ICD-10-CM | POA: Diagnosis present

## 2018-01-20 DIAGNOSIS — Z880 Allergy status to penicillin: Secondary | ICD-10-CM | POA: Insufficient documentation

## 2018-01-20 DIAGNOSIS — Z3483 Encounter for supervision of other normal pregnancy, third trimester: Secondary | ICD-10-CM

## 2018-01-20 DIAGNOSIS — O4703 False labor before 37 completed weeks of gestation, third trimester: Secondary | ICD-10-CM | POA: Diagnosis not present

## 2018-01-20 DIAGNOSIS — Z87891 Personal history of nicotine dependence: Secondary | ICD-10-CM | POA: Insufficient documentation

## 2018-01-20 DIAGNOSIS — Z0371 Encounter for suspected problem with amniotic cavity and membrane ruled out: Secondary | ICD-10-CM

## 2018-01-20 LAB — URINALYSIS, ROUTINE W REFLEX MICROSCOPIC
Bilirubin Urine: NEGATIVE
Glucose, UA: NEGATIVE mg/dL
Hgb urine dipstick: NEGATIVE
Ketones, ur: NEGATIVE mg/dL
Leukocytes, UA: NEGATIVE
Nitrite: NEGATIVE
Protein, ur: 30 mg/dL — AB
Specific Gravity, Urine: 1.02 (ref 1.005–1.030)
pH: 7 (ref 5.0–8.0)

## 2018-01-20 LAB — POCT FERN TEST: POCT Fern Test: NEGATIVE

## 2018-01-20 LAB — WET PREP, GENITAL
Clue Cells Wet Prep HPF POC: NONE SEEN
Sperm: NONE SEEN
Trich, Wet Prep: NONE SEEN
Yeast Wet Prep HPF POC: NONE SEEN

## 2018-01-20 MED ORDER — ACETAMINOPHEN 500 MG PO TABS
1000.0000 mg | ORAL_TABLET | Freq: Once | ORAL | Status: AC
  Administered 2018-01-20: 1000 mg via ORAL
  Filled 2018-01-20: qty 2

## 2018-01-20 MED ORDER — ONDANSETRON 8 MG PO TBDP
8.0000 mg | ORAL_TABLET | Freq: Once | ORAL | Status: AC
  Administered 2018-01-20: 8 mg via ORAL
  Filled 2018-01-20: qty 1

## 2018-01-20 MED ORDER — NIFEDIPINE 10 MG PO CAPS
10.0000 mg | ORAL_CAPSULE | ORAL | Status: DC | PRN
  Administered 2018-01-20 (×3): 10 mg via ORAL
  Filled 2018-01-20 (×3): qty 1

## 2018-01-20 NOTE — MAU Provider Note (Addendum)
Chief Complaint:  Emesis; Decreased Fetal Movement; and Contractions   First Provider Initiated Contact with Patient 01/20/18 [phone]      HPI: Charlotte Mccormick is a 32 y.o. G3P2002 at 39w3dwho presents to maternity admissions reporting decreased fetal movement, nausea/vomiting, contractions and possible rupture of membranes.  She reports decreased fetal movement occurred last night states she only felt 3-4 movements throughout the night.  She reports feeling movement this morning prior to arrival to MAU and is currently feeling movement while in MAU.  She reports contractions that have been occurring for the past 2 to 3 weeks, reports spoke to the on-call nurse when contractions first occurred 3 weeks ago and was told to watch contractions and increase amount of fluid.  Patient reports she has not had her cervix checked throughout this pregnancy.  She reports contractions that occur every 10 to 15 minutes over the past 2 days, rates pain 5 out of 10-has not taking any medication for pain.  She reports nausea and vomiting that has been a issue throughout pregnancy but got worse last night, she reports vomiting 6 times over the past 24 hours, has not taking any medication for nausea and vomiting-reports she does not have any medication at home.  She reports possible rupture of membrane around 0530, reports having a trickle down leg when she coughed and is unsure whether it was amniotic fluid or urine.  She denies leaking since the trickle, having to wear a pad, or change underwear. She denies vaginal bleeding, vaginal itching/burning, urinary symptoms, h/a, dizziness,or fever/chills. She receives prenatal care at [facility] and next appointment is scheduled for August 29th.   Past Medical History: Past Medical History:  Diagnosis Date  . Abnormal uterine bleeding (AUB) 12/12/2015  . Anemia   . Anxiety   . Asthma   . Constipation   . Depression   . Fibroid   . Genital herpes   . GERD (gastroesophageal  reflux disease)   . History of chlamydia   . Hyperemesis   . IBS (irritable bowel syndrome)   . Insomnia   . PTSD (post-traumatic stress disorder)   . Sinusitis   . Vaginal dryness 12/12/2015    Past obstetric history: OB History  Gravida Para Term Preterm AB Living  3 2 2     2   SAB TAB Ectopic Multiple Live Births        0 2    # Outcome Date GA Lbr Len/2nd Weight Sex Delivery Anes PTL Lv  3 Current           2 Term 12/15/16 [redacted]w[redacted]d 14:41 / 00:07 3544 g M Vag-Spont EPI  LIV  1 Term 04/02/12 [redacted]w[redacted]d  3232 g F Vag-Spont EPI N LIV    Past Surgical History: Past Surgical History:  Procedure Laterality Date  . COLONOSCOPY N/A 04/08/2016   Procedure: COLONOSCOPY;  Surgeon: Danie Binder, MD;  Location: [facility];  Service: Endoscopy;  Laterality: N/A;  10:15 AM  . ESOPHAGOGASTRODUODENOSCOPY N/A 04/08/2016   Procedure: ESOPHAGOGASTRODUODENOSCOPY (EGD);  Surgeon: Danie Binder, MD;  Location: [facility];  Service: Endoscopy;  Laterality: N/A;  . PERINEUM REPAIR      Family History: Family History  Problem Relation Age of Onset  . Heart disease Mother   . Hypertension Mother   . Hypertension Father   . Hyperlipidemia Father   . Diabetes Father   . Cancer Father   . Epilepsy Brother   . Eczema Daughter   . Colon  cancer Neg Hx     Social History: Social History   Tobacco Use  . Smoking status: Former Smoker    Packs/day: 0.50    Years: 5.00    Pack years: 2.50    Types: Cigarettes    Last attempt to quit: 06/03/2010    Years since quitting: 7.6  . Smokeless tobacco: Never Used  Substance Use Topics  . Alcohol use: Yes    Frequency: Never    Comment: occ wine  . Drug use: No    Allergies:  Allergies  Allergen Reactions  . Bee Venom Swelling and Other (See Comments)    Reaction:  Localized swelling   . Penicillins Itching and Other (See Comments)    Has patient had a PCN reaction causing immediate rash, facial/tongue/throat swelling, SOB or lightheadedness  with hypotension: No Has patient had a PCN reaction causing severe rash involving mucus membranes or skin necrosis: No Has patient had a PCN reaction that required hospitalization No Has patient had a PCN reaction occurring within the last 10 years: Yes If all of the above answers are "NO", then may proceed with Cephalosporin use.   . Reglan [Metoclopramide] Anxiety    Meds:  Medications Prior to Admission  Medication Sig Dispense Refill Last Dose  . albuterol (PROVENTIL HFA;VENTOLIN HFA) 108 (90 Base) MCG/ACT inhaler INHALE 2 PUFFS BY MOUTH EVERY 6 HOURS AS NEEDED FOR WHEEZING OR SHORTNESS OF BREATH 8.5 g 3 Taking  . buPROPion (WELLBUTRIN SR) 200 MG 12 hr tablet Take 1 tablet (200 mg total) by mouth 2 (two) times daily. 60 tablet 6 Taking  . Calcium Carbonate Antacid (TUMS PO) Take by mouth 6 (six) times daily.    Taking  . ferrous sulfate 325 (65 FE) MG EC tablet Take 325 mg by mouth as needed.    Taking  . sertraline (ZOLOFT) 50 MG tablet Take 1 tablet (50 mg total) by mouth daily. 30 tablet 11 Taking    ROS:  Review of Systems  Constitutional: Negative.   Respiratory: Negative.   Cardiovascular: Negative.   Gastrointestinal: Positive for abdominal pain, nausea and vomiting. Negative for constipation and diarrhea.  Genitourinary: Positive for vaginal discharge. Negative for difficulty urinating, dysuria, frequency, pelvic pain, urgency, vaginal bleeding and vaginal pain.       Leaking of fluid  Neurological: Negative.    I have reviewed patient's Past Medical Hx, Surgical Hx, Family Hx, Social Hx, medications and allergies.   Physical Exam   Patient Vitals for the past 24 hrs:  BP Temp Temp src Pulse Resp Weight  01/20/18 0723 120/70 98.2 F (36.8 C) Oral (!) 102 18 -  01/20/18 0708 - - - - - 76.1 kg   Constitutional: Well-developed, well-nourished female in no acute distress.  Cardiovascular: normal rate Respiratory: normal effort GI: Abd soft, non-tender, gravid  appropriate for gestational age.  MS: Extremities nontender, no edema, normal ROM Neurologic: Alert and oriented x 4.   PELVIC EXAM: Cervix pink, visually closed, without lesion, scant white creamy discharge-normal discharge, vaginal walls and external genitalia normal, NEGATIVE pooling  CERVICAL EXAM: Dilation: Closed Effacement (%): Thick Cervical Position: Posterior Exam by:: rogers cnm  FHT:  Baseline 140 , moderate variability, accelerations present, no decelerations Contractions: q 4-5 mins on arrival to MAU    Labs: No results found for this or any previous visit (from the past 24 hour(s)). B/Positive/-- (03/20 1224)  MAU Course/MDM: Orders Placed This Encounter  Procedures  . Wet prep, genital  . Urinalysis,  Routine w reflex microscopic  . POCT fern test   UA- pending  Fern- negative  Wet prep- pending   Meds ordered this encounter  Medications  . ondansetron (ZOFRAN-ODT) disintegrating tablet 8 mg  . NIFEdipine (PROCARDIA) capsule 10 mg   NST reviewed 0730- Procardia and Zofran ordered    Report given to Kerry Hough @0806   Darrol Poke Certified Nurse-Midwife 01/20/2018 8:08 AM   Care of pt assumed by Manya Silvas, CNM at [phone]. Pt still having back pain, contractions, nausea, but meds recently given. Will give them time to work and do trial of POs and recheck cervix.   0935: Cervix 0/0/ballotable. Back pain resolved and UC's decreased. FHR reactive. Audible fetal mvmt.   Assessment/Plan: 1. Preterm uterine contractions in third trimester, antepartum   2. Encounter for supervision of other normal pregnancy in third trimester   3. Decreased fetal movements in third trimester, single or unspecified fetus   4. No leakage of amniotic fluid into vagina    D/C home in stable condition. PTL precautions and FKCs Follow-up Information    [facility] Follow up.   Why:  as scheduled or sooner as needed if symptoms worsen Contact information: [place] [id number] [place] Follow up.   Why:  as needed in pregnancy emergencies Contact information: [address] [id number] [place] [address] [phone]         Allergies as of 01/20/2018      Reactions   Bee Venom Swelling, Other (See Comments)   Reaction:  Localized swelling    Penicillins Itching, Other (See Comments)   Has patient had a PCN reaction causing immediate rash, facial/tongue/throat swelling, SOB or lightheadedness with hypotension: No Has patient had a PCN reaction causing severe rash involving mucus membranes or skin necrosis: No Has patient had a PCN reaction that required hospitalization No Has patient had a PCN reaction occurring within the last 10 years: Yes If all of the above answers are "NO", then may proceed with Cephalosporin use.   Reglan [metoclopramide] Anxiety      Medication List    TAKE these medications   albuterol 108 (90 Base) MCG/ACT inhaler Commonly known as:  PROVENTIL HFA;VENTOLIN HFA INHALE 2 PUFFS BY MOUTH EVERY 6 HOURS AS NEEDED FOR WHEEZING OR SHORTNESS OF BREATH   buPROPion 200 MG 12 hr tablet Commonly known as:  WELLBUTRIN SR Take 1 tablet (200 mg total) by mouth 2 (two) times daily.   ferrous sulfate 325 (65 FE) MG EC tablet Take 325 mg by mouth as needed.   sertraline 50 MG tablet Commonly known as:  ZOLOFT Take 1 tablet (50 mg total) by mouth daily.   TUMS PO Take by mouth 6 (six) times daily.      Tamala Julian, [place], [place] 01/20/2018 9:46 AM

## 2018-01-20 NOTE — Discharge Instructions (Signed)
Braxton Hicks Contractions °Contractions of the uterus can occur throughout pregnancy, but they are not always a sign that you are in labor. You may have practice contractions called Braxton Hicks contractions. These false labor contractions are sometimes confused with true labor. °What are Braxton Hicks contractions? °Braxton Hicks contractions are tightening movements that occur in the muscles of the uterus before labor. Unlike true labor contractions, these contractions do not result in opening (dilation) and thinning of the cervix. Toward the end of pregnancy (32-34 weeks), Braxton Hicks contractions can happen more often and may become stronger. These contractions are sometimes difficult to tell apart from true labor because they can be very uncomfortable. You should not feel embarrassed if you go to the hospital with false labor. °Sometimes, the only way to tell if you are in true labor is for your health care provider to look for changes in the cervix. The health care provider will do a physical exam and may monitor your contractions. If you are not in true labor, the exam should show that your cervix is not dilating and your water has not broken. °If there are other health problems associated with your pregnancy, it is completely safe for you to be sent home with false labor. You may continue to have Braxton Hicks contractions until you go into true labor. °How to tell the difference between true labor and false labor °True labor °· Contractions last 30-70 seconds. °· Contractions become very regular. °· Discomfort is usually felt in the top of the uterus, and it spreads to the lower abdomen and low back. °· Contractions do not go away with walking. °· Contractions usually become more intense and increase in frequency. °· The cervix dilates and gets thinner. °False labor °· Contractions are usually shorter and not as strong as true labor contractions. °· Contractions are usually irregular. °· Contractions  are often felt in the front of the lower abdomen and in the groin. °· Contractions may go away when you walk around or change positions while lying down. °· Contractions get weaker and are shorter-lasting as time goes on. °· The cervix usually does not dilate or become thin. °Follow these instructions at home: °· Take over-the-counter and prescription medicines only as told by your health care provider. °· Keep up with your usual exercises and follow other instructions from your health care provider. °· Eat and drink lightly if you think you are going into labor. °· If Braxton Hicks contractions are making you uncomfortable: °? Change your position from lying down or resting to walking, or change from walking to resting. °? Sit and rest in a tub of warm water. °? Drink enough fluid to keep your urine pale yellow. Dehydration may cause these contractions. °? Do slow and deep breathing several times an hour. °· Keep all follow-up prenatal visits as told by your health care provider. This is important. °Contact a health care provider if: °· You have a fever. °· You have continuous pain in your abdomen. °Get help right away if: °· Your contractions become stronger, more regular, and closer together. °· You have fluid leaking or gushing from your vagina. °· You pass blood-tinged mucus (bloody show). °· You have bleeding from your vagina. °· You have low back pain that you never had before. °· You feel your baby’s head pushing down and causing pelvic pressure. °· Your baby is not moving inside you as much as it used to. °Summary °· Contractions that occur before labor are called Braxton   Hicks contractions, false labor, or practice contractions. °· Braxton Hicks contractions are usually shorter, weaker, farther apart, and less regular than true labor contractions. True labor contractions usually become progressively stronger and regular and they become more frequent. °· Manage discomfort from Braxton Hicks contractions by  changing position, resting in a warm bath, drinking plenty of water, or practicing deep breathing. °This information is not intended to replace advice given to you by your health care provider. Make sure you discuss any questions you have with your health care provider. °Document Released: 10/03/2016 Document Revised: 10/03/2016 Document Reviewed: 10/03/2016 °Elsevier Interactive Patient Education © 2018 [employer] ° °

## 2018-01-20 NOTE — Telephone Encounter (Signed)
Patient states she still has a headache and was given 1000mg  Tylenol at 9 at [facility] and doesn't know how much/often she can take.  Advised 1000mg  every 6 hours and can add Ibuprofen as well.  Encouraged cool compresses to see if that would help.  Verbalized understanding.

## 2018-01-20 NOTE — MAU Note (Addendum)
Reports only feeling 3-4 movements through the night and last movement was at 635  Ctx have been ongoing but have gotten worse  N/V has been an ongoing issue throughout pregnancy but is worse since last night- has vomitted 6 times  Reports some LOF around 0530, no leaking since, it was clear  No bleeding, no vaginal discharge

## 2018-01-29 ENCOUNTER — Other Ambulatory Visit: Payer: Self-pay

## 2018-01-29 ENCOUNTER — Ambulatory Visit (INDEPENDENT_AMBULATORY_CARE_PROVIDER_SITE_OTHER): Payer: Medicaid Other | Admitting: Obstetrics and Gynecology

## 2018-01-29 ENCOUNTER — Encounter: Payer: Self-pay | Admitting: Obstetrics and Gynecology

## 2018-01-29 VITALS — BP 115/68 | HR 97 | Wt 167.0 lb

## 2018-01-29 DIAGNOSIS — Z3483 Encounter for supervision of other normal pregnancy, third trimester: Secondary | ICD-10-CM

## 2018-01-29 DIAGNOSIS — Z331 Pregnant state, incidental: Secondary | ICD-10-CM

## 2018-01-29 DIAGNOSIS — Z23 Encounter for immunization: Secondary | ICD-10-CM

## 2018-01-29 DIAGNOSIS — Z1389 Encounter for screening for other disorder: Secondary | ICD-10-CM

## 2018-01-29 DIAGNOSIS — O9989 Other specified diseases and conditions complicating pregnancy, childbirth and the puerperium: Secondary | ICD-10-CM | POA: Diagnosis not present

## 2018-01-29 DIAGNOSIS — N898 Other specified noninflammatory disorders of vagina: Secondary | ICD-10-CM

## 2018-01-29 DIAGNOSIS — Z3A32 32 weeks gestation of pregnancy: Secondary | ICD-10-CM

## 2018-01-29 LAB — POCT URINALYSIS DIPSTICK OB
Blood, UA: NEGATIVE
Glucose, UA: NEGATIVE
NITRITE UA: NEGATIVE
PROTEIN: NEGATIVE

## 2018-01-29 LAB — POCT WET PREP (WET MOUNT): Trichomonas Wet Prep HPF POC: ABSENT

## 2018-01-29 MED ORDER — METRONIDAZOLE 500 MG PO TABS: 500.0000 mg | ORAL_TABLET | Freq: Two times a day (BID) | ORAL | 1 refills | Status: DC

## 2018-01-29 NOTE — Progress Notes (Signed)
Patient ID: Charlotte Mccormick, female   DOB: 01-Mar-1986, 32 y.o.   MRN: [id number]  LOW-RISK PREGNANCY VISIT Patient name: Charlotte Mccormick MRN [id number]  Date of birth: [birth year] Chief Complaint:   Routine Prenatal Visit  History of Present Illness:   Charlotte Mccormick is a 32 y.o. G53P2002 female at 108w5d with an Estimated Date of Delivery: 03/21/18 being seen today for ongoing management of a low-risk pregnancy.  Today she reports itching and yellow discharge. Possible yeast infection but she has never had one before. Pt also says that the baby is not as active as it used to be and she requests a cervical exam. She went to [facility] for preterm uterine contractions on 01/20/2018.   Contractions: Irregular. Vag. Bleeding: None.  Movement: Present. denies leaking of fluid. Review of Systems:   Pertinent items are noted in HPI Denies abnormal vaginal discharge w/ itching/odor/irritation, headaches, visual changes, shortness of breath, chest pain, abdominal pain, severe nausea/vomiting, or problems with urination or bowel movements unless otherwise stated above. Pertinent History Reviewed:  Reviewed past medical,surgical, social, obstetrical and family history.  Reviewed problem list, medications and allergies. Physical Assessment:   Vitals:   01/29/18 1115  BP: 115/68  Pulse: 97  Weight: 167 lb (75.8 kg)  Body mass index is 30.54 kg/m.        Physical Examination:   General appearance: Well appearing, and in no distress  Mental status: Alert, oriented to person, place, and time  Skin: Warm & dry  Cardiovascular: Normal heart rate noted  Respiratory: Normal respiratory effort, no distress  Abdomen: Soft, gravid, nontender  Pelvic: Cervical exam performed   VAGINA: a lot of discharge at the entrance  CERVIX: closed, short. 25% effacement.  WET PREP: lots of white cells, negative trich, negative yeast, few clue cells          Extremities: Edema: None  Fetal Status: Fetal  Heart Rate (bpm): 134 Fundal Height: 32 cm Movement: Present    Results for orders placed or performed in visit on 01/29/18 (from the past 24 hour(s))  POC Urinalysis Dipstick OB   Collection Time: 01/29/18 11:19 AM  Result Value Ref Range   Color, UA     Clarity, UA     Glucose, UA Negative Negative   Bilirubin, UA     Ketones, UA small    Spec Grav, UA     Blood, UA neg    pH, UA     POC Protein UA Negative Negative, Trace   Urobilinogen, UA     Nitrite, UA neg    Leukocytes, UA Small (1+) (A) Negative   Appearance     Odor      Assessment & Plan:  1) Low-risk pregnancy G3P2002 at [redacted]w[redacted]d with an Estimated Date of Delivery: 03/21/18   2) Bacterial Vaginosis rx metronidazole bid po x 7d   Plan:   1) Continue routine obstetrical care 2) Rx oral Metronidazole 500mg  BID for 7 days  Meds:  Meds ordered this encounter  Medications  . metroNIDAZOLE (FLAGYL) 500 MG tablet    Sig: Take 1 tablet (500 mg total) by mouth 2 (two) times daily.    Dispense:  14 tablet    Refill:  1   Labs/procedures today: none  Follow-up: Return in about 2 weeks (around 02/12/2018) for LROB.  Orders Placed This Encounter  Procedures  . Tdap vaccine greater than or equal to 7yo IM  . POC Urinalysis Dipstick OB   By signing my  name below, I, De Burrs, attest that this documentation has been prepared under the direction and in the presence of [name], MD. Electronically Signed: De Burrs, Medical Scribe. 01/29/18. 11:46 AM.  I personally performed the services described in this documentation, which was SCRIBED in my presence. The recorded information has been reviewed and considered accurate. It has been edited as necessary during review. [name], MD

## 2018-01-30 ENCOUNTER — Emergency Department (HOSPITAL_COMMUNITY)
Admission: EM | Admit: 2018-01-30 | Discharge: 2018-01-30 | Disposition: A | Discharge status: Other Acute Inpatient Hospital | Payer: No Typology Code available for payment source | Attending: Obstetrics & Gynecology | Admitting: Obstetrics & Gynecology

## 2018-01-30 ENCOUNTER — Encounter (HOSPITAL_COMMUNITY): Payer: Self-pay | Admitting: *Deleted

## 2018-01-30 DIAGNOSIS — O479 False labor, unspecified: Secondary | ICD-10-CM | POA: Diagnosis not present

## 2018-01-30 DIAGNOSIS — O4700 False labor before 37 completed weeks of gestation, unspecified trimester: Secondary | ICD-10-CM

## 2018-01-30 DIAGNOSIS — Y9389 Activity, other specified: Secondary | ICD-10-CM | POA: Insufficient documentation

## 2018-01-30 DIAGNOSIS — Y92410 Unspecified street and highway as the place of occurrence of the external cause: Secondary | ICD-10-CM | POA: Diagnosis not present

## 2018-01-30 DIAGNOSIS — Z3A33 33 weeks gestation of pregnancy: Secondary | ICD-10-CM | POA: Diagnosis not present

## 2018-01-30 DIAGNOSIS — O9989 Other specified diseases and conditions complicating pregnancy, childbirth and the puerperium: Secondary | ICD-10-CM | POA: Diagnosis present

## 2018-01-30 DIAGNOSIS — Y999 Unspecified external cause status: Secondary | ICD-10-CM | POA: Diagnosis not present

## 2018-01-30 DIAGNOSIS — M545 Low back pain: Secondary | ICD-10-CM | POA: Diagnosis not present

## 2018-01-30 DIAGNOSIS — J45909 Unspecified asthma, uncomplicated: Secondary | ICD-10-CM | POA: Diagnosis not present

## 2018-01-30 HISTORY — DX: Mental disorder, not otherwise specified: F99

## 2018-01-30 MED ORDER — ACETAMINOPHEN 500 MG PO TABS
1000.0000 mg | ORAL_TABLET | Freq: Once | ORAL | Status: AC
  Administered 2018-01-30: 1000 mg via ORAL
  Filled 2018-01-30: qty 2

## 2018-01-30 MED ORDER — LACTATED RINGERS IV BOLUS
1000.0000 mL | Freq: Once | INTRAVENOUS | Status: AC
  Administered 2018-01-30: 1000 mL via INTRAVENOUS

## 2018-01-30 NOTE — Discharge Instructions (Signed)

## 2018-01-30 NOTE — ED Notes (Signed)
Report given to MAU by OB RN , [facility]
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
## 2018-01-30 NOTE — ED Notes (Signed)
CALLED [facility] FOR PT TRANSPORT TO MAU
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
## 2018-02-01 ENCOUNTER — Inpatient Hospital Stay (HOSPITAL_COMMUNITY)
Admission: AD | Admit: 2018-02-01 | Discharge: 2018-02-01 | Disposition: A | Discharge status: Home / Self Care | Payer: Medicaid Other | Source: Ambulatory Visit | Attending: Obstetrics & Gynecology | Admitting: Obstetrics & Gynecology

## 2018-02-01 ENCOUNTER — Encounter (HOSPITAL_COMMUNITY): Payer: Self-pay | Admitting: *Deleted

## 2018-02-01 DIAGNOSIS — Z3A33 33 weeks gestation of pregnancy: Secondary | ICD-10-CM | POA: Insufficient documentation

## 2018-02-01 DIAGNOSIS — M7918 Myalgia, other site: Secondary | ICD-10-CM | POA: Insufficient documentation

## 2018-02-01 DIAGNOSIS — F4310 Post-traumatic stress disorder, unspecified: Secondary | ICD-10-CM | POA: Diagnosis not present

## 2018-02-01 DIAGNOSIS — Z87891 Personal history of nicotine dependence: Secondary | ICD-10-CM | POA: Diagnosis not present

## 2018-02-01 DIAGNOSIS — O99343 Other mental disorders complicating pregnancy, third trimester: Secondary | ICD-10-CM | POA: Insufficient documentation

## 2018-02-01 DIAGNOSIS — O26893 Other specified pregnancy related conditions, third trimester: Secondary | ICD-10-CM | POA: Insufficient documentation

## 2018-02-01 DIAGNOSIS — G4700 Insomnia, unspecified: Secondary | ICD-10-CM | POA: Insufficient documentation

## 2018-02-01 DIAGNOSIS — F329 Major depressive disorder, single episode, unspecified: Secondary | ICD-10-CM | POA: Insufficient documentation

## 2018-02-01 DIAGNOSIS — O9989 Other specified diseases and conditions complicating pregnancy, childbirth and the puerperium: Secondary | ICD-10-CM

## 2018-02-01 DIAGNOSIS — J45909 Unspecified asthma, uncomplicated: Secondary | ICD-10-CM | POA: Insufficient documentation

## 2018-02-01 DIAGNOSIS — Z79899 Other long term (current) drug therapy: Secondary | ICD-10-CM | POA: Insufficient documentation

## 2018-02-01 DIAGNOSIS — K219 Gastro-esophageal reflux disease without esophagitis: Secondary | ICD-10-CM | POA: Diagnosis not present

## 2018-02-01 DIAGNOSIS — O99613 Diseases of the digestive system complicating pregnancy, third trimester: Secondary | ICD-10-CM | POA: Diagnosis not present

## 2018-02-01 DIAGNOSIS — O99513 Diseases of the respiratory system complicating pregnancy, third trimester: Secondary | ICD-10-CM | POA: Diagnosis not present

## 2018-02-01 DIAGNOSIS — O99353 Diseases of the nervous system complicating pregnancy, third trimester: Secondary | ICD-10-CM | POA: Insufficient documentation

## 2018-02-01 DIAGNOSIS — K589 Irritable bowel syndrome without diarrhea: Secondary | ICD-10-CM | POA: Insufficient documentation

## 2018-02-01 LAB — URINALYSIS, ROUTINE W REFLEX MICROSCOPIC
BILIRUBIN URINE: NEGATIVE
Glucose, UA: NEGATIVE mg/dL
HGB URINE DIPSTICK: NEGATIVE
Ketones, ur: NEGATIVE mg/dL
NITRITE: NEGATIVE
PROTEIN: NEGATIVE mg/dL
SPECIFIC GRAVITY, URINE: 1.02 (ref 1.005–1.030)
pH: 7 (ref 5.0–8.0)

## 2018-02-01 MED ORDER — CYCLOBENZAPRINE HCL 10 MG PO TABS: 10.0000 mg | ORAL_TABLET | Freq: Three times a day (TID) | ORAL | 1 refills | Status: DC | PRN

## 2018-02-01 MED ORDER — CYCLOBENZAPRINE HCL 10 MG PO TABS
10.0000 mg | ORAL_TABLET | Freq: Once | ORAL | Status: AC
  Administered 2018-02-01: 10 mg via ORAL
  Filled 2018-02-01: qty 1

## 2018-02-01 NOTE — Discharge Instructions (Signed)
Preventing Injuries During Pregnancy Injuries can happen during pregnancy. Minor falls and accidents usually do not harm you or your baby. But some injuries can harm you and your baby. Tell your doctor about any injury you suffer. What can I do to avoid injuries? Safety  Remove rugs and loose objects on the floor.  Wear comfortable shoes that have a good grip. Do not wear shoes that have high heels.  Always wear your seat belt in the car. The lap belt should be below your belly. Always drive safely.  Do not ride on a motorcycle. Activity  Do not take part in rough and violent activities or sports.  Avoid: ? Walking on wet or slippery floors. ? Lifting heavy pots of boiling or hot liquids. ? Fixing electrical problems. ? Being near fires. General instructions  Take over-the-counter and prescription medicines only as told by your doctor.  Know your blood type and the blood type of the baby's father.  If you are a victim of domestic violence: ? Call your local emergency services (911 in the [place] ? Contact the [employer] Violence Hotline for help and support. Get help right away if:  You fall on your belly or receive any serious blow to your belly.  You have a stiff neck or neck pain after a fall or an injury.  You get a headache or have problems with vision after an injury.  You do not feel the baby move or the baby is not moving as much as normal.  You have been a victim of domestic violence or any other kind of attack.  You have been in a car accident.  You have bleeding from your vagina.  Fluid is leaking from your vagina.  You start to have cramping or pain in your belly (contractions).  You have very bad pain in your lower back.  You feel weak or pass out (faint).  You start to throw up (vomit) after an injury.  You have been burned. Summary  Some injuries that happen during pregnancy can do harm to the baby.  Tell your doctor about any  injury.  Take steps to avoid injury. This includes removing rugs and loose objects on the floor. Always wear your seat belt in the car.  Do not take part in rough and violent activities or sports.  Get help right away if you have any serious accident or injury. This information is not intended to replace advice given to you by your health care provider. Make sure you discuss any questions you have with your health care provider. Document Released: 06/22/2010 Document Revised: 05/29/2016 Document Reviewed: 05/29/2016 Elsevier Interactive Patient Education  2017 [employer].
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
## 2018-02-03 ENCOUNTER — Encounter (HOSPITAL_COMMUNITY): Payer: Self-pay

## 2018-02-03 ENCOUNTER — Encounter (HOSPITAL_COMMUNITY): Payer: Self-pay | Admitting: *Deleted

## 2018-02-12 ENCOUNTER — Encounter: Payer: Self-pay | Admitting: Women's Health

## 2018-02-18 ENCOUNTER — Inpatient Hospital Stay (HOSPITAL_BASED_OUTPATIENT_CLINIC_OR_DEPARTMENT_OTHER): Payer: Medicaid Other

## 2018-02-18 ENCOUNTER — Observation Stay (HOSPITAL_COMMUNITY)
Admission: AD | Admit: 2018-02-18 | Discharge: 2018-02-19 | Disposition: A | Discharge status: Home / Self Care | Payer: Medicaid Other | Source: Ambulatory Visit | Attending: Obstetrics and Gynecology | Admitting: Obstetrics and Gynecology

## 2018-02-18 ENCOUNTER — Telehealth: Payer: Self-pay | Admitting: *Deleted

## 2018-02-18 ENCOUNTER — Ambulatory Visit (INDEPENDENT_AMBULATORY_CARE_PROVIDER_SITE_OTHER): Payer: Medicaid Other | Admitting: *Deleted

## 2018-02-18 ENCOUNTER — Encounter (HOSPITAL_COMMUNITY): Payer: Self-pay | Admitting: *Deleted

## 2018-02-18 ENCOUNTER — Encounter: Payer: Self-pay | Admitting: *Deleted

## 2018-02-18 VITALS — BP 91/60 | HR 99 | Wt 165.0 lb

## 2018-02-18 DIAGNOSIS — O368130 Decreased fetal movements, third trimester, not applicable or unspecified: Secondary | ICD-10-CM | POA: Diagnosis not present

## 2018-02-18 DIAGNOSIS — Z3A35 35 weeks gestation of pregnancy: Secondary | ICD-10-CM | POA: Diagnosis not present

## 2018-02-18 DIAGNOSIS — O289 Unspecified abnormal findings on antenatal screening of mother: Secondary | ICD-10-CM | POA: Diagnosis not present

## 2018-02-18 DIAGNOSIS — Z331 Pregnant state, incidental: Secondary | ICD-10-CM

## 2018-02-18 DIAGNOSIS — O368390 Maternal care for abnormalities of the fetal heart rate or rhythm, unspecified trimester, not applicable or unspecified: Secondary | ICD-10-CM

## 2018-02-18 DIAGNOSIS — J45909 Unspecified asthma, uncomplicated: Secondary | ICD-10-CM | POA: Insufficient documentation

## 2018-02-18 DIAGNOSIS — O288 Other abnormal findings on antenatal screening of mother: Secondary | ICD-10-CM | POA: Diagnosis not present

## 2018-02-18 DIAGNOSIS — O3680X1 Pregnancy with inconclusive fetal viability, fetus 1: Secondary | ICD-10-CM | POA: Diagnosis not present

## 2018-02-18 DIAGNOSIS — Z3490 Encounter for supervision of normal pregnancy, unspecified, unspecified trimester: Secondary | ICD-10-CM

## 2018-02-18 DIAGNOSIS — Z79899 Other long term (current) drug therapy: Secondary | ICD-10-CM | POA: Insufficient documentation

## 2018-02-18 DIAGNOSIS — O3680X0 Pregnancy with inconclusive fetal viability, not applicable or unspecified: Secondary | ICD-10-CM

## 2018-02-18 DIAGNOSIS — Z3A36 36 weeks gestation of pregnancy: Secondary | ICD-10-CM

## 2018-02-18 DIAGNOSIS — Z1389 Encounter for screening for other disorder: Secondary | ICD-10-CM

## 2018-02-18 LAB — COMPREHENSIVE METABOLIC PANEL
ALK PHOS: 93 U/L (ref 38–126)
ALT: 11 U/L (ref 0–44)
AST: 19 U/L (ref 15–41)
Albumin: 3.7 g/dL (ref 3.5–5.0)
Anion gap: 11 (ref 5–15)
BUN: <5 mg/dL — ABNORMAL LOW (ref 6–20)
CALCIUM: 9.2 mg/dL (ref 8.9–10.3)
CO2: 20 mmol/L — AB (ref 22–32)
CREATININE: 0.54 mg/dL (ref 0.44–1.00)
Chloride: 105 mmol/L (ref 98–111)
GFR calc Af Amer: >60 mL/min (ref >60)
GFR calc non Af Amer: >60 mL/min (ref >60)
GLUCOSE: 68 mg/dL — AB (ref 70–99)
Potassium: 3.7 mmol/L (ref 3.5–5.1)
SODIUM: 136 mmol/L (ref 135–145)
Total Bilirubin: 0.5 mg/dL (ref 0.3–1.2)
Total Protein: 7.7 g/dL (ref 6.5–8.1)

## 2018-02-18 LAB — URINALYSIS, ROUTINE W REFLEX MICROSCOPIC
BILIRUBIN URINE: NEGATIVE
Glucose, UA: NEGATIVE mg/dL
HGB URINE DIPSTICK: NEGATIVE
Ketones, ur: 80 mg/dL — AB
NITRITE: NEGATIVE
Protein, ur: 30 mg/dL — AB
Specific Gravity, Urine: 1.018 (ref 1.005–1.030)
pH: 8 (ref 5.0–8.0)

## 2018-02-18 LAB — TSH: TSH: 0.232 u[IU]/mL — ABNORMAL LOW (ref 0.350–4.500)

## 2018-02-18 LAB — POCT URINALYSIS DIPSTICK OB
Blood, UA: NEGATIVE
GLUCOSE, UA: NEGATIVE
KETONES UA: NEGATIVE
Leukocytes, UA: NEGATIVE
Nitrite, UA: NEGATIVE

## 2018-02-18 LAB — TYPE AND SCREEN
ABO/RH(D): B POS
Antibody Screen: NEGATIVE

## 2018-02-18 LAB — CBC
HCT: 32.7 % — ABNORMAL LOW (ref 36.0–46.0)
HEMOGLOBIN: 10.1 g/dL — AB (ref 12.0–15.0)
MCH: 20.8 pg — ABNORMAL LOW (ref 26.0–34.0)
MCHC: 30.9 g/dL (ref 30.0–36.0)
MCV: 67.4 fL — ABNORMAL LOW (ref 78.0–100.0)
Platelets: 152 10*3/uL (ref 150–400)
RBC: 4.85 MIL/uL (ref 3.87–5.11)
RDW: 16.4 % — ABNORMAL HIGH (ref 11.5–15.5)
WBC: 5.6 10*3/uL (ref 4.0–10.5)

## 2018-02-18 LAB — T4, FREE: Free T4: 0.9 ng/dL (ref 0.82–1.77)

## 2018-02-18 MED ORDER — DOCUSATE SODIUM 100 MG PO CAPS: 100.0000 mg | ORAL_CAPSULE | Freq: Every day | ORAL | Status: DC

## 2018-02-18 MED ORDER — CALCIUM CARBONATE ANTACID 500 MG PO CHEW
2.0000 | CHEWABLE_TABLET | ORAL | Status: DC | PRN
  Administered 2018-02-19: 400 mg via ORAL
  Filled 2018-02-18: qty 2

## 2018-02-18 MED ORDER — FAMOTIDINE IN NACL 20-0.9 MG/50ML-% IV SOLN
20.0000 mg | Freq: Once | INTRAVENOUS | Status: AC
  Administered 2018-02-18: 20 mg via INTRAVENOUS
  Filled 2018-02-18: qty 50

## 2018-02-18 MED ORDER — M.V.I. ADULT IV INJ
Freq: Once | INTRAVENOUS | Status: AC
  Administered 2018-02-18: 18:00:00 via INTRAVENOUS
  Filled 2018-02-18: qty 1000

## 2018-02-18 MED ORDER — SODIUM CHLORIDE 0.9 % IV SOLN
8.0000 mg | Freq: Once | INTRAVENOUS | Status: AC
  Administered 2018-02-18: 8 mg via INTRAVENOUS
  Filled 2018-02-18: qty 4

## 2018-02-18 MED ORDER — DEXTROSE-NACL 5-0.45 % IV SOLN
INTRAVENOUS | Status: DC
  Administered 2018-02-18 – 2018-02-19 (×2): via INTRAVENOUS

## 2018-02-18 MED ORDER — DEXTROSE 5 % IN LACTATED RINGERS IV BOLUS
1000.0000 mL | Freq: Once | INTRAVENOUS | Status: AC
  Administered 2018-02-18: 1000 mL via INTRAVENOUS

## 2018-02-18 MED ORDER — CYCLOBENZAPRINE HCL 10 MG PO TABS
10.0000 mg | ORAL_TABLET | Freq: Once | ORAL | Status: AC
  Administered 2018-02-18: 10 mg via ORAL
  Filled 2018-02-18: qty 1

## 2018-02-18 MED ORDER — LACTATED RINGERS IV BOLUS
1000.0000 mL | Freq: Once | INTRAVENOUS | Status: AC
  Administered 2018-02-18: 1000 mL via INTRAVENOUS

## 2018-02-18 MED ORDER — SERTRALINE HCL 50 MG PO TABS
50.0000 mg | ORAL_TABLET | Freq: Once | ORAL | Status: AC
  Administered 2018-02-18: 50 mg via ORAL
  Filled 2018-02-18: qty 1

## 2018-02-18 MED ORDER — ZOLPIDEM TARTRATE 5 MG PO TABS: 5.0000 mg | ORAL_TABLET | Freq: Every evening | ORAL | Status: DC | PRN

## 2018-02-18 MED ORDER — PRENATAL MULTIVITAMIN CH: 1.0000 | ORAL_TABLET | Freq: Every day | ORAL | Status: DC

## 2018-02-18 MED ORDER — ACETAMINOPHEN 325 MG PO TABS
650.0000 mg | ORAL_TABLET | ORAL | Status: DC | PRN
  Administered 2018-02-19: 650 mg via ORAL
  Filled 2018-02-18 (×2): qty 2

## 2018-02-18 NOTE — Telephone Encounter (Signed)
Patient called stating she wanted to be seen to be checked to see if she is dehydrated.  She also states she has had decreased fetal movement only getting 3 kicks an hour and has been like that for the past couple of days.  She is taking Benadryl to help her sleep too.  Informed patient she could come in for NST and have urine dipped but would not see a provider.  Could go to [facility] if she felt like she needed fluids.  Pt stated she wanted to come here first. Will put on schedule.

## 2018-02-18 NOTE — MAU Note (Signed)
Pt reports she was seen in office today for NST and ? Problem with tracing and sent here for u/s and ivf's

## 2018-02-18 NOTE — Plan of Care (Signed)

## 2018-02-18 NOTE — MAU Provider Note (Signed)
History     CSN: [id number]  Arrival date and time: 02/18/18 1352   First Provider Initiated Contact with Patient 02/18/18 1449      Chief Complaint  Patient presents with  . Nausea  . Back Pain  . Pregnancy Ultrasound   HPI   Charlotte Mccormick is a 32 y.o. female [phone] @ 59w4dhere in MAU with nausea/vomiting that started a few days ago. Says she has not been able to eat or drink anything in the last few days. Says she has not taken any medication for the n/v. Says she feels very weak with no energy. Says she has been very depressed and doesn't think she has been keeping her medications down for anxiety depression. She presented to the office today with decreased fetal movement. She did not have a reactive tracing in the office today and was sent here for further fetal monitoring and BPP. She complains of lower back pain, no flank pain. The back pain is not new. This has been going on throughout her pregnancy. She has an RX at home for flexeril however cannot take it at home because it makes her sleepy and she has a small child done.   OB History    Gravida  5   Para  4   Term  4   Preterm  0   AB  0   Living  2     SAB  0   TAB  0   Ectopic  0   Multiple      Live Births  2           Past Medical History:  Diagnosis Date  . Abnormal uterine bleeding (AUB) 12/12/2015  . Anemia   . Anxiety   . Asthma   . Constipation   . Depression   . Fibroid   . Genital herpes   . GERD (gastroesophageal reflux disease)   . History of chlamydia   . Hyperemesis   . IBS (irritable bowel syndrome)   . Insomnia   . Mental disorder    PTSD, ANXIETY,Depression  . PTSD (post-traumatic stress disorder)   . Sinusitis   . Vaginal dryness 12/12/2015    Past Surgical History:  Procedure Laterality Date  . COLONOSCOPY     2016  . COLONOSCOPY N/A 04/08/2016   Procedure: COLONOSCOPY;  Surgeon: SDanie Binder MD;  [facility];  Service: Endoscopy;   Laterality: N/A;  10:15 AM  . ESOPHAGOGASTRODUODENOSCOPY N/A 04/08/2016   Procedure: ESOPHAGOGASTRODUODENOSCOPY (EGD);  Surgeon: SDanie Binder MD;  Location: [facility];  Service: Endoscopy;  Laterality: N/A;  . PERINEUM REPAIR    . UPPER GASTROINTESTINAL ENDOSCOPY  2016    Family History  Problem Relation Age of Onset  . Heart disease Mother   . Hypertension Mother   . Hypertension Father   . Hyperlipidemia Father   . Diabetes Father   . Cancer Father   . Epilepsy Brother   . Eczema Daughter   . Colon cancer Neg Hx     Social History   Tobacco Use  . Smoking status: Never Smoker  . Smokeless tobacco: Never Used  Substance Use Topics  . Alcohol use: Yes    Frequency: Never    Comment: occ wine  . Drug use: No    Allergies:  Allergies  Allergen Reactions  . Bee Venom Swelling and Other (See Comments)    Reaction:  Localized swelling   . Penicillins Itching and Other (See  Comments)    Has patient had a PCN reaction causing immediate rash, facial/tongue/throat swelling, SOB or lightheadedness with hypotension: No Has patient had a PCN reaction causing severe rash involving mucus membranes or skin necrosis: No Has patient had a PCN reaction that required hospitalization No Has patient had a PCN reaction occurring within the last 10 years: Yes If all of the above answers are "NO", then may proceed with Cephalosporin use.   . Reglan [Metoclopramide] Anxiety    Medications Prior to Admission  Medication Sig Dispense Refill Last Dose  . albuterol (PROVENTIL HFA;VENTOLIN HFA) 108 (90 Base) MCG/ACT inhaler INHALE 2 PUFFS BY MOUTH EVERY 6 HOURS AS NEEDED FOR WHEEZING OR SHORTNESS OF BREATH (Patient not taking: Reported on 02/18/2018) 8.5 g 3 Not Taking  . buPROPion (WELLBUTRIN SR) 200 MG 12 hr tablet Take 1 tablet (200 mg total) by mouth 2 (two) times daily. 60 tablet 6 Taking  . Calcium Carbonate Antacid (TUMS PO) Take by mouth 6 (six) times daily.    Taking  .  cyclobenzaprine (FLEXERIL) 10 MG tablet Take 1 tablet (10 mg total) by mouth 3 (three) times daily as needed for muscle spasms. 30 tablet 1 Taking  . ferrous sulfate 325 (65 FE) MG EC tablet Take 325 mg by mouth as needed.    Taking  . Melatonin 3-10 MG TABS Take by mouth.   Taking  . sertraline (ZOLOFT) 50 MG tablet Take 1 tablet (50 mg total) by mouth daily. 30 tablet 11 Taking   Results for orders placed or performed during the hospital encounter of 02/18/18 (from the past 48 hour(s))  Urinalysis, Routine w reflex microscopic     Status: Abnormal   Collection Time: 02/18/18  2:24 PM  Result Value Ref Range   Color, Urine YELLOW YELLOW   APPearance HAZY (A) CLEAR   Specific Gravity, Urine 1.018 1.005 - 1.030   pH 8.0 5.0 - 8.0   Glucose, UA NEGATIVE NEGATIVE mg/dL   Hgb urine dipstick NEGATIVE NEGATIVE   Bilirubin Urine NEGATIVE NEGATIVE   Ketones, ur 80 (A) NEGATIVE mg/dL   Protein, ur 30 (A) NEGATIVE mg/dL   Nitrite NEGATIVE NEGATIVE   Leukocytes, UA SMALL (A) NEGATIVE   RBC / HPF 0-5 0 - 5 RBC/hpf   WBC, UA 6-10 0 - 5 WBC/hpf   Bacteria, UA RARE (A) NONE SEEN   Squamous Epithelial / LPF 0-5 0 - 5   Mucus PRESENT     Comment: Performed at [facility], [address] [place], [place] [address]  CBC     Status: Abnormal   Collection Time: 02/18/18  4:22 PM  Result Value Ref Range   WBC 5.6 4.0 - 10.5 K/uL   RBC 4.85 3.87 - 5.11 MIL/uL   Hemoglobin 10.1 (L) 12.0 - 15.0 g/dL   HCT 32.7 (L) 36.0 - 46.0 %   MCV 67.4 (L) 78.0 - 100.0 fL   MCH 20.8 (L) 26.0 - 34.0 pg   MCHC 30.9 30.0 - 36.0 g/dL   RDW 16.4 (H) 11.5 - 15.5 %   Platelets 152 150 - 400 K/uL    Comment: Performed at [facility], [address], [place], [place] [address]  Comprehensive metabolic panel     Status: Abnormal   Collection Time: 02/18/18  4:22 PM  Result Value Ref Range   Sodium 136 135 - 145 mmol/L   Potassium 3.7 3.5 - 5.1 mmol/L   Chloride 105 98 - 111 mmol/L   CO2 20 (L)  22 - 32 mmol/L    Glucose, Bld 68 (L) 70 - 99 mg/dL   BUN <5 (L) 6 - 20 mg/dL   Creatinine, Ser 0.54 0.44 - 1.00 mg/dL   Calcium 9.2 8.9 - 10.3 mg/dL   Total Protein 7.7 6.5 - 8.1 g/dL   Albumin 3.7 3.5 - 5.0 g/dL   AST 19 15 - 41 U/L   ALT 11 0 - 44 U/L   Alkaline Phosphatase 93 38 - 126 U/L   Total Bilirubin 0.5 0.3 - 1.2 mg/dL   GFR calc non Af Amer >60 >60 mL/min   GFR calc Af Amer >60 >60 mL/min    Comment: (NOTE) The eGFR has been calculated using the CKD EPI equation. This calculation has not been validated in all clinical situations. eGFR's persistently <60 mL/min signify possible Chronic Kidney Disease.    Anion gap 11 5 - 15    Comment: Performed at [facility], [address], [place], [place] [address]  TSH     Status: Abnormal   Collection Time: 02/18/18  4:22 PM  Result Value Ref Range   TSH 0.232 (L) 0.350 - 4.500 uIU/mL    Comment: Performed by a 3rd Generation assay with a functional sensitivity of <=0.01 uIU/mL. Performed at [facility], [address], [place], [place] [address]    [place] Mfm Fetal Bpp Wo Non Stress  Result Date: 02/18/2018 ----------------------------------------------------------------------  OBSTETRICS REPORT                       (Signed Final 02/18/2018 05:43 pm) ---------------------------------------------------------------------- Patient Info  ID #:       [id number]                          D.O.B.:  [birth year] (32 yrs)  Name:       Charlotte Mccormick-                Visit Date: 02/18/2018 05:32 pm              Madilyn Fireman ---------------------------------------------------------------------- Performed By  Performed By:     Felecia Jan        Referred By:      MAU Nursing-                    RDMS                                     MAU/Triage  Attending:        Tama High MD        Location:         [facility] ---------------------------------------------------------------------- Orders   #  Description                          Code         Ordered By    1  [place] MFM FETAL BPP WO NON              30940.76     [place]   2  [place] MFM UA CORD DOPPLER               80881.10     JENNIFER RASCH  ----------------------------------------------------------------------   #  Order #  Accession #                 Episode #   1  [id number]                  [id number]                  [id number]   2  [id number]                  [id number]                  [id number]  ---------------------------------------------------------------------- Indications   Decreased fetal movement                       O36.8190   Non-reactive NST                               O28.9   [redacted] weeks gestation of pregnancy                Z3A.35  ---------------------------------------------------------------------- Fetal Evaluation  Num Of Fetuses:         1  Fetal Heart Rate(bpm):  129  Cardiac Activity:       Observed  Presentation:           Cephalic  Amniotic Fluid  AFI FV:      Within normal limits  AFI Sum(cm)     %Tile       Largest Pocket(cm)  15.24           56          5.08  RUQ(cm)       RLQ(cm)       LUQ(cm)        LLQ(cm)  1.71          3.74          4.71           5.08 ---------------------------------------------------------------------- Biophysical Evaluation  Amniotic F.V:   Within normal limits       F. Tone:        Not Observed  F. Movement:    Not Observed               Score:          4/8  F. Breathing:   Observed ---------------------------------------------------------------------- OB History  Gravidity:    3         Term:   2  Living:       2 ---------------------------------------------------------------------- Gestational Age  LMP:           35w 4d        Date:  06/14/17                 EDD:   03/21/18  Best:          35w 4d     Det. By:  LMP  (06/14/17)          EDD:   03/21/18 ---------------------------------------------------------------------- Anatomy  Stomach:               Appears normal, left   Bladder:                Appears normal                          sided ---------------------------------------------------------------------- Doppler - Fetal Vessels  Umbilical Artery   S/D     %tile                                            ADFV    RDFV  2.66       61                                                No      No ---------------------------------------------------------------------- Impression  Patient is here for BPP. She had NST at her Ob's office that  was not reactive. She also has c/o nausea and vomiting.  Amniotic fluid is normal. Fetal breathing movements met the  criteria. Fetal movements and tone did not meet the criteria.  Umbilical artery Doppler showed normal forward diastolic flow.  BPP 4/8. I reviewed the NST performed at MAU and it is  reactive. Taking NST into account, the BPP is 6/10 now.  D/w Noni Saupe, NP. ---------------------------------------------------------------------- Recommendations  -Patient to be monitored (NST) for at least 2 hours. If no  decelerations are seen and NST is reactive, intermittent  monitoring may be performed.  -Repeat BPP tomorrow. ----------------------------------------------------------------------                  Tama High, MD Electronically Signed Final Report   02/18/2018 05:43 pm ----------------------------------------------------------------------  [place] Mfm Ua Cord Doppler  Result Date: 02/18/2018 ----------------------------------------------------------------------  OBSTETRICS REPORT                       (Signed Final 02/18/2018 05:43 pm) ---------------------------------------------------------------------- Patient Info  ID #:       [id number]                          D.O.B.:  07-Apr-1986 (32 yrs)  Name:       Charlotte Mccormick-                Visit Date: 02/18/2018 05:32 pm              Madilyn Fireman ---------------------------------------------------------------------- Performed By  Performed By:     Felecia Jan        Referred By:      MAU Nursing-                    RDMS                                      MAU/Triage  Attending:        Tama High MD        Location:         [facility] ---------------------------------------------------------------------- Orders   #  Description                          Code         Ordered By   1  [place] MFM FETAL BPP WO NON              93903.00     [place]   2  [place] MFM UA CORD DOPPLER               [id number]     [facility]  ----------------------------------------------------------------------   #  Order #                    Accession #                 Episode #   1  [id number]                  [id number]                  [id number]   2  [id number]                  [id number]                  [id number]  ---------------------------------------------------------------------- Indications   Decreased fetal movement                       O36.8190   Non-reactive NST                               O28.9   [redacted] weeks gestation of pregnancy                Z3A.35  ---------------------------------------------------------------------- Fetal Evaluation  Num Of Fetuses:         1  Fetal Heart Rate(bpm):  129  Cardiac Activity:       Observed  Presentation:           Cephalic  Amniotic Fluid  AFI FV:      Within normal limits  AFI Sum(cm)     %Tile       Largest Pocket(cm)  15.24           56          5.08  RUQ(cm)       RLQ(cm)       LUQ(cm)        LLQ(cm)  1.71          3.74          4.71           5.08 ---------------------------------------------------------------------- Biophysical Evaluation  Amniotic F.V:   Within normal limits       F. Tone:        Not Observed  F. Movement:    Not Observed               Score:          4/8  F. Breathing:   Observed ---------------------------------------------------------------------- OB History  Gravidity:    3         Term:   2  Living:       2 ---------------------------------------------------------------------- Gestational Age  LMP:           35w 4d        Date:  06/14/17                 EDD:   03/21/18  Best:           35w 4d     Det. By:  LMP  (06/14/17)          EDD:   03/21/18 ---------------------------------------------------------------------- Anatomy  Stomach:               Appears  normal, left   Bladder:                Appears normal                         sided ---------------------------------------------------------------------- Doppler - Fetal Vessels  Umbilical Artery   S/D     %tile                                            ADFV    RDFV  2.66       61                                                No      No ---------------------------------------------------------------------- Impression  Patient is here for BPP. She had NST at her Ob's office that  was not reactive. She also has c/o nausea and vomiting.  Amniotic fluid is normal. Fetal breathing movements met the  criteria. Fetal movements and tone did not meet the criteria.  Umbilical artery Doppler showed normal forward diastolic flow.  BPP 4/8. I reviewed the NST performed at MAU and it is  reactive. Taking NST into account, the BPP is 6/10 now.  D/w Noni Saupe, NP. ---------------------------------------------------------------------- Recommendations  -Patient to be monitored (NST) for at least 2 hours. If no  decelerations are seen and NST is reactive, intermittent  monitoring may be performed.  -Repeat BPP tomorrow. ----------------------------------------------------------------------                  Tama High, MD Electronically Signed Final Report   02/18/2018 05:43 pm ----------------------------------------------------------------------  Review of Systems  Gastrointestinal: Positive for nausea and vomiting.  Musculoskeletal: Positive for back pain.  Psychiatric/Behavioral: The patient is nervous/anxious.    Physical Exam   Blood pressure 115/69, pulse (!) 110, temperature 98.4 F (36.9 C), temperature source Oral, resp. rate 16, height _0  (1.575 m), weight 74.4 kg, last menstrual period 06/14/2017, SpO2 100 %, not currently  breastfeeding.  Physical Exam  Constitutional: She is oriented to person, place, and time. She appears well-developed and well-nourished. No distress.  HENT:  Head: Normocephalic.  Respiratory: Effort normal.  GI: Soft. She exhibits no distension. There is no tenderness. There is no rebound and no CVA tenderness.  Genitourinary:  Genitourinary Comments:    Musculoskeletal: Normal range of motion.  Neurological: She is alert and oriented to person, place, and time.  Skin: Skin is warm.  Psychiatric: Her behavior is normal.   Fetal Tracing: Baseline: 125 bpm Variability: Moderate  Accelerations: 15x15 Decelerations: None Toco: Occasional   MAU Course  Procedures  None  MDM  Urine shows >80 ketones  D5LR bolus X 1 LR bolus X 1 MVI bolus X 1 TSH, CMP, CBC  Free T3 and Free T4 added on.  BPP: Dr. Donalee Citrin called and discussed BPP results. Reactive NST so adjusted score is 6/10. Discussed with Dr. Glo Herring. Hold betamethasone at this time per Dr. Donalee Citrin. Flexeril given to patient in MAU per patient requests.  Dr. Glo Herring down to MAU to see the patient.   Assessment and Plan   A:  1. Pregnancy with uncertain fetal viability, fetus 1   2. [redacted] weeks gestation of  pregnancy   3. Non-reassuring electronic fetal monitoring tracing     P:  Admit to high risk OB for observation D5 @ 125 ml/hr  Rasch, Artist Pais, NP 02/18/2018 8:21 PM
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
## 2018-02-19 ENCOUNTER — Observation Stay (HOSPITAL_BASED_OUTPATIENT_CLINIC_OR_DEPARTMENT_OTHER): Payer: Medicaid Other

## 2018-02-19 ENCOUNTER — Other Ambulatory Visit: Payer: Self-pay

## 2018-02-19 ENCOUNTER — Other Ambulatory Visit: Payer: Self-pay | Admitting: Obstetrics & Gynecology

## 2018-02-19 DIAGNOSIS — O368130 Decreased fetal movements, third trimester, not applicable or unspecified: Secondary | ICD-10-CM | POA: Diagnosis not present

## 2018-02-19 DIAGNOSIS — Z3A35 35 weeks gestation of pregnancy: Secondary | ICD-10-CM | POA: Diagnosis not present

## 2018-02-19 DIAGNOSIS — O3680X1 Pregnancy with inconclusive fetal viability, fetus 1: Secondary | ICD-10-CM | POA: Diagnosis not present

## 2018-02-19 LAB — T3, FREE: T3 FREE: 4 pg/mL (ref 2.0–4.4)

## 2018-02-19 LAB — CULTURE, OB URINE
Culture: NO GROWTH
Special Requests: NORMAL

## 2018-02-19 MED ORDER — PANTOPRAZOLE SODIUM 40 MG PO TBEC: 40.0000 mg | DELAYED_RELEASE_TABLET | Freq: Every day | ORAL | Status: DC

## 2018-02-19 MED ORDER — ONDANSETRON 4 MG PO TBDP: 4.0000 mg | ORAL_TABLET | Freq: Four times a day (QID) | ORAL | 1 refills | Status: DC | PRN

## 2018-02-19 NOTE — H&P (Signed)
Attestation signed by [name], MD at 02/18/2018 9:45 PM  Patient well known to me, has family stresses, feels poorly supported by family , especially frustrated by husband and his mother. Acknowleges that husband has begun to help by getting older child off to school in the mornings. Pt has to pay the PGM to watch the babies. Pt has had 3 days of GI upset. Will observe overnight, and repeat BPP in a.m Given d5 solution at present. Will continuous EFM.``  ```Attestation of Attending Supervision of Advanced Practitioner: Evaluation and management procedures were performed by the PA/NP/CNM/OB Fellow under my supervision/collaboration. Chart reviewed and agree with management and plan.  [name] 02/18/2018 9:43 PM        Expand All Collapse All    Show:Clear all [x]Manual[x]Template[]Copied  Added by: [x]Rasch, Artist Pais, NP  []Hover for details  History   CSN: [id number]  Arrival date and time: 02/18/18 1352   First Provider Initiated Contact with Patient 02/18/18 1449         Chief Complaint  Patient presents with  . Nausea  . Back Pain  . Pregnancy Ultrasound   HPI   Ms.Charlotte Mccormick is a 32 y.o. female [phone] @ 19w4dhere in MAU with nausea/vomiting that started a few days ago. Says she has not been able to eat or drink anything in the last few days. Says she has not taken any medication for the n/v. Says she feels very weak with no energy. Says she has been very depressed and doesn't think she has been keeping her medications down for anxiety depression. She presented to the office today with decreased fetal movement. She did not have a reactive tracing in the office today and was sent here for further fetal monitoring and BPP. She complains of lower back pain, no flank pain. The back pain is not new. This has been going on throughout her pregnancy. She has an RX at home for flexeril however cannot take it at home because it makes her sleepy  and she has a small child done.   OB History    Gravida  5   Para  4   Term  4   Preterm  0   AB  0   Living  2     SAB  0   TAB  0   Ectopic  0   Multiple      Live Births  2               Past Medical History:  Diagnosis Date  . Abnormal uterine bleeding (AUB) 12/12/2015  . Anemia   . Anxiety   . Asthma   . Constipation   . Depression   . Fibroid   . Genital herpes   . GERD (gastroesophageal reflux disease)   . History of chlamydia   . Hyperemesis   . IBS (irritable bowel syndrome)   . Insomnia   . Mental disorder    PTSD, ANXIETY,Depression  . PTSD (post-traumatic stress disorder)   . Sinusitis   . Vaginal dryness 12/12/2015         Past Surgical History:  Procedure Laterality Date  . COLONOSCOPY     2016  . COLONOSCOPY N/A 04/08/2016   Procedure: COLONOSCOPY;  Surgeon: SDanie Binder MD;  [facility]: [facility];  Service: Endoscopy;  Laterality: N/A;  10:15 AM  . ESOPHAGOGASTRODUODENOSCOPY N/A 04/08/2016   Procedure: ESOPHAGOGASTRODUODENOSCOPY (EGD);  Surgeon: SDanie Binder MD;  Location: [facility];  Service: Endoscopy;  Laterality: N/A;  . PERINEUM REPAIR    . UPPER GASTROINTESTINAL ENDOSCOPY  2016         Family History  Problem Relation Age of Onset  . Heart disease Mother   . Hypertension Mother   . Hypertension Father   . Hyperlipidemia Father   . Diabetes Father   . Cancer Father   . Epilepsy Brother   . Eczema Daughter   . Colon cancer Neg Hx     Social History        Tobacco Use  . Smoking status: Never Smoker  . Smokeless tobacco: Never Used  Substance Use Topics  . Alcohol use: Yes    Frequency: Never    Comment: occ wine  . Drug use: No    Allergies:       Allergies  Allergen Reactions  . Bee Venom Swelling and Other (See Comments)    Reaction:  Localized swelling   . Penicillins Itching and Other (See Comments)    Has patient had a PCN  reaction causing immediate rash, facial/tongue/throat swelling, SOB or lightheadedness with hypotension: No Has patient had a PCN reaction causing severe rash involving mucus membranes or skin necrosis: No Has patient had a PCN reaction that required hospitalization No Has patient had a PCN reaction occurring within the last 10 years: Yes If all of the above answers are "NO", then may proceed with Cephalosporin use.   . Reglan [Metoclopramide] Anxiety           Medications Prior to Admission  Medication Sig Dispense Refill Last Dose  . albuterol (PROVENTIL HFA;VENTOLIN HFA) 108 (90 Base) MCG/ACT inhaler INHALE 2 PUFFS BY MOUTH EVERY 6 HOURS AS NEEDED FOR WHEEZING OR SHORTNESS OF BREATH (Patient not taking: Reported on 02/18/2018) 8.5 g 3 Not Taking  . buPROPion (WELLBUTRIN SR) 200 MG 12 hr tablet Take 1 tablet (200 mg total) by mouth 2 (two) times daily. 60 tablet 6 Taking  . Calcium Carbonate Antacid (TUMS PO) Take by mouth 6 (six) times daily.    Taking  . cyclobenzaprine (FLEXERIL) 10 MG tablet Take 1 tablet (10 mg total) by mouth 3 (three) times daily as needed for muscle spasms. 30 tablet 1 Taking  . ferrous sulfate 325 (65 FE) MG EC tablet Take 325 mg by mouth as needed.    Taking  . Melatonin 3-10 MG TABS Take by mouth.   Taking  . sertraline (ZOLOFT) 50 MG tablet Take 1 tablet (50 mg total) by mouth daily. 30 tablet 11 Taking   LabResultsLast48Hours        Results for orders placed or performed during the hospital encounter of 02/18/18 (from the past 48 hour(s))  Urinalysis, Routine w reflex microscopic     Status: Abnormal   Collection Time: 02/18/18  2:24 PM  Result Value Ref Range   Color, Urine YELLOW YELLOW   APPearance HAZY (A) CLEAR   Specific Gravity, Urine 1.018 1.005 - 1.030   pH 8.0 5.0 - 8.0   Glucose, UA NEGATIVE NEGATIVE mg/dL   Hgb urine dipstick NEGATIVE NEGATIVE   Bilirubin Urine NEGATIVE NEGATIVE   Ketones, ur 80 (A) NEGATIVE mg/dL    Protein, ur 30 (A) NEGATIVE mg/dL   Nitrite NEGATIVE NEGATIVE   Leukocytes, UA SMALL (A) NEGATIVE   RBC / HPF 0-5 0 - 5 RBC/hpf   WBC, UA 6-10 0 - 5 WBC/hpf   Bacteria, UA RARE (A) NONE SEEN   Squamous Epithelial /  LPF 0-5 0 - 5   Mucus PRESENT     Comment: Performed at [facility], [address] [place], [place] [address]  CBC     Status: Abnormal   Collection Time: 02/18/18  4:22 PM  Result Value Ref Range   WBC 5.6 4.0 - 10.5 K/uL   RBC 4.85 3.87 - 5.11 MIL/uL   Hemoglobin 10.1 (L) 12.0 - 15.0 g/dL   HCT 32.7 (L) 36.0 - 46.0 %   MCV 67.4 (L) 78.0 - 100.0 fL   MCH 20.8 (L) 26.0 - 34.0 pg   MCHC 30.9 30.0 - 36.0 g/dL   RDW 16.4 (H) 11.5 - 15.5 %   Platelets 152 150 - 400 K/uL    Comment: Performed at [facility], [address] [place], [place] [address]  Comprehensive metabolic panel     Status: Abnormal   Collection Time: 02/18/18  4:22 PM  Result Value Ref Range   Sodium 136 135 - 145 mmol/L   Potassium 3.7 3.5 - 5.1 mmol/L   Chloride 105 98 - 111 mmol/L   CO2 20 (L) 22 - 32 mmol/L   Glucose, Bld 68 (L) 70 - 99 mg/dL   BUN <5 (L) 6 - 20 mg/dL   Creatinine, Ser 0.54 0.44 - 1.00 mg/dL   Calcium 9.2 8.9 - 10.3 mg/dL   Total Protein 7.7 6.5 - 8.1 g/dL   Albumin 3.7 3.5 - 5.0 g/dL   AST 19 15 - 41 U/L   ALT 11 0 - 44 U/L   Alkaline Phosphatase 93 38 - 126 U/L   Total Bilirubin 0.5 0.3 - 1.2 mg/dL   GFR calc non Af Amer >60 >60 mL/min   GFR calc Af Amer >60 >60 mL/min    Comment: (NOTE) The eGFR has been calculated using the CKD EPI equation. This calculation has not been validated in all clinical situations. eGFR's persistently <60 mL/min signify possible Chronic Kidney Disease.    Anion gap 11 5 - 15    Comment: Performed at [facility], [address] [place], [place] [address]  TSH     Status: Abnormal   Collection Time: 02/18/18  4:22 PM  Result Value Ref Range   TSH 0.232 (L) 0.350 - 4.500 uIU/mL     Comment: Performed by a 3rd Generation assay with a functional sensitivity of <=0.01 uIU/mL. Performed at [facility], [address], [place], [place] [address]       ImagingResults(Last48hours)  [place] Mfm Fetal Bpp Wo Non Stress  Result Date: 02/18/2018 ----------------------------------------------------------------------  OBSTETRICS REPORT                       (Signed Final 02/18/2018 05:43 pm) ---------------------------------------------------------------------- Patient Info  ID #:       [id number]                          D.O.B.:  November 19, 1985 (32 yrs)  Name:       Charlotte Mccormick-                Visit Date: 02/18/2018 05:32 pm              Madilyn Fireman ---------------------------------------------------------------------- Performed By  Performed By:     Felecia Jan        Referred By:      MAU Nursing-  RDMS                                     MAU/Triage  Attending:        Tama High MD        Location:         [facility] ---------------------------------------------------------------------- Orders   #  Description                          Code         Ordered By   1  [place] MFM FETAL BPP WO NON              76819.01     [place]   2  [place] MFM UA CORD DOPPLER               76820.02     [facility]  ----------------------------------------------------------------------   #  Order #                    Accession #                 Episode #   1  [id number]                  [id number]   2  [id number]                  [id number]                  [id number]  ---------------------------------------------------------------------- Indications   Decreased fetal movement                       O36.8190   Non-reactive NST                               O28.9   [redacted] weeks gestation of pregnancy                Z3A.35  ---------------------------------------------------------------------- Fetal Evaluation  Num Of Fetuses:         1  Fetal Heart  Rate(bpm):  129  Cardiac Activity:       Observed  Presentation:           Cephalic  Amniotic Fluid  AFI FV:      Within normal limits  AFI Sum(cm)     %Tile       Largest Pocket(cm)  15.24           56          5.08  RUQ(cm)       RLQ(cm)       LUQ(cm)        LLQ(cm)  1.71          3.74          4.71           5.08 ---------------------------------------------------------------------- Biophysical Evaluation  Amniotic F.V:   Within normal limits       F. Tone:        Not Observed  F. Movement:    Not Observed  Score:          4/8  F. Breathing:   Observed ---------------------------------------------------------------------- OB History  Gravidity:    3         Term:   2  Living:       2 ---------------------------------------------------------------------- Gestational Age  LMP:           35w 4d        Date:  06/14/17                 EDD:   03/21/18  Best:          35w 4d     Det. By:  LMP  (06/14/17)          EDD:   03/21/18 ---------------------------------------------------------------------- Anatomy  Stomach:               Appears normal, left   Bladder:                Appears normal                         sided ---------------------------------------------------------------------- Doppler - Fetal Vessels  Umbilical Artery   S/D     %tile                                            ADFV    RDFV  2.66       61                                                No      No ---------------------------------------------------------------------- Impression  Patient is here for BPP. She had NST at her Ob's office that  was not reactive. She also has c/o nausea and vomiting.  Amniotic fluid is normal. Fetal breathing movements met the  criteria. Fetal movements and tone did not meet the criteria.  Umbilical artery Doppler showed normal forward diastolic flow.  BPP 4/8. I reviewed the NST performed at MAU and it is  reactive. Taking NST into account, the BPP is 6/10 now.  D/w Noni Saupe, NP.  ---------------------------------------------------------------------- Recommendations  -Patient to be monitored (NST) for at least 2 hours. If no  decelerations are seen and NST is reactive, intermittent  monitoring may be performed.  -Repeat BPP tomorrow. ----------------------------------------------------------------------                  Tama High, MD Electronically Signed Final Report   02/18/2018 05:43 pm ----------------------------------------------------------------------  [place] Mfm Ua Cord Doppler  Result Date: 02/18/2018 ----------------------------------------------------------------------  OBSTETRICS REPORT                       (Signed Final 02/18/2018 05:43 pm) ---------------------------------------------------------------------- Patient Info  ID #:       [id number]                          D.O.B.:  February 01, 1986 (32 yrs)  Name:       Charlotte Mccormick-                Visit Date: 02/18/2018 05:32 pm  Madilyn Fireman ---------------------------------------------------------------------- Performed By  Performed By:     Felecia Jan        Referred By:      [place]                                     MAU/Triage  Attending:        Tama High MD        Location:         [facility] ---------------------------------------------------------------------- Orders   #  Description                          Code         Ordered By   1  [place] MFM FETAL BPP WO NON              76819.01     [place]   2  [place] MFM UA CORD DOPPLER               76820.02     [facility]ENNIF[facility]  ----------------------------------------------------------------------   #  Order #                    Accession #                 Episode #   1  [id number]                  [id number]                  [id number]   2  [id number]                  [id number]                  [id number]  ---------------------------------------------------------------------- Indications   Decreased fetal movement                        O36.8190   Non-reactive NST                               O28.9   [redacted] weeks gestation of pregnancy                Z3A.35  ---------------------------------------------------------------------- Fetal Evaluation  Num Of Fetuses:         1  Fetal Heart Rate(bpm):  129  Cardiac Activity:       Observed  Presentation:           Cephalic  Amniotic Fluid  AFI FV:      Within normal limits  AFI Sum(cm)     %Tile       Largest Pocket(cm)  15.24           56          5.08  RUQ(cm)       RLQ(cm)       LUQ(cm)        LLQ(cm)  1.71          3.74          4.71  5.08 ---------------------------------------------------------------------- Biophysical Evaluation  Amniotic F.V:   Within normal limits       F. Tone:        Not Observed  F. Movement:    Not Observed               Score:          4/8  F. Breathing:   Observed ---------------------------------------------------------------------- OB History  Gravidity:    3         Term:   2  Living:       2 ---------------------------------------------------------------------- Gestational Age  LMP:           35w 4d        Date:  06/14/17                 EDD:   03/21/18  Best:          35w 4d     Det. By:  LMP  (06/14/17)          EDD:   03/21/18 ---------------------------------------------------------------------- Anatomy  Stomach:               Appears normal, left   Bladder:                Appears normal                         sided ---------------------------------------------------------------------- Doppler - Fetal Vessels  Umbilical Artery   S/D     %tile                                            ADFV    RDFV  2.66       61                                                No      No ---------------------------------------------------------------------- Impression  Patient is here for BPP. She had NST at her Ob's office that  was not reactive. She also has c/o nausea and vomiting.  Amniotic fluid is normal. Fetal breathing movements met the  criteria. Fetal  movements and tone did not meet the criteria.  Umbilical artery Doppler showed normal forward diastolic flow.  BPP 4/8. I reviewed the NST performed at MAU and it is  reactive. Taking NST into account, the BPP is 6/10 now.  D/w Noni Saupe, NP. ---------------------------------------------------------------------- Recommendations  -Patient to be monitored (NST) for at least 2 hours. If no  decelerations are seen and NST is reactive, intermittent  monitoring may be performed.  -Repeat BPP tomorrow. ----------------------------------------------------------------------                  Tama High, MD Electronically Signed Final Report   02/18/2018 05:43 pm ----------------------------------------------------------------------   Review of Systems  Gastrointestinal: Positive for nausea and vomiting.  Musculoskeletal: Positive for back pain.  Psychiatric/Behavioral: The patient is nervous/anxious.    Physical Exam   Blood pressure 115/69, pulse (!) 110, temperature 98.4 F (36.9 C), temperature source Oral, resp. rate 16, height 5' 2" (1.575 m), weight 74.4 kg, last menstrual period 06/14/2017, SpO2 100 %, not currently breastfeeding.  Physical Exam  Constitutional: She is oriented to  person, place, and time. She appears well-developed and well-nourished. No distress.  HENT:  Head: Normocephalic.  Respiratory: Effort normal.  GI: Soft. She exhibits no distension. There is no tenderness. There is no rebound and no CVA tenderness.  Genitourinary:  Genitourinary Comments:    Musculoskeletal: Normal range of motion.  Neurological: She is alert and oriented to person, place, and time.  Skin: Skin is warm.  Psychiatric: Her behavior is normal.   Fetal Tracing: Baseline: 125 bpm Variability: Moderate  Accelerations: 15x15 Decelerations: None Toco: Occasional   MAU Course  Procedures  None  MDM  Urine shows >80 ketones  D5LR bolus X 1 LR bolus X 1 MVI bolus X 1 TSH, CMP, CBC   Free T3 and Free T4 added on.  BPP: Dr. Donalee Citrin called and discussed BPP results. Reactive NST so adjusted score is 6/10. Discussed with Dr. Glo Herring. Hold betamethasone at this time per Dr. Donalee Citrin. Flexeril given to patient in MAU per patient requests.  Dr. Glo Herring down to MAU to see the patient.   Assessment and Plan   A:  1. Pregnancy with uncertain fetal viability, fetus 1   2. [redacted] weeks gestation of pregnancy   3. Non-reassuring electronic fetal monitoring tracing     P:  Admit to high risk OB for observation D5 @ 125 ml/hr  Rasch, Artist Pais, NP 02/18/2018 8:21 PM          Cosigned by: [name], MD at 02/18/2018 9:45 PM

## 2018-02-19 NOTE — Plan of Care (Deleted)
  Problem: Education: Goal: Knowledge of General Education information will improve Description Including pain rating scale, medication(s)/side effects and non-pharmacologic comfort measures 02/19/2018 0014 by Lars Masson, RN Outcome: Progressing 02/18/2018 2200 by Lars Masson, RN Outcome: Progressing   Problem: Health Behavior/Discharge Planning: Goal: Ability to manage health-related needs will improve 02/19/2018 0014 by Lars Masson, RN Outcome: Progressing 02/18/2018 2200 by Lars Masson, RN Outcome: Progressing   Problem: Clinical Measurements: Goal: Ability to maintain clinical measurements within normal limits will improve 02/19/2018 0014 by Lars Masson, RN Outcome: Progressing 02/18/2018 2200 by Lars Masson, RN Outcome: Progressing   Problem: Clinical Measurements: Goal: Will remain free from infection 02/19/2018 0014 by Lars Masson, RN Outcome: Progressing 02/18/2018 2200 by Lars Masson, RN Outcome: Progressing

## 2018-02-19 NOTE — Discharge Instructions (Signed)

## 2018-02-19 NOTE — Progress Notes (Signed)
Between 6 and 7 am. Pt had X 3 early decels . Position changed will continue to monitor.

## 2018-02-19 NOTE — Progress Notes (Signed)
[name]) NOTE  Charlotte Mccormick is a 32 y.o. G3P2002 at [redacted]w[redacted]d  who is admitted for nonreactive NST in office, done for decreased FM, while ill from GI upset, emotional upset, then sent for BPP that was 6/10. This morning BPP to be repeated..   Fetal presentation is cephalic. Length of Stay:  0  Days  Subjective: Pt remains diffusely anxious,  Husband is at bedside, on videogame  Tolerating PO this morning Patient reports the fetal movement as active. Patient reports uterine contraction  activity as none. Patient reports  vaginal bleeding as none. Patient describes fluid per vagina as None.  Vitals:  Blood pressure 103/68, pulse 88, temperature 98.4 F (36.9 C), temperature source Oral, resp. rate 16, height 5\' 2"  (1.575 m), weight 74.4 kg, last menstrual period 06/14/2017, SpO2 100 %, not currently breastfeeding. Physical Examination:  General appearance - alert, well appearing, and in no distress, oriented to person, place, and time, anxious and chronically ill appearing Heart - normal rate and regular rhythm Abdomen - soft, nontender, nondistended Fundal Height:  size equals dates Cervical Exam: Not evaluated.  Extremities: extremities normal, atraumatic, no cyanosis or edema and Homans sign is negative, no sign of DVT with DTRs 2+ bilaterally Membranes:intact  Fetal Monitoring:  Baseline: 145 bpm, Variability: Good {> 6 bpm), Accelerations: Reactive and Decelerations: Absent  Labs:  Results for orders placed or performed during the hospital encounter of 02/18/18 (from the past 24 hour(s))  Urinalysis, Routine w reflex microscopic   Collection Time: 02/18/18  2:24 PM  Result Value Ref Range   Color, Urine YELLOW YELLOW   APPearance HAZY (A) CLEAR   Specific Gravity, Urine 1.018 1.005 - 1.030   pH 8.0 5.0 - 8.0   Glucose, UA NEGATIVE NEGATIVE mg/dL   Hgb urine dipstick NEGATIVE NEGATIVE   Bilirubin Urine NEGATIVE NEGATIVE   Ketones, ur 80  (A) NEGATIVE mg/dL   Protein, ur 30 (A) NEGATIVE mg/dL   Nitrite NEGATIVE NEGATIVE   Leukocytes, UA SMALL (A) NEGATIVE   RBC / HPF 0-5 0 - 5 RBC/hpf   WBC, UA 6-10 0 - 5 WBC/hpf   Bacteria, UA RARE (A) NONE SEEN   Squamous Epithelial / LPF 0-5 0 - 5   Mucus PRESENT   CBC   Collection Time: 02/18/18  4:22 PM  Result Value Ref Range   WBC 5.6 4.0 - 10.5 K/uL   RBC 4.85 3.87 - 5.11 MIL/uL   Hemoglobin 10.1 (L) 12.0 - 15.0 g/dL   HCT 32.7 (L) 36.0 - 46.0 %   MCV 67.4 (L) 78.0 - 100.0 fL   MCH 20.8 (L) 26.0 - 34.0 pg   MCHC 30.9 30.0 - 36.0 g/dL   RDW 16.4 (H) 11.5 - 15.5 %   Platelets 152 150 - 400 K/uL  Comprehensive metabolic panel   Collection Time: 02/18/18  4:22 PM  Result Value Ref Range   Sodium 136 135 - 145 mmol/L   Potassium 3.7 3.5 - 5.1 mmol/L   Chloride 105 98 - 111 mmol/L   CO2 20 (L) 22 - 32 mmol/L   Glucose, Bld 68 (L) 70 - 99 mg/dL   BUN <5 (L) 6 - 20 mg/dL   Creatinine, Ser 0.54 0.44 - 1.00 mg/dL   Calcium 9.2 8.9 - 10.3 mg/dL   Total Protein 7.7 6.5 - 8.1 g/dL   Albumin 3.7 3.5 - 5.0 g/dL   AST 19 15 - 41 U/L   ALT 11 0 - 44 U/L  Alkaline Phosphatase 93 38 - 126 U/L   Total Bilirubin 0.5 0.3 - 1.2 mg/dL   GFR calc non Af Amer >60 >60 mL/min   GFR calc Af Amer >60 >60 mL/min   Anion gap 11 5 - 15  TSH   Collection Time: 02/18/18  4:22 PM  Result Value Ref Range   TSH 0.232 (L) 0.350 - 4.500 uIU/mL  T3, free   Collection Time: 02/18/18  4:22 PM  Result Value Ref Range   T3, Free 4.0 2.0 - 4.4 pg/mL  T4, free   Collection Time: 02/18/18  4:22 PM  Result Value Ref Range   Free T4 0.90 0.82 - 1.77 ng/dL  Type and screen [place]   Collection Time: 02/18/18  4:22 PM  Result Value Ref Range   ABO/RH(D) B POS    Antibody Screen NEG    Sample Expiration      02/21/2018 Performed at [facility], [address] [place], [place] [address]   Results for orders placed or performed in visit on 02/18/18 (from the past 24  hour(s))  POC Urinalysis Dipstick OB   Collection Time: 02/18/18 10:23 AM  Result Value Ref Range   Color, UA     Clarity, UA     Glucose, UA Negative Negative   Bilirubin, UA     Ketones, UA neg    Spec Grav, UA     Blood, UA neg    pH, UA     POC Protein UA Trace Negative, Trace   Urobilinogen, UA     Nitrite, UA neg    Leukocytes, UA Negative Negative   Appearance     Odor      Imaging Studies:     Currently EPIC will not allow sonographic studies to automatically populate into notes.  In the meantime, copy and paste results into note or free text.  Medications:  Scheduled . docusate sodium  100 mg Oral Daily  . prenatal multivitamin  1 tablet Oral Q1200   I have reviewed the patient's current medications.  ASSESSMENT: Patient Active Problem List   Diagnosis Date Noted  . Pregnancy with uncertain fetal viability, fetus 1 02/18/2018  . Depression with anxiety 01/23/2017  . PTSD (post-traumatic stress disorder)  05/01/2016    PLAN: BPP today. D/c if bpp 8/8 OR 8/10  Charlotte Mccormick 02/19/2018,7:51 AM    Patient ID: Charlotte Mccormick, female   DOB: Jun 05, 1985, 32 y.o.   MRN: [id number]

## 2018-02-19 NOTE — Discharge Summary (Signed)
Physician Discharge Summary  Patient ID: Charlotte Mccormick MRN: [id number] DOB/AGE: [birth year] 32 y.o.  Admit date: 02/18/2018 Discharge date: 02/19/2018  Admission Diagnoses:[id number] biophysical profile 6/8  Discharge Diagnoses:  Active Problems:   Pregnancy with uncertain fetal viability, fetus 1  Reassuring fetal surveillance Discharged Condition: good   Attestation signed by FJonnie Kind MD at 02/18/2018 9:45 PM    Patient well known to me, has family stresses, feels poorly supported by family , especially frustrated by husband and his mother. Acknowleges that husband has begun to help by getting older child off to school in the mornings.  Pt has to pay the PGM to watch the babies.  Pt has had 3 days of GI upset.  Will observe overnight, and repeat BPP in a.m  Given d5 solution at present. Will continuous EFM.``     ```Attestation of Attending Supervision of Advanced Practitioner: Evaluation and management procedures were performed by the PA/NP/CNM/OB Fellow under my supervision/collaboration. Chart reviewed and agree with management and plan.     JJonnie Kind 02/18/2018 9:43 PM             Expand All Collapse All     untitled imageuntitled image  Show:Clear all  ManualTemplateCopied     Added by:  RLezlie Lye NP     Hover for detailsuntitled image                                        History        CSN: [id number]    Arrival date and time: 02/18/18 1352      First Provider Initiated Contact with Patient 02/18/18 1449                    Chief Complaint    Patient presents with    .   Nausea    .   Back Pain    .   Pregnancy Ultrasound       HPI      Charlotte Mccormick a 32y.o. female [id number]@ 348w4dere in MAU with nausea/vomiting that started a few days ago. Says she has not been able to eat or drink anything in the  last few days. Says she has not taken any medication for the n/v. Says she feels very weak with no energy. Says she has been very depressed and doesn't think she has been keeping her medications down for anxiety depression. She presented to the office today with decreased fetal movement. She did not have a reactive tracing in the office today and was sent here for further fetal monitoring and BPP. She complains of lower back pain, no flank pain. The back pain is not new. This has been going on throughout her pregnancy. She has an RX at home for flexeril however cannot take it at home because it makes her sleepy and she has a small child done.                               OB History           Gravida    5        Para    4        Term    4  Preterm    0        AB    0        Living    2             SAB    0        TAB    0        Ectopic    0        Multiple             Live Births    2                                        Past Medical History:    Diagnosis   Date    .   Abnormal uterine bleeding (AUB)   12/12/2015    .   Anemia        .   Anxiety        .   Asthma        .   Constipation        .   Depression        .   Fibroid        .   Genital herpes        .   GERD (gastroesophageal reflux disease)        .   History of chlamydia        .   Hyperemesis        .   IBS (irritable bowel syndrome)        .   Insomnia        .   Mental disorder            PTSD, ANXIETY,Depression    .   PTSD (post-traumatic stress disorder)        .   Sinusitis        .   Vaginal dryness   12/12/2015                            Past Surgical History:    Procedure   Laterality   Date    .   COLONOSCOPY                2016     .   COLONOSCOPY   N/A   04/08/2016        Procedure: COLONOSCOPY;  Surgeon: Danie Binder, MD;  [facility];  Service: Endoscopy;  Laterality: N/A;  10:15 AM    .   ESOPHAGOGASTRODUODENOSCOPY   N/A   04/08/2016        Procedure: ESOPHAGOGASTRODUODENOSCOPY (EGD);  Surgeon: Danie Binder, MD;  Location: [facility];  Service: Endoscopy;  Laterality: N/A;    .   PERINEUM REPAIR            .   UPPER GASTROINTESTINAL ENDOSCOPY       2016                            Family History    Problem   Relation   Age of Onset    .   Heart disease   Mother        .   Hypertension   Mother        .  Hypertension   Father        .   Hyperlipidemia   Father        .   Diabetes   Father        .   Cancer   Father        .   Epilepsy   Brother        .   Eczema   Daughter        .   Colon cancer   Neg Hx               Social History                         Tobacco Use    .   Smoking status:   Never Smoker    .   Smokeless tobacco:   Never Used    Substance Use Topics    .   Alcohol use:   Yes            Frequency:   Never            Comment: occ wine    .   Drug use:   No          Allergies:                     Allergies    Allergen   Reactions    .   Bee Venom   Swelling and Other (See Comments)            Reaction:  Localized swelling     .   Penicillins   Itching and Other (See Comments)            Has patient had a PCN reaction causing immediate rash, facial/tongue/throat swelling, SOB or lightheadedness with hypotension: No  Has patient had a PCN reaction causing severe rash involving mucus membranes or skin necrosis: No  Has patient had a PCN reaction that required hospitalization No  Has patient had a PCN reaction occurring within the last  10 years: Yes  If all of the above answers are "NO", then may proceed with Cephalosporin use.       .   Reglan [Metoclopramide]   Anxiety                                    Medications Prior to Admission    Medication   Sig   Dispense   Refill   Last Dose    .   albuterol (PROVENTIL HFA;VENTOLIN HFA) 108 (90 Base) MCG/ACT inhaler   INHALE 2 PUFFS BY MOUTH EVERY 6 HOURS AS NEEDED FOR WHEEZING OR SHORTNESS OF BREATH (Patient not taking: Reported on 02/18/2018)   8.5 g   3   Not Taking    .   buPROPion (WELLBUTRIN SR) 200 MG 12 hr tablet   Take 1 tablet (200 mg total) by mouth 2 (two) times daily.   60 tablet   6   Taking    .   Calcium Carbonate Antacid (TUMS PO)   Take by mouth 6 (six) times daily.            Taking    .   cyclobenzaprine (FLEXERIL) 10 MG tablet   Take 1 tablet (10 mg total) by mouth 3 (three) times daily as needed for  muscle spasms.   30 tablet   1   Taking    .   ferrous sulfate 325 (65 FE) MG EC tablet   Take 325 mg by mouth as needed.            Taking    .   Melatonin 3-10 MG TABS   Take by mouth.           Taking    .   sertraline (ZOLOFT) 50 MG tablet   Take 1 tablet (50 mg total) by mouth daily.   30 tablet   11   Taking        Lab Results Last 48 Hours                          Results for orders placed or performed during the hospital encounter of 02/18/18 (from the past 48 hour(s))    Urinalysis, Routine w reflex microscopic     Status: Abnormal        Collection Time: 02/18/18  2:24 PM    Result   Value   Ref Range        Color, Urine   YELLOW   YELLOW        APPearance   HAZY (A)   CLEAR        Specific Gravity, Urine   1.018   1.005 - 1.030        pH   8.0   5.0 - 8.0        Glucose, UA   NEGATIVE   NEGATIVE mg/dL        Hgb urine dipstick   NEGATIVE   NEGATIVE         Bilirubin Urine   NEGATIVE   NEGATIVE        Ketones, ur   80 (A)   NEGATIVE mg/dL        Protein, ur   30 (A)   NEGATIVE mg/dL        Nitrite   NEGATIVE   NEGATIVE        Leukocytes, UA   SMALL (A)   NEGATIVE        RBC / HPF   0-5   0 - 5 RBC/hpf        WBC, UA   6-10   0 - 5 WBC/hpf        Bacteria, UA   RARE (A)   NONE SEEN        Squamous Epithelial / LPF   0-5   0 - 5        Mucus   PRESENT                Comment:   Performed at [facility], [address], [place], [place] [address]    CBC     Status: Abnormal        Collection Time: 02/18/18  4:22 PM    Result   Value   Ref Range        WBC   5.6   4.0 - 10.5 K/uL        RBC   4.85   3.87 - 5.11 MIL/uL        Hemoglobin   10.1 (L)   12.0 - 15.0 g/dL        HCT   32.7 (L)   36.0 - 46.0 %        MCV  67.4 (L)   78.0 - 100.0 fL        MCH   20.8 (L)   26.0 - 34.0 pg        MCHC   30.9   30.0 - 36.0 g/dL        RDW   16.4 (H)   11.5 - 15.5 %        Platelets   152   150 - 400 K/uL            Comment:   Performed at [facility], [address], [place], [place] [address]    Comprehensive metabolic panel     Status: Abnormal        Collection Time: 02/18/18  4:22 PM    Result   Value   Ref Range        Sodium   136   135 - 145 mmol/L        Potassium   3.7   3.5 - 5.1 mmol/L        Chloride   105   98 - 111 mmol/L        CO2   20 (L)   22 - 32 mmol/L        Glucose, Bld   68 (L)   70 - 99 mg/dL        BUN   <5 (L)   6 - 20 mg/dL        Creatinine, Ser   0.54   0.44 - 1.00 mg/dL        Calcium   9.2   8.9 - 10.3 mg/dL        Total Protein   7.7   6.5 - 8.1 g/dL        Albumin   3.7   3.5 - 5.0 g/dL        AST   19   15 - 41 U/L        ALT    11   0 - 44 U/L        Alkaline Phosphatase   93   38 - 126 U/L        Total Bilirubin   0.5   0.3 - 1.2 mg/dL        GFR calc non Af Amer   >60   >60 mL/min        GFR calc Af Amer   >60   >60 mL/min            Comment:   (NOTE)  The eGFR has been calculated using the CKD EPI equation.  This calculation has not been validated in all clinical situations.  eGFR's persistently <60 mL/min signify possible Chronic Kidney  Disease.           Anion gap   11   5 - 15            Comment:   Performed at [facility], [address] [place], [place] [address]    TSH     Status: Abnormal        Collection Time: 02/18/18  4:22 PM    Result   Value   Ref Range        TSH   0.232 (L)   0.350 - 4.500 uIU/mL            Comment:   Performed by a 3rd Generation assay with a functional sensitivity of <=0.01 uIU/mL.  Performed at [facility],  [place], [place] [address]                 Imaging Results (Last 48 hours)    [place] Mfm Fetal Bpp Wo Non Stress     Result Date: 02/18/2018  ----------------------------------------------------------------------  OBSTETRICS REPORT                       (Signed Final 02/18/2018 05:43 pm) ---------------------------------------------------------------------- Patient Info  ID #:       [id number]                          D.O.B.:  Nov 30, 1985 (32 yrs)  Name:       Charlotte Coma SHIELDS-                Visit Date: 02/18/2018 05:32 pm              Madilyn Fireman ---------------------------------------------------------------------- Performed By  Performed By:     Felecia Jan        Referred By:      [place]                                     MAU/Triage  Attending:        Tama High MD        Location:         [facility] ---------------------------------------------------------------------- Orders   #  Description                           Code         Ordered By   1  [place] MFM FETAL BPP WO NON              41423.95     [place]   2  [place] MFM UA CORD DOPPLER               32023.34     [facility]  ----------------------------------------------------------------------   #  Order #                    Accession #                 Episode #   1  [id number]                  [id number]   2  [id number]                  [id number]  ---------------------------------------------------------------------- Indications   Decreased fetal movement                       O36.8190   Non-reactive NST                               O28.9   [redacted] weeks gestation of pregnancy  Z3A.35  ---------------------------------------------------------------------- Fetal Evaluation  Num Of Fetuses:         1  Fetal Heart Rate(bpm):  129  Cardiac Activity:       Observed  Presentation:           Cephalic  Amniotic Fluid  AFI FV:      Within normal limits  AFI Sum(cm)     %Tile       Largest Pocket(cm)  15.24           56          5.08  RUQ(cm)       RLQ(cm)       LUQ(cm)        LLQ(cm)  1.71          3.74          4.71           5.08 ---------------------------------------------------------------------- Biophysical Evaluation  Amniotic F.V:   Within normal limits       F. Tone:        Not Observed  F. Movement:    Not Observed               Score:          4/8  F. Breathing:   Observed ---------------------------------------------------------------------- OB History  Gravidity:    3         Term:   2  Living:       2 ---------------------------------------------------------------------- Gestational Age  LMP:           35w 4d        Date:  06/14/17                 EDD:   03/21/18  Best:          35w 4d     Det. By:  LMP  (06/14/17)          EDD:   03/21/18 ---------------------------------------------------------------------- Anatomy  Stomach:               Appears normal, left   Bladder:                 Appears normal                         sided ---------------------------------------------------------------------- Doppler - Fetal Vessels  Umbilical Artery   S/D     %tile                                            ADFV    RDFV  2.66       61                                                No      No ---------------------------------------------------------------------- Impression  Patient is here for BPP. She had NST at her Ob's office that  was not reactive. She also has c/o nausea and vomiting.  Amniotic fluid is normal. Fetal breathing movements met the  criteria. Fetal movements and tone did not meet the criteria.  Umbilical artery Doppler showed normal forward diastolic flow.  BPP 4/8. I reviewed the NST performed at MAU and it  is  reactive. Taking NST into account, the BPP is 6/10 now.  D/w Noni Saupe, NP. ---------------------------------------------------------------------- Recommendations  -Patient to be monitored (NST) for at least 2 hours. If no  decelerations are seen and NST is reactive, intermittent  monitoring may be performed.  -Repeat BPP tomorrow. ----------------------------------------------------------------------                  Tama High, MD Electronically Signed Final Report   02/18/2018 05:43 pm ----------------------------------------------------------------------     [place] Mfm Ua Cord Doppler     Result Date: 02/18/2018  ----------------------------------------------------------------------  OBSTETRICS REPORT                       (Signed Final 02/18/2018 05:43 pm) ---------------------------------------------------------------------- Patient Info  ID #:       [id number]                          D.O.B.:  12-Sep-1985 (32 yrs)  Name:       Charlotte Coma SHIELDS-                Visit Date: 02/18/2018 05:32 pm              Madilyn Fireman ---------------------------------------------------------------------- Performed By  Performed By:     Felecia Jan        Referred By:      [place]                                     MAU/Triage  Attending:        Tama High MD        Location:         [facility] ---------------------------------------------------------------------- Orders   #  Description                          Code         Ordered By   1  [place] MFM FETAL BPP WO NON              96222.97     [place]   2  [place] MFM UA CORD DOPPLER               98921.19     [facility]  ----------------------------------------------------------------------   #  Order #                    Accession #                 Episode #   1  [id number]                  [id number]                  [id number]   2  [id number]                  [id number]                  [id number]  ---------------------------------------------------------------------- Indications   Decreased fetal movement  O36.8190   Non-reactive NST                               O28.9   [redacted] weeks gestation of pregnancy                Z3A.35  ---------------------------------------------------------------------- Fetal Evaluation  Num Of Fetuses:         1  Fetal Heart Rate(bpm):  129  Cardiac Activity:       Observed  Presentation:           Cephalic  Amniotic Fluid  AFI FV:      Within normal limits  AFI Sum(cm)     %Tile       Largest Pocket(cm)  15.24           56          5.08  RUQ(cm)       RLQ(cm)       LUQ(cm)        LLQ(cm)  1.71          3.74          4.71           5.08 ---------------------------------------------------------------------- Biophysical Evaluation  Amniotic F.V:   Within normal limits       F. Tone:        Not Observed  F. Movement:    Not Observed               Score:          4/8  F. Breathing:   Observed ---------------------------------------------------------------------- OB History  Gravidity:    3         Term:   2  Living:       2 ---------------------------------------------------------------------- Gestational Age  LMP:           35w 4d        Date:  06/14/17                  EDD:   03/21/18  Best:          35w 4d     Det. By:  LMP  (06/14/17)          EDD:   03/21/18 ---------------------------------------------------------------------- Anatomy  Stomach:               Appears normal, left   Bladder:                Appears normal                         sided ---------------------------------------------------------------------- Doppler - Fetal Vessels  Umbilical Artery   S/D     %tile                                            ADFV    RDFV  2.66       61                                                No      No ---------------------------------------------------------------------- Impression  Patient is here for BPP. She had NST  at her Ob's office that  was not reactive. She also has c/o nausea and vomiting.  Amniotic fluid is normal. Fetal breathing movements met the  criteria. Fetal movements and tone did not meet the criteria.  Umbilical artery Doppler showed normal forward diastolic flow.  BPP 4/8. I reviewed the NST performed at MAU and it is  reactive. Taking NST into account, the BPP is 6/10 now.  D/w Noni Saupe, NP. ---------------------------------------------------------------------- Recommendations  -Patient to be monitored (NST) for at least 2 hours. If no  decelerations are seen and NST is reactive, intermittent  monitoring may be performed.  -Repeat BPP tomorrow. ----------------------------------------------------------------------                  Tama High, MD Electronically Signed Final Report   02/18/2018 05:43 pm ----------------------------------------------------------------------       Review of Systems   Gastrointestinal: Positive for nausea and vomiting.   Musculoskeletal: Positive for back pain.   Psychiatric/Behavioral: The patient is nervous/anxious.        Physical Exam       Blood pressure 115/69, pulse (!) 110, temperature 98.4 F (36.9 C), temperature source Oral, resp. rate 16, height _0  (1.575 m), weight  74.4 kg, last menstrual period 06/14/2017, SpO2 100 %, not currently breastfeeding.     Physical Exam   Constitutional: She is oriented to person, place, and time. She appears well-developed and well-nourished. No distress.   HENT:   Head: Normocephalic.   Respiratory: Effort normal.   GI: Soft. She exhibits no distension. There is no tenderness. There is no rebound and no CVA tenderness.  Genitourinary:  Genitourinary Comments:    Musculoskeletal: Normal range of motion.  Neurological: She is alert and oriented to person, place, and time.   Skin: Skin is warm.  Psychiatric: Her behavior is normal.      Fetal Tracing:  Baseline: 125 bpm  Variability: Moderate   Accelerations: 15x15  Decelerations: None  Toco: Occasional       MAU Course    Procedures   None     MDM     Urine shows >80 ketones   D5LR bolus X 1  LR bolus X 1  MVI bolus X 1  TSH, CMP, CBC   Free T3 and Free T4 added on.   BPP: Dr. Donalee Citrin called and discussed BPP results. Reactive NST so adjusted score is 6/10. Discussed with Dr. Glo Herring. Hold betamethasone at this time per Dr. Donalee Citrin.  Flexeril given to patient in MAU per patient requests.   Dr. Glo Herring down to MAU to see the patient.       Assessment and Plan       A:      1.   Pregnancy with uncertain fetal viability, fetus 1     2.   [redacted] weeks gestation of pregnancy     3.   Non-reassuring electronic fetal monitoring tracing           P:     Admit to high risk OB for observation  D5 @ 125 ml/hr     Rasch, Artist Pais, NP  02/18/2018  8:21 PM       Repeat BPP 9/19 was 8/8 with reactive NST. She was discharged to f/u at [facility] Course:   Consults: None  Significant Diagnostic Studies: radiology: Ultrasound: BPP  Treatments: IV hydration  Discharge Exam: Blood pressure 112/75, pulse (!) 114, temperature (!) 97.5 F (36.4 C), temperature source Oral,  resp. rate 18, height _0  (1.575 m), weight 74.4 kg, last menstrual period 06/14/2017, SpO2 100 %, not currently breastfeeding. General appearance: alert, cooperative and no distress  Disposition: Discharge disposition: 01-Home or Self Care        Allergies as of 02/19/2018      Reactions   Bee Venom Swelling, Other (See Comments)   Reaction:  Localized swelling    Penicillins Itching, Other (See Comments)   Has patient had a PCN reaction causing immediate rash, facial/tongue/throat swelling, SOB or lightheadedness with hypotension: No Has patient had a PCN reaction causing severe rash involving mucus membranes or skin necrosis: No Has patient had a PCN reaction that required hospitalization No Has patient had a PCN reaction occurring within the last 10 years: Yes If all of the above answers are "NO", then may proceed with Cephalosporin use.   Reglan [metoclopramide] Anxiety      Medication List    TAKE these medications   albuterol 108 (90 Base) MCG/ACT inhaler Commonly known as:  PROVENTIL HFA;VENTOLIN HFA INHALE 2 PUFFS BY MOUTH EVERY 6 HOURS AS NEEDED FOR WHEEZING OR SHORTNESS OF BREATH   buPROPion 200 MG 12 hr tablet Commonly known as:  WELLBUTRIN SR Take 1 tablet (200 mg total) by mouth 2 (two) times daily.   cyclobenzaprine 10 MG tablet Commonly known as:  FLEXERIL Take 1 tablet (10 mg total) by mouth 3 (three) times daily as needed for muscle spasms.   diphenhydrAMINE 25 MG tablet Commonly known as:  BENADRYL Take 25 mg by mouth as needed for sleep.   sertraline 50 MG tablet Commonly known as:  ZOLOFT Take 1 tablet (50 mg total) by mouth daily.   TUMS PO Take 4 tablets by mouth as needed (heartburn).      Follow-up Information    [facility] Follow up in 4 day(s).   Specialty:  Obstetrics and Gynecology Contact information: [address] [place] [phone]          Signed: Emeterio Reeve 02/19/2018,  2:37 PM

## 2018-02-20 ENCOUNTER — Other Ambulatory Visit: Payer: Self-pay | Admitting: Women's Health

## 2018-02-23 ENCOUNTER — Other Ambulatory Visit (INDEPENDENT_AMBULATORY_CARE_PROVIDER_SITE_OTHER): Payer: Medicaid Other

## 2018-02-23 ENCOUNTER — Other Ambulatory Visit: Payer: Self-pay | Admitting: Women's Health

## 2018-02-23 ENCOUNTER — Encounter: Payer: Self-pay | Admitting: Women's Health

## 2018-02-23 ENCOUNTER — Ambulatory Visit (INDEPENDENT_AMBULATORY_CARE_PROVIDER_SITE_OTHER): Payer: Medicaid Other | Admitting: Women's Health

## 2018-02-23 VITALS — BP 111/76 | HR 110 | Wt 170.5 lb

## 2018-02-23 DIAGNOSIS — Z3483 Encounter for supervision of other normal pregnancy, third trimester: Secondary | ICD-10-CM

## 2018-02-23 DIAGNOSIS — O26843 Uterine size-date discrepancy, third trimester: Secondary | ICD-10-CM

## 2018-02-23 DIAGNOSIS — Z3A36 36 weeks gestation of pregnancy: Secondary | ICD-10-CM

## 2018-02-23 DIAGNOSIS — Z331 Pregnant state, incidental: Secondary | ICD-10-CM

## 2018-02-23 DIAGNOSIS — O368130 Decreased fetal movements, third trimester, not applicable or unspecified: Secondary | ICD-10-CM

## 2018-02-23 DIAGNOSIS — Z1389 Encounter for screening for other disorder: Secondary | ICD-10-CM | POA: Diagnosis not present

## 2018-02-23 LAB — POCT URINALYSIS DIPSTICK OB
Blood, UA: NEGATIVE
Glucose, UA: NEGATIVE
KETONES UA: NEGATIVE
Nitrite, UA: NEGATIVE
POC,PROTEIN,UA: NEGATIVE

## 2018-02-23 NOTE — Patient Instructions (Signed)
Charlotte Mccormick, I greatly value your feedback.  If you receive a survey following your visit with [place] today, we appreciate you taking the time to fill it out.  Thanks, [name], CNM, WHNP-BC   Call the office [phone]) or go to [facility] if:  You begin to have strong, frequent contractions  Your water breaks.  Sometimes it is a big gush of fluid, sometimes it is just a trickle that keeps getting your panties wet or running down your legs  You have vaginal bleeding.  It is normal to have a small amount of spotting if your cervix was checked.   You don't feel your baby moving like normal.  If you don't, get you something to eat and drink and lay down and focus on feeling your baby move.  You should feel at least 10 movements in 2 hours.  If you don't, you should call the office or go to [place] and Birth Information The normal length of a pregnancy is 39-41 weeks. Preterm labor is when labor starts before 37 completed weeks of pregnancy. What are the risk factors for preterm labor? Preterm labor is more likely to occur in women who:  Have certain infections during pregnancy such as a bladder infection, sexually transmitted infection, or infection inside the uterus (chorioamnionitis).  Have a shorter-than-normal cervix.  Have gone into preterm labor before.  Have had surgery on their cervix.  Are younger than age 28 or older than age 57.  Are African American.  Are pregnant with twins or multiple babies (multiple gestation).  Take street drugs or smoke while pregnant.  Do not gain enough weight while pregnant.  Became pregnant shortly after having been pregnant.  What are the symptoms of preterm labor? Symptoms of preterm labor include:  Cramps similar to those that can happen during a menstrual period. The cramps may happen with diarrhea.  Pain in the abdomen or lower back.  Regular uterine contractions that may feel like  tightening of the abdomen.  A feeling of increased pressure in the pelvis.  Increased watery or bloody mucus discharge from the vagina.  Water breaking (ruptured amniotic sac).  Why is it important to recognize signs of preterm labor? It is important to recognize signs of preterm labor because babies who are born prematurely may not be fully developed. This can put them at an increased risk for:  Long-term (chronic) heart and lung problems.  Difficulty immediately after birth with regulating body systems, including blood sugar, body temperature, heart rate, and breathing rate.  Bleeding in the brain.  Cerebral palsy.  Learning difficulties.  Death.  These risks are highest for babies who are born before 26 weeks of pregnancy. How is preterm labor treated? Treatment depends on the length of your pregnancy, your condition, and the health of your baby. It may involve:  Having a stitch (suture) placed in your cervix to prevent your cervix from opening too early (cerclage).  Taking or being given medicines, such as: ? Hormone medicines. These may be given early in pregnancy to help support the pregnancy. ? Medicine to stop contractions. ? Medicines to help mature the baby's lungs. These may be prescribed if the risk of delivery is high. ? Medicines to prevent your baby from developing cerebral palsy.  If the labor happens before 34 weeks of pregnancy, you may need to stay in the hospital. What should I do if I think I am in preterm labor? If you think  that you are going into preterm labor, call your health care provider right away. How can I prevent preterm labor in future pregnancies? To increase your chance of having a full-term pregnancy:  Do not use any tobacco products, such as cigarettes, chewing tobacco, and e-cigarettes. If you need help quitting, ask your health care provider.  Do not use street drugs or medicines that have not been prescribed to you during your  pregnancy.  Talk with your health care provider before taking any herbal supplements, even if you have been taking them regularly.  Make sure you gain a healthy amount of weight during your pregnancy.  Watch for infection. If you think that you might have an infection, get it checked right away.  Make sure to tell your health care provider if you have gone into preterm labor before.  This information is not intended to replace advice given to you by your health care provider. Make sure you discuss any questions you have with your health care provider. Document Released: 08/10/2003 Document Revised: 10/31/2015 Document Reviewed: 10/11/2015 Elsevier Interactive Patient Education  2018 [employer].   Sciatica Rehab Ask your health care provider which exercises are safe for you. Do exercises exactly as told by your health care provider and adjust them as directed. It is normal to feel mild stretching, pulling, tightness, or discomfort as you do these exercises, but you should stop right away if you feel sudden pain or your pain gets worse.Do not begin these exercises until told by your health care provider. Stretching and range of motion exercises These exercises warm up your muscles and joints and improve the movement and flexibility of your hips and your back. These exercises also help to relieve pain, numbness, and tingling. Exercise A: Sciatic nerve glide 1. Sit in a chair with your head facing down toward your chest. Place your hands behind your back. Let your shoulders slump forward. 2. Slowly straighten one of your knees while you tilt your head back as if you are looking toward the ceiling. Only straighten your leg as far as you can without making your symptoms worse. 3. Hold for __________ seconds. 4. Slowly return to the starting position. 5. Repeat with your other leg. Repeat __________ times. Complete this exercise __________ times a day. Exercise B: Knee to chest with hip  adduction and internal rotation  1. Lie on your back on a firm surface with both legs straight. 2. Bend one of your knees and move it up toward your chest until you feel a gentle stretch in your lower back and buttock. Then, move your knee toward the shoulder that is on the opposite side from your leg. ? Hold your leg in this position by holding onto the front of your knee. 3. Hold for __________ seconds. 4. Slowly return to the starting position. 5. Repeat with your other leg. Repeat __________ times. Complete this exercise __________ times a day. Exercise C: Prone extension on elbows  1. Lie on your abdomen on a firm surface. A bed may be too soft for this exercise. 2. Prop yourself up on your elbows. 3. Use your arms to help lift your chest up until you feel a gentle stretch in your abdomen and your lower back. ? This will place some of your body weight on your elbows. If this is uncomfortable, try stacking pillows under your chest. ? Your hips should stay down, against the surface that you are lying on. Keep your hip and back muscles relaxed. 4.  Hold for __________ seconds. 5. Slowly relax your upper body and return to the starting position. Repeat __________ times. Complete this exercise __________ times a day. Strengthening exercises These exercises build strength and endurance in your back. Endurance is the ability to use your muscles for a long time, even after they get tired. Exercise D: Pelvic tilt 1. Lie on your back on a firm surface. Bend your knees and keep your feet flat. 2. Tense your abdominal muscles. Tip your pelvis up toward the ceiling and flatten your lower back into the floor. ? To help with this exercise, you may place a small towel under your lower back and try to push your back into the towel. 3. Hold for __________ seconds. 4. Let your muscles relax completely before you repeat this exercise. Repeat __________ times. Complete this exercise __________ times a  day. Exercise E: Alternating arm and leg raises  1. Get on your hands and knees on a firm surface. If you are on a hard floor, you may want to use padding to cushion your knees, such as an exercise mat. 2. Line up your arms and legs. Your hands should be below your shoulders, and your knees should be below your hips. 3. Lift your left leg behind you. At the same time, raise your right arm and straighten it in front of you. ? Do not lift your leg higher than your hip. ? Do not lift your arm higher than your shoulder. ? Keep your abdominal and back muscles tight. ? Keep your hips facing the ground. ? Do not arch your back. ? Keep your balance carefully, and do not hold your breath. 4. Hold for __________ seconds. 5. Slowly return to the starting position and repeat with your right leg and your left arm. Repeat __________ times. Complete this exercise __________ times a day. Posture and body mechanics  Body mechanics refers to the movements and positions of your body while you do your daily activities. Posture is part of body mechanics. Good posture and healthy body mechanics can help to relieve stress in your body's tissues and joints. Good posture means that your spine is in its natural S-curve position (your spine is neutral), your shoulders are pulled back slightly, and your head is not tipped forward. The following are general guidelines for applying improved posture and body mechanics to your everyday activities. Standing   When standing, keep your spine neutral and your feet about hip-width apart. Keep a slight bend in your knees. Your ears, shoulders, and hips should line up.  When you do a task in which you stand in one place for a long time, place one foot up on a stable object that is 2-4 inches (5-10 cm) high, such as a footstool. This helps keep your spine neutral. Sitting   When sitting, keep your spine neutral and keep your feet flat on the floor. Use a footrest, if necessary,  and keep your thighs parallel to the floor. Avoid rounding your shoulders, and avoid tilting your head forward.  When working at a desk or a computer, keep your desk at a height where your hands are slightly lower than your elbows. Slide your chair under your desk so you are close enough to maintain good posture.  When working at a computer, place your monitor at a height where you are looking straight ahead and you do not have to tilt your head forward or downward to look at the screen. Resting   When lying down and  resting, avoid positions that are most painful for you.  If you have pain with activities such as sitting, bending, stooping, or squatting (flexion-based activities), lie in a position in which your body does not bend very much. For example, avoid curling up on your side with your arms and knees near your chest (fetal position).  If you have pain with activities such as standing for a long time or reaching with your arms (extension-based activities), lie with your spine in a neutral position and bend your knees slightly. Try the following positions: ? Lying on your side with a pillow between your knees. ? Lying on your back with a pillow under your knees. Lifting   When lifting objects, keep your feet at least shoulder-width apart and tighten your abdominal muscles.  Bend your knees and hips and keep your spine neutral. It is important to lift using the strength of your legs, not your back. Do not lock your knees straight out.  Always ask for help to lift heavy or awkward objects. This information is not intended to replace advice given to you by your health care provider. Make sure you discuss any questions you have with your health care provider. Document Released: 05/20/2005 Document Revised: 01/25/2016 Document Reviewed: 02/03/2015 Elsevier Interactive Patient Education  [employer].

## 2018-02-23 NOTE — Progress Notes (Signed)
LOW-RISK PREGNANCY VISIT Patient name: Charlotte Mccormick MRN [id number]  Date of birth: [birth year] Chief Complaint:   Routine Prenatal Visit (decreased baby movement)  History of Present Illness:   Charlotte Mccormick is a 32 y.o. G70P2002 female at [redacted]w[redacted]d with an Estimated Date of Delivery: 03/21/18 being seen today for ongoing management of a low-risk pregnancy.  Today she reports decreased fm, sciatica pain.  Had overnight stay at [facility] 9/18 d/t decreased fm and bpp 4/8, repeated bpp next morning and was 8/8 so was d/c'd.  Contractions: Irregular. Vag. Bleeding: None.  Movement: (!) Decreased. denies leaking of fluid. Review of Systems:   Pertinent items are noted in HPI Denies abnormal vaginal discharge w/ itching/odor/irritation, headaches, visual changes, shortness of breath, chest pain, abdominal pain, severe nausea/vomiting, or problems with urination or bowel movements unless otherwise stated above. Pertinent History Reviewed:  Reviewed past medical,surgical, social, obstetrical and family history.  Reviewed problem list, medications and allergies. Physical Assessment:   Vitals:   02/23/18 1221  BP: 111/76  Pulse: (!) 110  Weight: 170 lb 8 oz (77.3 kg)  Body mass index is 31.18 kg/m.        Physical Examination:   General appearance: Well appearing, and in no distress  Mental status: Alert, oriented to person, place, and time  Skin: Warm & dry  Cardiovascular: Normal heart rate noted  Respiratory: Normal respiratory effort, no distress  Abdomen: Soft, gravid, nontender  Pelvic: Cervical exam performed  Dilation: 1 Effacement (%): Thick Station: -2  Extremities: Edema: Trace  Fetal Status: Fetal Heart Rate (bpm): 135 Fundal Height: 33 cm Movement: (!) Decreased Presentation: Vertex  NST: FHR baseline 135 bpm, Variability: minimal , Accelerations:present, Decelerations:  Absent= Cat 2 d/t decreased variability, will get BPP Toco: q 5-81mins w/ ui   [place] 36+2 wks,BPP 6/8  no breathing,fhr 142 bpm,bilat adnexa's wnl,posterior pl gr 1,afi 14 cm,efw 2862 g 49%   Results for orders placed or performed in visit on 02/26/18 (from the past 24 hour(s))  POC Urinalysis Dipstick OB   Collection Time: 02/26/18 10:33 AM  Result Value Ref Range   Color, UA     Clarity, UA     Glucose, UA Negative Negative   Bilirubin, UA     Ketones, UA neg    Spec Grav, UA     Blood, UA neg    pH, UA     POC Protein UA Trace Negative, Trace   Urobilinogen, UA     Nitrite, UA neg    Leukocytes, UA Small (1+) (A) Negative   Appearance     Odor      Assessment & Plan:  1) Low-risk pregnancy G3P2002 at [redacted]w[redacted]d with an Estimated Date of Delivery: 03/21/18   2) Decreased fm, NST technically reactive w/ +accels, but decreased variability, BPP 6/8 (-2 for breathing), discussed all w/ LHE, he reviewed strip, no further work-up needed at this time, will start 2x/wk nst d/t persistent decreased fm   Meds: No orders of the defined types were placed in this encounter.  Labs/procedures today: nst, sve, bpp/efw  Plan:  Per LHE will start 2x/wk NST d/t persistent decreased fm  Reviewed: Preterm labor symptoms and general obstetric precautions including but not limited to vaginal bleeding, contractions, leaking of fluid and fetal movement were reviewed in detail with the patient.  All questions were answered  Follow-up: Return for thurs for lrob/nst.  Orders Placed This Encounter  Procedures  . US OB Follow Up  . POC Urinalysis  Dipstick OB   [place], [facility] 02/23/18

## 2018-02-23 NOTE — Progress Notes (Addendum)
[place] 36+2 wks,BPP 6/8 no breathing,fhr 142 bpm,bilat adnexa's wnl,posterior pl gr 1,afi 14 cm,efw 2862 g 49%

## 2018-02-26 ENCOUNTER — Encounter: Payer: Self-pay | Admitting: Advanced Practice Midwife

## 2018-02-26 ENCOUNTER — Ambulatory Visit (INDEPENDENT_AMBULATORY_CARE_PROVIDER_SITE_OTHER): Payer: Medicaid Other | Admitting: Advanced Practice Midwife

## 2018-02-26 VITALS — BP 119/78 | HR 102 | Wt 170.0 lb

## 2018-02-26 DIAGNOSIS — Z1389 Encounter for screening for other disorder: Secondary | ICD-10-CM | POA: Diagnosis not present

## 2018-02-26 DIAGNOSIS — Z3A36 36 weeks gestation of pregnancy: Secondary | ICD-10-CM

## 2018-02-26 DIAGNOSIS — O368130 Decreased fetal movements, third trimester, not applicable or unspecified: Secondary | ICD-10-CM | POA: Diagnosis not present

## 2018-02-26 DIAGNOSIS — Z3483 Encounter for supervision of other normal pregnancy, third trimester: Secondary | ICD-10-CM

## 2018-02-26 DIAGNOSIS — Z331 Pregnant state, incidental: Secondary | ICD-10-CM

## 2018-02-26 LAB — POCT URINALYSIS DIPSTICK OB
Blood, UA: NEGATIVE
Glucose, UA: NEGATIVE
Ketones, UA: NEGATIVE
Nitrite, UA: NEGATIVE

## 2018-02-26 MED ORDER — ACYCLOVIR 400 MG PO TABS: 400.0000 mg | ORAL_TABLET | Freq: Three times a day (TID) | ORAL | 2 refills | Status: DC
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
## 2018-02-26 NOTE — Patient Instructions (Signed)

## 2018-02-27 DIAGNOSIS — Z029 Encounter for administrative examinations, unspecified: Secondary | ICD-10-CM

## 2018-02-28 LAB — STREP GP B NAA+RFLX: STREP GP B NAA+RFLX: NEGATIVE

## 2018-03-02 ENCOUNTER — Ambulatory Visit (INDEPENDENT_AMBULATORY_CARE_PROVIDER_SITE_OTHER): Payer: Medicaid Other | Admitting: Women's Health

## 2018-03-02 ENCOUNTER — Encounter: Payer: Self-pay | Admitting: Women's Health

## 2018-03-02 VITALS — BP 113/73 | HR 112 | Wt 171.0 lb

## 2018-03-02 DIAGNOSIS — O368130 Decreased fetal movements, third trimester, not applicable or unspecified: Secondary | ICD-10-CM

## 2018-03-02 DIAGNOSIS — Z331 Pregnant state, incidental: Secondary | ICD-10-CM

## 2018-03-02 DIAGNOSIS — Z1389 Encounter for screening for other disorder: Secondary | ICD-10-CM | POA: Diagnosis not present

## 2018-03-02 DIAGNOSIS — Z3A37 37 weeks gestation of pregnancy: Secondary | ICD-10-CM | POA: Diagnosis not present

## 2018-03-02 DIAGNOSIS — Z3483 Encounter for supervision of other normal pregnancy, third trimester: Secondary | ICD-10-CM

## 2018-03-02 LAB — POCT URINALYSIS DIPSTICK OB
Blood, UA: NEGATIVE
Glucose, UA: NEGATIVE
KETONES UA: NEGATIVE
Leukocytes, UA: NEGATIVE
Nitrite, UA: NEGATIVE

## 2018-03-02 NOTE — Patient Instructions (Signed)
Charlotte Mccormick, I greatly value your feedback.  If you receive a survey following your visit with [place] today, we appreciate you taking the time to fill it out.  Thanks, [name], CNM, WHNP-BC   Call the office [phone]) or go to [facility] if:  You begin to have strong, frequent contractions  Your water breaks.  Sometimes it is a big gush of fluid, sometimes it is just a trickle that keeps getting your panties wet or running down your legs  You have vaginal bleeding.  It is normal to have a small amount of spotting if your cervix was checked.   You don't feel your baby moving like normal.  If you don't, get you something to eat and drink and lay down and focus on feeling your baby move.  You should feel at least 10 movements in 2 hours.  If you don't, you should call the office or go to [place] your lower back pain you may:  Purchase a pregnancy belt from [employer], [place], [place], etc and wear it while you are up and about  Take warm baths  Use a heating pad to your lower back for no longer than 20 minutes at a time, and do not place near abdomen  Take tylenol as needed. Please follow directions on the bottle  Kinesiology tape (can get from sporting goods store), google how to tape belly for pregnancy   Braxton Hicks Contractions Contractions of the uterus can occur throughout pregnancy, but they are not always a sign that you are in labor. You may have practice contractions called Braxton Hicks contractions. These false labor contractions are sometimes confused with true labor. What are [place] contractions? Braxton Hicks contractions are tightening movements that occur in the muscles of the uterus before labor. Unlike true labor contractions, these contractions do not result in opening (dilation) and thinning of the cervix. Toward the end of pregnancy (32-34 weeks), Braxton Hicks contractions can happen more often and may become stronger.  These contractions are sometimes difficult to tell apart from true labor because they can be very uncomfortable. You should not feel embarrassed if you go to the hospital with false labor. Sometimes, the only way to tell if you are in true labor is for your health care provider to look for changes in the cervix. The health care provider will do a physical exam and may monitor your contractions. If you are not in true labor, the exam should show that your cervix is not dilating and your water has not broken. If there are other health problems associated with your pregnancy, it is completely safe for you to be sent home with false labor. You may continue to have Braxton Hicks contractions until you go into true labor. How to tell the difference between true labor and false labor True labor  Contractions last 30-70 seconds.  Contractions become very regular.  Discomfort is usually felt in the top of the uterus, and it spreads to the lower abdomen and low back.  Contractions do not go away with walking.  Contractions usually become more intense and increase in frequency.  The cervix dilates and gets thinner. False labor  Contractions are usually shorter and not as strong as true labor contractions.  Contractions are usually irregular.  Contractions are often felt in the front of the lower abdomen and in the groin.  Contractions may go away when you walk around or change positions while lying down.  Contractions get weaker  and are shorter-lasting as time goes on.  The cervix usually does not dilate or become thin. Follow these instructions at home:  Take over-the-counter and prescription medicines only as told by your health care provider.  Keep up with your usual exercises and follow other instructions from your health care provider.  Eat and drink lightly if you think you are going into labor.  If Braxton Hicks contractions are making you uncomfortable: ? Change your position from  lying down or resting to walking, or change from walking to resting. ? Sit and rest in a tub of warm water. ? Drink enough fluid to keep your urine pale yellow. Dehydration may cause these contractions. ? Do slow and deep breathing several times an hour.  Keep all follow-up prenatal visits as told by your health care provider. This is important. Contact a health care provider if:  You have a fever.  You have continuous pain in your abdomen. Get help right away if:  Your contractions become stronger, more regular, and closer together.  You have fluid leaking or gushing from your vagina.  You pass blood-tinged mucus (bloody show).  You have bleeding from your vagina.  You have low back pain that you never had before.  You feel your baby's head pushing down and causing pelvic pressure.  Your baby is not moving inside you as much as it used to. Summary  Contractions that occur before labor are called Braxton Hicks contractions, false labor, or practice contractions.  Braxton Hicks contractions are usually shorter, weaker, farther apart, and less regular than true labor contractions. True labor contractions usually become progressively stronger and regular and they become more frequent.  Manage discomfort from [facility] contractions by changing position, resting in a warm bath, drinking plenty of water, or practicing deep breathing. This information is not intended to replace advice given to you by your health care provider. Make sure you discuss any questions you have with your health care provider. Document Released: 10/03/2016 Document Revised: 10/03/2016 Document Reviewed: 10/03/2016 Elsevier Interactive Patient Education  2018 [employer].

## 2018-03-02 NOTE — Progress Notes (Signed)
   LOW-RISK PREGNANCY VISIT Patient name: Charlotte Mccormick MRN [id number]  Date of birth: April 06, 1986 Chief Complaint:   Routine Prenatal Visit (NST/ back pain)  History of Present Illness:   Charlotte Mccormick is a 32 y.o. G53P2002 female at [redacted]w[redacted]d with an Estimated Date of Delivery: 03/21/18 being seen today for ongoing management of a low-risk pregnancy.  Today she reports back pain, decreased fm. Contractions: Irregular.  .  Movement: (!) Decreased. denies leaking of fluid. Review of Systems:   Pertinent items are noted in HPI Denies abnormal vaginal discharge w/ itching/odor/irritation, headaches, visual changes, shortness of breath, chest pain, abdominal pain, severe nausea/vomiting, or problems with urination or bowel movements unless otherwise stated above. Pertinent History Reviewed:  Reviewed past medical,surgical, social, obstetrical and family history.  Reviewed problem list, medications and allergies. Physical Assessment:   Vitals:   03/02/18 1402  BP: 113/73  Pulse: (!) 112  Weight: 171 lb (77.6 kg)  Body mass index is 31.28 kg/m.        Physical Examination:   General appearance: Well appearing, and in no distress  Mental status: Alert, oriented to person, place, and time  Skin: Warm & dry  Cardiovascular: Normal heart rate noted  Respiratory: Normal respiratory effort, no distress  Abdomen: Soft, gravid, nontender  Pelvic: Cervical exam performed  Dilation: 2 Effacement (%): Thick Station: -2  Extremities: Edema: None  Fetal Status: Fetal Heart Rate (bpm): 135 Fundal Height: 35 cm Movement: (!) Decreased Presentation: Vertex  NST: FHR baseline 135 bpm, Variability: moderate, Accelerations:present, Decelerations:  Absent= Cat 1/Reactive (initially non-reactive w/ mild decel w/ uc x 1, turned to semifowler's and beautifully reactive thereafter) Toco: irregular    Results for orders placed or performed in visit on 03/02/18 (from the past 24 hour(s))  POC  Urinalysis Dipstick OB   Collection Time: 03/02/18  2:06 PM  Result Value Ref Range   Color, UA     Clarity, UA     Glucose, UA Negative Negative   Bilirubin, UA     Ketones, UA neg    Spec Grav, UA     Blood, UA neg    pH, UA     POC Protein UA Trace Negative, Trace   Urobilinogen, UA     Nitrite, UA neg    Leukocytes, UA Negative Negative   Appearance     Odor      Assessment & Plan:  1) Low-risk pregnancy G3P2002 at [redacted]w[redacted]d with an Estimated Date of Delivery: 03/21/18   2) Persistent decreased fm, reactive NST   Meds: No orders of the defined types were placed in this encounter.  Labs/procedures today: nst, sve  Plan:  Continue routine obstetrical care   Reviewed: Term labor symptoms and general obstetric precautions including but not limited to vaginal bleeding, contractions, leaking of fluid and fetal movement were reviewed in detail with the patient.  All questions were answered  Follow-up: Return for As scheduled Thurs for nst/lrob.  Orders Placed This Encounter  Procedures  . POC Urinalysis Dipstick OB   [name] CNM, WHNP-BC 03/02/2018 4:00 PM

## 2018-03-03 LAB — GC/CHLAMYDIA PROBE AMP
CHLAMYDIA, DNA PROBE: NEGATIVE
Neisseria gonorrhoeae by PCR: NEGATIVE

## 2018-03-05 ENCOUNTER — Ambulatory Visit (INDEPENDENT_AMBULATORY_CARE_PROVIDER_SITE_OTHER): Payer: 59 | Admitting: *Deleted

## 2018-03-05 DIAGNOSIS — Z3483 Encounter for supervision of other normal pregnancy, third trimester: Secondary | ICD-10-CM

## 2018-03-05 NOTE — Progress Notes (Signed)
Tracing reviewed by Dr Elonda Husky.

## 2018-03-06 ENCOUNTER — Inpatient Hospital Stay (HOSPITAL_COMMUNITY)
Admission: AD | Admit: 2018-03-06 | Discharge: 2018-03-07 | Discharge status: Against Medical Advice | Payer: 59 | Source: Home / Self Care | Attending: Obstetrics & Gynecology | Admitting: Obstetrics & Gynecology

## 2018-03-06 DIAGNOSIS — Z3A38 38 weeks gestation of pregnancy: Secondary | ICD-10-CM | POA: Insufficient documentation

## 2018-03-06 DIAGNOSIS — O471 False labor at or after 37 completed weeks of gestation: Secondary | ICD-10-CM

## 2018-03-07 ENCOUNTER — Encounter (HOSPITAL_COMMUNITY): Payer: Self-pay | Admitting: Certified Registered Nurse Anesthetist

## 2018-03-07 ENCOUNTER — Inpatient Hospital Stay (HOSPITAL_COMMUNITY): Payer: 59 | Admitting: Anesthesiology

## 2018-03-07 ENCOUNTER — Encounter (HOSPITAL_COMMUNITY): Payer: Self-pay | Admitting: *Deleted

## 2018-03-07 ENCOUNTER — Inpatient Hospital Stay (HOSPITAL_COMMUNITY)
Admission: AD | Admit: 2018-03-07 | Discharge: 2018-03-09 | DRG: 806 | Disposition: A | Discharge status: Home / Self Care | Payer: 59 | Attending: Obstetrics & Gynecology | Admitting: Obstetrics & Gynecology

## 2018-03-07 ENCOUNTER — Encounter (HOSPITAL_COMMUNITY): Payer: Self-pay

## 2018-03-07 DIAGNOSIS — O9832 Other infections with a predominantly sexual mode of transmission complicating childbirth: Secondary | ICD-10-CM | POA: Diagnosis present

## 2018-03-07 DIAGNOSIS — D649 Anemia, unspecified: Secondary | ICD-10-CM | POA: Diagnosis present

## 2018-03-07 DIAGNOSIS — Z3483 Encounter for supervision of other normal pregnancy, third trimester: Secondary | ICD-10-CM | POA: Diagnosis present

## 2018-03-07 DIAGNOSIS — A6000 Herpesviral infection of urogenital system, unspecified: Secondary | ICD-10-CM | POA: Diagnosis present

## 2018-03-07 DIAGNOSIS — O99344 Other mental disorders complicating childbirth: Secondary | ICD-10-CM | POA: Diagnosis present

## 2018-03-07 DIAGNOSIS — O368130 Decreased fetal movements, third trimester, not applicable or unspecified: Secondary | ICD-10-CM | POA: Diagnosis present

## 2018-03-07 DIAGNOSIS — Z880 Allergy status to penicillin: Secondary | ICD-10-CM

## 2018-03-07 DIAGNOSIS — O9952 Diseases of the respiratory system complicating childbirth: Secondary | ICD-10-CM | POA: Diagnosis present

## 2018-03-07 DIAGNOSIS — F4310 Post-traumatic stress disorder, unspecified: Secondary | ICD-10-CM | POA: Diagnosis present

## 2018-03-07 DIAGNOSIS — O9902 Anemia complicating childbirth: Secondary | ICD-10-CM | POA: Diagnosis present

## 2018-03-07 DIAGNOSIS — Z3A38 38 weeks gestation of pregnancy: Secondary | ICD-10-CM

## 2018-03-07 DIAGNOSIS — F418 Other specified anxiety disorders: Secondary | ICD-10-CM | POA: Diagnosis present

## 2018-03-07 DIAGNOSIS — J45909 Unspecified asthma, uncomplicated: Secondary | ICD-10-CM | POA: Diagnosis present

## 2018-03-07 LAB — TYPE AND SCREEN
ABO/RH(D): B POS
Antibody Screen: NEGATIVE

## 2018-03-07 LAB — CBC
HEMATOCRIT: 30.8 % — AB (ref 36.0–46.0)
Hemoglobin: 9.2 g/dL — ABNORMAL LOW (ref 12.0–15.0)
MCH: 20 pg — ABNORMAL LOW (ref 26.0–34.0)
MCHC: 29.9 g/dL — ABNORMAL LOW (ref 30.0–36.0)
MCV: 67.1 fL — AB (ref 78.0–100.0)
Platelets: 191 10*3/uL (ref 150–400)
RBC: 4.59 MIL/uL (ref 3.87–5.11)
RDW: 17 % — ABNORMAL HIGH (ref 11.5–15.5)
WBC: 9.2 10*3/uL (ref 4.0–10.5)

## 2018-03-07 MED ORDER — WITCH HAZEL-GLYCERIN EX PADS: 1.0000 "application " | MEDICATED_PAD | CUTANEOUS | Status: DC | PRN

## 2018-03-07 MED ORDER — ZOLPIDEM TARTRATE 5 MG PO TABS: 5.0000 mg | ORAL_TABLET | Freq: Every evening | ORAL | Status: DC | PRN

## 2018-03-07 MED ORDER — LACTATED RINGERS IV SOLN: 500.0000 mL | Freq: Once | INTRAVENOUS | Status: DC

## 2018-03-07 MED ORDER — ONDANSETRON HCL 4 MG/2ML IJ SOLN: 4.0000 mg | INTRAMUSCULAR | Status: DC | PRN

## 2018-03-07 MED ORDER — OXYTOCIN BOLUS FROM INFUSION
500.0000 mL | Freq: Once | INTRAVENOUS | Status: AC
  Administered 2018-03-07: 500 mL via INTRAVENOUS

## 2018-03-07 MED ORDER — BENZOCAINE-MENTHOL 20-0.5 % EX AERO
1.0000 "application " | INHALATION_SPRAY | CUTANEOUS | Status: DC | PRN
  Administered 2018-03-09: 1 via TOPICAL
  Filled 2018-03-07: qty 56

## 2018-03-07 MED ORDER — ACETAMINOPHEN 325 MG PO TABS
650.0000 mg | ORAL_TABLET | ORAL | Status: DC | PRN
  Administered 2018-03-08 (×2): 650 mg via ORAL
  Filled 2018-03-07 (×2): qty 2

## 2018-03-07 MED ORDER — BUTORPHANOL TARTRATE 1 MG/ML IJ SOLN: 1.0000 mg | Freq: Once | INTRAMUSCULAR | Status: DC

## 2018-03-07 MED ORDER — LIDOCAINE HCL (PF) 1 % IJ SOLN
INTRAMUSCULAR | Status: DC | PRN
  Administered 2018-03-07 (×3): 5 mL via EPIDURAL

## 2018-03-07 MED ORDER — TETANUS-DIPHTH-ACELL PERTUSSIS 5-2.5-18.5 LF-MCG/0.5 IM SUSP: 0.5000 mL | Freq: Once | INTRAMUSCULAR | Status: DC

## 2018-03-07 MED ORDER — ACETAMINOPHEN 325 MG PO TABS: 650.0000 mg | ORAL_TABLET | ORAL | Status: DC | PRN

## 2018-03-07 MED ORDER — OXYCODONE-ACETAMINOPHEN 5-325 MG PO TABS: 2.0000 | ORAL_TABLET | ORAL | Status: DC | PRN

## 2018-03-07 MED ORDER — FENTANYL 2.5 MCG/ML BUPIVACAINE 1/10 % EPIDURAL INFUSION (WH - ANES)
14.0000 mL/h | INTRAMUSCULAR | Status: DC | PRN
  Administered 2018-03-07: 14 mL/h via EPIDURAL
  Filled 2018-03-07: qty 100

## 2018-03-07 MED ORDER — LACTATED RINGERS IV SOLN: 500.0000 mL | INTRAVENOUS | Status: DC | PRN

## 2018-03-07 MED ORDER — ONDANSETRON HCL 4 MG PO TABS
4.0000 mg | ORAL_TABLET | ORAL | Status: DC | PRN
  Administered 2018-03-08: 4 mg via ORAL
  Filled 2018-03-07: qty 1

## 2018-03-07 MED ORDER — EPHEDRINE 5 MG/ML INJ
10.0000 mg | INTRAVENOUS | Status: DC | PRN
  Filled 2018-03-07: qty 2

## 2018-03-07 MED ORDER — LIDOCAINE HCL (PF) 1 % IJ SOLN
30.0000 mL | INTRAMUSCULAR | Status: DC | PRN
  Filled 2018-03-07: qty 30

## 2018-03-07 MED ORDER — FLEET ENEMA 7-19 GM/118ML RE ENEM: 1.0000 | ENEMA | RECTAL | Status: DC | PRN

## 2018-03-07 MED ORDER — PHENYLEPHRINE 40 MCG/ML (10ML) SYRINGE FOR IV PUSH (FOR BLOOD PRESSURE SUPPORT)
80.0000 ug | PREFILLED_SYRINGE | INTRAVENOUS | Status: DC | PRN
  Filled 2018-03-07: qty 5

## 2018-03-07 MED ORDER — PROMETHAZINE HCL 25 MG/ML IJ SOLN: 25.0000 mg | Freq: Once | INTRAMUSCULAR | Status: DC

## 2018-03-07 MED ORDER — OXYTOCIN 40 UNITS IN LACTATED RINGERS INFUSION - SIMPLE MED
2.5000 [IU]/h | INTRAVENOUS | Status: DC
  Filled 2018-03-07: qty 1000

## 2018-03-07 MED ORDER — PHENYLEPHRINE 40 MCG/ML (10ML) SYRINGE FOR IV PUSH (FOR BLOOD PRESSURE SUPPORT)
80.0000 ug | PREFILLED_SYRINGE | INTRAVENOUS | Status: DC | PRN
  Filled 2018-03-07: qty 5
  Filled 2018-03-07: qty 10

## 2018-03-07 MED ORDER — SENNOSIDES-DOCUSATE SODIUM 8.6-50 MG PO TABS
2.0000 | ORAL_TABLET | ORAL | Status: DC
  Administered 2018-03-07 – 2018-03-08 (×2): 2 via ORAL
  Filled 2018-03-07 (×2): qty 2

## 2018-03-07 MED ORDER — COCONUT OIL OIL: 1.0000 "application " | TOPICAL_OIL | Status: DC | PRN

## 2018-03-07 MED ORDER — ONDANSETRON HCL 4 MG/2ML IJ SOLN
4.0000 mg | Freq: Four times a day (QID) | INTRAMUSCULAR | Status: DC | PRN
  Administered 2018-03-07: 4 mg via INTRAVENOUS
  Filled 2018-03-07: qty 2

## 2018-03-07 MED ORDER — IBUPROFEN 600 MG PO TABS
600.0000 mg | ORAL_TABLET | Freq: Four times a day (QID) | ORAL | Status: DC
  Administered 2018-03-07 – 2018-03-09 (×8): 600 mg via ORAL
  Filled 2018-03-07 (×8): qty 1

## 2018-03-07 MED ORDER — MEASLES, MUMPS & RUBELLA VAC ~~LOC~~ INJ
0.5000 mL | INJECTION | Freq: Once | SUBCUTANEOUS | Status: DC
  Filled 2018-03-07: qty 0.5

## 2018-03-07 MED ORDER — PRENATAL MULTIVITAMIN CH
1.0000 | ORAL_TABLET | Freq: Every day | ORAL | Status: DC
  Administered 2018-03-08 – 2018-03-09 (×2): 1 via ORAL
  Filled 2018-03-07 (×2): qty 1

## 2018-03-07 MED ORDER — DIPHENHYDRAMINE HCL 50 MG/ML IJ SOLN: 12.5000 mg | INTRAMUSCULAR | Status: DC | PRN

## 2018-03-07 MED ORDER — OXYCODONE-ACETAMINOPHEN 5-325 MG PO TABS: 1.0000 | ORAL_TABLET | ORAL | Status: DC | PRN

## 2018-03-07 MED ORDER — DIPHENHYDRAMINE HCL 25 MG PO CAPS: 25.0000 mg | ORAL_CAPSULE | Freq: Four times a day (QID) | ORAL | Status: DC | PRN

## 2018-03-07 MED ORDER — DIBUCAINE 1 % RE OINT: 1.0000 "application " | TOPICAL_OINTMENT | RECTAL | Status: DC | PRN

## 2018-03-07 MED ORDER — SIMETHICONE 80 MG PO CHEW
80.0000 mg | CHEWABLE_TABLET | ORAL | Status: DC | PRN
  Administered 2018-03-08 (×2): 80 mg via ORAL
  Filled 2018-03-07 (×2): qty 1

## 2018-03-07 MED ORDER — SOD CITRATE-CITRIC ACID 500-334 MG/5ML PO SOLN: 30.0000 mL | ORAL | Status: DC | PRN

## 2018-03-07 MED ORDER — LACTATED RINGERS IV SOLN
INTRAVENOUS | Status: DC
  Administered 2018-03-07 (×2): via INTRAVENOUS

## 2018-03-07 NOTE — MAU Note (Signed)
Painful ctx for serval hours thinks her water might have broken had gush of fluid and has wetness now .  Good fetal movement felt

## 2018-03-07 NOTE — MAU Note (Signed)
S.Weinhold, CNM at bedside to discuss pain management and Labor criteria. Pt not receptive to plan. Wants to research side effects of stadol.

## 2018-03-07 NOTE — MAU Note (Signed)
Pt has removed her FHR monitor. (still tracing maternal HR) and stated "the midwife said the baby was ok and beside you are not doing a D-- thing for me.Send my records to [facility].  Does not want to take  Stadol due to"too many side effects". Will inform midwife of patient decision.

## 2018-03-07 NOTE — Anesthesia Preprocedure Evaluation (Signed)
Anesthesia Evaluation  Patient identified by MRN, date of birth, ID band Patient awake    Reviewed: Allergy & Precautions, H&P , NPO status , Patient's Chart, lab work & pertinent test results  History of Anesthesia Complications Negative for: history of anesthetic complications  Airway Mallampati: II  TM Distance: >3 FB Neck ROM: full    Dental no notable dental hx. (+) Teeth Intact   Pulmonary asthma ,    Pulmonary exam normal breath sounds clear to auscultation       Cardiovascular negative cardio ROS Normal cardiovascular exam Rhythm:regular Rate:Normal     Neuro/Psych PSYCHIATRIC DISORDERS Anxiety Depression negative neurological ROS     GI/Hepatic Neg liver ROS, GERD  ,IBS (irritable bowel syndrome)   Endo/Other  negative endocrine ROS  Renal/GU negative Renal ROS  negative genitourinary   Musculoskeletal   Abdominal   Peds  Hematology  (+) anemia ,   Anesthesia Other Findings   Reproductive/Obstetrics (+) Pregnancy                             Anesthesia Physical Anesthesia Plan  ASA: III  Anesthesia Plan: Epidural   Post-op Pain Management:    Induction:   PONV Risk Score and Plan:   Airway Management Planned:   Additional Equipment:   Intra-op Plan:   Post-operative Plan:   Informed Consent: I have reviewed the patients History and Physical, chart, labs and discussed the procedure including the risks, benefits and alternatives for the proposed anesthesia with the patient or authorized representative who has indicated his/her understanding and acceptance.     Plan Discussed with:   Anesthesia Plan Comments:         Anesthesia Quick Evaluation

## 2018-03-07 NOTE — Anesthesia Procedure Notes (Signed)
Epidural Patient location during procedure: OB Start time: 03/07/2018 12:50 PM End time: 03/07/2018 1:00 PM  Staffing Anesthesiologist: Murvin Natal, MD Performed: anesthesiologist   Preanesthetic Checklist Completed: patient identified, site marked, pre-op evaluation, timeout performed, IV checked, risks and benefits discussed and monitors and equipment checked  Epidural Patient position: sitting Prep: DuraPrep Patient monitoring: heart rate, cardiac monitor, continuous pulse ox and blood pressure Approach: midline Location: L4-L5 Injection technique: LOR air  Needle:  Needle type: Tuohy  Needle gauge: 17 G Needle length: 9 cm Needle insertion depth: 5 cm Catheter type: closed end flexible Catheter size: 19 Gauge Catheter at skin depth: 10 cm Test dose: negative and Other  Assessment Events: blood not aspirated, injection not painful, no injection resistance and negative IV test  Additional Notes Informed consent obtained prior to proceeding including risk of failure, 1% risk of PDPH, risk of minor discomfort and bruising. Discussed alternatives to epidural analgesia and patient desires to proceed.  Timeout performed pre-procedure verifying patient name, procedure, and platelet count.  Patient tolerated procedure well. Reason for block:procedure for pain

## 2018-03-07 NOTE — MAU Provider Note (Signed)
S: [name] is a 32 y.o. G3P2002 at [redacted]w[redacted]d  who presents to MAU today for labor evaluation.     Cervical exam by RN:   Initially 3/70/-2 on arrival.  Dilation: 5 Effacement (%): 70 Cervical Position: Middle Station: -2 Presentation: Vertex Exam by:: n druebbisch rn  Fetal Monitoring: Baseline: 125 Variability: moderate Accelerations: present Decelerations: none Contractions: every 2-3 minutes   Initially, FHR not found by RN so I was called to room for bedside US.  Imaging: Limited OB US  FHR noted in 140s, EFM placed by RN and FHR tracing established.  Vertex position noted.  Pt informed that the ultrasound is considered a limited OB ultrasound and is not intended to be a complete ultrasound exam.  Patient also informed that the ultrasound is not being completed with the intent of assessing for fetal or placental anomalies or any pelvic abnormalities.  Explained that the purpose of today's ultrasound is to assess for  presentation and viability.  Patient acknowledges the purpose of the exam and the limitations of the study.    MDM Discussed patient with RN. NST reviewed. See notes about bedside US. Cervix changed from 3-5 cm while pt in MAU. Admit to [facility] for active labor.  A: SIUP at [redacted]w[redacted]d  Active labor at term  P: Admit to BS Anticipate NSVD  Charlotte Mccormick, CNM 03/07/2018 10:40 AM

## 2018-03-07 NOTE — Anesthesia Pain Management Evaluation Note (Signed)
  CRNA Pain Management Visit Note  Patient: Charlotte Mccormick, 32 y.o., female  "Hello I am a member of the anesthesia team at [facility]. We have an anesthesia team available at all times to provide care throughout the hospital, including epidural management and anesthesia for C-section. I don't know your plan for the delivery whether it a natural birth, water birth, IV sedation, nitrous supplementation, doula or epidural, but we want to meet your pain goals."   1.Was your pain managed to your expectations on prior hospitalizations?   Yes   2.What is your expectation for pain management during this hospitalization?     Epidural  3.How can we help you reach that goal? unsure  Record the patient's initial score and the patient's pain goal.   Pain: 10  Pain Goal: 10 [facility] wants you to be able to say your pain was always managed very well.  Casimer Lanius 03/07/2018

## 2018-03-07 NOTE — MAU Provider Note (Addendum)
None      S: [name] is a 32 y.o. G3P2002 at [redacted]w[redacted]d  who presents to MAU today complaining contractions q 5 minutes all evening. She denies vaginal bleeding. She endorses LOF. She reports normal fetal movement.  Patient is very upset, explains she has child care reserved tonight for her two other children and is worried she won't have child care for them when she goes into labor.  O: BP 102/61   Pulse (!) 105   Temp 98 F (36.7 C)   Resp 18   Ht 5\' 2"  (1.575 m)   Wt 76.7 kg   LMP 06/14/2017   BMI 30.91 kg/m  GENERAL: Well-developed, well-nourished female in no acute distress.  HEAD: Normocephalic, atraumatic.  CHEST: Normal effort of breathing, regular heart rate ABDOMEN: Soft, nontender, gravid  Cervical exam:  Dilation: 3 Effacement (%): 50 Cervical Position: Posterior Station: -2 Presentation: Vertex Exam by:: K.Wilson,RN   Fetal Monitoring: Category 1 Baseline: 120 Variability: Moderate Accelerations: 15 x 15 Decelerations: None Contractions: Irregular, palpate mild to moderate  A: SIUP at [redacted]w[redacted]d  False labor No cervical change after one hour of ambulation  P: CNM at bedside to discuss authentic labor vs [place], observe patient patient's coping through ctx Patient offered IM analgesia, declined Patiet offered to walk for an additional hour, declined  Patient signed out [place] during evaluation  Darlina Rumpf, CNM 03/07/2018 3:24 AM

## 2018-03-07 NOTE — MAU Note (Signed)
Pt reports UC's since last night, unable to sleep  No LOF, no bleeding  Has felt some movement but unsure about amount  Also nausea since last night

## 2018-03-07 NOTE — MAU Note (Signed)
Notified Charlotte Mccormick pt wishes to leave and not have pain medication. Ok to discharge from Labor standpoint. Need to have FHR tracing confirmed due to maternal HR on last 15 min of strip.  Explained to pt that we would like to monitor baby for 10-15 more if possible before she was discharge. Pt refused because she said she was told the NST was fine and felt she did not need any more monitoring since we were sending her home.Stated she still wanted records sent to [facility]. Explained to pt since she refused to have baby monitored again she would have to sign out AMA. Pt signed form and left.

## 2018-03-07 NOTE — H&P (Addendum)
LABOR AND DELIVERY ADMISSION HISTORY AND PHYSICAL NOTE  Charlotte Mccormick is a 32 y.o. female G33P2002 with IUP at [redacted]w[redacted]d by LMP c/w 7 week U/S presenting in spontaneous labor. She reports positive fetal movement. She denies leakage of fluid. Does report vaginal bleeding for the past day.   Prenatal History/Complications: PNC at [facility].  Pregnancy complications: Depression with anxiety on medication and PTSD. Decreased fetal movement, reactive NSTs with BPP 6/10 on 9/23.  Past Medical History: Past Medical History:  Diagnosis Date  . Abnormal uterine bleeding (AUB) 12/12/2015  . Anemia   . Anxiety   . Asthma   . Constipation   . Depression   . Fibroid   . Genital herpes   . GERD (gastroesophageal reflux disease)   . History of chlamydia   . Hyperemesis   . IBS (irritable bowel syndrome)   . Insomnia   . Mental disorder    PTSD, ANXIETY,Depression  . PTSD (post-traumatic stress disorder)   . Sinusitis   . Vaginal dryness 12/12/2015    Past Surgical History: Past Surgical History:  Procedure Laterality Date  . COLONOSCOPY     2016  . COLONOSCOPY N/A 04/08/2016   Procedure: COLONOSCOPY;  Surgeon: Danie Binder, MD;  Location: [facility];  Service: Endoscopy;  Laterality: N/A;  10:15 AM  . ESOPHAGOGASTRODUODENOSCOPY N/A 04/08/2016   Procedure: ESOPHAGOGASTRODUODENOSCOPY (EGD);  Surgeon: Danie Binder, MD;  Location: [facility]O [facility];  Service: Endoscopy;  Laterality: N/A;  . PERINEUM REPAIR    . UPPER GASTROINTESTINAL ENDOSCOPY  2016    Obstetrical History: OB History    Gravida  3   Para  2   Term  2   Preterm  0   AB  0   Living  2     SAB  0   TAB  0   Ectopic  0   Multiple      Live Births  2           Social History: Social History   Socioeconomic History  . Marital status: Married    Spouse name: Not on file  . Number of children: Not on file  . Years of education: Not on file  . Highest education level: Not on file   Occupational History  . Not on file  Social Needs  . Financial resource strain: Not on file  . Food insecurity:    Worry: Not on file    Inability: Not on file  . Transportation needs:    Medical: Not on file    Non-medical: Not on file  Tobacco Use  . Smoking status: Never Smoker  . Smokeless tobacco: Never Used  Substance and Sexual Activity  . Alcohol use: Yes    Frequency: Never    Comment: occ wine  . Drug use: No  . Sexual activity: Yes    Birth control/protection: None  Lifestyle  . Physical activity:    Days per week: Not on file    Minutes per session: Not on file  . Stress: Not on file  Relationships  . Social connections:    Talks on phone: Not on file    Gets together: Not on file    Attends religious service: Not on file    Active member of club or organization: Not on file    Attends meetings of clubs or organizations: Not on file    Relationship status: Not on file  Other Topics Concern  . Not on file  Social History Narrative   ** Merged History Encounter **        Family History: Family History  Problem Relation Age of Onset  . Heart disease Mother   . Hypertension Mother   . Hypertension Father   . Hyperlipidemia Father   . Diabetes Father   . Cancer Father   . Epilepsy Brother   . Eczema Daughter   . Colon cancer Neg Hx     Allergies: Allergies  Allergen Reactions  . Bee Venom Swelling and Other (See Comments)    Reaction:  Localized swelling   . Penicillins Itching and Other (See Comments)    Has patient had a PCN reaction causing immediate rash, facial/tongue/throat swelling, SOB or lightheadedness with hypotension: No Has patient had a PCN reaction causing severe rash involving mucus membranes or skin necrosis: No Has patient had a PCN reaction that required hospitalization No Has patient had a PCN reaction occurring within the last 10 years: Yes If all of the above answers are "NO", then may proceed with Cephalosporin use.   .  Reglan [Metoclopramide] Anxiety    Medications Prior to Admission  Medication Sig Dispense Refill Last Dose  . acetaminophen (TYLENOL) 500 MG tablet Take 1,000 mg by mouth as needed.   Not Taking  . acyclovir (ZOVIRAX) 400 MG tablet Take 1 tablet (400 mg total) by mouth 3 (three) times daily. 90 tablet 2 Taking  . albuterol (PROVENTIL HFA;VENTOLIN HFA) 108 (90 Base) MCG/ACT inhaler INHALE 2 PUFFS BY MOUTH EVERY 6 HOURS AS NEEDED FOR WHEEZING OR SHORTNESS OF BREATH 8.5 g 3 Taking  . buPROPion (WELLBUTRIN SR) 200 MG 12 hr tablet Take 1 tablet (200 mg total) by mouth 2 (two) times daily. 60 tablet 6 Taking  . Calcium Carbonate Antacid (TUMS PO) Take 4 tablets by mouth as needed (heartburn).    Taking  . cyclobenzaprine (FLEXERIL) 10 MG tablet Take 1 tablet (10 mg total) by mouth 3 (three) times daily as needed for muscle spasms. 30 tablet 1 Taking  . diphenhydrAMINE (BENADRYL) 25 MG tablet Take 25 mg by mouth as needed for sleep.   Taking  . ondansetron (ZOFRAN ODT) 4 MG disintegrating tablet Take 1 tablet (4 mg total) by mouth every 6 (six) hours as needed for nausea. 20 tablet 1 Taking  . sertraline (ZOLOFT) 50 MG tablet Take 1 tablet (50 mg total) by mouth daily. 30 tablet 11 Taking     Review of Systems  All systems reviewed and negative except as stated in HPI  Physical Exam Blood pressure (!) 123/55, pulse (!) 103, temperature 98.2 F (36.8 C), temperature source Oral, resp. rate 19, last menstrual period 06/14/2017, not currently breastfeeding. General appearance: alert, oriented, NAD Lungs: normal respiratory effort Heart: regular rate Abdomen: soft, non-tender; gravid, FH appropriate for GA Extremities: No calf swelling or tenderness Presentation: cephalic by RN exam  Fetal monitoring: Baseline 120's, moderate var, + accel, - decel  Uterine activity: Every 2-4 minutes  Dilation: 5 Effacement (%): 70 Station: -2 Exam by:: n druebbisch rn  Prenatal labs: ABO, Rh: --/--/B POS  (09/18 1622) Antibody: NEG (09/18 1622) Rubella: 7.71 (03/20 1224) RPR: Non Reactive (07/29 0933)  HBsAg: Negative (03/20 1224)  HIV: Non Reactive (07/29 0933)  GC/Chlamydia: Negative  GBS:   Negative  2-hr GTT:  Normal  Genetic screening:  Declined  Anatomy US: Normal 9/23  Prenatal Transfer Tool  Maternal Diabetes: No Genetic Screening: Declined Maternal Ultrasounds/Referrals: Normal Fetal Ultrasounds or other Referrals:  None  Maternal Substance Abuse:  No Significant Maternal Medications:  Meds include: Zoloft Significant Maternal Lab Results: None  No results found for this or any previous visit (from the past 24 hour(s)).  Patient Active Problem List   Diagnosis Date Noted  . Supervision of normal pregnancy 08/20/2017  . Depression with anxiety 01/23/2017  . PTSD (post-traumatic stress disorder)  05/01/2016    Assessment: Rifka Ramey is a 32 y.o. G3P2002 at [redacted]w[redacted]d here in spontaneous labor. Pregnancy complicated by depression with anxiety on Zoloft/Wellbutrin, PTSD, decreased fetal movement with reactive NSTs with BPP 6/10 on 9/23. GBS negative.   #Labor: SOL, expectant management.  #Pain: Desires epidural.  #FWB: Cat 1 strip  #ID: GBS negative #MOF: Bottle feeding  #MOC: Vasectomy  #Circ: requesting outpatient circ   Patriciaann Clan 03/07/2018, 10:57 AM   OB FELLOW HISTORY AND PHYSICAL ATTESTATION  I have seen and examined this patient; I agree with above documentation in the resident's note.   Phill Myron, D.O. OB Fellow  03/07/2018, 4:03 PM

## 2018-03-08 LAB — RPR: RPR Ser Ql: NONREACTIVE

## 2018-03-08 MED ORDER — BUPROPION HCL ER (SR) 100 MG PO TB12
200.0000 mg | ORAL_TABLET | Freq: Two times a day (BID) | ORAL | Status: DC
  Administered 2018-03-08 – 2018-03-09 (×3): 200 mg via ORAL
  Filled 2018-03-08 (×6): qty 2

## 2018-03-08 MED ORDER — SERTRALINE HCL 50 MG PO TABS
50.0000 mg | ORAL_TABLET | Freq: Every day | ORAL | Status: DC
  Administered 2018-03-08 – 2018-03-09 (×2): 50 mg via ORAL
  Filled 2018-03-08 (×3): qty 1

## 2018-03-08 NOTE — Progress Notes (Signed)
CSW attempted to meet with [name] at bedside to complete assessment, however [name] was asleep and had baby on her chest. FOB was present and stated "she's sleep." CSW attempted to awake [name], [name] drowsy and did not open her eyes to CSW's questions. CSW to return at a later time for assessment.  Madilyn Fireman, MSW, [place] Social Worker [facility] [phone]

## 2018-03-08 NOTE — Progress Notes (Signed)
Patient reports headache of 7 out of ten with nausea. Patient states she hasn't eaten very well because she doesn't have "much of an Appetite" and that Wellbutrin does sometimes cause nausea. Patient refused offer of tylenol with crackers.

## 2018-03-08 NOTE — Progress Notes (Signed)
CSW received consult for Charlotte Mccormick due to her history of anxiety, depression, PTSD, and EPDS score of 19. CSW met with Charlotte Mccormick and [name] at bedside to complete assessment. CSW and Charlotte Mccormick discussed history of anxiety and depression, Charlotte Mccormick reports having both for many years now. Charlotte Mccormick reports that she was previously on 219m twice daily of Wellbutrin and 574mof Zoloft [facility] Charlotte Mccormick reports that she received her Zoloft today but no Wellbutrin since Thursday. Charlotte Mccormick expressed strong desires to take her Wellbutrin today. CSW spoke with MaLelan PonsCNM to advocate on Charlotte Mccormick's behalf. Orders placed for Charlotte Mccormick to begin receiving her Wellbutrin as previously prescribed.  Charlotte Mccormick reports receiving WIC and Food Stamps. Charlotte Mccormick reports being in the process to receive SSI, and her next steps are to have a court hearing with a judge. Charlotte Mccormick reports that she receives long term disability from a previous employer. Charlotte Mccormick reports that her husband Charlotte Mccormick employed full time at the post office. Charlotte Mccormick reports that her two older children are in the care of her friend, Charlotte Moodt this time. Charlotte Mccormick reports that her two oldest children are enrolled in a program [facility]. Charlotte Mccormick reports she receives support and guidance from the case worker that visits the home on a bi weekly basis. Charlotte Mccormick reports that she [facility] reports she receives visits from her therapist Charlotte Mooreswice a week. Charlotte Mccormick reports feeling optimistic about [facility] due to them offering "real solutions." [facility]   Charlotte Mccormick and CSW discussed bonding with Charlotte. Whenever CSW asked Charlotte Mccormick about how bonding was going, she immediately stated "not good." CSW asked Charlotte Mccormick to elaborate on what's not good, and she stated that she doesn't feel "lovey dovey" with this baby as she did with her first two. Charlotte Mccormick attributes the  lack of a strong bond to the baby for the reason of her husband not being as excited for this pregnancy and parents being conflicted on their gender preference. Charlotte Mccormick states she often gets overwhelmed and does not feel confident in herself to express her feelings and emotions so she keeps them inside. CSW encouraged Charlotte Mccormick to express herself in an appropriate way, to not be afraid to ask for assistance, and to protect her own well being. Charlotte Mccormick reports she enjoys journaling her thoughts and feelings. Charlotte Mccormick reports that this baby is "so small" compared to her others. Charlotte Mccormick was encouraged to reach out for assistance if any need or concerns arise. CSW offered Charlotte Mccormick a [place]erral, but she denied do to having support from other programs. CSW educated Charlotte Mccormick on [employer] and how to access that resource. Charlotte Mccormick and CSW discussed ways to positively contribute to the mother and baby bond. CSW encouraged Charlotte Mccormick to do skin to skin with infant, Charlotte Mccormick agreed to do so. Charlotte Mccormick states her husband and mother in law are her biggest people she has for support. Charlotte Mccormick stated "even though this is my third baby, I don't know everything." Charlotte Mccormick reports that her cousin passed away in a car accident this week and that she missed the funeral [facility] offered brief supportive counseling to Charlotte Mccormick. CSW informed Charlotte Mccormick of Chaplain services available if she desired them.   CSW did not identify any specific barriers to discharge during assessment. CSW is requesting thorough education for all teaching  with  this Charlotte Mccormick due to her mental health concerns. Offer continued support and please submit additional consult for CSW if necessary.  Charlotte Mccormick, MSW, [place] Social Worker [facility] [phone]

## 2018-03-08 NOTE — Anesthesia Postprocedure Evaluation (Signed)
Anesthesia Post Note  Patient: Research officer, political party  Procedure(s) Performed: AN AD HOC LABOR EPIDURAL     Patient location during evaluation: Mother Baby Anesthesia Type: Epidural Level of consciousness: awake Pain management: satisfactory to patient Vital Signs Assessment: post-procedure vital signs reviewed and stable Respiratory status: spontaneous breathing Cardiovascular status: stable Anesthetic complications: no    Last Vitals:  Vitals:   03/08/18 0645 03/08/18 0800  BP: (!) 95/55 115/85  Pulse: 62 76  Resp: 16 18  Temp: 36.7 C 36.7 C  SpO2: 100% 100%    Last Pain:  Vitals:   03/08/18 0800  TempSrc: Oral  PainSc: 0-No pain   Pain Goal:                 [name]

## 2018-03-08 NOTE — Plan of Care (Signed)
Patient expenses concern for caring for baby

## 2018-03-08 NOTE — Progress Notes (Addendum)
POSTPARTUM PROGRESS NOTE  Post Partum Day 1  Subjective:  Charlotte Mccormick is a 32 y.o. [id number] s/p SVD at [redacted]w[redacted]d.  She reports she is doing well. No acute events overnight. She denies any problems with ambulating, voiding or po intake. Denies nausea or vomiting.  Pain is well controlled.  Lochia is mild. Per RN patient is not doing well "psychologically". Concerns for bonding with baby. Patient continues to cry throughout the day.   Objective: Blood pressure 115/85, pulse 76, temperature 98 F (36.7 C), temperature source Oral, resp. rate 18, last menstrual period 06/14/2017, SpO2 100 %, unknown if currently breastfeeding.  Physical Exam:  General: alert, cooperative and no distress Chest: no respiratory distress Heart:regular rate, distal pulses intact Abdomen: soft, nontender,  Uterine Fundus: firm, appropriately tender DVT Evaluation: No calf swelling or tenderness Extremities: no edema Skin: warm, dry  Recent Labs    03/07/18 1110  HGB 9.2*  HCT 30.8*    Assessment/Plan: Charlotte Mccormick is a 32 y.o. [id number] s/p SVD at [redacted]w[redacted]d   PPD#1 - Doing well  Routine postpartum care  Contraception: vasectomy  Feeding: bottle  Depression: will restart Zoloft 50mg  (home med), if worsening depression can consider restarting wellbutrin 200mg  bid (home med). CSW consult  Dispo: Plan for discharge pending CSW consult.   LOS: 1 day   Caroline More, DO PGY-2 03/08/2018, 9:25 AM   OB FELLOW POSTPARTUM PROGRESS NOTE ATTESTATION  I have seen and examined this patient and agree with above documentation in the resident's note.   Phill Myron, D.O. OB Fellow  03/08/2018, 9:30 AM

## 2018-03-08 NOTE — Progress Notes (Signed)
Patient ID: Charlotte Mccormick, female   DOB: Aug 05, 1985, 32 y.o.   MRN: [id number] Patient requesting to be put back on original antidepressant meds Reordered Zoloft and Wellbutrin SR as previously dosed Hansel Feinstein CNM

## 2018-03-09 ENCOUNTER — Other Ambulatory Visit: Payer: Self-pay | Admitting: Obstetrics & Gynecology

## 2018-03-09 MED ORDER — IBUPROFEN 600 MG PO TABS: 600.0000 mg | ORAL_TABLET | Freq: Four times a day (QID) | ORAL | 0 refills | Status: DC

## 2018-03-09 NOTE — Discharge Instructions (Signed)

## 2018-03-09 NOTE — Discharge Summary (Addendum)
Postpartum Discharge Summary     Patient Name: Charlotte Mccormick DOB: [birth year] MRN: [id number]  Date of admission: 03/07/2018 Delivering Provider: Nicolette Bang   Date of discharge: 03/09/2018  Admitting diagnosis: 38 WKS CTX 4 MINS Intrauterine pregnancy: [redacted]w[redacted]d     Secondary diagnosis:  Active Problems:   Labor and delivery, indication for care  Additional problems: Depression with anxiety on medication and PTSD. Decreased fetal movement, reactive NSTs with BPP 6/10 on 9/23.     Discharge diagnosis: Term Pregnancy Delivered                                                                                                Post partum procedures:none  Augmentation: none  Complications: None  Hospital course:  Onset of Labor With Vaginal Delivery     32 y.o. yo G3P3003 at [redacted]w[redacted]d was admitted in Active Labor on 03/07/2018. Patient had an uncomplicated labor course as follows:  Membrane Rupture Time/Date: 2:54 PM ,03/07/2018   Intrapartum Procedures: Episiotomy: None [1]                                         Lacerations:  1st degree [2]  Patient had a delivery of a Viable infant. 03/07/2018  Information for the patient's newborn:  Fidencia, Mccloud [[id number]]  Delivery Method: Vaginal, Spontaneous(Filed from Delivery Summary)    Pateint had an uncomplicated postpartum course.  She is ambulating, tolerating a regular diet, passing flatus, and urinating well. Patient is discharged home in stable condition on 03/09/18.   Magnesium Sulfate recieved: No BMZ received: No  Physical exam  Vitals:   03/08/18 0800 03/08/18 1454 03/08/18 2100 03/09/18 0505  BP: 115/85 112/75 122/85 125/81  Pulse: 76 86 (!) 102 80  Resp: 18  18 16   Temp: 98 F (36.7 C) 98.3 F (36.8 C) 98.5 F (36.9 C) 98.7 F (37.1 C)  TempSrc: Oral Oral Oral Oral  SpO2: 100% 100%  100%   General: alert, cooperative and no distress Lochia: appropriate Uterine Fundus: firm Incision:  N/A DVT Evaluation: No evidence of DVT seen on physical exam. No cords or calf tenderness. No significant calf/ankle edema. Labs: Lab Results  Component Value Date   WBC 9.2 03/07/2018   HGB 9.2 (L) 03/07/2018   HCT 30.8 (L) 03/07/2018   MCV 67.1 (L) 03/07/2018   PLT 191 03/07/2018   CMP Latest Ref Rng & Units 02/18/2018  Glucose 70 - 99 mg/dL 68(L)  BUN 6 - 20 mg/dL <5(L)  Creatinine 0.44 - 1.00 mg/dL 0.54  Sodium 135 - 145 mmol/L 136  Potassium 3.5 - 5.1 mmol/L 3.7  Chloride 98 - 111 mmol/L 105  CO2 22 - 32 mmol/L 20(L)  Calcium 8.9 - 10.3 mg/dL 9.2  Total Protein 6.5 - 8.1 g/dL 7.7  Total Bilirubin 0.3 - 1.2 mg/dL 0.5  Alkaline Phos 38 - 126 U/L 93  AST 15 - 41 U/L 19  ALT 0 - 44 U/L 11    Discharge instruction: per  After Visit Summary and "Baby and Me Booklet".  After visit meds:  Allergies as of 03/09/2018      Reactions   Bee Venom Swelling, Other (See Comments)   Reaction:  Localized swelling    Penicillins Itching, Other (See Comments)   Has patient had a PCN reaction causing immediate rash, facial/tongue/throat swelling, SOB or lightheadedness with hypotension: No Has patient had a PCN reaction causing severe rash involving mucus membranes or skin necrosis: No Has patient had a PCN reaction that required hospitalization No Has patient had a PCN reaction occurring within the last 10 years: Yes If all of the above answers are "NO", then may proceed with Cephalosporin use.   Reglan [metoclopramide] Anxiety      Medication List    TAKE these medications   acetaminophen 500 MG tablet Commonly known as:  TYLENOL Take 1,000 mg by mouth every 6 (six) hours as needed for moderate pain.   acyclovir 400 MG tablet Commonly known as:  ZOVIRAX Take 1 tablet (400 mg total) by mouth 3 (three) times daily.   albuterol 108 (90 Base) MCG/ACT inhaler Commonly known as:  PROVENTIL HFA;VENTOLIN HFA INHALE 2 PUFFS BY MOUTH EVERY 6 HOURS AS NEEDED FOR WHEEZING OR SHORTNESS OF  BREATH   buPROPion 200 MG 12 hr tablet Commonly known as:  WELLBUTRIN SR Take 1 tablet (200 mg total) by mouth 2 (two) times daily.   cyclobenzaprine 10 MG tablet Commonly known as:  FLEXERIL Take 1 tablet (10 mg total) by mouth 3 (three) times daily as needed for muscle spasms.   diphenhydrAMINE 25 MG tablet Commonly known as:  BENADRYL Take 25 mg by mouth as needed for sleep.   ibuprofen 600 MG tablet Commonly known as:  ADVIL,MOTRIN Take 1 tablet (600 mg total) by mouth every 6 (six) hours.   ondansetron 4 MG disintegrating tablet Commonly known as:  ZOFRAN-ODT Take 1 tablet (4 mg total) by mouth every 6 (six) hours as needed for nausea.   pantoprazole 20 MG tablet Commonly known as:  PROTONIX Take 20 mg by mouth daily.   sertraline 50 MG tablet Commonly known as:  ZOLOFT Take 1 tablet (50 mg total) by mouth daily.       Diet: routine diet  Activity: Advance as tolerated. Pelvic rest for 6 weeks.   Outpatient follow up:4 weeks Follow up Appt: No future appointments. Follow up Visit:No follow-ups on file.   Please schedule this patient for Postpartum visit in: 4 weeks with the following provider: MD For C/S patients schedule nurse incision check in weeks 2 weeks: no Low risk pregnancy complicated by: depression/anxiety Delivery mode:  SVD Anticipated Birth Control:  vasectomy PP Procedures needed: none  Schedule Integrated BH visit: yes      Newborn Data: Live born female  Birth Weight: 6 lb 2.2 oz (2785 g) APGAR: 9, 9  Newborn Delivery   Birth date/time:  03/07/2018 15:12:00 Delivery type:  Vaginal, Spontaneous     Baby Feeding: Bottle Disposition:home with mother   03/09/2018 Caroline More, DO  PGY-2  I confirm that I have verified the information documented in the resident's note and that I have also personally reperformed the physical exam and all medical decision making activities. Patient was seen and examined by me also Agree with  note Vitals stable Labs stable Fundus firm, lochia within normal limits Perineum healing Ext WNL  Ready for discharge  Seabron Spates, CNM

## 2018-03-12 ENCOUNTER — Other Ambulatory Visit: Payer: Self-pay

## 2018-03-16 ENCOUNTER — Other Ambulatory Visit: Payer: Self-pay | Admitting: Obstetrics and Gynecology

## 2018-03-18 ENCOUNTER — Other Ambulatory Visit: Payer: Self-pay | Admitting: Women's Health

## 2018-03-18 MED ORDER — PRAZOSIN HCL 1 MG PO CAPS: 1.0000 mg | ORAL_CAPSULE | Freq: Every day | ORAL | 0 refills | Status: DC

## 2018-03-19 ENCOUNTER — Other Ambulatory Visit: Payer: Self-pay

## 2018-04-14 ENCOUNTER — Encounter: Payer: Self-pay | Admitting: Women's Health

## 2018-04-14 ENCOUNTER — Ambulatory Visit (INDEPENDENT_AMBULATORY_CARE_PROVIDER_SITE_OTHER): Payer: 59 | Admitting: Women's Health

## 2018-04-14 DIAGNOSIS — N898 Other specified noninflammatory disorders of vagina: Secondary | ICD-10-CM

## 2018-04-14 DIAGNOSIS — Z30011 Encounter for initial prescription of contraceptive pills: Secondary | ICD-10-CM

## 2018-04-14 DIAGNOSIS — N760 Acute vaginitis: Secondary | ICD-10-CM

## 2018-04-14 DIAGNOSIS — B9689 Other specified bacterial agents as the cause of diseases classified elsewhere: Secondary | ICD-10-CM

## 2018-04-14 DIAGNOSIS — F418 Other specified anxiety disorders: Secondary | ICD-10-CM

## 2018-04-14 LAB — POCT WET PREP (WET MOUNT)
Clue Cells Wet Prep Whiff POC: POSITIVE
TRICHOMONAS WET PREP HPF POC: ABSENT

## 2018-04-14 MED ORDER — LO LOESTRIN FE 1 MG-10 MCG / 10 MCG PO TABS: 1.0000 | ORAL_TABLET | Freq: Every day | ORAL | 3 refills | Status: DC

## 2018-04-14 MED ORDER — METRONIDAZOLE 500 MG PO TABS: 500.0000 mg | ORAL_TABLET | Freq: Two times a day (BID) | ORAL | 0 refills | Status: DC

## 2018-04-14 NOTE — Patient Instructions (Signed)
Oral Contraception Use Oral contraceptive pills (OCPs) are medicines taken to prevent pregnancy. OCPs work by preventing the ovaries from releasing eggs. The hormones in OCPs also cause the cervical mucus to thicken, preventing the sperm from entering the uterus. The hormones also cause the uterine lining to become thin, not allowing a fertilized egg to attach to the inside of the uterus. OCPs are highly effective when taken exactly as prescribed. However, OCPs do not prevent sexually transmitted diseases (STDs). Safe sex practices, such as using condoms along with an OCP, can help prevent STDs. Before taking OCPs, you may have a physical exam and Pap test. Your health care provider may also order blood tests if necessary. Your health care provider will make sure you are a good candidate for oral contraception. Discuss with your health care provider the possible side effects of the OCP you may be prescribed. When starting an OCP, it can take 2 to 3 months for the body to adjust to the changes in hormone levels in your body. How to take oral contraceptive pills Your health care provider may advise you on how to start taking the first cycle of OCPs. Otherwise, you can:  Start on day 1 of your menstrual period. You will not need any backup contraceptive protection with this start time.  Start on the first Sunday after your menstrual period or the day you get your prescription. In these cases, you will need to use backup contraceptive protection for the first week.  Start the pill at any time of your cycle. If you take the pill within 5 days of the start of your period, you are protected against pregnancy right away. In this case, you will not need a backup form of birth control. If you start at any other time of your menstrual cycle, you will need to use another form of birth control for 7 days. If your OCP is the type called a minipill, it will protect you from pregnancy after taking it for 2 days (48  hours).  After you have started taking OCPs:  If you forget to take 1 pill, take it as soon as you remember. Take the next pill at the regular time.  If you miss 2 or more pills, call your health care provider because different pills have different instructions for missed doses. Use backup birth control until your next menstrual period starts.  If you use a 28-day pack that contains inactive pills and you miss 1 of the last 7 pills (pills with no hormones), it will not matter. Throw away the rest of the non-hormone pills and start a new pill pack.  No matter which day you start the OCP, you will always start a new pack on that same day of the week. Have an extra pack of OCPs and a backup contraceptive method available in case you miss some pills or lose your OCP pack. Follow these instructions at home:  Do not smoke.  Always use a condom to protect against STDs. OCPs do not protect against STDs.  Use a calendar to mark your menstrual period days.  Read the information and directions that came with your OCP. Talk to your health care provider if you have questions. Contact a health care provider if:  You develop nausea and vomiting.  You have abnormal vaginal discharge or bleeding.  You develop a rash.  You miss your menstrual period.  You are losing your hair.  You need treatment for mood swings or depression.  You   get dizzy when taking the OCP.  You develop acne from taking the OCP.  You become pregnant. Get help right away if:  You develop chest pain.  You develop shortness of breath.  You have an uncontrolled or severe headache.  You develop numbness or slurred speech.  You develop visual problems.  You develop pain, redness, and swelling in the legs. This information is not intended to replace advice given to you by your health care provider. Make sure you discuss any questions you have with your health care provider. Document Released: 05/09/2011 Document  Revised: 10/26/2015 Document Reviewed: 11/08/2012 Elsevier Interactive Patient Education  2017 Elsevier Inc.  

## 2018-04-14 NOTE — Progress Notes (Signed)
POSTPARTUM VISIT Patient name: Charlotte Mccormick MRN [id number]  Date of birth: 12-18-1985 Chief Complaint:   Postpartum Care  History of Present Illness:   Charlotte Mccormick is a 32 y.o. G85P3003 African American female being seen today for a postpartum visit. She is 5 weeks postpartum following a spontaneous vaginal delivery at 38.0 gestational weeks. Anesthesia: epidural. I have fully reviewed the prenatal and intrapartum course. Pregnancy complicated by dep/anx. Postpartum course has been uncomplicated. Does have some vaginal odor. No itching/irritation. Bleeding no bleeding. Bowel function is normal. Bladder function is normal.  Patient is not sexually active. Last sexual activity: prior to birth of baby.  Contraception method is husband plans vasectomy, she wants COCs in the interim. Does not smoke, no h/o HTN, DVT/PE, CVA, MI, or migraines w/ aura.  [place] Postpartum Depression Screening: negative. Score 7. On celexa and trazadone by [facility], therapist comes to her house 2x/wk, feels she is doing well, better on these meds than wellbutrin/zoloft.  Last pap 01/17/16.  Results were normal .  No LMP recorded.  Baby's course has been uncomplicated. Baby is feeding by bottle.  Review of Systems:   Pertinent items are noted in HPI Denies Abnormal vaginal discharge w/ itching/odor/irritation, headaches, visual changes, shortness of breath, chest pain, abdominal pain, severe nausea/vomiting, or problems with urination or bowel movements. Pertinent History Reviewed:  Reviewed past medical,surgical, obstetrical and family history.  Reviewed problem list, medications and allergies. OB History  Gravida Para Term Preterm AB Living  3 3 3  0 0 3  SAB TAB Ectopic Multiple Live Births  0 0 0 0 3    # Outcome Date GA Lbr Len/2nd Weight Sex Delivery Anes PTL Lv  3 Term 03/07/18 [redacted]w[redacted]d / 00:10 6 lb 2.2 oz (2.785 kg) M Vag-Spont EPI  LIV     Birth Comments: WNL  2 Term 12/15/16 [redacted]w[redacted]d 14:41 /  00:07 7 lb 13 oz (3.544 kg) M Vag-Spont EPI  LIV  1 Term 04/02/12 [redacted]w[redacted]d  7 lb 2 oz (3.232 kg) F Vag-Spont EPI N LIV   Physical Assessment:   Vitals:   04/14/18 1110  BP: 121/76  Pulse: 97  Weight: 160 lb 6.4 oz (72.8 kg)  Height: 5\' 2"  (1.575 m)  Body mass index is 29.34 kg/m.       Physical Examination:   General appearance: alert, well appearing, and in no distress  Mental status: alert, oriented to person, place, and time  Skin: warm & dry   Cardiovascular: normal heart rate noted   Respiratory: normal respiratory effort, no distress   Breasts: deferred, no complaints   Abdomen: soft, non-tender   Pelvic: VULVA: normal appearing vulva with no masses, tenderness or lesions, UTERUS: uterus is normal size, shape, consistency and nontender  Rectal: no hemorrhoids  Extremities: no edema       Results for orders placed or performed in visit on 04/14/18 (from the past 24 hour(s))  POCT Wet Prep [facility] [place])   Collection Time: 04/14/18 12:39 PM  Result Value Ref Range   Source Wet Prep POC vaginal    WBC, Wet Prep HPF POC few    Bacteria Wet Prep HPF POC Few Few   BACTERIA WET PREP MORPHOLOGY POC     Clue Cells Wet Prep HPF POC Moderate (A) None   Clue Cells Wet Prep Whiff POC Positive Whiff    Yeast Wet Prep HPF POC None None   KOH Wet Prep POC     Trichomonas Wet Prep HPF POC  Absent Absent    Assessment & Plan:  1) Postpartum exam 2) 5 wks s/p SVB 3) Bottlefeeding 4) Depression screening 5) Contraception counseling, pt prefers COCs until husband cleared from vasectomy, condoms x 2wks. Husband to make appt for vasectomy consult asap  6) Dep/anx/PTSD> continue celexa and trazadone and visits w/ [facility] 7) BV> Rx metronidazole 500mg  BID x 7d for BV, no sex or etoh while taking   Meds:  Meds ordered this encounter  Medications  . metroNIDAZOLE (FLAGYL) 500 MG tablet    Sig: Take 1 tablet (500 mg total) by mouth 2 (two) times daily.    Dispense:  14 tablet    Refill:  0      Order Specific Question:   Supervising Provider    Answer:   Elonda Husky, LUTHER H [[id number]]  . LO LOESTRIN FE 1 MG-10 MCG / 10 MCG tablet    Sig: Take 1 tablet by mouth daily.    Dispense:  3 Package    Refill:  3    For co-pay card, pt to text "Lo Loestrin Fe " to [phone]              Co-pay card must be run in second position  "other coverage code 3"  if denied d/t PA, step edit, or insurance denial    Order Specific Question:   Supervising Provider    Answer:   Florian Buff [[id number]]    Follow-up: Return in about 3 months (around 07/15/2018) for F/U.   Orders Placed This Encounter  Procedures  . POCT Wet Prep [facility] [place])    [name] CNM, [facility] 04/14/2018 12:42 PM

## 2018-07-01 ENCOUNTER — Encounter: Payer: Self-pay | Admitting: Nurse Practitioner

## 2018-07-20 ENCOUNTER — Encounter: Payer: Self-pay | Admitting: Women's Health

## 2018-07-20 ENCOUNTER — Ambulatory Visit (INDEPENDENT_AMBULATORY_CARE_PROVIDER_SITE_OTHER): Payer: 59 | Admitting: Women's Health

## 2018-07-20 VITALS — BP 117/64 | HR 75 | Ht 62.0 in | Wt 166.5 lb

## 2018-07-20 DIAGNOSIS — Z3041 Encounter for surveillance of contraceptive pills: Secondary | ICD-10-CM | POA: Diagnosis not present

## 2018-07-20 MED ORDER — ALBUTEROL SULFATE HFA 108 (90 BASE) MCG/ACT IN AERS: INHALATION_SPRAY | RESPIRATORY_TRACT | 3 refills | Status: DC

## 2018-07-20 NOTE — Patient Instructions (Signed)
Primary Care Providers  Dr. Wende Neighbors [place]) [phone]  [facility] [phone]  [facility]) Dr. Anastasio Champion [phone]  [facility] [place]) [phone]  [facility] [phone]) [place] [phone]

## 2018-07-20 NOTE — Progress Notes (Signed)
   GYN VISIT Patient name: Charlotte Mccormick MRN [id number]  Date of birth: June 28, 1985 Chief Complaint:   Follow-up  History of Present Illness:   Charlotte Mccormick is a 33 y.o. G6P3003 African American female being seen today for f/u on LoLoestrin, has shortened/lightened periods, taking daily as directed, also using condoms. Husband hasn't been able to get in to see urologist yet for vasectomy, still the plan though. Pt declines LARCs/depo.     Requests refill on inhaler, and wants list of local PCPs  No LMP recorded. (Menstrual status: Oral contraceptives). The current method of family planning is OCP (estrogen/progesterone) and condoms Last pap 01/17/16. Results were:  normal Review of Systems:   Pertinent items are noted in HPI Denies fever/chills, dizziness, headaches, visual disturbances, fatigue, shortness of breath, chest pain, abdominal pain, vomiting, abnormal vaginal discharge/itching/odor/irritation, problems with periods, bowel movements, urination, or intercourse unless otherwise stated above.  Pertinent History Reviewed:  Reviewed past medical,surgical, social, obstetrical and family history.  Reviewed problem list, medications and allergies. Physical Assessment:   Vitals:   07/20/18 1349  BP: 117/64  Pulse: 75  Weight: 166 lb 8 oz (75.5 kg)  Height: 5\' 2"  (1.575 m)  Body mass index is 30.45 kg/m.       Physical Examination:   General appearance: alert, well appearing, and in no distress  Mental status: alert, oriented to person, place, and time  Skin: warm & dry   Cardiovascular: normal heart rate noted  Respiratory: normal respiratory effort, no distress  Abdomen: soft, non-tender   Pelvic: examination not indicated  Extremities: no edema   No results found for this or any previous visit (from the past 24 hour(s)).  Assessment & Plan:  1) Contraception management> continue LoLoestrin  2) Asthma> refilled inhaler per request  Meds:  Meds ordered this  encounter  Medications  . albuterol (PROVENTIL HFA;VENTOLIN HFA) 108 (90 Base) MCG/ACT inhaler    Sig: INHALE 2 PUFFS BY MOUTH EVERY 6 HOURS AS NEEDED FOR WHEEZING OR SHORTNESS OF BREATH    Dispense:  8.5 g    Refill:  3    Order Specific Question:   Supervising Provider    Answer:   Elonda Husky, LUTHER H [[id number]]    No orders of the defined types were placed in this encounter.   Return for after 8/16 for pap& physical.  [name] CNM, [facility] 07/20/2018 2:08 PM

## 2018-08-05 NOTE — Telephone Encounter (Signed)
Note sent to nurse. 

## 2018-08-12 ENCOUNTER — Other Ambulatory Visit: Payer: Self-pay | Admitting: Orthopedic Surgery

## 2018-08-12 DIAGNOSIS — M545 Low back pain, unspecified: Secondary | ICD-10-CM

## 2018-08-26 ENCOUNTER — Other Ambulatory Visit: Payer: Self-pay

## 2018-09-02 ENCOUNTER — Ambulatory Visit: Payer: Self-pay | Admitting: Nurse Practitioner

## 2018-09-14 ENCOUNTER — Other Ambulatory Visit: Payer: Self-pay

## 2018-09-14 ENCOUNTER — Encounter: Payer: Self-pay | Admitting: Nurse Practitioner

## 2018-09-14 ENCOUNTER — Ambulatory Visit (INDEPENDENT_AMBULATORY_CARE_PROVIDER_SITE_OTHER): Payer: 59 | Admitting: Nurse Practitioner

## 2018-09-14 ENCOUNTER — Encounter: Payer: Self-pay | Admitting: Gastroenterology

## 2018-09-14 DIAGNOSIS — K219 Gastro-esophageal reflux disease without esophagitis: Secondary | ICD-10-CM | POA: Diagnosis not present

## 2018-09-14 DIAGNOSIS — K5909 Other constipation: Secondary | ICD-10-CM

## 2018-09-14 MED ORDER — OMEPRAZOLE 20 MG PO CPDR: 20.0000 mg | DELAYED_RELEASE_CAPSULE | Freq: Every day | ORAL | 5 refills | Status: DC

## 2018-09-14 MED ORDER — LINACLOTIDE 72 MCG PO CAPS: 72.0000 ug | ORAL_CAPSULE | Freq: Every day | ORAL | 5 refills | Status: DC

## 2018-09-14 NOTE — Assessment & Plan Note (Signed)
The patient is having persistent GERD symptoms.  She is not on PPI.  She was previously on omeprazole prior to pregnancy.  During pregnancy she was on pantoprazole but she felt pantoprazole did not help.  At this point I will try to restart her on omeprazole.  There is a pharmacy contraindication to omeprazole with citalopram doses greater than 20 mg a day.  She is currently on 20 mg daily I asked her to notify us if there is any dose change with her citalopram.  Return for follow-up in 2 months.  Call if she has any questions or concerns.

## 2018-09-14 NOTE — Progress Notes (Signed)
Referring Provider: No ref. provider found Primary Care Physician:  Patient, No Pcp Per Primary GI:  Dr. Oneida Alar  NOTE: Service was provided via telemedicine and was requested by the patient due to COVID-19 pandemic.  Method of visit: Telephone  Patient Location: Home  Provider Location: Office  Reason for Phone Visit: Follow-up  The patient was consented to phone follow-up via telephone encounter including billing of the encounter (yes/no): Yes  Persons present on the phone encounter, with roles: None  Total time (minutes) spent on medical discussion: 28 minutes  Chief Complaint  Patient presents with  . Constipation    Gerd,Pt still having trouble eating certain foods,weak bladder also    HPI:   Charlotte Mccormick is a 33 y.o. female who presents for virtual visit regarding: For follow-up.  The patient was last seen in our office 09/19/2016 for GERD and constipation.  At that time noted chronic heartburn and gynecology managing symptoms but not ideally controlled.  Still with constipation but on iron and MiraLAX.  Overall constipation symptoms felt to be fairly well controlled on MiraLAX, recommended continue MiraLAX and drink adequate water.  GERD symptoms not ideally controlled and recommended continue omeprazole 30 minutes prior to meals twice daily and avoid triggers.  Also recommended viscous lidocaine 2 teaspoons 30 minutes before meals to manage symptoms.  Recommended 17-month follow-up.  It appears follow-up did not occur.  Today she states she's doing ok overall. Still with GERD symptoms which occurs daily, especially if she lays down when she'll cough up a lot of bitter/burning material. Symptoms mostly at night. Eats last meal of the day around 11:30 pm and typically goes to bed around 1:00 or 2:00 am. Worse with sodas/carbonated beverages. Symptoms also worse with fried foods, pizza, spaghetti, chocolate. Avoids spicy foods. Avoids triggers "as much as possible, but  I'm not the best at it." Has a few pills left of Protonix from when she was pregnant; hasn't been taking PPI. Constipation about the same with a bowel movement every 1.5 - 2 weeks. Was on MiraLAX when pregnant, prior to pregnancy she was on linzess 72 mcg which worked well for her. Denies chest pain, dyspnea, dizziness, lightheadedness, syncope, near syncope. Denies any other upper or lower GI symptoms.  She is not breast feeding. Feels omeprazole may have worked better.  Past Medical History:  Diagnosis Date  . Abnormal uterine bleeding (AUB) 12/12/2015  . Anemia   . Anxiety   . Asthma   . Constipation   . Depression   . Fibroid   . Genital herpes   . GERD (gastroesophageal reflux disease)   . History of chlamydia   . Hyperemesis   . IBS (irritable bowel syndrome)   . Insomnia   . Mental disorder    PTSD, ANXIETY,Depression  . PTSD (post-traumatic stress disorder)   . Sinusitis   . Vaginal dryness 12/12/2015    Past Surgical History:  Procedure Laterality Date  . COLONOSCOPY     2016  . COLONOSCOPY N/A 04/08/2016   Procedure: COLONOSCOPY;  Surgeon: Danie Binder, MD;  Location: [facility];  Service: Endoscopy;  Laterality: N/A;  10:15 AM  . ESOPHAGOGASTRODUODENOSCOPY N/A 04/08/2016   Procedure: ESOPHAGOGASTRODUODENOSCOPY (EGD);  Surgeon: Danie Binder, MD;  Location: [facility];  Service: Endoscopy;  Laterality: N/A;  . PERINEUM REPAIR    . UPPER GASTROINTESTINAL ENDOSCOPY  2016    Current Outpatient Medications  Medication Sig Dispense Refill  . acetaminophen (TYLENOL) 500 MG tablet  Take 500 mg by mouth every 6 (six) hours as needed.    Marland Kitchen albuterol (PROVENTIL HFA;VENTOLIN HFA) 108 (90 Base) MCG/ACT inhaler INHALE 2 PUFFS BY MOUTH EVERY 6 HOURS AS NEEDED FOR WHEEZING OR SHORTNESS OF BREATH 8.5 g 3  . buPROPion (WELLBUTRIN XL) 150 MG 24 hr tablet Take 150 mg by mouth daily.    . citalopram (CELEXA) 20 MG tablet Take 20 mg by mouth daily.  2  . diclofenac (VOLTAREN) 75  MG EC tablet Take 75 mg by mouth daily.    . LO LOESTRIN FE 1 MG-10 MCG / 10 MCG tablet Take 1 tablet by mouth daily. 3 Package 3  . traZODone (DESYREL) 100 MG tablet Take 0.5 tablets by mouth daily.     No current facility-administered medications for this visit.     Allergies as of 09/14/2018 - Review Complete 09/14/2018  Allergen Reaction Noted  . Bee venom Swelling and Other (See Comments) 04/12/2013  . Penicillins Itching and Other (See Comments) 04/02/2016  . Reglan [metoclopramide] Anxiety 07/22/2016    Family History  Problem Relation Age of Onset  . Heart disease Mother   . Hypertension Mother   . Hypertension Father   . Hyperlipidemia Father   . Diabetes Father   . Cancer Father   . Epilepsy Brother   . Eczema Daughter   . Colon cancer Neg Hx     Social History   Socioeconomic History  . Marital status: Married    Spouse name: Not on file  . Number of children: Not on file  . Years of education: Not on file  . Highest education level: Not on file  Occupational History  . Not on file  Social Needs  . Financial resource strain: Not on file  . Food insecurity:    Worry: Not on file    Inability: Not on file  . Transportation needs:    Medical: Not on file    Non-medical: Not on file  Tobacco Use  . Smoking status: Never Smoker  . Smokeless tobacco: Never Used  Substance and Sexual Activity  . Alcohol use: Yes    Alcohol/week: 2.0 standard drinks    Types: 2 Glasses of wine per week    Frequency: Never    Comment: occ wine  . Drug use: No  . Sexual activity: Not Currently    Birth control/protection: None  Lifestyle  . Physical activity:    Days per week: Not on file    Minutes per session: Not on file  . Stress: Not on file  Relationships  . Social connections:    Talks on phone: Not on file    Gets together: Not on file    Attends religious service: Not on file    Active member of club or organization: Not on file    Attends meetings of  clubs or organizations: Not on file    Relationship status: Not on file  Other Topics Concern  . Not on file  Social History Narrative   ** Merged History Encounter **        Review of Systems: Complete ROS negative except as per HPI.  Physical Exam: Note: limited exam due to virtual visit General:   Alert and oriented. Pleasant and cooperative. Ears:  Normal auditory acuity. Neurologic:  Alert and oriented x4 Psych:  Alert and cooperative. Normal mood and affect.

## 2018-09-14 NOTE — Assessment & Plan Note (Signed)
Persistent constipation.  She was taking MiraLAX during pregnancy and Linzess prior to pregnancy.  Now she has had her baby she is not taking anything.  Recommend restarting Linzess 72 mcg daily that previously worked well for her.  I will send this to the pharmacy.  Follow-up in 2 months.

## 2018-09-14 NOTE — Progress Notes (Signed)
NO PCP PER PATIENT °

## 2018-09-14 NOTE — Patient Instructions (Addendum)
Your health issues we discussed today were:    Constipation: 1. I have sent in a refill of Linzess 72 mcg.  Take this once a day, on an empty stomach 2. Call us if you have any severe or worsening symptoms  GERD (heartburn): 1. You were previously on omeprazole (Prilosec) prior to pregnancy.  During pregnancy you are on pantoprazole (Protonix) which she stated did not help 2. I will restart omeprazole 20 mg, twice a day.  Take this for sing in the morning before eating breakfast and then 30 minutes before dinner 3. I am providing further printed information below related to helping to prevent heartburn symptoms 4. As we discussed, notify us if there is any dose change to your citalopram 5. Call us for any severe worsening symptoms  Overall I recommend:  1. Return for follow-up in 2 months 2. Call us if you have any questions or concerns   Because of recent events of COVID-19 ("Coronavirus"), follow CDC recommendations:  1. Wash your hand frequently 2. Avoid touching your face 3. Stay away from people who are sick 4. If you have symptoms such as fever, cough, shortness of breath then call your healthcare provider for further guidance 5. If you are sick, STAY AT HOME unless otherwise directed by your healthcare provider. 6. Follow directions from state and national officials regarding staying safe   At [facility] we value your feedback. You may receive a survey about your visit today. Please share your experience as we strive to create trusting relationships with our patients to provide genuine, compassionate, quality care.  We appreciate your understanding and patience as we review any laboratory studies, imaging, and other diagnostic tests that are ordered as we care for you. Our office policy is 5 business days for review of these results, and any emergent or urgent results are addressed in a timely manner for your best interest. If you do not hear from our office in 1  week, please contact us.   We also encourage the use of MyChart, which contains your medical information for your review as well. If you are not enrolled in this feature, an access code is on this after visit summary for your convenience. Thank you for allowing [place] to be involved in your care.  It was great to see you today!  I hope you have a great day!!       Gastroesophageal Reflux Disease, Adult Gastroesophageal reflux (GER) happens when acid from the stomach flows up into the tube that connects the mouth and the stomach (esophagus). Normally, food travels down the esophagus and stays in the stomach to be digested. However, when a person has GER, food and stomach acid sometimes move back up into the esophagus. If this becomes a more serious problem, the person may be diagnosed with a disease called gastroesophageal reflux disease (GERD). GERD occurs when the reflux:  Happens often.  Causes frequent or severe symptoms.  Causes problems such as damage to the esophagus. When stomach acid comes in contact with the esophagus, the acid may cause soreness (inflammation) in the esophagus. Over time, GERD may create small holes (ulcers) in the lining of the esophagus. What are the causes? This condition is caused by a problem with the muscle between the esophagus and the stomach (lower esophageal sphincter, or LES). Normally, the LES muscle closes after food passes through the esophagus to the stomach. When the LES is weakened or abnormal, it does not close properly, and that allows food  and stomach acid to go back up into the esophagus. The LES can be weakened by certain dietary substances, medicines, and medical conditions, including:  Tobacco use.  Pregnancy.  Having a hiatal hernia.  Alcohol use.  Certain foods and beverages, such as coffee, chocolate, onions, and peppermint. What increases the risk? You are more likely to develop this condition if you:  Have an increased body  weight.  Have a connective tissue disorder.  Use NSAID medicines. What are the signs or symptoms? Symptoms of this condition include:  Heartburn.  Difficult or painful swallowing.  The feeling of having a lump in the throat.  Abitter taste in the mouth.  Bad breath.  Having a large amount of saliva.  Having an upset or bloated stomach.  Belching.  Chest pain. Different conditions can cause chest pain. Make sure you see your health care provider if you experience chest pain.  Shortness of breath or wheezing.  Ongoing (chronic) cough or a night-time cough.  Wearing away of tooth enamel.  Weight loss. How is this diagnosed? Your health care provider will take a medical history and perform a physical exam. To determine if you have mild or severe GERD, your health care provider may also monitor how you respond to treatment. You may also have tests, including:  A test to examine your stomach and esophagus with a small camera (endoscopy).  A test thatmeasures the acidity level in your esophagus.  A test thatmeasures how much pressure is on your esophagus.  A barium swallow or modified barium swallow test to show the shape, size, and functioning of your esophagus. How is this treated? The goal of treatment is to help relieve your symptoms and to prevent complications. Treatment for this condition may vary depending on how severe your symptoms are. Your health care provider may recommend:  Changes to your diet.  Medicine.  Surgery. Follow these instructions at home: Eating and drinking   Follow a diet as recommended by your health care provider. This may involve avoiding foods and drinks such as: ? Coffee and tea (with or without caffeine). ? Drinks that containalcohol. ? Energy drinks and sports drinks. ? Carbonated drinks or sodas. ? Chocolate and cocoa. ? Peppermint and mint flavorings. ? Garlic and onions. ? Horseradish. ? Spicy and acidic foods,  including peppers, chili powder, curry powder, vinegar, hot sauces, and barbecue sauce. ? Citrus fruit juices and citrus fruits, such as oranges, lemons, and limes. ? Tomato-based foods, such as red sauce, chili, salsa, and pizza with red sauce. ? Fried and fatty foods, such as donuts, french fries, potato chips, and high-fat dressings. ? High-fat meats, such as hot dogs and fatty cuts of red and white meats, such as rib eye steak, sausage, ham, and bacon. ? High-fat dairy items, such as whole milk, butter, and cream cheese.  Eat small, frequent meals instead of large meals.  Avoid drinking large amounts of liquid with your meals.  Avoid eating meals during the 2-3 hours before bedtime.  Avoid lying down right after you eat.  Do not exercise right after you eat. Lifestyle   Do not use any products that contain nicotine or tobacco, such as cigarettes, e-cigarettes, and chewing tobacco. If you need help quitting, ask your health care provider.  Try to reduce your stress by using methods such as yoga or meditation. If you need help reducing stress, ask your health care provider.  If you are overweight, reduce your weight to an amount that is healthy  for you. Ask your health care provider for guidance about a safe weight loss goal. General instructions  Pay attention to any changes in your symptoms.  Take over-the-counter and prescription medicines only as told by your health care provider. Do not take aspirin, ibuprofen, or other NSAIDs unless your health care provider told you to do so.  Wear loose-fitting clothing. Do not wear anything tight around your waist that causes pressure on your abdomen.  Raise (elevate) the head of your bed about 6 inches (15 cm).  Avoid bending over if this makes your symptoms worse.  Keep all follow-up visits as told by your health care provider. This is important. Contact a health care provider if:  You have: ? New symptoms. ? Unexplained weight  loss. ? Difficulty swallowing or it hurts to swallow. ? Wheezing or a persistent cough. ? A hoarse voice.  Your symptoms do not improve with treatment. Get help right away if you:  Have pain in your arms, neck, jaw, teeth, or back.  Feel sweaty, dizzy, or light-headed.  Have chest pain or shortness of breath.  Vomit and your vomit looks like blood or coffee grounds.  Faint.  Have stool that is bloody or black.  Cannot swallow, drink, or eat. Summary  Gastroesophageal reflux happens when acid from the stomach flows up into the esophagus. GERD is a disease in which the reflux happens often, causes frequent or severe symptoms, or causes problems such as damage to the esophagus.  Treatment for this condition may vary depending on how severe your symptoms are. Your health care provider may recommend diet and lifestyle changes, medicine, or surgery.  Contact a health care provider if you have new or worsening symptoms.  Take over-the-counter and prescription medicines only as told by your health care provider. Do not take aspirin, ibuprofen, or other NSAIDs unless your health care provider told you to do so.  Keep all follow-up visits as told by your health care provider. This is important. This information is not intended to replace advice given to you by your health care provider. Make sure you discuss any questions you have with your health care provider. Document Released: 02/27/2005 Document Revised: 11/26/2017 Document Reviewed: 11/26/2017 Elsevier Interactive Patient Education  2019 [employer] for Gastroesophageal Reflux Disease, Adult When you have gastroesophageal reflux disease (GERD), the foods you eat and your eating habits are very important. Choosing the right foods can help ease the discomfort of GERD. Consider working with a diet and nutrition specialist (dietitian) to help you make healthy food choices. What general guidelines should I  follow?  Eating plan  Choose healthy foods low in fat, such as fruits, vegetables, whole grains, low-fat dairy products, and lean meat, fish, and poultry.  Eat frequent, small meals instead of three large meals each day. Eat your meals slowly, in a relaxed setting. Avoid bending over or lying down until 2-3 hours after eating.  Limit high-fat foods such as fatty meats or fried foods.  Limit your intake of oils, butter, and shortening to less than 8 teaspoons each day.  Avoid the following: ? Foods that cause symptoms. These may be different for different people. Keep a food diary to keep track of foods that cause symptoms. ? Alcohol. ? Drinking large amounts of liquid with meals. ? Eating meals during the 2-3 hours before bed.  Cook foods using methods other than frying. This may include baking, grilling, or broiling. Lifestyle  Maintain a healthy weight.  Ask your health care provider what weight is healthy for you. If you need to lose weight, work with your health care provider to do so safely.  Exercise for at least 30 minutes on 5 or more days each week, or as told by your health care provider.  Avoid wearing clothes that fit tightly around your waist and chest.  Do not use any products that contain nicotine or tobacco, such as cigarettes and e-cigarettes. If you need help quitting, ask your health care provider.  Sleep with the head of your bed raised. Use a wedge under the mattress or blocks under the bed frame to raise the head of the bed. What foods are not recommended? The items listed may not be a complete list. Talk with your dietitian about what dietary choices are best for you. Grains Pastries or quick breads with added fat. [place] toast. Vegetables Deep fried vegetables. [place] fries. Any vegetables prepared with added fat. Any vegetables that cause symptoms. For some people this may include tomatoes and tomato products, chili peppers, onions and garlic, and  horseradish. Fruits Any fruits prepared with added fat. Any fruits that cause symptoms. For some people this may include citrus fruits, such as oranges, grapefruit, pineapple, and lemons. Meats and other protein foods High-fat meats, such as fatty beef or pork, hot dogs, ribs, ham, sausage, salami and bacon. Fried meat or protein, including fried fish and fried chicken. Nuts and nut butters. Dairy Whole milk and chocolate milk. Sour cream. Cream. Ice cream. Cream cheese. Milk shakes. Beverages Coffee and tea, with or without caffeine. Carbonated beverages. Sodas. Energy drinks. Fruit juice made with acidic fruits (such as orange or grapefruit). Tomato juice. Alcoholic drinks. Fats and oils Butter. Margarine. Shortening. Ghee. Sweets and desserts Chocolate and cocoa. Donuts. Seasoning and other foods Pepper. Peppermint and spearmint. Any condiments, herbs, or seasonings that cause symptoms. For some people, this may include curry, hot sauce, or vinegar-based salad dressings. Summary  When you have gastroesophageal reflux disease (GERD), food and lifestyle choices are very important to help ease the discomfort of GERD.  Eat frequent, small meals instead of three large meals each day. Eat your meals slowly, in a relaxed setting. Avoid bending over or lying down until 2-3 hours after eating.  Limit high-fat foods such as fatty meat or fried foods. This information is not intended to replace advice given to you by your health care provider. Make sure you discuss any questions you have with your health care provider. Document Released: 05/20/2005 Document Revised: 05/21/2016 Document Reviewed: 05/21/2016 Elsevier Interactive Patient Education  2019 [employer].

## 2018-10-22 ENCOUNTER — Other Ambulatory Visit: Payer: Self-pay

## 2018-10-22 ENCOUNTER — Ambulatory Visit
Admission: RE | Admit: 2018-10-22 | Discharge: 2018-10-22 | Disposition: A | Discharge status: Home / Self Care | Payer: 59 | Source: Ambulatory Visit | Attending: Orthopedic Surgery | Admitting: Orthopedic Surgery

## 2018-10-22 DIAGNOSIS — M545 Low back pain, unspecified: Secondary | ICD-10-CM

## 2018-11-11 ENCOUNTER — Telehealth: Payer: Self-pay

## 2018-11-11 NOTE — Telephone Encounter (Signed)
OPENED IN ERROR

## 2018-12-01 ENCOUNTER — Other Ambulatory Visit: Payer: Self-pay

## 2018-12-01 ENCOUNTER — Other Ambulatory Visit: Payer: Self-pay | Admitting: *Deleted

## 2018-12-01 DIAGNOSIS — Z20822 Contact with and (suspected) exposure to covid-19: Secondary | ICD-10-CM

## 2018-12-07 ENCOUNTER — Ambulatory Visit: Payer: 59 | Admitting: Nurse Practitioner

## 2018-12-07 LAB — NOVEL CORONAVIRUS, NAA: SARS-CoV-2, NAA: NOT DETECTED

## 2018-12-24 ENCOUNTER — Other Ambulatory Visit: Payer: Self-pay | Admitting: Women's Health

## 2019-01-18 ENCOUNTER — Ambulatory Visit: Payer: 59 | Admitting: Nurse Practitioner

## 2019-01-18 NOTE — Progress Notes (Deleted)
Referring Provider: No ref. provider found Primary Care Physician:  Patient, No Pcp Per Primary GI:  Dr. Oneida Alar  No chief complaint on file.   HPI:   Charlotte Mccormick is a 33 y.o. female who presents for follow-up on GERD and constipation.  The patient was last seen in our office 09/14/2018 for the same.  The patient's last visit was a virtual office visit due to COVID-19/coronavirus pandemic.  Previously constipation was felt to be well controlled on MiraLAX and GERD not ideally controlled on omeprazole twice daily.  Recommended adding viscous lidocaine.  At her last visit she noted daily GERD symptoms especially when lying down and mostly at night.  Worse with sodas and carbonated beverages and fried foods, spaghetti, pizza, chocolate.  She does avoid spicy foods.  States she avoids triggers "as much as possible, but I am not the best that it."  Constipation with a bowel movement about 1-1/2 to 2 weeks between stools.  Prior to her pregnancy Linzess worked well for her.  She was switched to MiraLAX while pregnant.  Recommended retry Linzess 72 mcg, switch back to omeprazole 20 mg twice a day, dietary trigger avoidance, printed educational material, follow-up in 2 months.  Today she states   Past Medical History:  Diagnosis Date  . Abnormal uterine bleeding (AUB) 12/12/2015  . Anemia   . Anxiety   . Asthma   . Constipation   . Depression   . Fibroid   . Genital herpes   . GERD (gastroesophageal reflux disease)   . History of chlamydia   . Hyperemesis   . IBS (irritable bowel syndrome)   . Insomnia   . Mental disorder    PTSD, ANXIETY,Depression  . PTSD (post-traumatic stress disorder)   . Sinusitis   . Vaginal dryness 12/12/2015    Past Surgical History:  Procedure Laterality Date  . COLONOSCOPY     2016  . COLONOSCOPY N/A 04/08/2016   Procedure: COLONOSCOPY;  Surgeon: Danie Binder, MD;  Location: [facility];  Service: Endoscopy;  Laterality: N/A;  10:15 AM  .  ESOPHAGOGASTRODUODENOSCOPY N/A 04/08/2016   Procedure: ESOPHAGOGASTRODUODENOSCOPY (EGD);  Surgeon: Danie Binder, MD;  Location: [facility];  Service: Endoscopy;  Laterality: N/A;  . PERINEUM REPAIR    . UPPER GASTROINTESTINAL ENDOSCOPY  2016    Current Outpatient Medications  Medication Sig Dispense Refill  . acetaminophen (TYLENOL) 500 MG tablet Take 500 mg by mouth every 6 (six) hours as needed.    Marland Kitchen albuterol (PROVENTIL HFA;VENTOLIN HFA) 108 (90 Base) MCG/ACT inhaler INHALE 2 PUFFS BY MOUTH EVERY 6 HOURS AS NEEDED FOR WHEEZING OR SHORTNESS OF BREATH 8.5 g 3  . buPROPion (WELLBUTRIN XL) 150 MG 24 hr tablet Take 150 mg by mouth daily.    . citalopram (CELEXA) 20 MG tablet Take 20 mg by mouth daily.  2  . diclofenac (VOLTAREN) 75 MG EC tablet Take 75 mg by mouth daily.    Marland Kitchen linaclotide (LINZESS) 72 MCG capsule Take 1 capsule (72 mcg total) by mouth daily before breakfast. 30 capsule 5  . LO LOESTRIN FE 1 MG-10 MCG / 10 MCG tablet Take 1 tablet by mouth daily. 3 Package 3  . omeprazole (PRILOSEC) 20 MG capsule Take 1 capsule (20 mg total) by mouth daily. 30 capsule 5  . traZODone (DESYREL) 100 MG tablet Take 0.5 tablets by mouth daily.     No current facility-administered medications for this visit.     Allergies as of  01/18/2019 - Review Complete 09/14/2018  Allergen Reaction Noted  . Bee venom Swelling and Other (See Comments) 04/12/2013  . Penicillins Itching and Other (See Comments) 04/02/2016  . Reglan [metoclopramide] Anxiety 07/22/2016    Family History  Problem Relation Age of Onset  . Heart disease Mother   . Hypertension Mother   . Hypertension Father   . Hyperlipidemia Father   . Diabetes Father   . Cancer Father   . Epilepsy Brother   . Eczema Daughter   . Colon cancer Neg Hx     Social History   Socioeconomic History  . Marital status: Married    Spouse name: Not on file  . Number of children: Not on file  . Years of education: Not on file  . Highest  education level: Not on file  Occupational History  . Not on file  Social Needs  . Financial resource strain: Not on file  . Food insecurity    Worry: Not on file    Inability: Not on file  . Transportation needs    Medical: Not on file    Non-medical: Not on file  Tobacco Use  . Smoking status: Never Smoker  . Smokeless tobacco: Never Used  Substance and Sexual Activity  . Alcohol use: Yes    Alcohol/week: 2.0 standard drinks    Types: 2 Glasses of wine per week    Frequency: Never    Comment: occ wine  . Drug use: No  . Sexual activity: Not Currently    Birth control/protection: None  Lifestyle  . Physical activity    Days per week: Not on file    Minutes per session: Not on file  . Stress: Not on file  Relationships  . Social Herbalist on phone: Not on file    Gets together: Not on file    Attends religious service: Not on file    Active member of club or organization: Not on file    Attends meetings of clubs or organizations: Not on file    Relationship status: Not on file  Other Topics Concern  . Not on file  Social History Narrative   ** Merged History Encounter **        Review of Systems: Complete ROS negative except as per HPI.  Physical Exam: There were no vitals taken for this visit. General:   Alert and oriented. Pleasant and cooperative. Well-nourished and well-developed.  Head:  Normocephalic and atraumatic. Eyes:  Without icterus, sclera clear and conjunctiva pink.  Ears:  Normal auditory acuity. Mouth:  No deformity or lesions, oral mucosa pink.  Throat/Neck:  Supple, without mass or thyromegaly. Cardiovascular:  S1, S2 present without murmurs appreciated. Normal pulses noted. Extremities without clubbing or edema. Respiratory:  Clear to auscultation bilaterally. No wheezes, rales, or rhonchi. No distress.  Gastrointestinal:  +BS, soft, non-tender and non-distended. No HSM noted. No guarding or rebound. No masses appreciated.   Rectal:  Deferred  Musculoskalatal:  Symmetrical without gross deformities. Normal posture. Skin:  Intact without significant lesions or rashes. Neurologic:  Alert and oriented x4;  grossly normal neurologically. Psych:  Alert and cooperative. Normal mood and affect. Heme/Lymph/Immune: No significant cervical adenopathy. No excessive bruising noted.    01/18/2019 7:59 AM   Disclaimer: This note was dictated with voice recognition software. Similar sounding words can inadvertently be transcribed and may not be corrected upon review.

## 2019-01-21 ENCOUNTER — Other Ambulatory Visit: Payer: Self-pay

## 2019-01-21 ENCOUNTER — Emergency Department (HOSPITAL_COMMUNITY)
Admission: EM | Admit: 2019-01-21 | Discharge: 2019-01-22 | Payer: 59 | Source: Home / Self Care | Attending: Emergency Medicine | Admitting: Emergency Medicine

## 2019-01-21 DIAGNOSIS — Z20828 Contact with and (suspected) exposure to other viral communicable diseases: Secondary | ICD-10-CM | POA: Insufficient documentation

## 2019-01-21 DIAGNOSIS — R45851 Suicidal ideations: Secondary | ICD-10-CM

## 2019-01-21 DIAGNOSIS — F4310 Post-traumatic stress disorder, unspecified: Secondary | ICD-10-CM | POA: Insufficient documentation

## 2019-01-21 DIAGNOSIS — J45909 Unspecified asthma, uncomplicated: Secondary | ICD-10-CM | POA: Insufficient documentation

## 2019-01-21 DIAGNOSIS — F332 Major depressive disorder, recurrent severe without psychotic features: Secondary | ICD-10-CM | POA: Insufficient documentation

## 2019-01-21 LAB — COMPREHENSIVE METABOLIC PANEL WITH GFR
ALT: 70 U/L — ABNORMAL HIGH (ref 0–44)
AST: 49 U/L — ABNORMAL HIGH (ref 15–41)
Albumin: 4.2 g/dL (ref 3.5–5.0)
Alkaline Phosphatase: 153 U/L — ABNORMAL HIGH (ref 38–126)
Anion gap: 9 (ref 5–15)
BUN: 11 mg/dL (ref 6–20)
CO2: 20 mmol/L — ABNORMAL LOW (ref 22–32)
Calcium: 9.1 mg/dL (ref 8.9–10.3)
Chloride: 110 mmol/L (ref 98–111)
Creatinine, Ser: 0.64 mg/dL (ref 0.44–1.00)
GFR calc Af Amer: >60 mL/min
GFR calc non Af Amer: >60 mL/min
Glucose, Bld: 70 mg/dL (ref 70–99)
Potassium: 3.4 mmol/L — ABNORMAL LOW (ref 3.5–5.1)
Sodium: 139 mmol/L (ref 135–145)
Total Bilirubin: 0.3 mg/dL (ref 0.3–1.2)
Total Protein: 7.8 g/dL (ref 6.5–8.1)

## 2019-01-21 LAB — CBC WITH DIFFERENTIAL/PLATELET
Abs Immature Granulocytes: 0.01 K/uL (ref 0.00–0.07)
Basophils Absolute: 0 K/uL (ref 0.0–0.1)
Basophils Relative: 1 %
Eosinophils Absolute: 0.1 K/uL (ref 0.0–0.5)
Eosinophils Relative: 1 %
HCT: 36 % (ref 36.0–46.0)
Hemoglobin: 10.6 g/dL — ABNORMAL LOW (ref 12.0–15.0)
Immature Granulocytes: 0 %
Lymphocytes Relative: 39 %
Lymphs Abs: 1.5 K/uL (ref 0.7–4.0)
MCH: 22.9 pg — ABNORMAL LOW (ref 26.0–34.0)
MCHC: 29.4 g/dL — ABNORMAL LOW (ref 30.0–36.0)
MCV: 77.9 fL — ABNORMAL LOW (ref 80.0–100.0)
Monocytes Absolute: 0.6 K/uL (ref 0.1–1.0)
Monocytes Relative: 14 %
Neutro Abs: 1.7 K/uL (ref 1.7–7.7)
Neutrophils Relative %: 45 %
Platelets: 180 K/uL (ref 150–400)
RBC: 4.62 MIL/uL (ref 3.87–5.11)
RDW: 14.7 % (ref 11.5–15.5)
WBC: 3.9 K/uL — ABNORMAL LOW (ref 4.0–10.5)
nRBC: 0 % (ref 0.0–0.2)

## 2019-01-21 LAB — POC URINE PREG, ED: Preg Test, Ur: NEGATIVE

## 2019-01-21 LAB — SALICYLATE LEVEL: Salicylate Lvl: <7 mg/dL (ref 2.8–30.0)

## 2019-01-21 LAB — ETHANOL: Alcohol, Ethyl (B): <10 mg/dL

## 2019-01-21 LAB — ACETAMINOPHEN LEVEL: Acetaminophen (Tylenol), Serum: <10 ug/mL — ABNORMAL LOW (ref 10–30)

## 2019-01-21 MED ORDER — LORAZEPAM 1 MG PO TABS
1.0000 mg | ORAL_TABLET | Freq: Once | ORAL | Status: AC
  Administered 2019-01-22: 1 mg via ORAL
  Filled 2019-01-21: qty 1

## 2019-01-21 MED ORDER — KETOROLAC TROMETHAMINE 30 MG/ML IJ SOLN
30.0000 mg | Freq: Once | INTRAMUSCULAR | Status: AC
  Administered 2019-01-21: 30 mg via INTRAMUSCULAR
  Filled 2019-01-21: qty 1
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
## 2019-01-22 ENCOUNTER — Encounter (HOSPITAL_COMMUNITY): Payer: Self-pay | Admitting: Psychiatric/Mental Health

## 2019-01-22 ENCOUNTER — Inpatient Hospital Stay (HOSPITAL_COMMUNITY)
Admission: EM | Admit: 2019-01-22 | Discharge: 2019-01-26 | DRG: 881 | Disposition: A | Discharge status: Home / Self Care | Payer: 59 | Attending: Psychiatry | Admitting: Psychiatry

## 2019-01-22 DIAGNOSIS — Z8349 Family history of other endocrine, nutritional and metabolic diseases: Secondary | ICD-10-CM

## 2019-01-22 DIAGNOSIS — K589 Irritable bowel syndrome without diarrhea: Secondary | ICD-10-CM | POA: Diagnosis present

## 2019-01-22 DIAGNOSIS — F332 Major depressive disorder, recurrent severe without psychotic features: Secondary | ICD-10-CM | POA: Diagnosis not present

## 2019-01-22 DIAGNOSIS — F329 Major depressive disorder, single episode, unspecified: Principal | ICD-10-CM | POA: Diagnosis present

## 2019-01-22 DIAGNOSIS — F330 Major depressive disorder, recurrent, mild: Secondary | ICD-10-CM | POA: Diagnosis present

## 2019-01-22 DIAGNOSIS — R45851 Suicidal ideations: Secondary | ICD-10-CM | POA: Diagnosis present

## 2019-01-22 DIAGNOSIS — F410 Panic disorder [episodic paroxysmal anxiety] without agoraphobia: Secondary | ICD-10-CM | POA: Diagnosis present

## 2019-01-22 DIAGNOSIS — Z20828 Contact with and (suspected) exposure to other viral communicable diseases: Secondary | ICD-10-CM | POA: Diagnosis present

## 2019-01-22 DIAGNOSIS — K219 Gastro-esophageal reflux disease without esophagitis: Secondary | ICD-10-CM | POA: Diagnosis present

## 2019-01-22 DIAGNOSIS — Z8249 Family history of ischemic heart disease and other diseases of the circulatory system: Secondary | ICD-10-CM

## 2019-01-22 DIAGNOSIS — G4700 Insomnia, unspecified: Secondary | ICD-10-CM | POA: Diagnosis present

## 2019-01-22 DIAGNOSIS — F4310 Post-traumatic stress disorder, unspecified: Secondary | ICD-10-CM

## 2019-01-22 DIAGNOSIS — Z833 Family history of diabetes mellitus: Secondary | ICD-10-CM

## 2019-01-22 DIAGNOSIS — Z87891 Personal history of nicotine dependence: Secondary | ICD-10-CM | POA: Diagnosis not present

## 2019-01-22 DIAGNOSIS — F4010 Social phobia, unspecified: Secondary | ICD-10-CM | POA: Diagnosis present

## 2019-01-22 DIAGNOSIS — Z820 Family history of epilepsy and other diseases of the nervous system: Secondary | ICD-10-CM | POA: Diagnosis not present

## 2019-01-22 DIAGNOSIS — Z62810 Personal history of physical and sexual abuse in childhood: Secondary | ICD-10-CM | POA: Diagnosis present

## 2019-01-22 DIAGNOSIS — Z91030 Bee allergy status: Secondary | ICD-10-CM | POA: Diagnosis not present

## 2019-01-22 DIAGNOSIS — Z809 Family history of malignant neoplasm, unspecified: Secondary | ICD-10-CM | POA: Diagnosis not present

## 2019-01-22 DIAGNOSIS — Z880 Allergy status to penicillin: Secondary | ICD-10-CM | POA: Diagnosis not present

## 2019-01-22 DIAGNOSIS — F331 Major depressive disorder, recurrent, moderate: Secondary | ICD-10-CM | POA: Diagnosis present

## 2019-01-22 DIAGNOSIS — Z9109 Other allergy status, other than to drugs and biological substances: Secondary | ICD-10-CM

## 2019-01-22 LAB — URINALYSIS, ROUTINE W REFLEX MICROSCOPIC
Bilirubin Urine: NEGATIVE
Glucose, UA: NEGATIVE mg/dL
Hgb urine dipstick: NEGATIVE
Ketones, ur: NEGATIVE mg/dL
Leukocytes,Ua: NEGATIVE
Nitrite: NEGATIVE
Protein, ur: NEGATIVE mg/dL
Specific Gravity, Urine: 1.017 (ref 1.005–1.030)
pH: 8 (ref 5.0–8.0)

## 2019-01-22 LAB — RAPID URINE DRUG SCREEN, HOSP PERFORMED
Amphetamines: NOT DETECTED
Barbiturates: NOT DETECTED
Benzodiazepines: NOT DETECTED
Cocaine: NOT DETECTED
Opiates: NOT DETECTED
Tetrahydrocannabinol: NOT DETECTED

## 2019-01-22 LAB — SARS CORONAVIRUS 2 BY RT PCR (HOSPITAL ORDER, PERFORMED IN ~~LOC~~ HOSPITAL LAB): SARS Coronavirus 2: NEGATIVE

## 2019-01-22 MED ORDER — CITALOPRAM HYDROBROMIDE 10 MG PO TABS
10.0000 mg | ORAL_TABLET | Freq: Every day | ORAL | Status: DC
  Administered 2019-01-22 – 2019-01-24 (×3): 10 mg via ORAL
  Filled 2019-01-22 (×5): qty 1

## 2019-01-22 MED ORDER — HYDROXYZINE HCL 25 MG PO TABS: 25.0000 mg | ORAL_TABLET | Freq: Three times a day (TID) | ORAL | Status: DC | PRN

## 2019-01-22 MED ORDER — PRAZOSIN HCL 1 MG PO CAPS
1.0000 mg | ORAL_CAPSULE | Freq: Every day | ORAL | Status: DC
  Administered 2019-01-22 – 2019-01-25 (×4): 1 mg via ORAL
  Filled 2019-01-22 (×7): qty 1

## 2019-01-22 MED ORDER — LORAZEPAM 0.5 MG PO TABS
0.5000 mg | ORAL_TABLET | Freq: Four times a day (QID) | ORAL | Status: DC | PRN
  Administered 2019-01-23: 0.5 mg via ORAL
  Filled 2019-01-22: qty 1

## 2019-01-22 MED ORDER — ACETAMINOPHEN 325 MG PO TABS
650.0000 mg | ORAL_TABLET | Freq: Four times a day (QID) | ORAL | Status: DC | PRN
  Administered 2019-01-22 – 2019-01-25 (×6): 650 mg via ORAL
  Filled 2019-01-22 (×6): qty 2

## 2019-01-22 MED ORDER — MAGNESIUM HYDROXIDE 400 MG/5ML PO SUSP: 30.0000 mL | Freq: Every day | ORAL | Status: DC | PRN

## 2019-01-22 MED ORDER — ALUM & MAG HYDROXIDE-SIMETH 200-200-20 MG/5ML PO SUSP: 30.0000 mL | ORAL | Status: DC | PRN

## 2019-01-22 MED ORDER — BUPROPION HCL ER (XL) 150 MG PO TB24
150.0000 mg | ORAL_TABLET | Freq: Every day | ORAL | Status: DC
  Administered 2019-01-23 – 2019-01-26 (×4): 150 mg via ORAL
  Filled 2019-01-22 (×6): qty 1
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
## 2019-01-22 NOTE — ED Notes (Signed)
Pt was given breakfast tray 

## 2019-01-22 NOTE — [facility] Group Notes (Signed)
01/22/2019 8:45am Type of Group and Topic: Psychoeducational Group: Discharge Planning  Participation Level: Did Not Attend  Description of Group Discharge planning group reviews patient's anticipated discharge plans and assists patients to anticipate and address any barriers to wellness/recovery in the community. Suicide prevention education is reviewed with patients in group. Therapeutic Goals 1. Patients will state their anticipated discharge plan and mental health aftercare 2. Patients will identify potential barriers to wellness in the community setting 3. Patients will engage in problem solving, solution focused discussion of ways to anticipate and address barriers to wellness/recovery   Summary of Patient Progress  Plan for Discharge/Comments: Invited, chose not to attend.    Transportation Means: Supports: Therapeutic Modalities: Motivational Interviewing     Radonna Ricker, MSW, [place] Worker [facility]  Phone: [phone] 01/22/2019 2:18 PM

## 2019-01-22 NOTE — ED Notes (Signed)
Pt moved into room and is watching tv resting.
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
## 2019-01-22 NOTE — ED Provider Notes (Signed)
Patient was left at change of shift to check her urine pregnancy and urinalysis results.  Urinalysis    Component Value Date/Time   COLORURINE YELLOW 01/21/2019 2347   APPEARANCEUR CLEAR 01/21/2019 2347   APPEARANCEUR Clear 08/20/2017 1224   LABSPEC 1.017 01/21/2019 2347   PHURINE 8.0 01/21/2019 2347   GLUCOSEU NEGATIVE 01/21/2019 2347   HGBUR NEGATIVE 01/21/2019 2347   BILIRUBINUR NEGATIVE 01/21/2019 2347   BILIRUBINUR Negative 08/20/2017 [place] 01/21/2019 2347   PROTEINUR NEGATIVE 01/21/2019 2347   UROBILINOGEN 1.0 05/04/2013 0918   NITRITE NEGATIVE 01/21/2019 2347   LEUKOCYTESUR NEGATIVE 01/21/2019 2347   Urine pregnancy test negative   Rolland Porter, MD 01/22/19 [phone]

## 2019-01-22 NOTE — ED Notes (Signed)
Pt ambulated to restroom. 
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
## 2019-01-22 NOTE — ED Notes (Addendum)
Pt talking to husband on the phone.

## 2019-01-22 NOTE — ED Notes (Signed)
Pt given tv dinner and diet ginger-ale per request.
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
## 2019-01-22 NOTE — ED Notes (Signed)
Pt consulting TTS.

## 2019-01-22 NOTE — ED Notes (Signed)
TTS in progress 

## 2019-01-22 NOTE — BH Assessment (Signed)
[facility] Assessment Progress Note   Per [facility] Wynonia Hazard, patient accepted to [facility] 306-1 to Dr. Parke Poisson.  Patient can come to [facility] after 09:30.  Nurse call report to [phone],

## 2019-01-22 NOTE — ED Notes (Signed)
Pt given crackers and gingerale per request

## 2019-01-22 NOTE — ED Notes (Addendum)
Pt given blanket and pillow.
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
## 2019-01-23 LAB — HEPATIC FUNCTION PANEL
ALT: 89 U/L — ABNORMAL HIGH (ref 0–44)
AST: 61 U/L — ABNORMAL HIGH (ref 15–41)
Albumin: 4.1 g/dL (ref 3.5–5.0)
Alkaline Phosphatase: 164 U/L — ABNORMAL HIGH (ref 38–126)
Bilirubin, Direct: 0.1 mg/dL (ref 0.0–0.2)
Indirect Bilirubin: 0.2 mg/dL — ABNORMAL LOW (ref 0.3–0.9)
Total Bilirubin: 0.3 mg/dL (ref 0.3–1.2)
Total Protein: 7.8 g/dL (ref 6.5–8.1)

## 2019-01-23 LAB — URINALYSIS, COMPLETE (UACMP) WITH MICROSCOPIC
Bacteria, UA: NONE SEEN
Bilirubin Urine: NEGATIVE
Glucose, UA: NEGATIVE mg/dL
Hgb urine dipstick: NEGATIVE
Ketones, ur: NEGATIVE mg/dL
Leukocytes,Ua: NEGATIVE
Nitrite: NEGATIVE
Protein, ur: NEGATIVE mg/dL
Specific Gravity, Urine: 1.024 (ref 1.005–1.030)
pH: 5 (ref 5.0–8.0)

## 2019-01-23 LAB — RAPID URINE DRUG SCREEN, HOSP PERFORMED
Amphetamines: NOT DETECTED
Barbiturates: NOT DETECTED
Benzodiazepines: POSITIVE — AB
Cocaine: NOT DETECTED
Opiates: NOT DETECTED
Tetrahydrocannabinol: NOT DETECTED

## 2019-01-23 LAB — LIPID PANEL
Cholesterol: 168 mg/dL (ref 0–200)
HDL: 54 mg/dL (ref >40)
LDL Cholesterol: 103 mg/dL — ABNORMAL HIGH (ref 0–99)
Total CHOL/HDL Ratio: 3.1 RATIO
Triglycerides: 55 mg/dL (ref <150)
VLDL: 11 mg/dL (ref 0–40)

## 2019-01-23 LAB — HEMOGLOBIN A1C
Hgb A1c MFr Bld: 5 % (ref 4.8–5.6)
Mean Plasma Glucose: 96.8 mg/dL

## 2019-01-23 LAB — PREGNANCY, URINE: Preg Test, Ur: NEGATIVE

## 2019-01-23 LAB — TSH: TSH: 0.126 u[IU]/mL — ABNORMAL LOW (ref 0.350–4.500)

## 2019-01-23 MED ORDER — LORAZEPAM 0.5 MG PO TABS
0.5000 mg | ORAL_TABLET | Freq: Three times a day (TID) | ORAL | Status: DC | PRN
  Administered 2019-01-23 – 2019-01-26 (×5): 0.5 mg via ORAL
  Filled 2019-01-23 (×5): qty 1

## 2019-01-23 MED ORDER — RAMELTEON 8 MG PO TABS
8.0000 mg | ORAL_TABLET | Freq: Every day | ORAL | Status: DC
  Administered 2019-01-23: 21:00:00 8 mg via ORAL
  Filled 2019-01-23 (×2): qty 1

## 2019-01-23 NOTE — [facility] Group Notes (Signed)
[place] Group Notes:  (Nursing/MHT/Case Management/Adjunct)  Date:  01/23/2019  Time:  1330   Type of Therapy:  Nurse Education  Participation Level:  Active  Participation Quality:  Appropriate  Affect:  Appropriate  Cognitive:  Appropriate  Insight:  Appropriate  Engagement in Group:  Engaged  Modes of Intervention:  Discussion and Education  Summary of Progress/Problems:  Marissa Calamity 01/23/2019
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
## 2019-01-23 NOTE — Progress Notes (Signed)
D. Pt presents with a flat affect, but brightens some during interactions- per pt's self inventory, pt rates her depression, hopelessness and anxiety an 8/2/8, respectively. Pt writes that her goal today is "sleep and positive vibes, stop worrying so much and focus on my healing", and writes that she will "sleep, open up and talk instead of holding things in and self refect" Pt currently denies SI/HI and AVH   A. Labs and vitals monitored. Pt compliant with medications. Pt supported emotionally and encouraged to express concerns and ask questions.   R. Pt remains safe with 15 minute checks. Will continue POC.

## 2019-01-23 NOTE — Progress Notes (Signed)
Writer has observed patient in the dayroom briefly tonight. She had been asleep upon shift change. She reported that she has been very tired and does not sleep well at home. She became tearful when talking about this. Support given and safety maintained with 15 min checks.

## 2019-01-23 NOTE — Progress Notes (Signed)
[place] NOVEL CORONAVIRUS (COVID-19) DAILY CHECK-OFF SYMPTOMS - answer yes or no to each - every day NO YES  Have you had a fever in the past 24 hours?  . Fever (Temp > 37.80C / 100F) X   Have you had any of these symptoms in the past 24 hours? . New Cough .  Sore Throat  .  Shortness of Breath .  Difficulty Breathing .  Unexplained Body Aches   X   Have you had any one of these symptoms in the past 24 hours not related to allergies?   . Runny Nose .  Nasal Congestion .  Sneezing   X   If you have had runny nose, nasal congestion, sneezing in the past 24 hours, has it worsened?  X   EXPOSURES - check yes or no X   Have you traveled outside the state in the past 14 days?  X   Have you been in contact with someone with a confirmed diagnosis of COVID-19 or PUI in the past 14 days without wearing appropriate PPE?  X   Have you been living in the same home as a person with confirmed diagnosis of COVID-19 or a PUI (household contact)?    X   Have you been diagnosed with COVID-19?    X              What to do next: Answered NO to all: Answered YES to anything:   Proceed with unit schedule Follow the [facility] Inpatient Flowsheet.   
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
## 2019-01-23 NOTE — [facility] Group Notes (Signed)
LCSW Group Therapy Note  Date and Time: 01/23/2019 @ 10:00am  Type of Therapy and Topic: Group Therapy: Feelings Around Returning Home & Establishing a Supportive Framework and Supporting Oneself When Supports Not Available  Participation Level: [facility] PARTICIPATION LEVEL: Minimal  Mood: Pleasant    Description of Group:  Patients first processed thoughts and feelings about upcoming discharge. These included fears of upcoming changes, lack of change, new living environments, judgements and expectations from others and overall stigma of mental health issues. The group then discussed the definition of a supportive framework, what that looks and feels like, and how do to discern it from an unhealthy non-supportive network. The group identified different types of supports as well as what to do when your family/friends are less than helpful or unavailable     Therapeutic Goals   1.  Patient will identify one healthy supportive network that they can use at discharge.  2.  Patient will identify one factor of a supportive framework and how to tell it from an unhealthy network.  3.  Patient able to identify one coping skill to use when they do not have positive supports from others.  4.  Patient will demonstrate ability to communicate their needs through discussion and/or role plays.     Summary of Patient Progress:       Patient came into group late. Patient did engage in group therapy. Patient discussed her issues with people being supportive and not fully listening or understanding.      Therapeutic Modalities  Cognitive Behavioral Therapy  Motivational Interviewing   Ardelle Anton, MSW, [place]

## 2019-01-24 LAB — PROLACTIN: Prolactin: 46.5 ng/mL — ABNORMAL HIGH (ref 4.8–23.3)

## 2019-01-24 LAB — T4, FREE: Free T4: 0.75 ng/dL (ref 0.61–1.12)

## 2019-01-24 MED ORDER — MIRTAZAPINE 7.5 MG PO TABS
7.5000 mg | ORAL_TABLET | Freq: Every day | ORAL | Status: DC
  Administered 2019-01-24 – 2019-01-25 (×2): 7.5 mg via ORAL
  Filled 2019-01-24 (×4): qty 1

## 2019-01-24 MED ORDER — POLYETHYLENE GLYCOL 3350 17 G PO PACK
17.0000 g | PACK | Freq: Every day | ORAL | Status: DC
  Administered 2019-01-24 – 2019-01-25 (×2): 17 g via ORAL
  Filled 2019-01-24 (×6): qty 1

## 2019-01-24 NOTE — [facility] Group Notes (Signed)
LCSW Group Therapy Note  01/24/2019 9:09 AM  Type of Therapy/Topic: Group Therapy: Feelings about Diagnosis  Participation Level: Did Not Attend   Description of Group:  This group will allow patients to explore their thoughts and feelings about diagnoses they have received. Patients will be guided to explore their level of understanding and acceptance of these diagnoses. Facilitator will encourage patients to process their thoughts and feelings about the reactions of others to their diagnosis and will guide patients in identifying ways to discuss their diagnosis with significant others in their lives. This group will be process-oriented, with patients participating in exploration of their own experiences, giving and receiving support, and processing challenge from other group members.  Therapeutic Goals: 1. Patient will demonstrate understanding of diagnosis as evidenced by identifying two or more symptoms of the disorder 2. Patient will be able to express two feelings regarding the diagnosis 3. Patient will demonstrate their ability to communicate their needs through discussion and/or role play  Summary of Patient Progress:  Invited, did not attend.   Therapeutic Modalities:  Cognitive Behavioral Therapy Brief Therapy Feelings Identification   Stephanie Acre, MSW, [place] Social Worker

## 2019-01-24 NOTE — Progress Notes (Signed)
[place] NOVEL CORONAVIRUS (COVID-19) DAILY CHECK-OFF SYMPTOMS - answer yes or no to each - every day NO YES  Have you had a fever in the past 24 hours?  . Fever (Temp > 37.80C / 100F) X   Have you had any of these symptoms in the past 24 hours? . New Cough .  Sore Throat  .  Shortness of Breath .  Difficulty Breathing .  Unexplained Body Aches   X   Have you had any one of these symptoms in the past 24 hours not related to allergies?   . Runny Nose .  Nasal Congestion .  Sneezing   X   If you have had runny nose, nasal congestion, sneezing in the past 24 hours, has it worsened?  X   EXPOSURES - check yes or no X   Have you traveled outside the state in the past 14 days?  X   Have you been in contact with someone with a confirmed diagnosis of COVID-19 or PUI in the past 14 days without wearing appropriate PPE?  X   Have you been living in the same home as a person with confirmed diagnosis of COVID-19 or a PUI (household contact)?    X   Have you been diagnosed with COVID-19?    X              What to do next: Answered NO to all: Answered YES to anything:   Proceed with unit schedule Follow the [facility] Inpatient Flowsheet.   

## 2019-01-24 NOTE — Progress Notes (Signed)
Adult Psychoeducational Group Note  Date:  01/24/2019 Time:  12:42 AM  Group Topic/Focus:  Wrap-Up Group:   The focus of this group is to help patients review their daily goal of treatment and discuss progress on daily workbooks.  Participation Level:  Active  Participation Quality:  Appropriate  Affect:  Appropriate  Cognitive:  Appropriate  Insight: Appropriate  Engagement in Group:  Engaged  Modes of Intervention:  Discussion  Additional Comments:  Pt stated her goal was to open up more and talk to other patients.  Pt stated she did meet her goal and rated the day at a 7/10.  Corinthian Mizrahi 01/24/2019, 12:42 AM

## 2019-01-24 NOTE — Progress Notes (Signed)
[facility] MD Progress Note  01/24/2019 11:32 AM Charlotte Mccormick  MRN:  [id number] Subjective: Describes partial improvement , but does endorse ongoing depression, anxiety. States she slept poorly last night, which she states was partly because  another patient became agitated and was yelling last night, which caused her to feel " scared ".  Denies medication side effects. Denies suicidal ideations.  Objective: I have reviewed chart notes and have met with patient. 33 year old female, presented for worsening depression, SI, neuro-vegetative symptoms and a recent severe panic attack. Reports significant stressors and being off her antidepressant medications x several weeks.   Patient presents with a partially improved mood and range of affect, but still presents depressed, constricted . Affect remains constricted, but becomes more reactive and animated as session progresses . She is also presenting with improving eye contact and a partially improved rate of speech. Denies medication side effects. As above, reports she felt apprehensive after another patient had an episode of agitation last night- I provided reassurance, validation and reviewed that this is a safe setting with staff available to patients 24/7 . No agitated behaviors, polite on approach. Some group participation, but in general interaction with peers has been limited . Reports persistent poor/limited sleep. We discussed options , including Remeron trial for depression, anxiety , insomnia.   Principal Problem: MDD, PTSD, consider Social Phobia Diagnosis: Active Problems:   MDD (major depressive disorder)  Total Time spent with patient: 20 minutes  Past Psychiatric History:   Past Medical History:  Past Medical History:  Diagnosis Date  . Abnormal uterine bleeding (AUB) 12/12/2015  . Anemia   . Anxiety   . Asthma   . Constipation   . Depression   . Fibroid   . Genital herpes   . GERD (gastroesophageal reflux disease)   .  History of chlamydia   . Hyperemesis   . IBS (irritable bowel syndrome)   . Insomnia   . Mental disorder    PTSD, ANXIETY,Depression  . PTSD (post-traumatic stress disorder)   . Sinusitis   . Vaginal dryness 12/12/2015    Past Surgical History:  Procedure Laterality Date  . COLONOSCOPY     2016  . COLONOSCOPY N/A 04/08/2016   Procedure: COLONOSCOPY;  Surgeon: Danie Binder, MD;  [facility]: [facility];  Service: Endoscopy;  Laterality: N/A;  10:15 AM  . ESOPHAGOGASTRODUODENOSCOPY N/A 04/08/2016   Procedure: ESOPHAGOGASTRODUODENOSCOPY (EGD);  Surgeon: Danie Binder, MD;  Location: [facility];  Service: Endoscopy;  Laterality: N/A;  . PERINEUM REPAIR    . UPPER GASTROINTESTINAL ENDOSCOPY  2016   Family History:  Family History  Problem Relation Age of Onset  . Heart disease Mother   . Hypertension Mother   . Hypertension Father   . Hyperlipidemia Father   . Diabetes Father   . Cancer Father   . Epilepsy Brother   . Eczema Daughter   . Colon cancer Neg Hx    Family Psychiatric  History:  Social History:  Social History   Substance and Sexual Activity  Alcohol Use Yes  . Alcohol/week: 2.0 standard drinks  . Types: 2 Glasses of wine per week  . Frequency: Never   Comment: occ wine     Social History   Substance and Sexual Activity  Drug Use Yes  . Types: Marijuana    Social History   Socioeconomic History  . Marital status: Married    Spouse name: Not on file  . Number of children: Not on  file  . Years of education: Not on file  . Highest education level: Not on file  Occupational History  . Not on file  Social Needs  . Financial resource strain: Not on file  . Food insecurity    Worry: Not on file    Inability: Not on file  . Transportation needs    Medical: Not on file    Non-medical: Not on file  Tobacco Use  . Smoking status: Former Smoker    Packs/day: 0.50    Years: 5.00    Pack years: 2.50    Types: Cigarettes    Quit date: 06/03/2010     Years since quitting: 8.6  . Smokeless tobacco: Never Used  Substance and Sexual Activity  . Alcohol use: Yes    Alcohol/week: 2.0 standard drinks    Types: 2 Glasses of wine per week    Frequency: Never    Comment: occ wine  . Drug use: Yes    Types: Marijuana  . Sexual activity: Yes    Birth control/protection: None  Lifestyle  . Physical activity    Days per week: Not on file    Minutes per session: Not on file  . Stress: Not on file  Relationships  . Social Herbalist on phone: Not on file    Gets together: Not on file    Attends religious service: Not on file    Active member of club or organization: Not on file    Attends meetings of clubs or organizations: Not on file    Relationship status: Not on file  Other Topics Concern  . Not on file  Social History Narrative   ** Merged History Encounter **       Additional Social History:   Sleep: improving  Appetite:  Fair  Current Medications: Current Facility-Administered Medications  Medication Dose Route Frequency Provider Last Rate Last Dose  . acetaminophen (TYLENOL) tablet 650 mg  650 mg Oral Q6H PRN Deloria Lair, NP   650 mg at 01/24/19 9371  . alum & mag hydroxide-simeth (MAALOX/MYLANTA) 200-200-20 MG/5ML suspension 30 mL  30 mL Oral Q4H PRN Dixon, Rashaun M, NP      . buPROPion (WELLBUTRIN XL) 24 hr tablet 150 mg  150 mg Oral Daily , Myer Peer, MD   150 mg at 01/24/19 0751  . LORazepam (ATIVAN) tablet 0.5 mg  0.5 mg Oral Q8H PRN , Myer Peer, MD   0.5 mg at 01/24/19 0754  . magnesium hydroxide (MILK OF MAGNESIA) suspension 30 mL  30 mL Oral Daily PRN Doren Custard, Rashaun M, NP      . mirtazapine (REMERON) tablet 7.5 mg  7.5 mg Oral QHS ,  A, MD      . prazosin (MINIPRESS) capsule 1 mg  1 mg Oral QHS , Myer Peer, MD   1 mg at 01/23/19 2059    Lab Results:  Results for orders placed or performed during the hospital encounter of 01/22/19 (from the past 48 hour(s))   Pregnancy, urine     Status: None   Collection Time: 01/23/19  6:00 AM  Result Value Ref Range   Preg Test, Ur NEGATIVE NEGATIVE    Comment: Performed at [facility], [place] [address], [place], [place] [address]  Urine rapid drug screen (hosp performed)not at [facility]     Status: Abnormal   Collection Time: 01/23/19  6:00 AM  Result Value Ref Range   Opiates NONE DETECTED NONE DETECTED  Cocaine NONE DETECTED NONE DETECTED   Benzodiazepines POSITIVE (A) NONE DETECTED   Amphetamines NONE DETECTED NONE DETECTED   Tetrahydrocannabinol NONE DETECTED NONE DETECTED   Barbiturates NONE DETECTED NONE DETECTED    Comment: (NOTE) DRUG SCREEN FOR MEDICAL PURPOSES ONLY.  IF CONFIRMATION IS NEEDED FOR ANY PURPOSE, NOTIFY LAB WITHIN 5 DAYS. LOWEST DETECTABLE LIMITS FOR URINE DRUG SCREEN Drug Class                     Cutoff (ng/mL) Amphetamine and metabolites    1000 Barbiturate and metabolites    200 Benzodiazepine                 751 Tricyclics and metabolites     300 Opiates and metabolites        300 Cocaine and metabolites        300 THC                            50 Performed at [facility], [place] [address] [place], [place] [address]   Urinalysis, Complete w Microscopic     Status: None   Collection Time: 01/23/19  6:00 AM  Result Value Ref Range   Color, Urine YELLOW YELLOW   APPearance CLEAR CLEAR   Specific Gravity, Urine 1.024 1.005 - 1.030   pH 5.0 5.0 - 8.0   Glucose, UA NEGATIVE NEGATIVE mg/dL   Hgb urine dipstick NEGATIVE NEGATIVE   Bilirubin Urine NEGATIVE NEGATIVE   Ketones, ur NEGATIVE NEGATIVE mg/dL   Protein, ur NEGATIVE NEGATIVE mg/dL   Nitrite NEGATIVE NEGATIVE   Leukocytes,Ua NEGATIVE NEGATIVE   RBC / HPF 0-5 0 - 5 RBC/hpf   WBC, UA 0-5 0 - 5 WBC/hpf   Bacteria, UA NONE SEEN NONE SEEN   Squamous Epithelial / LPF 0-5 0 - 5   Mucus PRESENT     Comment: Performed at [facility], [place] [address],  [place], [place] [address]  Hemoglobin A1c     Status: None   Collection Time: 01/23/19  6:23 AM  Result Value Ref Range   Hgb A1c MFr Bld 5.0 4.8 - 5.6 %    Comment: (NOTE) Pre diabetes:          5.7%-6.4% Diabetes:              >6.4% Glycemic control for   <7.0% adults with diabetes    Mean Plasma Glucose 96.8 mg/dL    Comment: Performed at [place] [address] [place], [place] [address]  Lipid panel     Status: Abnormal   Collection Time: 01/23/19  6:23 AM  Result Value Ref Range   Cholesterol 168 0 - 200 mg/dL   Triglycerides 55 <150 mg/dL   HDL 54 >40 mg/dL   Total CHOL/HDL Ratio 3.1 RATIO   VLDL 11 0 - 40 mg/dL   LDL Cholesterol 103 (H) 0 - 99 mg/dL    Comment:        Total Cholesterol/HDL:CHD Risk Coronary Heart Disease Risk Table                     Men   Women  1/2 Average Risk   3.4   3.3  Average Risk       5.0   4.4  2 X Average Risk   9.6   7.1  3 X Average Risk  23.4  11.0        Use the calculated Patient Ratio above and the CHD Risk Table to determine the patient's CHD Risk.        ATP III CLASSIFICATION (LDL):  <100     mg/dL   Optimal  100-129  mg/dL   Near or Above                    Optimal  130-159  mg/dL   Borderline  160-189  mg/dL   High  >190     mg/dL   Very High Performed at [place] [address], [place], [place] [address]   Hepatic function panel     Status: Abnormal   Collection Time: 01/23/19  6:23 AM  Result Value Ref Range   Total Protein 7.8 6.5 - 8.1 g/dL   Albumin 4.1 3.5 - 5.0 g/dL   AST 61 (H) 15 - 41 U/L   ALT 89 (H) 0 - 44 U/L   Alkaline Phosphatase 164 (H) 38 - 126 U/L   Total Bilirubin 0.3 0.3 - 1.2 mg/dL   Bilirubin, Direct 0.1 0.0 - 0.2 mg/dL   Indirect Bilirubin 0.2 (L) 0.3 - 0.9 mg/dL    Comment: Performed at [facility], [place] [address] [place], [place] [address]  TSH     Status: Abnormal   Collection Time: 01/23/19  6:23 AM  Result Value Ref Range   TSH  0.126 (L) 0.350 - 4.500 uIU/mL    Comment: Performed by a 3rd Generation assay with a functional sensitivity of <=0.01 uIU/mL. Performed at [facility], [place] [address], [place], [place] [address]   Prolactin     Status: Abnormal   Collection Time: 01/23/19  6:23 AM  Result Value Ref Range   Prolactin 46.5 (H) 4.8 - 23.3 ng/mL    Comment: (NOTE) Performed At: [facility] [place], [place] [id number] Rush Farmer MD OV:[id number]     Blood Alcohol level:  Lab Results  Component Value Date   [facility] <10 84/16/6063    Metabolic Disorder Labs: Lab Results  Component Value Date   HGBA1C 5.0 01/23/2019   MPG 96.8 01/23/2019   Lab Results  Component Value Date   PROLACTIN 46.5 (H) 01/23/2019   Lab Results  Component Value Date   CHOL 168 01/23/2019   TRIG 55 01/23/2019   HDL 54 01/23/2019   CHOLHDL 3.1 01/23/2019   VLDL 11 01/23/2019   LDLCALC 103 (H) 01/23/2019    Physical Findings: AIMS: Facial and Oral Movements Muscles of Facial Expression: None, normal Lips and Perioral Area: None, normal Jaw: None, normal Tongue: None, normal,Extremity Movements Upper (arms, wrists, hands, fingers): None, normal Lower (legs, knees, ankles, toes): None, normal, Trunk Movements Neck, shoulders, hips: None, normal, Overall Severity Severity of abnormal movements (highest score from questions above): None, normal Incapacitation due to abnormal movements: None, normal Patient's awareness of abnormal movements (rate only patient's report): No Awareness, Dental Status Current problems with teeth and/or dentures?: No Does patient usually wear dentures?: No  CIWA:    COWS:     Musculoskeletal: Strength & Muscle Tone: within normal limits Gait & Station: normal Patient leans: N/A  Psychiatric Specialty Exam: Physical Exam  ROS no chest pain, no shortness of breath, no vomiting, no fever, no chills  Blood pressure (!) 117/96, pulse (!) 131,  temperature 98.4 F (36.9 C), resp. rate 20, height '5\' 2"'$  (1.575 m), weight 79.8 kg,  SpO2 100 %, not currently breastfeeding.Body mass index is 32.19 kg/m.  Repeat vitals : 125/86, pulse 91  General Appearance: Fairly Groomed  Eye Contact:  Fair  Speech:  Normal Rate  Volume:  Normal  Mood:  remains depressed, some improvement compared to admission  Affect:  constricted, does smile briefly at times during session  Thought Process:  Linear and Descriptions of Associations: Intact  Orientation:  Other:  Fully alert and attentive  Thought Content:  Currently denies hallucinations, no delusions are expressed, does not appear internally preoccupied  Suicidal Thoughts:  No at this time denies suicidal plan or intention and contracts for safety on unit, also denies homicidal or violent ideations  Homicidal Thoughts:  No  Memory:  Recent and remote grossly intact  Judgement:  Other:  Improving  Insight: Fair/ Improving  Psychomotor Activity:  improving , more visible in day room  Concentration:  Concentration: Good and Attention Span: Good  Recall:  Good  Fund of Knowledge:  Good  Language:  Fair  Akathisia:  Negative  Handed:  Right  AIMS (if indicated):     Assets:  Desire for Improvement Resilience  ADL's:  Intact  Cognition:  WNL  Sleep:  Number of Hours: 6.5   Assessment:  33 year old female, presented for worsening depression, SI, neuro-vegetative symptoms and a recent severe panic attack. Reports significant stressors and being off her antidepressant medications x several weeks.    Patient reports partial improvement compared to admission. She remains depressed, with a still constricted but gradually more reactive range of affect. No panic attacks since admission but remains vaguely anxious and reports sense of apprehension after a peer had an episode of agitation last night. Complains of ongoing insomnia. We discussed options, and expresses interest in Remeron, which may be helpful  for both depression and insomnia. Side effects reviewed.   Treatment Plan Summary: Daily contact with patient to assess and evaluate symptoms and progress in treatment, Medication management, Plan Inpatient treatment and Medications as below  Treatment Plan reviewed as below today 8/23.  Encourage group and milieu participation Treatment team working on disposition planning options Continue Wellbutrin XL 150 mg daily for depression D/C Celexa For now discontinue Rozerem Start Remeron 7.5 mgrs QHS for depression, insomnia, anxiety Continue Minipress 1 mg nightly for nightmares/insomnia Continue Ativan 0.5 mg every 8 hours PRN for anxiety Labs pending  Jenne Campus, MD 01/24/2019, 11:32 AM   Patient ID: Charlotte Mccormick, female   DOB: 04-16-1986, 33 y.o.   MRN: [id number]

## 2019-01-24 NOTE — Progress Notes (Signed)
D.  Pt forwards little on approach, brief interaction.  Pt states she slept less than two hours last night and did not sleep any during day.  Pt hopeful to get better rest tonight.  Pt was positive for evening wrap up group, observed engaged in minimal but appropriate interaction with peers on unit.  A.  Support and encouragement offered, medication given as ordered.  R.  Pt remains safe on the unit, will continue to monitor.

## 2019-01-24 NOTE — Progress Notes (Signed)
D.  Pt appears brighter on approach this evening, speaks about having to get home and get her children "back on schedule".  States husband has been letting them stay up past midnight.  Pt appears pleasant and relaxed during conversation.  Pt was positive for evening wrap up group, has been appropriately engaged with peers on the unit.  Pt denies SI/HI/AVH at this time.  A.  Support and encouragement offered, medication given as ordered.  R.  Pt remains safe on the unit, will continue to monitor.

## 2019-01-24 NOTE — Progress Notes (Signed)
D. Pt presents with flat affect, calm cooperative behavior- visible in the milieu journaling. Per pt's self inventory, pt rated her depression, hopelessness and anxiety a 6/2/8, respectively. Pt reports that she misses her kids. Pt writes that her most important goal to work on today is "get rest, lower anxiety so I can sleep, open up more and journal". Pt currently denies SI/HI and AVH   A. Labs and vitals monitored. Pt compliant with medications. Pt supported emotionally and encouraged to express concerns and ask questions.   R. Pt remains safe with 15 minute checks. Will continue POC.

## 2019-01-25 LAB — T3, FREE: T3, Free: 3.6 pg/mL (ref 2.0–4.4)

## 2019-01-25 NOTE — Tx Team (Signed)
Interdisciplinary Treatment and Diagnostic Plan Update  01/25/2019 Time of Session: 10:40am Charlotte Mccormick MRN: [id number]  Principal Diagnosis: <principal problem not specified>  Secondary Diagnoses: Active Problems:   MDD (major depressive disorder)   Current Medications:  Current Facility-Administered Medications  Medication Dose Route Frequency Provider Last Rate Last Dose  . acetaminophen (TYLENOL) tablet 650 mg  650 mg Oral Q6H PRN Deloria Lair, NP   650 mg at 01/24/19 [id number]  . alum & mag hydroxide-simeth (MAALOX/MYLANTA) 200-200-20 MG/5ML suspension 30 mL  30 mL Oral Q4H PRN Dixon, Rashaun M, NP      . buPROPion (WELLBUTRIN XL) 24 hr tablet 150 mg  150 mg Oral Daily Cobos, Myer Peer, MD   150 mg at 01/25/19 0820  . LORazepam (ATIVAN) tablet 0.5 mg  0.5 mg Oral Q8H PRN Cobos, Myer Peer, MD   0.5 mg at 01/24/19 2108  . magnesium hydroxide (MILK OF MAGNESIA) suspension 30 mL  30 mL Oral Daily PRN Dixon, Rashaun M, NP      . mirtazapine (REMERON) tablet 7.5 mg  7.5 mg Oral QHS Cobos, Myer Peer, MD   7.5 mg at 01/24/19 2107  . polyethylene glycol (MIRALAX / GLYCOLAX) packet 17 g  17 g Oral Daily Derrill Center, NP   17 g at 01/25/19 0820  . prazosin (MINIPRESS) capsule 1 mg  1 mg Oral QHS Cobos, Myer Peer, MD   1 mg at 01/24/19 2108   PTA Medications: Medications Prior to Admission  Medication Sig Dispense Refill Last Dose  . albuterol (PROVENTIL HFA;VENTOLIN HFA) 108 (90 Base) MCG/ACT inhaler INHALE 2 PUFFS BY MOUTH EVERY 6 HOURS AS NEEDED FOR WHEEZING OR SHORTNESS OF BREATH 8.5 g 3 Past Month at Unknown time  . rizatriptan (MAXALT) 10 MG tablet Take 10 mg by mouth as needed for migraine. May repeat in 2 hours if needed   Past Month at Unknown time    Patient Stressors: Financial difficulties Marital or family conflict Medication change or noncompliance Occupational concerns Traumatic event  Patient Strengths: Average or above average intelligence Capable of  independent living General fund of knowledge Physical Health Supportive family/friends Work skills  Treatment Modalities: Medication Management, Group therapy, Case management,  1 to 1 session with clinician, Psychoeducation, Recreational therapy.   Physician Treatment Plan for Primary Diagnosis: <principal problem not specified> Long Term Goal(s): Improvement in symptoms so as ready for discharge Improvement in symptoms so as ready for discharge   Short Term Goals: Ability to identify changes in lifestyle to reduce recurrence of condition will improve Ability to verbalize feelings will improve Ability to disclose and discuss suicidal ideas Ability to demonstrate self-control will improve Ability to identify and develop effective coping behaviors will improve Ability to maintain clinical measurements within normal limits will improve Ability to identify changes in lifestyle to reduce recurrence of condition will improve Ability to verbalize feelings will improve Ability to disclose and discuss suicidal ideas Ability to demonstrate self-control will improve Ability to identify and develop effective coping behaviors will improve Ability to maintain clinical measurements within normal limits will improve  Medication Management: Evaluate patient's response, side effects, and tolerance of medication regimen.  Therapeutic Interventions: 1 to 1 sessions, Unit Group sessions and Medication administration.  Evaluation of Outcomes: Progressing  Physician Treatment Plan for Secondary Diagnosis: Active Problems:   MDD (major depressive disorder)  Long Term Goal(s): Improvement in symptoms so as ready for discharge Improvement in symptoms so as ready for discharge   Short Term Goals:  Ability to identify changes in lifestyle to reduce recurrence of condition will improve Ability to verbalize feelings will improve Ability to disclose and discuss suicidal ideas Ability to demonstrate  self-control will improve Ability to identify and develop effective coping behaviors will improve Ability to maintain clinical measurements within normal limits will improve Ability to identify changes in lifestyle to reduce recurrence of condition will improve Ability to verbalize feelings will improve Ability to disclose and discuss suicidal ideas Ability to demonstrate self-control will improve Ability to identify and develop effective coping behaviors will improve Ability to maintain clinical measurements within normal limits will improve     Medication Management: Evaluate patient's response, side effects, and tolerance of medication regimen.  Therapeutic Interventions: 1 to 1 sessions, Unit Group sessions and Medication administration.  Evaluation of Outcomes: Progressing   RN Treatment Plan for Primary Diagnosis: <principal problem not specified> Long Term Goal(s): Knowledge of disease and therapeutic regimen to maintain health will improve  Short Term Goals: Ability to participate in decision making will improve, Ability to verbalize feelings will improve, Ability to disclose and discuss suicidal ideas, Ability to identify and develop effective coping behaviors will improve and Compliance with prescribed medications will improve  Medication Management: RN will administer medications as ordered by provider, will assess and evaluate patient's response and provide education to patient for prescribed medication. RN will report any adverse and/or side effects to prescribing provider.  Therapeutic Interventions: 1 on 1 counseling sessions, Psychoeducation, Medication administration, Evaluate responses to treatment, Monitor vital signs and CBGs as ordered, Perform/monitor CIWA, COWS, AIMS and Fall Risk screenings as ordered, Perform wound care treatments as ordered.  Evaluation of Outcomes: Progressing   LCSW Treatment Plan for Primary Diagnosis: <principal problem not specified> Long  Term Goal(s): Safe transition to appropriate next level of care at discharge, Engage patient in therapeutic group addressing interpersonal concerns.  Short Term Goals: Engage patient in aftercare planning with referrals and resources and Increase skills for wellness and recovery  Therapeutic Interventions: Assess for all discharge needs, 1 to 1 time with Social worker, Explore available resources and support systems, Assess for adequacy in community support network, Educate family and significant other(s) on suicide prevention, Complete Psychosocial Assessment, Interpersonal group therapy.  Evaluation of Outcomes: Progressing   Progress in Treatment: Attending groups: Yes. Participating in groups: Yes. Taking medication as prescribed: Yes. Toleration medication: Yes. Family/Significant other contact made: No, will contact:  pt's husband Patient understands diagnosis: Yes. Discussing patient identified problems/goals with staff: Yes. Medical problems stabilized or resolved: Yes. Denies suicidal/homicidal ideation: Yes. Issues/concerns per patient self-inventory: No. Other:   New problem(s) identified: No, Describe:  None  New Short Term/Long Term Goal(s): Medication stabilization, elimination of SI thoughts, and development of a comprehensive mental wellness plan.   Patient Goals:  "Learn how to deal with stressors in my life. Open yup more to people and talk more to people"  Discharge Plan or Barriers: CSW will continue to follow up for appropriate referrals and possible discharge planning  Reason for Continuation of Hospitalization: Anxiety Depression Medication stabilization  Estimated Length of Stay: 2-3 days   Attendees: Patient: Charlotte Mccormick  01/25/2019  Physician:  01/25/2019  Nurse Practitioner: Marcie Bal, NP 01/25/2019   Nursing: Legrand Como, RN 01/25/2019   RN Care Manager: 01/25/2019  Social Worker: Ardelle Anton, LCSW 01/25/2019   Recreational Therapist:  01/25/2019   Other:  01/25/2019   Other:  01/25/2019   Other: 01/25/2019      Scribe for Treatment Team: Benjaman Pott  Duayne Cal 01/25/2019 12:48 PM

## 2019-01-25 NOTE — Progress Notes (Signed)
D:  Patient's self inventory sheet, patient sleeps good, sleep medication helpful.  Good appetite, normal energy level, fair concentration.  Rated depression 4, hopeless 1, anxiety 6.  Denied withdrawals.  Denied SI.  Physical problems, pain, back, worst pain #7, taking tylenol.  Goal is be positive, journal.  Does have discharge plan. A:  Medications administered per MD orders.  Emotional support and encouragement given patient. R:  Denied SI and HI, contracts for safety.  Denied A/V hallucinations.  Safety maintained with 15 minute checks.

## 2019-01-25 NOTE — Plan of Care (Signed)
Nurse discussed anxiety, depression and coping skills with patient.  

## 2019-01-25 NOTE — Progress Notes (Signed)
Adult Psychoeducational Group Note  Date:  01/25/2019 Time:  10:05 PM  Group Topic/Focus:  Wrap-Up Group:   The focus of this group is to help patients review their daily goal of treatment and discuss progress on daily workbooks.  Participation Level:  Active  Participation Quality:  Appropriate  Affect:  Appropriate  Cognitive:  Appropriate  Insight: Appropriate  Engagement in Group:  Engaged  Modes of Intervention:  Discussion  Additional Comments:  Pt stated her goal was to have a good visit, journal more and be more active with other patients.  Pt rated the day at a 8/10.  Dorraine Ellender 01/25/2019, 10:05 PM

## 2019-01-25 NOTE — Progress Notes (Signed)
[facility] MD Progress Note  01/25/2019 2:40 PM Charlotte Mccormick  MRN:  [id number] Subjective: Patient reports partial improvement.  Today denies suicidal ideations.  Currently denies medication side effects. Objective: I have reviewed case with treatment team and have met with patient. 33 year old female, presented for worsening depression, SI, neuro-vegetative symptoms and a recent severe panic attack. Reports significant stressors and being off her antidepressant medications x several weeks.   She describes some improvement compared to admission and acknowledges improving mood.  She does present with a noticeably improved range of affect and currently presents with improved eye contact/more reactive affect/better related overall.  Rate of speech is also noted to be improved. Currently denies suicidal or self-injurious ideations. Denies medication side effects. She has been more visible in dayroom/more interactive with peers. TSH 0.126- FT3 3.6, FT4 0.75 ( WNL)   Principal Problem: MDD, PTSD, consider Social Phobia Diagnosis: Active Problems:   MDD (major depressive disorder)  Total Time spent with patient: 20 minutes  Past Psychiatric History:   Past Medical History:  Past Medical History:  Diagnosis Date  . Abnormal uterine bleeding (AUB) 12/12/2015  . Anemia   . Anxiety   . Asthma   . Constipation   . Depression   . Fibroid   . Genital herpes   . GERD (gastroesophageal reflux disease)   . History of chlamydia   . Hyperemesis   . IBS (irritable bowel syndrome)   . Insomnia   . Mental disorder    PTSD, ANXIETY,Depression  . PTSD (post-traumatic stress disorder)   . Sinusitis   . Vaginal dryness 12/12/2015    Past Surgical History:  Procedure Laterality Date  . COLONOSCOPY     2016  . COLONOSCOPY N/A 04/08/2016   Procedure: COLONOSCOPY;  Surgeon: Danie Binder, MD;  [facility];  Service: Endoscopy;  Laterality: N/A;  10:15 AM  . ESOPHAGOGASTRODUODENOSCOPY  N/A 04/08/2016   Procedure: ESOPHAGOGASTRODUODENOSCOPY (EGD);  Surgeon: Danie Binder, MD;  Location: [facility];  Service: Endoscopy;  Laterality: N/A;  . PERINEUM REPAIR    . UPPER GASTROINTESTINAL ENDOSCOPY  2016   Family History:  Family History  Problem Relation Age of Onset  . Heart disease Mother   . Hypertension Mother   . Hypertension Father   . Hyperlipidemia Father   . Diabetes Father   . Cancer Father   . Epilepsy Brother   . Eczema Daughter   . Colon cancer Neg Hx    Family Psychiatric  History:  Social History:  Social History   Substance and Sexual Activity  Alcohol Use Yes  . Alcohol/week: 2.0 standard drinks  . Types: 2 Glasses of wine per week  . Frequency: Never   Comment: occ wine     Social History   Substance and Sexual Activity  Drug Use Yes  . Types: Marijuana    Social History   Socioeconomic History  . Marital status: Married    Spouse name: Not on file  . Number of children: Not on file  . Years of education: Not on file  . Highest education level: Not on file  Occupational History  . Not on file  Social Needs  . Financial resource strain: Not on file  . Food insecurity    Worry: Not on file    Inability: Not on file  . Transportation needs    Medical: Not on file    Non-medical: Not on file  Tobacco Use  . Smoking status: Former  Smoker    Packs/day: 0.50    Years: 5.00    Pack years: 2.50    Types: Cigarettes    Quit date: 06/03/2010    Years since quitting: 8.6  . Smokeless tobacco: Never Used  Substance and Sexual Activity  . Alcohol use: Yes    Alcohol/week: 2.0 standard drinks    Types: 2 Glasses of wine per week    Frequency: Never    Comment: occ wine  . Drug use: Yes    Types: Marijuana  . Sexual activity: Yes    Birth control/protection: None  Lifestyle  . Physical activity    Days per week: Not on file    Minutes per session: Not on file  . Stress: Not on file  Relationships  . Social Product manager on phone: Not on file    Gets together: Not on file    Attends religious service: Not on file    Active member of club or organization: Not on file    Attends meetings of clubs or organizations: Not on file    Relationship status: Not on file  Other Topics Concern  . Not on file  Social History Narrative   ** Merged History Encounter **       Additional Social History:   Sleep: reports she is sleeping better and did not have nightmares last night  Appetite:  Improving  Current Medications: Current Facility-Administered Medications  Medication Dose Route Frequency Provider Last Rate Last Dose  . acetaminophen (TYLENOL) tablet 650 mg  650 mg Oral Q6H PRN Deloria Lair, NP   650 mg at 01/24/19 1025  . alum & mag hydroxide-simeth (MAALOX/MYLANTA) 200-200-20 MG/5ML suspension 30 mL  30 mL Oral Q4H PRN Dixon, Rashaun M, NP      . buPROPion (WELLBUTRIN XL) 24 hr tablet 150 mg  150 mg Oral Daily Cobos, Myer Peer, MD   150 mg at 01/25/19 0820  . LORazepam (ATIVAN) tablet 0.5 mg  0.5 mg Oral Q8H PRN Cobos, Myer Peer, MD   0.5 mg at 01/24/19 2108  . magnesium hydroxide (MILK OF MAGNESIA) suspension 30 mL  30 mL Oral Daily PRN Dixon, Rashaun M, NP      . mirtazapine (REMERON) tablet 7.5 mg  7.5 mg Oral QHS Cobos, Myer Peer, MD   7.5 mg at 01/24/19 2107  . polyethylene glycol (MIRALAX / GLYCOLAX) packet 17 g  17 g Oral Daily Derrill Center, NP   17 g at 01/25/19 0820  . prazosin (MINIPRESS) capsule 1 mg  1 mg Oral QHS Cobos, Myer Peer, MD   1 mg at 01/24/19 2108    Lab Results:  Results for orders placed or performed during the hospital encounter of 01/22/19 (from the past 48 hour(s))  T3, free     Status: None   Collection Time: 01/24/19  7:10 AM  Result Value Ref Range   T3, Free 3.6 2.0 - 4.4 pg/mL    Comment: (NOTE) Performed At: [facility] [place], [place] [id number] Rush Farmer MD [id number]   T4, free     Status: None   Collection  Time: 01/24/19  7:10 AM  Result Value Ref Range   Free T4 0.75 0.61 - 1.12 ng/dL    Comment: (NOTE) Biotin ingestion may interfere with free T4 tests. If the results are inconsistent with the TSH level, previous test results, or the clinical presentation, then consider biotin interference. If needed, order repeat testing  after stopping biotin. Performed at [facility], [place] [address], [place], [place] [address]     Blood Alcohol level:  Lab Results  Component Value Date   ETH <10 32/67/1245    Metabolic Disorder Labs: Lab Results  Component Value Date   HGBA1C 5.0 01/23/2019   MPG 96.8 01/23/2019   Lab Results  Component Value Date   PROLACTIN 46.5 (H) 01/23/2019   Lab Results  Component Value Date   CHOL 168 01/23/2019   TRIG 55 01/23/2019   HDL 54 01/23/2019   CHOLHDL 3.1 01/23/2019   VLDL 11 01/23/2019   LDLCALC 103 (H) 01/23/2019    Physical Findings: AIMS: Facial and Oral Movements Muscles of Facial Expression: None, normal Lips and Perioral Area: None, normal Jaw: None, normal Tongue: None, normal,Extremity Movements Upper (arms, wrists, hands, fingers): None, normal Lower (legs, knees, ankles, toes): None, normal, Trunk Movements Neck, shoulders, hips: None, normal, Overall Severity Severity of abnormal movements (highest score from questions above): None, normal Incapacitation due to abnormal movements: None, normal Patient's awareness of abnormal movements (rate only patient's report): No Awareness, Dental Status Current problems with teeth and/or dentures?: No Does patient usually wear dentures?: No  CIWA:    COWS:     Musculoskeletal: Strength & Muscle Tone: within normal limits Gait & Station: normal Patient leans: N/A  Psychiatric Specialty Exam: Physical Exam  ROS no chest pain, no shortness of breath, no vomiting, no fever, no chills  Blood pressure (!) 127/91, pulse (!) 105, temperature 98.5 F (36.9 C), resp. rate 20, height 5'  2" (1.575 m), weight 79.8 kg, SpO2 100 %, not currently breastfeeding.Body mass index is 32.19 kg/m.  Repeat vitals : 125/86, pulse 91  General Appearance: Improving  grooming  Eye Contact:  Improving eye contact  Speech:  Normal Rate  Volume:  Normal  Mood:  Partially improved mood, affect is noted to be more reactive  Affect:  Becoming more reactive, smiles at times appropriately during session  Thought Process:  Linear and Descriptions of Associations: Intact  Orientation:  Other:  Fully alert and attentive  Thought Content:  Currently denies hallucinations, no delusions are expressed, does not appear internally preoccupied  Suicidal Thoughts:  No at this time denies suicidal plan or intention and contracts for safety on unit, also denies homicidal or violent ideations  Homicidal Thoughts:  No  Memory:  Recent and remote grossly intact  Judgement:  Other:  Improving  Insight: Fair/ Improving  Psychomotor Activity:  improving , more visible in day room  Concentration:  Concentration: Good and Attention Span: Good  Recall:  Good  Fund of Knowledge:  Good  Language:  Fair  Akathisia:  Negative  Handed:  Right  AIMS (if indicated):     Assets:  Desire for Improvement Resilience  ADL's:  Intact  Cognition:  WNL  Sleep:  Number of Hours: 6.75   Assessment:  33 year old female, presented for worsening depression, SI, neuro-vegetative symptoms and a recent severe panic attack. Reports significant stressors and being off her antidepressant medications x several weeks.    Partial but noticeable improvement compared to admission presentation.  She is noted to be presenting with improving eye contact, a more reactive affect, improved rate of speech.  Reports she slept better last night.  Currently denies suicidal ideations.  Tolerating medications well thus far (on Wellbutrin XL and Remeron).  Treatment Plan Summary: Daily contact with patient to assess and evaluate symptoms and progress  in treatment,  Medication management, Plan Inpatient treatment and Medications as below  Treatment Plan reviewed as below today 8/23.  Encourage group and milieu participation Treatment team working on disposition planning options Continue Wellbutrin XL 150 mg daily for depression Continue Remeron 7.5 mgrs QHS for depression, insomnia, anxiety Continue Minipress 1 mg nightly for nightmares/insomnia Continue Ativan 0.5 mg every 8 hours PRN for anxiety Labs pending (Hep C/ Hep B serologies ordered due to elevated transaminases )  Jenne Campus, MD 01/25/2019, 2:40 PM   Patient ID: Charlotte Mccormick, female   DOB: 1985/11/22, 33 y.o.   MRN: [id number]

## 2019-01-25 NOTE — Progress Notes (Signed)
Recreation Therapy Notes  Date:  8.24.20 Time: 0930 Location: 300 Hall Dayroom  Group Topic: Stress Management  Goal Area(s) Addresses:  Patient will identify positive stress management techniques. Patient will identify benefits of using stress management post d/c.  Intervention: Stress Management  Activity :  Meditation.  LRT played a meditation that focused on making the most of your day.  Patients were to listen and follow along as meditation was played to engage in activity.   Education:  Stress Management, Discharge Planning.   Education Outcome: Acknowledges Education  Clinical Observations/Feedback: Pt did not attend activity.     Victorino Sparrow, LRT/CTRS         Victorino Sparrow A 01/25/2019 10:55 AM

## 2019-01-25 NOTE — BHH Group Notes (Signed)
LCSW Group Therapy Note 01/25/2019 2:59 PM  Type of Therapy and Topic: Group Therapy: Overcoming Obstacles  Participation Level: Active  Description of Group:  In this group patients will be encouraged to explore what they see as obstacles to their own wellness and recovery. They will be guided to discuss their thoughts, feelings, and behaviors related to these obstacles. The group will process together ways to cope with barriers, with attention given to specific choices patients can make. Each patient will be challenged to identify changes they are motivated to make in order to overcome their obstacles. This group will be process-oriented, with patients participating in exploration of their own experiences as well as giving and receiving support and challenge from other group members.  Therapeutic Goals: 1. Patient will identify personal and current obstacles as they relate to admission. 2. Patient will identify barriers that currently interfere with their wellness or overcoming obstacles.  3. Patient will identify feelings, thought process and behaviors related to these barriers. 4. Patient will identify two changes they are willing to make to overcome these obstacles:   Summary of Patient Progress  Charlotte Mccormick was engaged and participated throughout the group session. Charlotte Mccormick reports that her main obstacle is her lack of courage to stand up for herself. Charlotte Mccormick reports that she also is fearful that she will return to isolation when she discharges from the hospital.   Therapeutic Modalities:  Cognitive Behavioral Therapy Solution Focused Therapy Motivational Interviewing Relapse Prevention Therapy   [place] Social Worker

## 2019-01-25 NOTE — Progress Notes (Signed)
Spiritual care group on grief and loss facilitated by chaplain Jerene Pitch  Group Goal:  Support / Education around grief and loss Members engage in facilitated group support and psycho-social education.  Group Description:  Following introductions and group rules, group members engaged in facilitated group dialog and support around topic of loss, with particular support around experiences of loss in their lives. Group Identified types of loss (relationships / self / things) and identified patterns, circumstances, and changes that precipitate losses. Reflected on thoughts / feelings around loss, normalized grief responses, and recognized variety in grief experience. Patient Progress:  Present throughout group.  Presented calm, quiet, somewhat flat affect.  Engaged with facilitator and other group members.  Spoke of losing several people in life over the past year.  Noted that she was feeling isolated, as there is not space in her family narrative for mental health treatment.  Described growing up as preacher's child - stating that her family had believed that if she prayed or believed enough, she would not experience mental health needs.  Charlotte Mccormick noted she "tried this" and then felt guilty when this did not meet her needs.  Noted grieving a change in her faith and trying to find a new faith construct for herself.  States that she has found [facility] beneficial.  Worries about conversations she will have with her family - that she will lose others in her life due to believing differently than them.   She describes her spouse as increasingly supportive.

## 2019-01-26 LAB — HEPATITIS C ANTIBODY: HCV Ab: <0.1 s/co ratio (ref 0.0–0.9)

## 2019-01-26 LAB — HEPATITIS B SURFACE ANTIGEN: Hepatitis B Surface Ag: NEGATIVE

## 2019-01-26 MED ORDER — PRAZOSIN HCL 1 MG PO CAPS: 1.0000 mg | ORAL_CAPSULE | Freq: Every day | ORAL | 0 refills | Status: DC

## 2019-01-26 MED ORDER — HYDROXYZINE HCL 25 MG PO TABS: 25.0000 mg | ORAL_TABLET | Freq: Three times a day (TID) | ORAL | Status: DC | PRN

## 2019-01-26 MED ORDER — BUPROPION HCL ER (XL) 150 MG PO TB24: 150.0000 mg | ORAL_TABLET | Freq: Every day | ORAL | 0 refills | Status: DC

## 2019-01-26 MED ORDER — MIRTAZAPINE 7.5 MG PO TABS: 7.5000 mg | ORAL_TABLET | Freq: Every day | ORAL | 0 refills | Status: DC

## 2019-01-26 MED ORDER — HYDROXYZINE HCL 25 MG PO TABS: 25.0000 mg | ORAL_TABLET | Freq: Three times a day (TID) | ORAL | 0 refills | Status: DC | PRN

## 2019-01-26 NOTE — Progress Notes (Signed)
D: Pt was flat and depressed when speaking to the Probation officer. Stated, "alright" when asked about her day. When asked about questions or concerns pt stated, "I was wondering If I'll be going home tomorrow". Pt then began to discuss that she would need a supply of her medications. Initially pt asked for a 30 day supply, however, I informed that it may not be that much, if any.  Denies all  A:  Informed that it's case by case, but that I would let her team know about her concerns.  Support and encouragement was offered. 15 min checks continued for safety.  R: Pt remains safe.

## 2019-01-26 NOTE — Progress Notes (Signed)
D:  Patient's self inventory sheet, patient has fair sleep, sleep medication helpful.  Good appetite, normal energy level, fair concentration.  Rated depression and anxiety 2, denied hopeless.  Denied withdrawals.  Denied SI.  Physical problem, pain, worst pain #6 in past 24 hours, back, hands.  Pain medication helpful.  Goal is applying skills learned here in her life after discharge.  Plans to make time for herself.  Hopes to help others after discharge.  Does have discharge plans. A:  Medications administered per MD orders.  Emotional support and encouragement given patient. R:  Denied SI and HI, contracts for safety.  Denied A/V hallucinations.  Safety maintained with 15 minute checks.

## 2019-01-26 NOTE — Progress Notes (Signed)
  [facility] Adult Case Management Discharge Plan :  Will you be returning to the same living situation after discharge:  Yes,  home At discharge, do you have transportation home?: Yes,  husband will pick up Do you have the ability to pay for your medications: Yes,  [facility] insurance  Release of information consent forms completed and in the chart;  Patient's signature needed at discharge.  Patient to Follow up at: Follow-up Information    [facility].   Why: Therapist will contact patient with therapy appointment.  Contact information: [address], [place], [place], [place] [address]  P: [phone] F: [facility] Follow up.   Specialty: Behavioral Health Why: A referral was made on your behalf. The office will contact you to schedule a medication management appointment.  Contact information: [address] [place] [phone]       [facility] Follow up on 02/04/2019.   Why: Appointment with Lorenza Burton is Thursday, 9/3 at 3:00p.  Please bring your current medications.  Contact information: [address] [place] [address] [phone] Fx: [phone]          Next level of care provider has access to [facility] and Suicide Prevention discussed: Yes,  with husband, Charlotte Mccormick   Has patient been referred to the Quitline?: N/A patient is not a smoker  Patient has been referred for addiction treatment: Yes  Joellen Jersey, [place] 01/26/2019, 10:10 AM

## 2019-01-26 NOTE — Progress Notes (Signed)
Discharge note  Patient verbalizes readiness for discharge. Follow up plan explained, AVS, Transition record and SRA given. Prescriptions and teaching provided. Belongings returned and signed for. Suicide safety plan completed and signed. Patient verbalizes understanding. Patient denies SI/HI and assures this writer she will seek assistance should that change. Patient discharged to lobby where pt's husband was waiting.

## 2019-01-26 NOTE — Progress Notes (Signed)
Recreation Therapy Notes  Animal-Assisted Activity (AAA) Program Checklist/Progress Notes  Date: 8.25.20 Time: 85 Location: 44 Film/video editor  AAA/T Program Assumption of Risk Form signed by Teacher, music or Parent Legal Guardian  YES  Patient is free of allergies or sever asthma  YES   Patient reports no fear of animals  YES   Patient reports no history of cruelty to animals  YES  Patient understands his/her participation is voluntary  YES   Patient washes hands before animal contact YES   Patient washes hands after animal contact YES   Education: Contractor, Appropriate Animal Interaction   Education Outcome: Acknowledges understanding/In group clarification offered/Needs additional education.   Clinical Observations/Feedback:  Pt did not attend activity.     Victorino Sparrow, LRT/CTRS    Victorino Sparrow A 01/26/2019 3:25 PM

## 2019-01-26 NOTE — BHH Suicide Risk Assessment (Signed)
[facility] Discharge Suicide Risk Assessment   Principal Problem: <principal problem not specified> Discharge Diagnoses: Active Problems:   MDD (major depressive disorder)   Total Time spent with patient: 15 minutes  Musculoskeletal: Strength & Muscle Tone: within normal limits Gait & Station: normal Patient leans: N/A  Psychiatric Specialty Exam: Review of Systems  All other systems reviewed and are negative.   Blood pressure (!) 126/99, pulse (!) 106, temperature 97.7 F (36.5 C), temperature source Oral, resp. rate 20, height 5\' 2"  (1.575 m), weight 79.8 kg, SpO2 100 %, not currently breastfeeding.Body mass index is 32.19 kg/m.  General Appearance: Casual  Eye Contact::  Fair  Speech:  Normal Rate409  Volume:  Normal  Mood:  Euthymic  Affect:  Congruent  Thought Process:  Coherent and Descriptions of Associations: Intact  Orientation:  Full (Time, Place, and Person)  Thought Content:  Logical  Suicidal Thoughts:  No  Homicidal Thoughts:  No  Memory:  Immediate;   Fair Recent;   Fair Remote;   Fair  Judgement:  Intact  Insight:  Fair  Psychomotor Activity:  Normal  Concentration:  Fair  Recall:  Good  Fund of Knowledge:Good  Language: Good  Akathisia:  Negative  Handed:  Right  AIMS (if indicated):     Assets:  Desire for Improvement Housing Resilience Social Support  Sleep:  Number of Hours: 6.75  Cognition: WNL  ADL's:  Intact   Mental Status Per Nursing Assessment::   On Admission:  Suicidal ideation indicated by patient, Intention to act on suicide plan, Self-harm thoughts  Demographic Factors:  Low socioeconomic status  Loss Factors: NA  Historical Factors: Impulsivity  Risk Reduction Factors:   Sense of responsibility to family, Living with another person, especially a relative, Positive social support and Positive coping skills or problem solving skills  Continued Clinical Symptoms:  Depression:   Impulsivity  Cognitive Features That  Contribute To Risk:  None    Suicide Risk:  Minimal: No identifiable suicidal ideation.  Patients presenting with no risk factors but with morbid ruminations; may be classified as minimal risk based on the severity of the depressive symptoms  Follow-up Information    [facility] Follow up.   Contact information: [address], [place], [place], [place] [address]  P: [phone] F: [facility]- Follow up.   Specialty: Behavioral Health Why: Patient is asking for referral for psychiatry here. Contact information: [address] [place] [phone]       [facility] Follow up on 02/04/2019.   Why: Appointment with Lorenza Burton is Thursday, 9/3 at 3:00p.  Please bring your current medications.  Contact information: [place] [address] [phone] Fx: [phone]          Plan Of Care/Follow-up recommendations:  Activity:  ad lib  Sharma Covert, MD 01/26/2019, 7:56 AM

## 2019-01-26 NOTE — [facility] Suicide Risk Assessment (Signed)
[facility] INPATIENT:  Family/Significant Other Suicide Prevention Education  Suicide Prevention Education:  Education Completed; husband, Selina Cooley [phone] has been identified by the patient as the family member/significant other with whom the patient will be residing, and identified as the person(s) who will aid the patient in the event of a mental health crisis (suicidal ideations/suicide attempt).  With written consent from the patient, the family member/significant other has been provided the following suicide prevention education, prior to the and/or following the discharge of the patient.  The suicide prevention education provided includes the following:  Suicide risk factors  Suicide prevention and interventions  National Suicide Hotline telephone number  [facility] assessment telephone number  [facility] Emergency Assistance [place] and/or Residential Mobile Crisis Unit telephone number  Request made of family/significant other to:  Remove weapons (e.g., guns, rifles, knives), all items previously/currently identified as safety concern.    Remove drugs/medications (over-the-counter, prescriptions, illicit drugs), all items previously/currently identified as a safety concern.  The family member/significant other verbalizes understanding of the suicide prevention education information provided.  The family member/significant other agrees to remove the items of safety concern listed above.  No concerns or questions.  Joellen Jersey 01/26/2019, 9:18 AM
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
## 2019-02-02 ENCOUNTER — Other Ambulatory Visit: Payer: Self-pay

## 2019-02-02 ENCOUNTER — Ambulatory Visit (INDEPENDENT_AMBULATORY_CARE_PROVIDER_SITE_OTHER): Payer: 59 | Admitting: Licensed Clinical Social Worker

## 2019-02-02 ENCOUNTER — Encounter (HOSPITAL_COMMUNITY): Payer: Self-pay | Admitting: Licensed Clinical Social Worker

## 2019-02-02 DIAGNOSIS — F4310 Post-traumatic stress disorder, unspecified: Secondary | ICD-10-CM | POA: Diagnosis not present

## 2019-02-02 DIAGNOSIS — F331 Major depressive disorder, recurrent, moderate: Secondary | ICD-10-CM
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
## 2019-02-03 ENCOUNTER — Ambulatory Visit (HOSPITAL_COMMUNITY): Payer: 59 | Admitting: Psychiatry

## 2019-02-03 ENCOUNTER — Other Ambulatory Visit: Payer: Self-pay

## 2019-02-09 ENCOUNTER — Ambulatory Visit (HOSPITAL_COMMUNITY): Payer: 59 | Admitting: Licensed Clinical Social Worker

## 2019-02-10 ENCOUNTER — Other Ambulatory Visit: Payer: Self-pay

## 2019-02-10 ENCOUNTER — Ambulatory Visit (INDEPENDENT_AMBULATORY_CARE_PROVIDER_SITE_OTHER): Payer: 59 | Admitting: Psychiatry

## 2019-02-10 DIAGNOSIS — F330 Major depressive disorder, recurrent, mild: Secondary | ICD-10-CM

## 2019-02-10 DIAGNOSIS — F4310 Post-traumatic stress disorder, unspecified: Secondary | ICD-10-CM | POA: Diagnosis not present

## 2019-02-10 MED ORDER — HYDROXYZINE HCL 25 MG PO TABS: 25.0000 mg | ORAL_TABLET | Freq: Three times a day (TID) | ORAL | 0 refills | Status: DC | PRN

## 2019-02-10 MED ORDER — BUPROPION HCL ER (XL) 150 MG PO TB24: 150.0000 mg | ORAL_TABLET | Freq: Every day | ORAL | 0 refills | Status: DC

## 2019-02-10 MED ORDER — PRAZOSIN HCL 2 MG PO CAPS: 2.0000 mg | ORAL_CAPSULE | Freq: Every day | ORAL | 0 refills | Status: DC

## 2019-02-10 MED ORDER — MIRTAZAPINE 15 MG PO TABS: 15.0000 mg | ORAL_TABLET | Freq: Every day | ORAL | 0 refills | Status: DC
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
## 2019-02-15 ENCOUNTER — Ambulatory Visit (INDEPENDENT_AMBULATORY_CARE_PROVIDER_SITE_OTHER): Payer: 59 | Admitting: Women's Health

## 2019-02-15 ENCOUNTER — Other Ambulatory Visit (HOSPITAL_COMMUNITY)
Admission: RE | Admit: 2019-02-15 | Discharge: 2019-02-15 | Disposition: A | Discharge status: Home / Self Care | Payer: 59 | Source: Ambulatory Visit | Attending: Obstetrics & Gynecology | Admitting: Obstetrics & Gynecology

## 2019-02-15 ENCOUNTER — Other Ambulatory Visit: Payer: Self-pay

## 2019-02-15 ENCOUNTER — Encounter: Payer: Self-pay | Admitting: Women's Health

## 2019-02-15 VITALS — BP 125/85 | HR 95 | Ht 62.0 in | Wt 182.0 lb

## 2019-02-15 DIAGNOSIS — Z3009 Encounter for other general counseling and advice on contraception: Secondary | ICD-10-CM

## 2019-02-15 DIAGNOSIS — Z01419 Encounter for gynecological examination (general) (routine) without abnormal findings: Secondary | ICD-10-CM

## 2019-02-15 DIAGNOSIS — Z113 Encounter for screening for infections with a predominantly sexual mode of transmission: Secondary | ICD-10-CM

## 2019-02-15 DIAGNOSIS — Z3041 Encounter for surveillance of contraceptive pills: Secondary | ICD-10-CM

## 2019-02-15 MED ORDER — LO LOESTRIN FE 1 MG-10 MCG / 10 MCG PO TABS: 1.0000 | ORAL_TABLET | Freq: Every day | ORAL | 3 refills | Status: DC

## 2019-02-15 NOTE — Progress Notes (Signed)
WELL-WOMAN EXAMINATION Patient name: Charlotte Mccormick MRN [id number]  Date of birth: 1986/02/01 Chief Complaint:   Gynecologic Exam  History of Present Illness:   Charlotte Mccormick is a 33 y.o. [id number] African American female being seen today for a routine FP Mcaid well-woman exam.  Current complaints: itchy bumps in gluteal fold x few days Dep/anx managed by [facility], referred to psychiatrist  PCP: [facility]      Patient's last menstrual period was 02/09/2019. The current method of family planning is OCP (estrogen/progesterone).  Last pap 2017. Results were: normal Last mammogram: never. Results were: n/a. Family h/o breast cancer: No Last colonoscopy: 2017 for heme+ stool, constipation. Results were: polyps, repeat in 5-69yrs. Family h/o colorectal cancer: No Review of Systems:   Pertinent items are noted in HPI Denies any headaches, blurred vision, fatigue, shortness of breath, chest pain, abdominal pain, abnormal vaginal discharge/itching/odor/irritation, problems with periods, bowel movements, urination, or intercourse unless otherwise stated above. Pertinent History Reviewed:  Reviewed past medical,surgical, social and family history.  Reviewed problem list, medications and allergies. Physical Assessment:   Vitals:   02/15/19 1611  BP: 125/85  Pulse: 95  Weight: 182 lb (82.6 kg)  Height: 5\' 2"  (1.575 m)  Body mass index is 33.29 kg/m.        Physical Examination:   General appearance - well appearing, and in no distress  Mental status - alert, oriented to person, place, and time  Psych:  She has a normal mood and affect  Skin - warm and dry, normal color, no suspicious lesions noted  Chest - effort normal, all lung fields clear to auscultation bilaterally  Heart - normal rate and regular rhythm  Neck:  midline trachea, no thyromegaly or nodules  Breasts - breasts appear normal, no suspicious masses, no skin or nipple changes or  axillary nodes  Abdomen - soft, nontender, nondistended, no masses or organomegaly  Pelvic - VULVA: normal appearing vulva with no masses, tenderness or lesions  VAGINA: normal appearing vagina with normal color and discharge, no lesions  CERVIX: normal appearing cervix without discharge or lesions, no CMT  Thin prep pap is done w/ HR HPV cotesting  UTERUS: uterus is felt to be normal size, shape, consistency and nontender   ADNEXA: No adnexal masses or tenderness noted.  Rectal> ~3cm gluteal fold skin fissure  Extremities:  No swelling or varicosities noted  No results found for this or any previous visit (from the past 24 hour(s)).  Assessment & Plan:  1) FP Mcaid Well-Woman Exam  2) STD screen  3) Gluteal fold skin fissure  4) Dep/anx> managed by [facility], referred to psychiatrist by them  5) Contraception management> refilled LoLoestrin  Labs/procedures today: pap  Mammogram @33yo  or sooner if problems Colonoscopy 2022 per last TCS, or sooner if problems  Orders Placed This Encounter  Procedures  . HIV antibody (with reflex)  . RPR    Meds:  Meds ordered this encounter  Medications  . LO LOESTRIN FE 1 MG-10 MCG / 10 MCG tablet    Sig: Take 1 tablet by mouth daily.    Dispense:  3 Package    Refill:  3    For co-pay card, pt to text "Lo Loestrin Fe " to [phone]              Co-pay card must be run in second position  "other coverage code 3"  if denied d/t PA, step edit, or insurance denial    Order  Specific Question:   Supervising Provider    Answer:   Florian Buff [[id number]]    Follow-up: Return in about 1 year (around 02/15/2020) for Physical.  [name] CNM, WHNP-BC 02/15/2019 4:53 PM

## 2019-02-16 ENCOUNTER — Other Ambulatory Visit: Payer: Self-pay | Admitting: Women's Health

## 2019-02-16 LAB — HIV ANTIBODY (ROUTINE TESTING W REFLEX): HIV Screen 4th Generation wRfx: NONREACTIVE

## 2019-02-16 LAB — RPR: RPR Ser Ql: NONREACTIVE

## 2019-02-16 MED ORDER — HYDROCORTISONE (PERIANAL) 2.5 % EX CREA: 1.0000 "application " | TOPICAL_CREAM | Freq: Two times a day (BID) | CUTANEOUS | 0 refills | Status: DC

## 2019-02-18 LAB — CYTOLOGY - PAP
Chlamydia: NEGATIVE
Diagnosis: NEGATIVE
HPV: NOT DETECTED
Neisseria Gonorrhea: NEGATIVE

## 2019-03-05 ENCOUNTER — Other Ambulatory Visit: Payer: Self-pay

## 2019-03-05 ENCOUNTER — Ambulatory Visit (INDEPENDENT_AMBULATORY_CARE_PROVIDER_SITE_OTHER): Payer: 59 | Admitting: Psychiatry

## 2019-03-05 DIAGNOSIS — F330 Major depressive disorder, recurrent, mild: Secondary | ICD-10-CM

## 2019-03-05 DIAGNOSIS — F4310 Post-traumatic stress disorder, unspecified: Secondary | ICD-10-CM

## 2019-03-05 MED ORDER — BUPROPION HCL ER (XL) 300 MG PO TB24: 300.0000 mg | ORAL_TABLET | Freq: Every day | ORAL | 1 refills | Status: DC

## 2019-03-05 MED ORDER — ZOLPIDEM TARTRATE ER 12.5 MG PO TBCR: 12.5000 mg | EXTENDED_RELEASE_TABLET | Freq: Every evening | ORAL | 1 refills | Status: DC | PRN

## 2019-03-05 MED ORDER — LORAZEPAM 0.5 MG PO TABS: 0.5000 mg | ORAL_TABLET | Freq: Two times a day (BID) | ORAL | 1 refills | Status: AC | PRN

## 2019-03-05 MED ORDER — PRAZOSIN HCL 2 MG PO CAPS: 2.0000 mg | ORAL_CAPSULE | Freq: Every day | ORAL | 1 refills | Status: DC
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
## 2019-03-25 ENCOUNTER — Other Ambulatory Visit: Payer: Self-pay

## 2019-03-25 DIAGNOSIS — Z20822 Contact with and (suspected) exposure to covid-19: Secondary | ICD-10-CM

## 2019-03-27 LAB — NOVEL CORONAVIRUS, NAA: SARS-CoV-2, NAA: NOT DETECTED

## 2019-04-16 IMAGING — DX DG CHEST 2V
2 series · 2 of 2 positions shown · non-contrast
Comparison: None.

CLINICAL DATA: Acute onset of generalized chest pain, radiating to
the back. Initial encounter.

EXAM:
CHEST  2 VIEW

[chest pa]
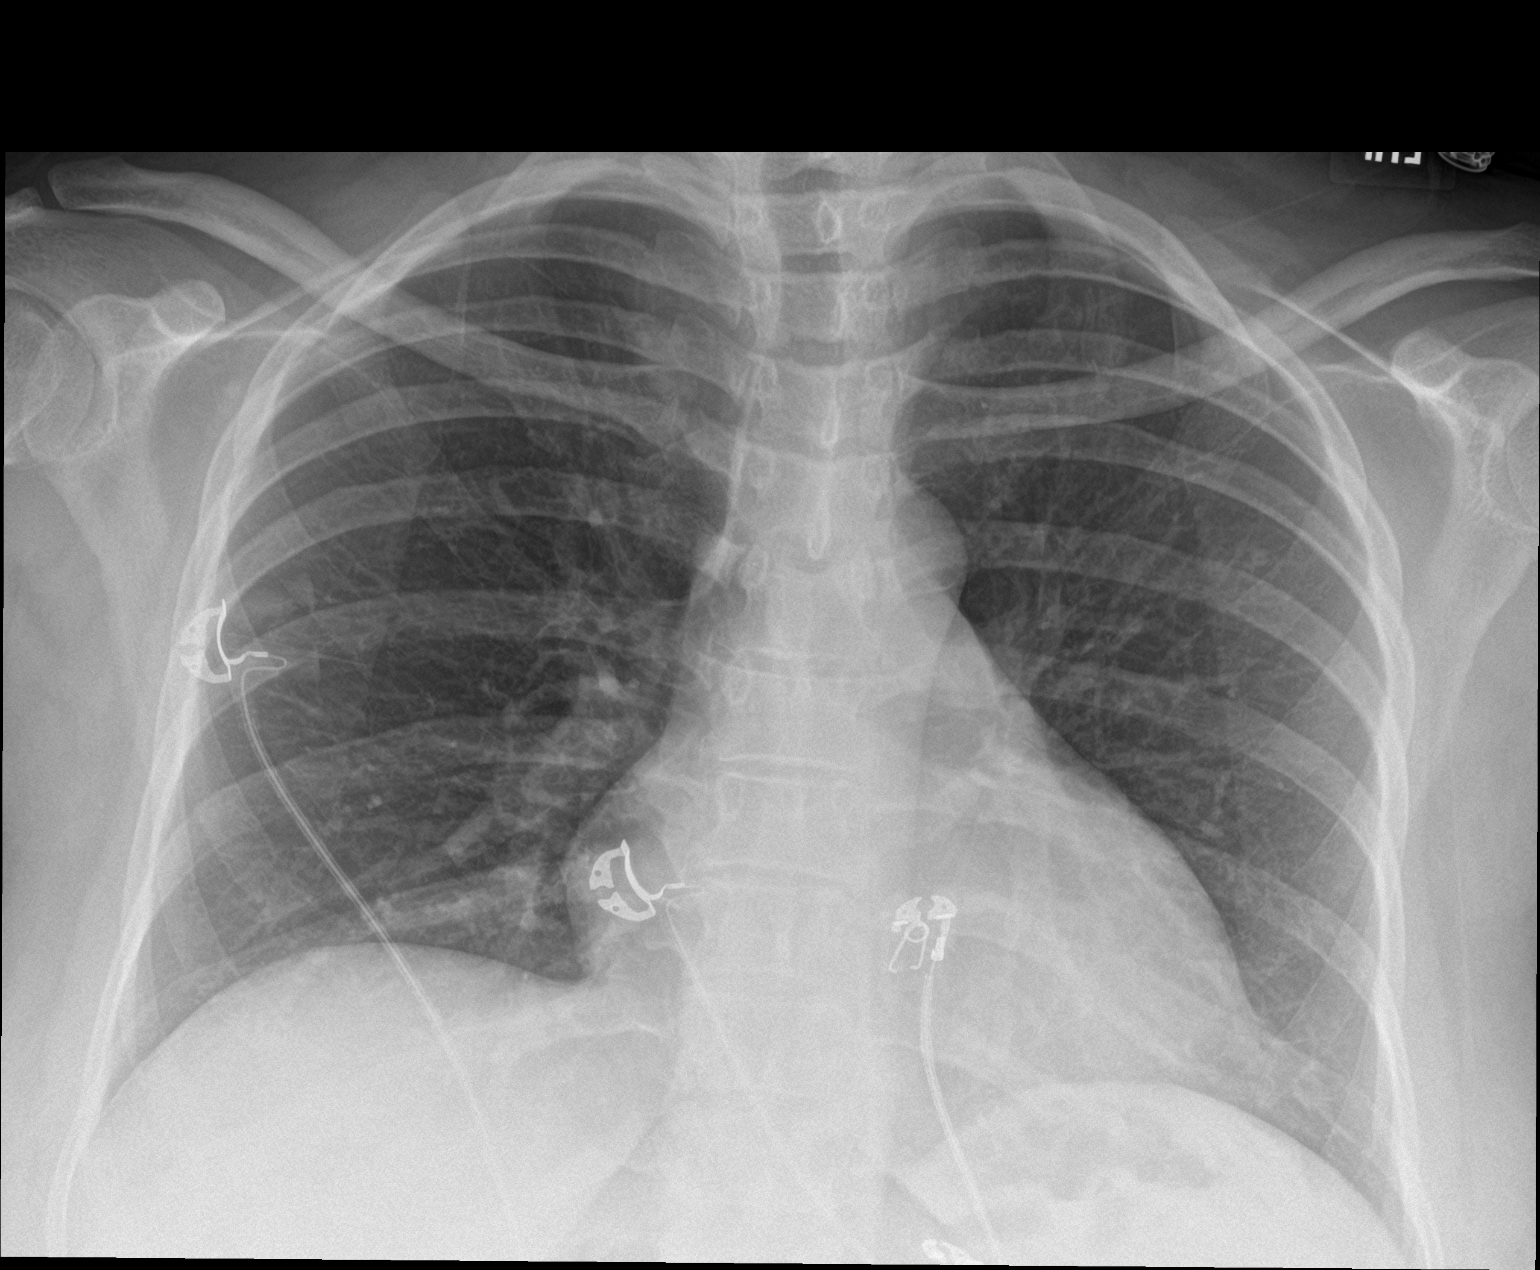

[chest lat]
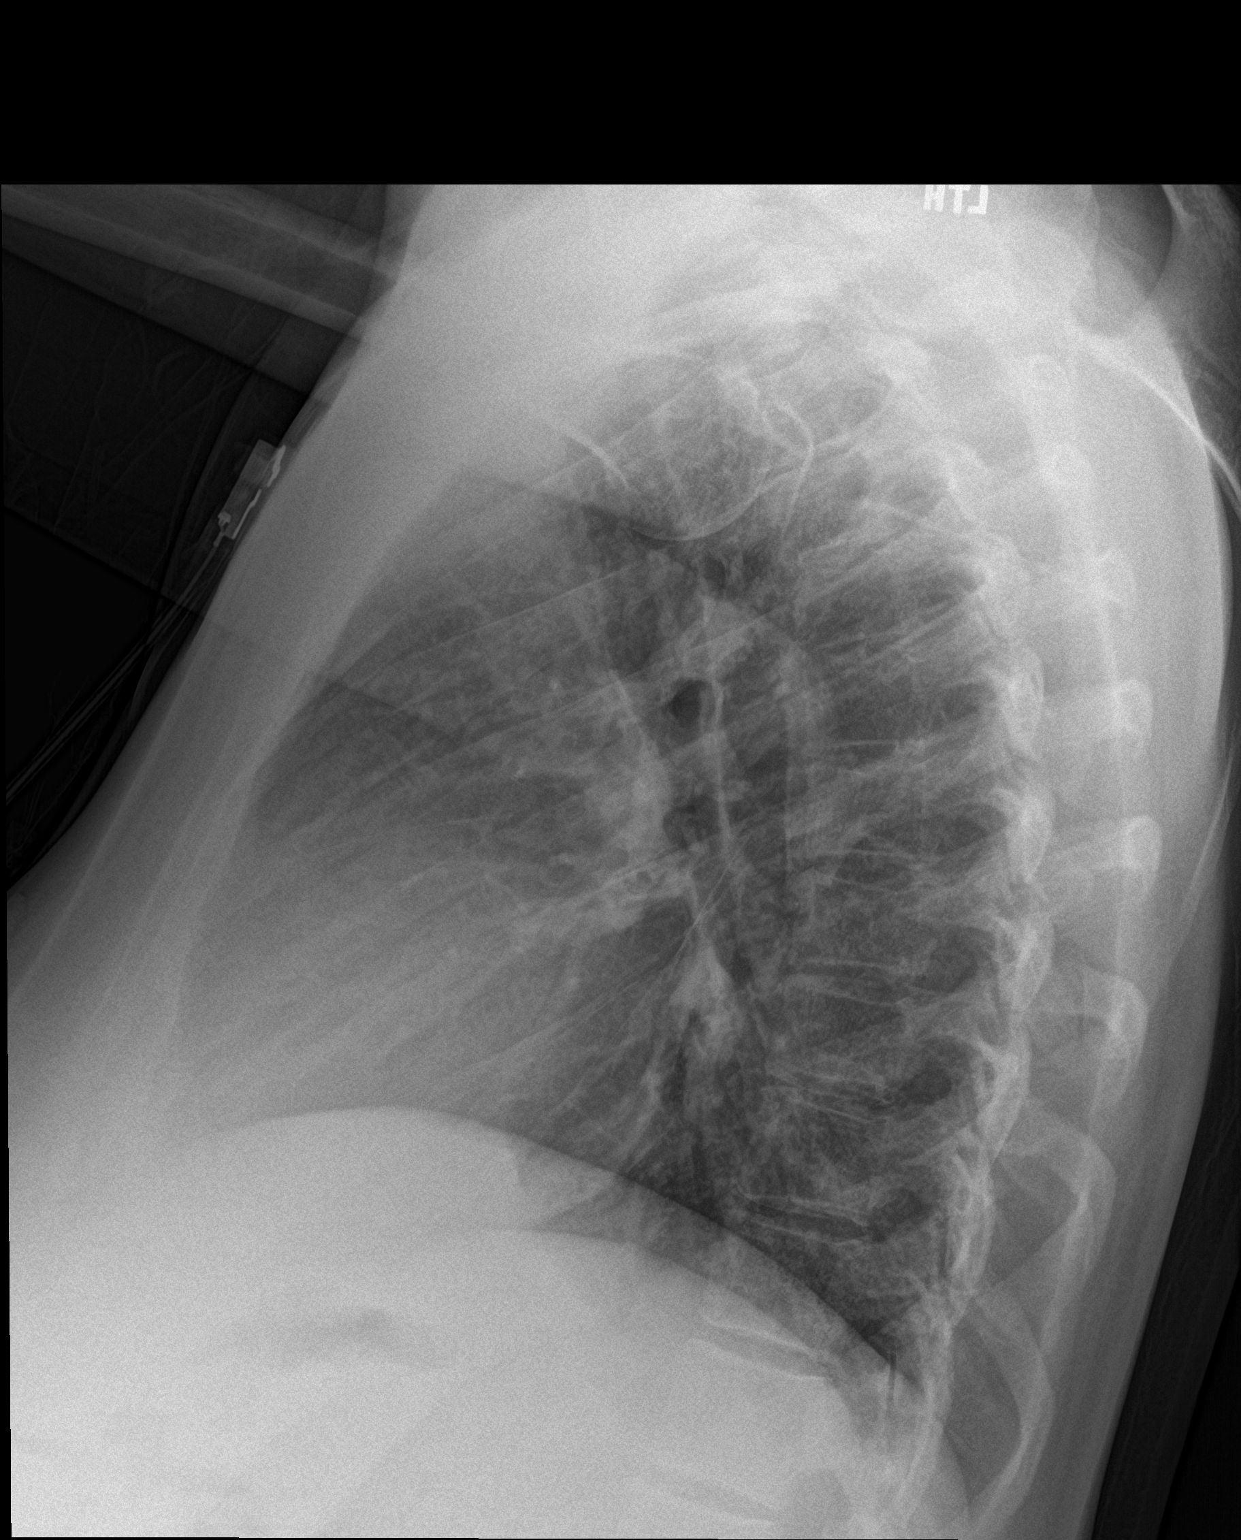

[2 of 2 positions shown; findings below may reference images not displayed]

FINDINGS: The lungs are well-aerated and clear. There is no evidence of focal
opacification, pleural effusion or pneumothorax.

The heart is borderline normal in size. No acute osseous
abnormalities are seen.
IMPRESSION: No acute cardiopulmonary process seen.

## 2019-04-19 ENCOUNTER — Other Ambulatory Visit: Payer: Self-pay

## 2019-04-19 ENCOUNTER — Ambulatory Visit (INDEPENDENT_AMBULATORY_CARE_PROVIDER_SITE_OTHER): Payer: 59 | Admitting: Psychiatry

## 2019-04-19 DIAGNOSIS — F331 Major depressive disorder, recurrent, moderate: Secondary | ICD-10-CM

## 2019-04-19 DIAGNOSIS — F4310 Post-traumatic stress disorder, unspecified: Secondary | ICD-10-CM
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
## 2019-05-04 ENCOUNTER — Telehealth (HOSPITAL_COMMUNITY): Payer: Self-pay | Admitting: *Deleted

## 2019-05-04 NOTE — Telephone Encounter (Signed)
Pt left message requesting refills on her Wellbutrin and Minipress. Pt does not have any future appointments. Pt speech delayed and slurred making it difficult to completely understanding message as well as chaotic background noises.

## 2019-05-05 ENCOUNTER — Other Ambulatory Visit (HOSPITAL_COMMUNITY): Payer: Self-pay | Admitting: Psychiatry

## 2019-05-05 MED ORDER — BUPROPION HCL ER (XL) 300 MG PO TB24: 300.0000 mg | ORAL_TABLET | Freq: Every day | ORAL | 1 refills | Status: DC

## 2019-05-05 MED ORDER — PRAZOSIN HCL 2 MG PO CAPS: 2.0000 mg | ORAL_CAPSULE | Freq: Every day | ORAL | 1 refills | Status: DC

## 2019-05-05 NOTE — Telephone Encounter (Signed)
I send requested refills to pharmacy. She has appointment with me on 05/19/19.

## 2019-05-05 NOTE — Telephone Encounter (Signed)
Ok thanks 

## 2019-05-19 ENCOUNTER — Other Ambulatory Visit: Payer: Self-pay

## 2019-05-19 ENCOUNTER — Ambulatory Visit (INDEPENDENT_AMBULATORY_CARE_PROVIDER_SITE_OTHER): Payer: Medicare Other | Admitting: Psychiatry

## 2019-05-19 DIAGNOSIS — F331 Major depressive disorder, recurrent, moderate: Secondary | ICD-10-CM

## 2019-05-19 DIAGNOSIS — F4312 Post-traumatic stress disorder, chronic: Secondary | ICD-10-CM

## 2019-05-19 DIAGNOSIS — F4310 Post-traumatic stress disorder, unspecified: Secondary | ICD-10-CM

## 2019-05-19 MED ORDER — ZOLPIDEM TARTRATE ER 12.5 MG PO TBCR: 12.5000 mg | EXTENDED_RELEASE_TABLET | Freq: Every evening | ORAL | 1 refills | Status: DC | PRN

## 2019-05-19 MED ORDER — BUPROPION HCL ER (XL) 300 MG PO TB24: 300.0000 mg | ORAL_TABLET | Freq: Every day | ORAL | 1 refills | Status: DC

## 2019-05-19 MED ORDER — CLONAZEPAM 0.5 MG PO TABS: 0.5000 mg | ORAL_TABLET | Freq: Two times a day (BID) | ORAL | 1 refills | Status: DC | PRN

## 2019-05-19 MED ORDER — PRAZOSIN HCL 2 MG PO CAPS: 2.0000 mg | ORAL_CAPSULE | Freq: Every day | ORAL | 1 refills | Status: DC
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
## 2019-07-21 ENCOUNTER — Ambulatory Visit (INDEPENDENT_AMBULATORY_CARE_PROVIDER_SITE_OTHER): Payer: Medicare Other | Admitting: Psychiatry

## 2019-07-21 ENCOUNTER — Other Ambulatory Visit: Payer: Self-pay

## 2019-07-21 DIAGNOSIS — F330 Major depressive disorder, recurrent, mild: Secondary | ICD-10-CM | POA: Diagnosis not present

## 2019-07-21 DIAGNOSIS — F4310 Post-traumatic stress disorder, unspecified: Secondary | ICD-10-CM | POA: Diagnosis not present

## 2019-07-21 MED ORDER — ZOLPIDEM TARTRATE ER 12.5 MG PO TBCR: 12.5000 mg | EXTENDED_RELEASE_TABLET | Freq: Every evening | ORAL | 1 refills | Status: DC | PRN

## 2019-07-21 MED ORDER — DIAZEPAM 5 MG PO TABS: 5.0000 mg | ORAL_TABLET | Freq: Two times a day (BID) | ORAL | 0 refills | Status: DC | PRN

## 2019-07-21 MED ORDER — PRAZOSIN HCL 5 MG PO CAPS: 5.0000 mg | ORAL_CAPSULE | Freq: Every day | ORAL | 1 refills | Status: DC

## 2019-07-21 MED ORDER — BUPROPION HCL ER (XL) 300 MG PO TB24: 300.0000 mg | ORAL_TABLET | Freq: Every day | ORAL | 1 refills | Status: DC
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
## 2019-09-01 ENCOUNTER — Ambulatory Visit (INDEPENDENT_AMBULATORY_CARE_PROVIDER_SITE_OTHER): Payer: 59 | Admitting: Psychiatry

## 2019-09-01 ENCOUNTER — Other Ambulatory Visit: Payer: Self-pay

## 2019-09-01 DIAGNOSIS — F330 Major depressive disorder, recurrent, mild: Secondary | ICD-10-CM | POA: Diagnosis not present

## 2019-09-01 DIAGNOSIS — F4310 Post-traumatic stress disorder, unspecified: Secondary | ICD-10-CM

## 2019-09-01 MED ORDER — DIAZEPAM 5 MG PO TABS: 5.0000 mg | ORAL_TABLET | Freq: Two times a day (BID) | ORAL | 2 refills | Status: DC | PRN

## 2019-09-01 MED ORDER — ZOLPIDEM TARTRATE ER 12.5 MG PO TBCR: 12.5000 mg | EXTENDED_RELEASE_TABLET | Freq: Every evening | ORAL | 2 refills | Status: DC | PRN

## 2019-09-01 MED ORDER — PRAZOSIN HCL 5 MG PO CAPS: 5.0000 mg | ORAL_CAPSULE | Freq: Every day | ORAL | 2 refills | Status: DC

## 2019-09-01 MED ORDER — BUPROPION HCL ER (XL) 300 MG PO TB24: 300.0000 mg | ORAL_TABLET | Freq: Every day | ORAL | 2 refills | Status: DC
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
## 2019-10-05 ENCOUNTER — Ambulatory Visit
Admission: EM | Admit: 2019-10-05 | Discharge: 2019-10-05 | Disposition: A | Discharge status: Home / Self Care | Payer: 59 | Attending: Family Medicine | Admitting: Family Medicine

## 2019-10-05 ENCOUNTER — Other Ambulatory Visit: Payer: Self-pay

## 2019-10-05 DIAGNOSIS — K2100 Gastro-esophageal reflux disease with esophagitis, without bleeding: Secondary | ICD-10-CM | POA: Diagnosis not present

## 2019-10-05 DIAGNOSIS — R079 Chest pain, unspecified: Secondary | ICD-10-CM

## 2019-10-05 MED ORDER — LIDOCAINE VISCOUS HCL 2 % MT SOLN
15.0000 mL | Freq: Once | OROMUCOSAL | Status: AC
  Administered 2019-10-05: 15 mL via ORAL

## 2019-10-05 MED ORDER — ALUM & MAG HYDROXIDE-SIMETH 200-200-20 MG/5ML PO SUSP
30.0000 mL | Freq: Once | ORAL | Status: AC
  Administered 2019-10-05: 20:00:00 30 mL via ORAL

## 2019-10-05 MED ORDER — OMEPRAZOLE 20 MG PO CPDR: 20.0000 mg | DELAYED_RELEASE_CAPSULE | Freq: Every day | ORAL | 0 refills | Status: DC

## 2019-10-05 NOTE — Discharge Instructions (Signed)
I am most concerned about your esophagus at this time since the medication did make a difference.  I have sent in omeprazole for  you to take once daily.   If this is not helping you, I would have you follow up with GI.  If you are experiencing increased chest pain, shortness of breath, fatigue, I would have you follow up at the ER to rule out cardiac cause.

## 2019-10-05 NOTE — ED Provider Notes (Signed)
[facility]   [id number] 10/05/19 Arrival Time: 1759   CC: CHEST PAIN  SUBJECTIVE:  Heide Oelfke is a 34 y.o. female who presents with complaint of gradual chest pain that began 4 days ago.  Denies a precipitating event, trauma, recent lower respiratory tract, or strenuous upper body activities. Localizes chest pain to the substernal region.  Describes as worsening, that is intermittent (with episodes last minutes to hours) and burning/dull/ nagging in character.  Rates pain as 7/10.   Has tried pepto, zantac without relief.  Symptoms made worse with nothing.  Denies radiating symptoms.  Reports previous symptoms in the past.  Denies fever, chills, lightheadedness, dizziness, palpitations, tachycardia, SOB, nausea, vomiting, abdominal pain, changes in bowel or bladder habits, diaphoresis, numbness/tingling in extremities, peripheral edema, or anxiety.    Denies SOB, calf pain or swelling, recent long travel, recent surgery, pregnancy, malignancy, tobacco use, or previous blood clot  Has 2 close relatives with cardiac hx, both parents.  Previous cardiac testing: electrocardiogram (ECG).  ED ECG REPORT   Date: 10/06/2019  EKG Time: 2:23 PM  Rate: 81  Rhythm: normal sinus rhythm,  normal EKG, normal sinus rhythm, unchanged from previous tracings, nonspecific ST and T waves changes  Axis: 17/77/36  Intervals:none  ST&T Change: nonspecific  Narrative Interpretation: Compared to previous EKG, this is very similar to the EKG on 07/19/2016. No concern for ACS.    ROS: As per HPI.  All other pertinent ROS negative.    Past Medical History:  Diagnosis Date  . Abnormal uterine bleeding (AUB) 12/12/2015  . Anemia   . Anxiety   . Asthma   . Constipation   . Depression   . Fibroid   . Genital herpes   . GERD (gastroesophageal reflux disease)   . History of chlamydia   . Hyperemesis   . IBS (irritable bowel syndrome)   . Insomnia   . Mental disorder    PTSD,  ANXIETY,Depression  . PTSD (post-traumatic stress disorder)   . Sinusitis   . Vaginal dryness 12/12/2015   Past Surgical History:  Procedure Laterality Date  . COLONOSCOPY     2016  . COLONOSCOPY N/A 04/08/2016   Procedure: COLONOSCOPY;  Surgeon: Danie Binder, MD;  [facility];  Service: Endoscopy;  Laterality: N/A;  10:15 AM  . ESOPHAGOGASTRODUODENOSCOPY N/A 04/08/2016   Procedure: ESOPHAGOGASTRODUODENOSCOPY (EGD);  Surgeon: Danie Binder, MD;  [facility];  Service: Endoscopy;  Laterality: N/A;  . PERINEUM REPAIR    . UPPER GASTROINTESTINAL ENDOSCOPY  2016   Allergies  Allergen Reactions  . Bee Venom Swelling and Other (See Comments)    Reaction:  Localized swelling   . Penicillins Itching and Other (See Comments)    Has patient had a PCN reaction causing immediate rash, facial/tongue/throat swelling, SOB or lightheadedness with hypotension: No Has patient had a PCN reaction causing severe rash involving mucus membranes or skin necrosis: No Has patient had a PCN reaction that required hospitalization No Has patient had a PCN reaction occurring within the last 10 years: Yes If all of the above answers are "NO", then may proceed with Cephalosporin use.   . Reglan [Metoclopramide] Anxiety   No current facility-administered medications on file prior to encounter.   Current Outpatient Medications on File Prior to Encounter  Medication Sig Dispense Refill  . buPROPion (WELLBUTRIN XL) 300 MG 24 hr tablet Take 1 tablet (300 mg total) by mouth daily. 30 tablet 2  . diazepam (VALIUM) 5  MG tablet Take 1 tablet (5 mg total) by mouth 2 (two) times daily as needed for anxiety. 30 tablet 2  . hydrocortisone (ANUSOL-HC) 2.5 % rectal cream Place 1 application rectally 2 (two) times daily. 30 g 0  . LO LOESTRIN FE 1 MG-10 MCG / 10 MCG tablet Take 1 tablet by mouth daily. 3 Package 3  . meloxicam (MOBIC) 15 MG tablet TAKE 1 TABLET BY MOUTH ONCE DAILY FOR PAIN    .  prazosin (MINIPRESS) 5 MG capsule Take 1 capsule (5 mg total) by mouth at bedtime. 30 capsule 2  . zolpidem (AMBIEN CR) 12.5 MG CR tablet Take 1 tablet (12.5 mg total) by mouth at bedtime as needed for sleep. 30 tablet 2   Social History   Socioeconomic History  . Marital status: Married    Spouse name: Not on file  . Number of children: Not on file  . Years of education: Not on file  . Highest education level: Not on file  Occupational History  . Not on file  Tobacco Use  . Smoking status: Former Smoker    Packs/day: 0.50    Years: 5.00    Pack years: 2.50    Types: Cigarettes    Quit date: 06/03/2010    Years since quitting: 9.3  . Smokeless tobacco: Never Used  Substance and Sexual Activity  . Alcohol use: Yes    Alcohol/week: 2.0 standard drinks    Types: 2 Glasses of wine per week    Comment: occ wine  . Drug use: Yes    Types: Marijuana  . Sexual activity: Yes    Birth control/protection: Pill  Other Topics Concern  . Not on file  Social History Narrative   ** Merged History Encounter **       Social Determinants of Health   Financial Resource Strain:   . Difficulty of Paying Living Expenses:   Food Insecurity:   . Worried About Charity fundraiser in the Last Year:   . Arboriculturist in the Last Year:   Transportation Needs:   . Film/video editor (Medical):   Marland Kitchen Lack of Transportation (Non-Medical):   Physical Activity:   . Days of Exercise per Week:   . Minutes of Exercise per Session:   Stress:   . Feeling of Stress :   Social Connections:   . Frequency of Communication with Friends and Family:   . Frequency of Social Gatherings with Friends and Family:   . Attends Religious Services:   . Active Member of Clubs or Organizations:   . Attends Archivist Meetings:   Marland Kitchen Marital Status:   Intimate Partner Violence:   . Fear of Current or Ex-Partner:   . Emotionally Abused:   Marland Kitchen Physically Abused:   . Sexually Abused:    Family History    Problem Relation Age of Onset  . Heart disease Mother   . Hypertension Mother   . Hypertension Father   . Hyperlipidemia Father   . Diabetes Father   . Cancer Father   . Epilepsy Brother   . Eczema Daughter   . Colon cancer Neg Hx      OBJECTIVE:  Vitals:   10/05/19 1814  BP: 115/77  Pulse: 82  Resp: 20  Temp: 98.3 F (36.8 C)  SpO2: 98%    General appearance: alert; no distress Eyes: PERRLA; EOMI; conjunctiva normal HENT: normocephalic; atraumatic Neck: supple Lungs: clear to auscultation bilaterally without adventitious breath sounds Heart:  regular rate and rhythm.  Clear S1 and S2 without rubs, gallops, or murmur. Chest Wall: nontender, no thrills Abdomen: soft, non-tender; bowel sounds normal; no masses or organomegaly; no guarding or rebound tenderness Extremities: no cyanosis or edema; symmetrical with no gross deformities Skin: warm and dry Psychological: alert and cooperative; normal mood and affect  ECG: Orders placed or performed during the hospital encounter of 10/05/19  . EKG    EKG normal sinus rhythm with nonspecific T wave abnormality  LABS:  Results for orders placed or performed in visit on 03/25/19  Novel Coronavirus, NAA ([facility])   Specimen: Nasopharyngeal(NP) swabs in vial transport medium   NASOPHARYNGE  TESTING  Result Value Ref Range   SARS-CoV-2, NAA Not Detected Not Detected   Labs Reviewed - No data to display  DIAGNOSTIC STUDIES:  No results found.   ASSESSMENT & PLAN:  1. Chest pain, unspecified type   2. Gastroesophageal reflux disease with esophagitis without hemorrhage     Meds ordered this encounter  Medications  . AND Linked Order Group   . alum & mag hydroxide-simeth (MAALOX/MYLANTA) 200-200-20 MG/5ML suspension 30 mL   . lidocaine (XYLOCAINE) 2 % viscous mouth solution 15 mL  . omeprazole (PRILOSEC) 20 MG capsule    Sig: Take 1 capsule (20 mg total) by mouth daily.    Dispense:  30 capsule    Refill:  0     Order Specific Question:   Supervising Provider    Answer:   Chase Picket [id number]    Patient history and exam consistent with non-cardiac cause of chest pain.  GI cocktail given in office. Pain relief with GI cocktail.  Prescribed omeprazole 20mg  daily Chest pain precautions given. Reviewed expectations about course of current medical issues. Questions answered. Outlined signs and symptoms indicating need for more acute intervention. Patient verbalized understanding. After Visit Summary given.    Faustino Congress, NP 10/06/19 1424

## 2019-10-05 NOTE — ED Triage Notes (Signed)
Pt presents with c/o epigastric pain and abdominal pain . Pt states that pain increases when she bends over. Pt also had one episode of vomiting on Saturday

## 2019-10-11 ENCOUNTER — Other Ambulatory Visit: Payer: Self-pay

## 2019-10-11 ENCOUNTER — Telehealth (INDEPENDENT_AMBULATORY_CARE_PROVIDER_SITE_OTHER): Payer: 59 | Admitting: Psychiatry

## 2019-10-11 DIAGNOSIS — F330 Major depressive disorder, recurrent, mild: Secondary | ICD-10-CM

## 2019-10-11 DIAGNOSIS — F4310 Post-traumatic stress disorder, unspecified: Secondary | ICD-10-CM | POA: Diagnosis not present

## 2019-10-11 DIAGNOSIS — F410 Panic disorder [episodic paroxysmal anxiety] without agoraphobia: Secondary | ICD-10-CM | POA: Insufficient documentation

## 2019-10-11 MED ORDER — DIAZEPAM 5 MG PO TABS: 5.0000 mg | ORAL_TABLET | Freq: Three times a day (TID) | ORAL | 1 refills | Status: AC | PRN
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
## 2019-10-13 ENCOUNTER — Encounter (HOSPITAL_COMMUNITY): Payer: Self-pay | Admitting: Emergency Medicine

## 2019-10-13 ENCOUNTER — Emergency Department (HOSPITAL_COMMUNITY)
Admission: EM | Admit: 2019-10-13 | Discharge: 2019-10-13 | Disposition: A | Discharge status: Home / Self Care | Payer: 59 | Attending: Emergency Medicine | Admitting: Emergency Medicine

## 2019-10-13 ENCOUNTER — Emergency Department (HOSPITAL_COMMUNITY): Payer: 59

## 2019-10-13 ENCOUNTER — Other Ambulatory Visit: Payer: Self-pay

## 2019-10-13 DIAGNOSIS — R072 Precordial pain: Secondary | ICD-10-CM

## 2019-10-13 DIAGNOSIS — Z87891 Personal history of nicotine dependence: Secondary | ICD-10-CM | POA: Insufficient documentation

## 2019-10-13 DIAGNOSIS — R1011 Right upper quadrant pain: Secondary | ICD-10-CM | POA: Diagnosis not present

## 2019-10-13 DIAGNOSIS — R1013 Epigastric pain: Secondary | ICD-10-CM | POA: Diagnosis present

## 2019-10-13 LAB — URINALYSIS, ROUTINE W REFLEX MICROSCOPIC
Bilirubin Urine: NEGATIVE
Glucose, UA: NEGATIVE mg/dL
Hgb urine dipstick: NEGATIVE
Ketones, ur: NEGATIVE mg/dL
Leukocytes,Ua: NEGATIVE
Nitrite: NEGATIVE
Protein, ur: NEGATIVE mg/dL
Specific Gravity, Urine: 1.018 (ref 1.005–1.030)
pH: 5 (ref 5.0–8.0)

## 2019-10-13 LAB — COMPREHENSIVE METABOLIC PANEL
ALT: 24 U/L (ref 0–44)
AST: 17 U/L (ref 15–41)
Albumin: 3.9 g/dL (ref 3.5–5.0)
Alkaline Phosphatase: 144 U/L — ABNORMAL HIGH (ref 38–126)
Anion gap: 7 (ref 5–15)
BUN: 9 mg/dL (ref 6–20)
CO2: 23 mmol/L (ref 22–32)
Calcium: 8.9 mg/dL (ref 8.9–10.3)
Chloride: 105 mmol/L (ref 98–111)
Creatinine, Ser: 0.69 mg/dL (ref 0.44–1.00)
GFR calc Af Amer: >60 mL/min (ref >60)
GFR calc non Af Amer: >60 mL/min (ref >60)
Glucose, Bld: 90 mg/dL (ref 70–99)
Potassium: 4.2 mmol/L (ref 3.5–5.1)
Sodium: 135 mmol/L (ref 135–145)
Total Bilirubin: 0.4 mg/dL (ref 0.3–1.2)
Total Protein: 7.7 g/dL (ref 6.5–8.1)

## 2019-10-13 LAB — CBC WITH DIFFERENTIAL/PLATELET
Abs Immature Granulocytes: 0 10*3/uL (ref 0.00–0.07)
Basophils Absolute: 0 10*3/uL (ref 0.0–0.1)
Basophils Relative: 1 %
Eosinophils Absolute: 0.1 10*3/uL (ref 0.0–0.5)
Eosinophils Relative: 1 %
HCT: 32.2 % — ABNORMAL LOW (ref 36.0–46.0)
Hemoglobin: 9.2 g/dL — ABNORMAL LOW (ref 12.0–15.0)
Immature Granulocytes: 0 %
Lymphocytes Relative: 34 %
Lymphs Abs: 1.4 10*3/uL (ref 0.7–4.0)
MCH: 19.8 pg — ABNORMAL LOW (ref 26.0–34.0)
MCHC: 28.6 g/dL — ABNORMAL LOW (ref 30.0–36.0)
MCV: 69.4 fL — ABNORMAL LOW (ref 80.0–100.0)
Monocytes Absolute: 0.5 10*3/uL (ref 0.1–1.0)
Monocytes Relative: 12 %
Neutro Abs: 2.2 10*3/uL (ref 1.7–7.7)
Neutrophils Relative %: 52 %
Platelets: 240 10*3/uL (ref 150–400)
RBC: 4.64 MIL/uL (ref 3.87–5.11)
RDW: 17.2 % — ABNORMAL HIGH (ref 11.5–15.5)
WBC: 4.2 10*3/uL (ref 4.0–10.5)
nRBC: 0 % (ref 0.0–0.2)

## 2019-10-13 LAB — LIPASE, BLOOD: Lipase: 30 U/L (ref 11–51)

## 2019-10-13 LAB — TROPONIN I (HIGH SENSITIVITY)
Troponin I (High Sensitivity): <2 ng/L (ref <18)
Troponin I (High Sensitivity): <2 ng/L (ref <18)

## 2019-10-13 MED ORDER — ONDANSETRON 4 MG PO TBDP: 4.0000 mg | ORAL_TABLET | Freq: Three times a day (TID) | ORAL | 0 refills | Status: DC | PRN

## 2019-10-13 MED ORDER — DICYCLOMINE HCL 10 MG PO CAPS
10.0000 mg | ORAL_CAPSULE | Freq: Once | ORAL | Status: AC
  Administered 2019-10-13: 10 mg via ORAL
  Filled 2019-10-13: qty 1

## 2019-10-13 MED ORDER — ALUM & MAG HYDROXIDE-SIMETH 200-200-20 MG/5ML PO SUSP
15.0000 mL | Freq: Once | ORAL | Status: AC
  Administered 2019-10-13: 15 mL via ORAL
  Filled 2019-10-13: qty 30

## 2019-10-13 MED ORDER — FAMOTIDINE 20 MG PO TABS: 20.0000 mg | ORAL_TABLET | Freq: Every day | ORAL | 0 refills | Status: DC

## 2019-10-13 MED ORDER — DICYCLOMINE HCL 20 MG PO TABS
20.0000 mg | ORAL_TABLET | Freq: Three times a day (TID) | ORAL | 0 refills | Status: DC | PRN
Start: 2019-10-13 — End: 2020-06-29

## 2019-10-13 NOTE — Discharge Instructions (Signed)
You were seen in the emergency department today with abdominal and chest pain.  Your labs show no problem with your gallbladder.  Your heart labs were also normal.  Please keep the appointment with the gastroenterologist.  I have called in some prescription medicines to help with your symptoms.  You can continue taking the omeprazole and I have added the Pepcid.  The other medicines are to take as needed for symptoms.  Return to the emergency department with any worsening chest pain, abdominal pain, fevers, or other severe symptoms.

## 2019-10-13 NOTE — ED Provider Notes (Signed)
Emergency Department Provider Note   I have reviewed the triage vital signs and the nursing notes.   HISTORY  Chief Complaint Generalized Body Aches   HPI Charlotte Mccormick is a 34 y.o. female with PMH of GERD and asthma presents to the emergency department for evaluation of epigastric abdominal pain radiating up into the chest.   Patient tells me that her abdominal pain and chest discomfort have been going on for the past 10 days.  She describes a tight feeling in her upper abdomen and sometimes on the right.  No significant change with eating.  She has some mild nausea but no vomiting or diarrhea.  No fevers.  She was seen at urgent care recently with no improvement with GI cocktail.  She was referred to her PCP who saw her by telehealth this morning.  She was describing some of the symptoms and the patient was referred to the emergency department for evaluation of chest pain.  She has no prior history of ACS/PE.   In the past several days she has developed a more atypical type pain in her chest which is worse with movement.  She feels a sharp pulling feeling into her center chest and neck.  She has noticed this when she is lifting or moving only.   Past Medical History:  Diagnosis Date  . Abnormal uterine bleeding (AUB) 12/12/2015  . Anemia   . Anxiety   . Asthma   . Constipation   . Depression   . Fibroid   . Genital herpes   . GERD (gastroesophageal reflux disease)   . History of chlamydia   . Hyperemesis   . IBS (irritable bowel syndrome)   . Insomnia   . Mental disorder    PTSD, ANXIETY,Depression  . PTSD (post-traumatic stress disorder)   . Sinusitis   . Vaginal dryness 12/12/2015    Patient Active Problem List   Diagnosis Date Noted  . Panic disorder 10/11/2019  . Major depressive disorder, recurrent episode, mild ([place]) 01/22/2019  . GERD (gastroesophageal reflux disease) 09/14/2018  . Depression with anxiety 01/23/2017  . PTSD (post-traumatic stress  disorder)  05/01/2016  . Constipation 01/16/2016    Past Surgical History:  Procedure Laterality Date  . COLONOSCOPY     2016  . COLONOSCOPY N/A 04/08/2016   Procedure: COLONOSCOPY;  Surgeon: Danie Binder, MD;  [facility];  Service: Endoscopy;  Laterality: N/A;  10:15 AM  . ESOPHAGOGASTRODUODENOSCOPY N/A 04/08/2016   Procedure: ESOPHAGOGASTRODUODENOSCOPY (EGD);  Surgeon: Danie Binder, MD;  Location[facility];  Service: Endoscopy;  Laterality: N/A;  . PERINEUM REPAIR    . UPPER GASTROINTESTINAL ENDOSCOPY  2016    Allergies Bee venom, Penicillins, and Reglan [metoclopramide]  Family History  Problem Relation Age of Onset  . Heart disease Mother   . Hypertension Mother   . Hypertension Father   . Hyperlipidemia Father   . Diabetes Father   . Cancer Father   . Epilepsy Brother   . Eczema Daughter   . Colon cancer Neg Hx     Social History Social History   Tobacco Use  . Smoking status: Former Smoker    Packs/day: 0.50    Years: 5.00    Pack years: 2.50    Types: Cigarettes    Quit date: 06/03/2010    Years since quitting: 9.3  . Smokeless tobacco: Never Used  Substance Use Topics  . Alcohol use: Yes    Alcohol/week: 2.0 standard drinks  Types: 2 Glasses of wine per week    Comment: occ wine  . Drug use: Yes    Types: Marijuana    Review of Systems  Constitutional: No fever/chills Eyes: No visual changes. ENT: No sore throat. Cardiovascular: Positive chest pain. Respiratory: Denies shortness of breath. Gastrointestinal: Positive epigastric abdominal pain. Positive nausea, no vomiting.  No diarrhea.  No constipation. Genitourinary: Negative for dysuria. Musculoskeletal: Negative for back pain. Skin: Negative for rash. Neurological: Negative for headaches, focal weakness or numbness.  10-point ROS otherwise negative.  ____________________________________________   PHYSICAL EXAM:  VITAL SIGNS: ED Triage Vitals  Enc Vitals Group      BP 10/13/19 1145 116/80     Pulse Rate 10/13/19 1145 85     Resp 10/13/19 1145 18     Temp 10/13/19 1145 98.3 F (36.8 C)     Temp Source 10/13/19 1145 Oral     SpO2 10/13/19 1145 98 %     Weight 10/13/19 1140 190 lb (86.2 kg)     Height 10/13/19 1140 5\' 2"  (1.575 m)   Constitutional: Alert and oriented. Well appearing and in no acute distress. Eyes: Conjunctivae are normal. Head: Atraumatic. Nose: No congestion/rhinnorhea. Mouth/Throat: Mucous membranes are moist.  Neck: No stridor.  Cardiovascular: Normal rate, regular rhythm. Good peripheral circulation. Grossly normal heart sounds.   Respiratory: Normal respiratory effort.  No retractions. Lungs CTAB. Gastrointestinal: Soft and nontender. Minimal epigastric tenderness. No RUQ tenderness or Murphy's sign. No lower abdominal tenderness. No distention.  Musculoskeletal: No gross deformities of extremities. Neurologic:  Normal speech and language. No gross focal neurologic deficits are appreciated.  Skin:  Skin is warm, dry and intact. No rash noted.  ____________________________________________   LABS (all labs ordered are listed, but only abnormal results are displayed)  Labs Reviewed  COMPREHENSIVE METABOLIC PANEL - Abnormal; Notable for the following components:      Result Value   Alkaline Phosphatase 144 (*)    All other components within normal limits  CBC WITH DIFFERENTIAL/PLATELET - Abnormal; Notable for the following components:   Hemoglobin 9.2 (*)    HCT 32.2 (*)    MCV 69.4 (*)    MCH 19.8 (*)    MCHC 28.6 (*)    RDW 17.2 (*)    All other components within normal limits  URINE CULTURE  LIPASE, BLOOD  URINALYSIS, ROUTINE W REFLEX MICROSCOPIC  I-STAT BETA HCG BLOOD, ED ([facility], [facility], [facility] ONLY)  TROPONIN I (HIGH SENSITIVITY)  TROPONIN I (HIGH SENSITIVITY)   ____________________________________________  EKG  EKG reviewed. Normal sinus rhythm. Narrow QRS. No ST elevation or depression. Tracing not crossing to  MUSE.  ____________________________________________  RADIOLOGY  DG Chest Portable 1 View  Result Date: 10/13/2019 CLINICAL DATA:  Chest pain for the past 2 weeks. EXAM: PORTABLE CHEST 1 VIEW COMPARISON:  Chest x-ray dated December 04, 2016. FINDINGS: The heart size and mediastinal contours are within normal limits. Normal pulmonary vascularity. Unchanged minimal bibasilar atelectasis/scarring. No focal consolidation, pleural effusion, or pneumothorax. No acute osseous abnormality. IMPRESSION: No active disease. Electronically Signed   By: Titus Dubin M.D.   On: 10/13/2019 12:28   US Abdomen Limited RUQ  Result Date: 10/13/2019 CLINICAL DATA:  Right upper quadrant abdominal pain. Additional history provided by scanning technologist: Right upper quadrant pain with chest pain for 10 days. EXAM: ULTRASOUND ABDOMEN LIMITED RIGHT UPPER QUADRANT COMPARISON:  Abdominal ultrasound 08/15/2016. FINDINGS: Gallbladder: No gallstones or wall thickening visualized. No sonographic Murphy sign noted by sonographer. Common bile  duct: Diameter: 2 mm, within normal limits. Liver: No focal lesion identified. Mildly increased hepatic parenchymal echogenicity. Portal vein is patent on color Doppler imaging with normal direction of blood flow towards the liver. IMPRESSION: Mild hyperechogenicity of the hepatic parenchyma. This is a nonspecific finding, which may be seen in the setting of hepatic steatosis or other chronic hepatic parenchymal disease. Otherwise unremarkable right upper quadrant ultrasound, as described. Electronically Signed   By: Kellie Simmering DO   On: 10/13/2019 12:52    ____________________________________________   PROCEDURES  Procedure(s) performed:   Procedures  None ____________________________________________   INITIAL IMPRESSION / ASSESSMENT AND PLAN / ED COURSE  Pertinent labs & imaging results that were available during my care of the patient were reviewed by me and considered in my  medical decision making (see chart for details).   Patient presents to the emergency department for evaluation of epigastric pain and chest discomfort over the past 10 days.  The more recent chest pain which is sharp and worse with movement seems very musculoskeletal.  I suspect the patient's symptoms are primarily emanating from her GI system.  Possible gastritis.  No focal right upper quadrant tenderness but plan for LFTs, bilirubin, right upper quadrant ultrasound given lingering symptoms.  02:00 PM  Ultrasound and chest x-ray are largely unremarkable LFTs are within normal limits.   Repeat troponin negative. Plan for GI follow up and symptom mgmt at home. Doubt ACS/PE. Discussed ED return precautions. Patient to call PCP for follow up as well.  ____________________________________________  FINAL CLINICAL IMPRESSION(S) / ED DIAGNOSES  Final diagnoses:  RUQ abdominal pain  Precordial chest pain     MEDICATIONS GIVEN DURING THIS VISIT:  Medications  alum & mag hydroxide-simeth (MAALOX/MYLANTA) 200-200-20 MG/5ML suspension 15 mL (15 mLs Oral Given 10/13/19 1426)  dicyclomine (BENTYL) capsule 10 mg (10 mg Oral Given 10/13/19 1426)     NEW OUTPATIENT MEDICATIONS STARTED DURING THIS VISIT:  Discharge Medication List as of 10/13/2019  3:21 PM    START taking these medications   Details  dicyclomine (BENTYL) 20 MG tablet Take 1 tablet (20 mg total) by mouth 3 (three) times daily as needed for spasms (abdominal cramping)., Starting Wed 10/13/2019, Normal    famotidine (PEPCID) 20 MG tablet Take 1 tablet (20 mg total) by mouth daily., Starting Wed 10/13/2019, Until Fri 11/12/2019, Normal    ondansetron (ZOFRAN ODT) 4 MG disintegrating tablet Take 1 tablet (4 mg total) by mouth every 8 (eight) hours as needed., Starting Wed 10/13/2019, Normal        Note:  This document was prepared using Dragon voice recognition software and may include unintentional dictation errors.  Nanda Quinton, MD,  [facility] Emergency Medicine    Treyson Axel, [facility], MD 10/13/19 2105

## 2019-10-13 NOTE — ED Triage Notes (Signed)
Pt reports back pain, abdominal pain, chest pain x1.5 weeks. Pt reports neck pain started a few days ago. Pt reports has been seen at urgent care a week ago and reports was told if symptoms persisted to follow up with pcp. Pt reports had tele-visit with pcp this morning and was told to come to ED for evaluation of heart, thyroid, and gallbladder. nad noted. Pt reports pain is worse with movement and activity.

## 2019-10-14 LAB — URINE CULTURE

## 2019-10-15 LAB — VITAMIN D 25 HYDROXY (VIT D DEFICIENCY, FRACTURES): Vit D, 25-Hydroxy: 17.4

## 2019-10-15 LAB — BASIC METABOLIC PANEL
BUN: 7 (ref 4–21)
Creatinine: 0.7 (ref 0.5–1.1)

## 2019-10-15 LAB — HEMOGLOBIN A1C: Hemoglobin A1C: 5.2

## 2019-10-15 LAB — COMPREHENSIVE METABOLIC PANEL
GFR calc Af Amer: 123
GFR calc non Af Amer: 107

## 2019-10-15 LAB — LIPID PANEL
Cholesterol: 146 (ref 0–200)
HDL: 42 (ref 35–70)
LDL Cholesterol: 92
Triglycerides: 56 (ref 40–160)

## 2019-10-15 LAB — TSH: TSH: 0.37 — AB (ref 0.41–5.90)

## 2019-10-29 ENCOUNTER — Telehealth: Payer: Self-pay | Admitting: Gastroenterology

## 2019-10-29 ENCOUNTER — Ambulatory Visit
Admission: EM | Admit: 2019-10-29 | Discharge: 2019-10-29 | Disposition: A | Discharge status: Home / Self Care | Payer: 59 | Attending: Emergency Medicine | Admitting: Emergency Medicine

## 2019-10-29 ENCOUNTER — Telehealth: Payer: Self-pay | Admitting: Emergency Medicine

## 2019-10-29 ENCOUNTER — Other Ambulatory Visit: Payer: Self-pay

## 2019-10-29 DIAGNOSIS — K219 Gastro-esophageal reflux disease without esophagitis: Secondary | ICD-10-CM | POA: Diagnosis not present

## 2019-10-29 MED ORDER — ALUM & MAG HYDROXIDE-SIMETH 200-200-20 MG/5ML PO SUSP
30.0000 mL | Freq: Once | ORAL | Status: AC
  Administered 2019-10-29: 30 mL via ORAL

## 2019-10-29 MED ORDER — GI COCKTAIL ~~LOC~~: 30.0000 mL | Freq: Two times a day (BID) | ORAL | 4 refills | Status: DC

## 2019-10-29 NOTE — Telephone Encounter (Signed)
Called pt verified name and dob Pt stated she has been having pain in her throat for about 3 weeks denies any fevers or chills She states she has been using otc throat spray and taking Prilosec and Pepcid with no relief pt stated she is going to go to urgent care

## 2019-10-29 NOTE — ED Provider Notes (Signed)
[name]   [id number] 10/29/19 Arrival Time: 0905  CC: Acid reflux  SUBJECTIVE:  Charlotte Mccormick is a 34 y.o. female who presents with complaint of acid reflux x few months, worsened last night.  Denies a precipitating event, or trigger.  Localizes pain to throat.  Describes as constant and discomfort in character.  Has tried OTC medications without relief.  Worsening factors with eating pizza, but has cut back pizza consumption to once a month.  Reports similar symptoms in the past with acid reflux.  Had an EGD in the past and had to be dilated.  Complains of associated nausea and vomiting as well as cough.    Denies fever, chills, bloody emesis, chest pain, SOB, diarrhea, constipation, hematochezia, melena, dysuria, difficulty urinating, increased frequency or urgency, flank pain, loss of bowel or bladder function.  Patient's last menstrual period was 10/13/2019.  ROS: As per HPI.  All other pertinent ROS negative.     Past Medical History:  Diagnosis Date  . Abnormal uterine bleeding (AUB) 12/12/2015  . Anemia   . Anxiety   . Asthma   . Constipation   . Depression   . Fibroid   . Genital herpes   . GERD (gastroesophageal reflux disease)   . History of chlamydia   . Hyperemesis   . IBS (irritable bowel syndrome)   . Insomnia   . Mental disorder    PTSD, ANXIETY,Depression  . PTSD (post-traumatic stress disorder)   . Sinusitis   . Vaginal dryness 12/12/2015   Past Surgical History:  Procedure Laterality Date  . COLONOSCOPY     2016  . COLONOSCOPY N/A 04/08/2016   Procedure: COLONOSCOPY;  Surgeon: Danie Binder, MD;  [facility];  Service: Endoscopy;  Laterality: N/A;  10:15 AM  . ESOPHAGOGASTRODUODENOSCOPY N/A 04/08/2016   Procedure: ESOPHAGOGASTRODUODENOSCOPY (EGD);  Surgeon: Danie Binder, MD;  Location: [facility];  Service: Endoscopy;  Laterality: N/A;  . PERINEUM REPAIR    . UPPER GASTROINTESTINAL ENDOSCOPY  2016   Allergies    Allergen Reactions  . Bee Venom Swelling and Other (See Comments)    Reaction:  Localized swelling   . Penicillins Itching and Other (See Comments)    Has patient had a PCN reaction causing immediate rash, facial/tongue/throat swelling, SOB or lightheadedness with hypotension: No Has patient had a PCN reaction causing severe rash involving mucus membranes or skin necrosis: No Has patient had a PCN reaction that required hospitalization No Has patient had a PCN reaction occurring within the last 10 years: Yes If all of the above answers are "NO", then may proceed with Cephalosporin use.   . Reglan [Metoclopramide] Anxiety   No current facility-administered medications on file prior to encounter.   Current Outpatient Medications on File Prior to Encounter  Medication Sig Dispense Refill  . acetaminophen (TYLENOL) 500 MG tablet Take 500 mg by mouth every 6 (six) hours as needed for mild pain or moderate pain.    Marland Kitchen albuterol (VENTOLIN HFA) 108 (90 Base) MCG/ACT inhaler Inhale 1 puff into the lungs every 6 (six) hours as needed for wheezing or shortness of breath.    Marland Kitchen buPROPion (WELLBUTRIN XL) 300 MG 24 hr tablet Take 1 tablet (300 mg total) by mouth daily. 30 tablet 2  . diazepam (VALIUM) 5 MG tablet Take 1 tablet (5 mg total) by mouth every 8 (eight) hours as needed for anxiety. 90 tablet 1  . dicyclomine (BENTYL) 20 MG tablet Take 1 tablet (20 mg  total) by mouth 3 (three) times daily as needed for spasms (abdominal cramping). 20 tablet 0  . famotidine (PEPCID) 20 MG tablet Take 1 tablet (20 mg total) by mouth daily. 30 tablet 0  . meloxicam (MOBIC) 15 MG tablet TAKE 1 TABLET BY MOUTH ONCE DAILY FOR PAIN    . omeprazole (PRILOSEC) 20 MG capsule Take 1 capsule (20 mg total) by mouth daily. 30 capsule 0  . ondansetron (ZOFRAN ODT) 4 MG disintegrating tablet Take 1 tablet (4 mg total) by mouth every 8 (eight) hours as needed. 20 tablet 0  . prazosin (MINIPRESS) 5 MG capsule Take 1 capsule (5 mg  total) by mouth at bedtime. 30 capsule 2  . rizatriptan (MAXALT) 10 MG tablet Take 1 tablet by mouth daily as needed. At onset of headache    . Vitamin D, Ergocalciferol, (DRISDOL) 1.25 MG (50000 UNIT) CAPS capsule Take 50,000 Units by mouth once a week. Hasn't taken this since shes forgot but states shes on this medication    . [DISCONTINUED] LO LOESTRIN FE 1 MG-10 MCG / 10 MCG tablet Take 1 tablet by mouth daily. (Patient not taking: Reported on 10/13/2019) 3 Package 3  . [DISCONTINUED] zolpidem (AMBIEN CR) 12.5 MG CR tablet Take 1 tablet (12.5 mg total) by mouth at bedtime as needed for sleep. (Patient not taking: Reported on 10/13/2019) 30 tablet 2   Social History   Socioeconomic History  . Marital status: Married    Spouse name: Not on file  . Number of children: Not on file  . Years of education: Not on file  . Highest education level: Not on file  Occupational History  . Not on file  Tobacco Use  . Smoking status: Former Smoker    Packs/day: 0.50    Years: 5.00    Pack years: 2.50    Types: Cigarettes    Quit date: 06/03/2010    Years since quitting: 9.4  . Smokeless tobacco: Never Used  Substance and Sexual Activity  . Alcohol use: Yes    Alcohol/week: 2.0 standard drinks    Types: 2 Glasses of wine per week    Comment: occ wine  . Drug use: Yes    Types: Marijuana  . Sexual activity: Yes    Birth control/protection: Pill  Other Topics Concern  . Not on file  Social History Narrative   ** Merged History Encounter **       Social Determinants of Health   Financial Resource Strain:   . Difficulty of Paying Living Expenses:   Food Insecurity:   . Worried About Charity fundraiser in the Last Year:   . Arboriculturist in the Last Year:   Transportation Needs:   . Film/video editor (Medical):   Marland Kitchen Lack of Transportation (Non-Medical):   Physical Activity:   . Days of Exercise per Week:   . Minutes of Exercise per Session:   Stress:   . Feeling of Stress :     Social Connections:   . Frequency of Communication with Friends and Family:   . Frequency of Social Gatherings with Friends and Family:   . Attends Religious Services:   . Active Member of Clubs or Organizations:   . Attends Archivist Meetings:   Marland Kitchen Marital Status:   Intimate Partner Violence:   . Fear of Current or Ex-Partner:   . Emotionally Abused:   Marland Kitchen Physically Abused:   . Sexually Abused:    Family History  Problem  Relation Age of Onset  . Heart disease Mother   . Hypertension Mother   . Hypertension Father   . Hyperlipidemia Father   . Diabetes Father   . Cancer Father   . Epilepsy Brother   . Eczema Daughter   . Colon cancer Neg Hx      OBJECTIVE:  Vitals:   10/29/19 0917  BP: 129/85  Pulse: 89  Resp: 18  Temp: 98.5 F (36.9 C)  SpO2: 96%    General appearance: Alert; NAD HEENT: NCAT.  Oropharynx clear.  Lungs: clear to auscultation bilaterally without adventitious breath sounds; persistent cough Heart: regular rate and rhythm.   Abdomen: soft, non-distended; normal active bowel sounds; non-tender to light and deep palpation; nontender at McBurney's point; no guarding Extremities: no edema; symmetrical with no gross deformities Skin: warm and dry Neurologic: normal gait Psychological: alert and cooperative; normal mood and affect   ASSESSMENT & PLAN:  1. Gastroesophageal reflux disease, unspecified whether esophagitis present     Meds ordered this encounter  Medications  . Alum & Mag Hydroxide-Simeth (GI COCKTAIL) SUSP suspension    Sig: Take 30 mLs by mouth 2 (two) times daily. Shake well.    Dispense:  300 mL    Refill:  4    Order Specific Question:   Supervising Provider    Answer:   Raylene Everts [id number]  . alum & mag hydroxide-simeth (MAALOX/MYLANTA) 200-200-20 MG/5ML suspension 30 mL    GI cocktail given in office and prescribed take as directed Avoid eating 2-3 hours before bed Elevate head of bed.  Avoid  chocolate, caffeine, alcohol, onion, and mint prior to bed.  This relaxes the bottom part of your esophagus and can make your symptoms worse.  Follow up with PCP for further evaluation and management Return or go to the ED if you have any new or worsening symptoms fever, chills, nausea, vomiting, abdominal pain, worsening symptoms, etc...  Reviewed expectations re: course of current medical issues. Questions answered. Outlined signs and symptoms indicating need for more acute intervention. Patient verbalized understanding. After Visit Summary given.   Lestine Box, PA-C 10/29/19 [phone]

## 2019-10-29 NOTE — Telephone Encounter (Signed)
Pt called asking for something to be called into [place] to help her throat. She said it feels like it's ripped open, burning, she can't swallow and her voice is raspby. She is aware of her telephone appt on Tuesday at 3pm but is requesting something for relief asap. Please advise. [phone]

## 2019-10-29 NOTE — Telephone Encounter (Signed)
Error see previous note .  °

## 2019-10-29 NOTE — ED Triage Notes (Signed)
Pt presents with c/o ongoing concerns with sore throat from acid reflux

## 2019-10-29 NOTE — Discharge Instructions (Signed)
GI cocktail given in office and prescribed take as directed Avoid eating 2-3 hours before bed Elevate head of bed.  Avoid chocolate, caffeine, alcohol, onion, and mint prior to bed.  This relaxes the bottom part of your esophagus and can make your symptoms worse.  Follow up with PCP for further evaluation and management Return or go to the ED if you have any new or worsening symptoms fever, chills, nausea, vomiting, abdominal pain, worsening symptoms, etc..Marland Kitchen

## 2019-11-02 ENCOUNTER — Encounter: Payer: Self-pay | Admitting: Nurse Practitioner

## 2019-11-02 ENCOUNTER — Other Ambulatory Visit: Payer: Self-pay

## 2019-11-02 ENCOUNTER — Ambulatory Visit (INDEPENDENT_AMBULATORY_CARE_PROVIDER_SITE_OTHER): Payer: 59 | Admitting: Nurse Practitioner

## 2019-11-02 ENCOUNTER — Telehealth: Payer: Self-pay | Admitting: *Deleted

## 2019-11-02 DIAGNOSIS — K5909 Other constipation: Secondary | ICD-10-CM

## 2019-11-02 DIAGNOSIS — K219 Gastro-esophageal reflux disease without esophagitis: Secondary | ICD-10-CM | POA: Diagnosis not present

## 2019-11-02 MED ORDER — LINACLOTIDE 72 MCG PO CAPS: 72.0000 ug | ORAL_CAPSULE | Freq: Every day | ORAL | 5 refills | Status: DC

## 2019-11-02 NOTE — Telephone Encounter (Signed)
Pt consented to a telephone visit. °

## 2019-11-02 NOTE — Telephone Encounter (Signed)
Charlotte Mccormick, you are scheduled for a virtual visit with your provider today.  Just as we do with appointments in the office, we must obtain your consent to participate.  Your consent will be active for this visit and any virtual visit you may have with one of our providers in the next 365 days.  If you have a MyChart account, I can also send a copy of this consent to you electronically.  All virtual visits are billed to your insurance company just like a traditional visit in the office.  As this is a virtual visit, video technology does not allow for your provider to perform a traditional examination.  This may limit your provider's ability to fully assess your condition.  If your provider identifies any concerns that need to be evaluated in person or the need to arrange testing such as labs, EKG, etc, we will make arrangements to do so.  Although advances in technology are sophisticated, we cannot ensure that it will always work on either your end or our end.  If the connection with a video visit is poor, we may have to switch to a telephone visit.  With either a video or telephone visit, we are not always able to ensure that we have a secure connection.   I need to obtain your verbal consent now.   Are you willing to proceed with your visit today?

## 2019-11-02 NOTE — Progress Notes (Signed)
Referring Provider: Denyce Robert, FNP Primary Care Physician:  Denyce Robert, FNP Primary GI:  Dr. Gala Romney (in the absence of Dr. Oneida Alar); pending Dr. Abbey Chatters  NOTE: Service was provided via telemedicine and was requested by the patient due to COVID-19 pandemic.  Patient Location: Home  Provider Location: [place] office  Reason for Phone Visit: GERD  Persons present on the phone encounter, with roles: Patient, myself (provider), Christ Kick (updated meds and allergies)  Total time (minutes) spent on medical discussion: 23 minutes  Due to COVID-19, visit was conducted using the virtual method noted. Visit was requested by patient.  I connected with Charlotte Mccormick on 11/02/19 at  3:00 PM EDT by Telephone and verified that I am speaking with the correct person using two identifiers.   I discussed the limitations, risks, security and privacy concerns of performing an evaluation and management service by telephone and the availability of in person appointments. I also discussed with the patient that there may be a patient responsible charge related to this service. The patient expressed understanding and agreed to proceed.  Chief Complaint  Patient presents with   Chest Pain    thinks it may be reflux    HPI:   Charlotte Mccormick is a 34 y.o. female who presents for virtual visit regarding: GERD.  The patient was last seen in our office 09/14/2018 for constipation and GERD.  Her last visit was a virtual office visit due to COVID-19/coronavirus pandemic.  Constipation generally well managed with MiraLAX.  GERD symptoms not ideally controlled by gynecology.  At her last visit she noted daily GERD symptoms especially if she lays down, symptoms mostly at night.  Last meal the day about 11:30 PM and typically goes to bed around 1 AM.  Symptoms worse with carbonated beverages, fried foods, pizza, spaghetti, chocolate.  She tries to avoid spicy foods and tries to avoid triggers  "as much as possible but are not the best that it."  She has not been taking a PPI.  Significant constipation with a bowel movement every 1-1/2 to 2 weeks, prior to pregnancy on Linzess 72 mcg which worked well but during pregnancy had to be on MiraLAX.  No other overt GI complaints.  Recommended restart Linzess 72 mcg daily, switch back to omeprazole 20 mg twice a day which is more effective (prior to pregnancy), printed information, dietary changes/trigger avoidance, follow-up in 2 months.  It appears the patient did not follow-up as recommended.  She was seen in urgent care 10/29/2019 for GERD.  At that time she complained of reflux for a few months worsens at night, no obvious triggers, primarily manifest as throat pain.  Has tried over-the-counter medications without relief.  EGD in the past and had to be dilated.  Her home medication lists Pepcid and Prilosec, but unsure if she has been taking those.  She was given GI cocktail twice daily for home, was given a dose in the office.  Avoid eating 2 to 3 hours prior to bed, elevate the head of the bed, avoid known triggers.  Today she states she's doing ok overall. She has been having abdominal pain and chest pain. In the ER ay [facility] was told they could see where she had inflammation. States they could see stomach inflammation on her U/S (though review of imaging and ER notes indicate unremarkable exam.) Her GERD started flaring up in early May. Denies solid food dysphagia. Having throat pain. Abdominal pain is located lower abdomen, crampy. Has a  bowel movement every 2-3 weeks; previously on Linzess but not anymore. Is on Mylanta, which isn't helping her symptoms. Stools require straining. Has some nausea (Zofran helps) and vomiting. They added Pepcid to Prilosec. Feels this only causes acid to build up and after several days she will vomit the acid. States she's been on every acid medication. She has previously been on Omeprazole and Pantoprazole as  well as Pepcid. Denies hematochezia, melena, fever, chills. Has had unintentional weight loss of about 20 lbs in 2 weeks; seeing endocrinologist (or has an appointment to) and they feel it's something to do with her thyroid. Denies URI or flu-like symptoms. Denies loss of sense of taste or smell. The patient has received COVID-19 vaccination(s). Denies chest pain, dyspnea, dizziness, lightheadedness, syncope, near syncope. Denies any other upper or lower GI symptoms.  Past Medical History:  Diagnosis Date   Abnormal uterine bleeding (AUB) 12/12/2015   Anemia    Anxiety    Asthma    Constipation    Depression    Fibroid    Genital herpes    GERD (gastroesophageal reflux disease)    History of chlamydia    Hyperemesis    IBS (irritable bowel syndrome)    Insomnia    Mental disorder    PTSD, ANXIETY,Depression   PTSD (post-traumatic stress disorder)    Sinusitis    Vaginal dryness 12/12/2015    Past Surgical History:  Procedure Laterality Date   COLONOSCOPY     2016   COLONOSCOPY N/A 04/08/2016   Procedure: COLONOSCOPY;  Surgeon: Danie Binder, MD;  [facility];  Service: Endoscopy;  Laterality: N/A;  10:15 AM   ESOPHAGOGASTRODUODENOSCOPY N/A 04/08/2016   Procedure: ESOPHAGOGASTRODUODENOSCOPY (EGD);  Surgeon: Danie Binder, MD;  [facility];  Service: Endoscopy;  Laterality: N/A;   PERINEUM REPAIR     UPPER GASTROINTESTINAL ENDOSCOPY  2016    Current Outpatient Medications  Medication Sig Dispense Refill   acetaminophen (TYLENOL) 500 MG tablet Take 500 mg by mouth every 6 (six) hours as needed for mild pain or moderate pain.     albuterol (VENTOLIN HFA) 108 (90 Base) MCG/ACT inhaler Inhale 1 puff into the lungs every 6 (six) hours as needed for wheezing or shortness of breath.     buPROPion (WELLBUTRIN XL) 300 MG 24 hr tablet Take 1 tablet (300 mg total) by mouth daily. 30 tablet 2   diazepam (VALIUM) 5 MG tablet Take 1 tablet (5  mg total) by mouth every 8 (eight) hours as needed for anxiety. 90 tablet 1   dicyclomine (BENTYL) 20 MG tablet Take 1 tablet (20 mg total) by mouth 3 (three) times daily as needed for spasms (abdominal cramping). 20 tablet 0   famotidine (PEPCID) 20 MG tablet Take 1 tablet (20 mg total) by mouth daily. 30 tablet 0   meloxicam (MOBIC) 15 MG tablet TAKE 1 TABLET BY MOUTH ONCE DAILY FOR PAIN     omeprazole (PRILOSEC) 20 MG capsule Take 1 capsule (20 mg total) by mouth daily. 30 capsule 0   ondansetron (ZOFRAN ODT) 4 MG disintegrating tablet Take 1 tablet (4 mg total) by mouth every 8 (eight) hours as needed. 20 tablet 0   prazosin (MINIPRESS) 5 MG capsule Take 1 capsule (5 mg total) by mouth at bedtime. 30 capsule 2   rizatriptan (MAXALT) 10 MG tablet Take 1 tablet by mouth daily as needed. At onset of headache     VITAMIN D PO Take by mouth daily.  Vitamin D, Ergocalciferol, (DRISDOL) 1.25 MG (50000 UNIT) CAPS capsule Take 50,000 Units by mouth once a week. Hasn't taken this since shes forgot but states shes on this medication     No current facility-administered medications for this visit.    Allergies as of 11/02/2019 - Review Complete 11/02/2019  Allergen Reaction Noted   Bee venom Swelling and Other (See Comments) 04/12/2013   Penicillins Itching and Other (See Comments) 04/02/2016   Reglan [metoclopramide] Anxiety 07/22/2016    Family History  Problem Relation Age of Onset   Heart disease Mother    Hypertension Mother    Hypertension Father    Hyperlipidemia Father    Diabetes Father    Cancer Father    Epilepsy Brother    Eczema Daughter    Colon cancer Neg Hx     Social History   Socioeconomic History   Marital status: Married    Spouse name: Not on file   Number of children: Not on file   Years of education: Not on file   Highest education level: Not on file  Occupational History   Not on file  Tobacco Use   Smoking status: Former  Smoker    Packs/day: 0.50    Years: 5.00    Pack years: 2.50    Types: Cigarettes    Quit date: 06/03/2010    Years since quitting: 9.4   Smokeless tobacco: Never Used  Substance and Sexual Activity   Alcohol use: Not Currently    Alcohol/week: 2.0 standard drinks    Types: 2 Glasses of wine per week    Comment: occ wine; denied 11/02/19   Drug use: Yes    Types: Marijuana    Comment: 11/02/19 states she used gummies 1-2 times a month.   Sexual activity: Yes    Birth control/protection: Pill  Other Topics Concern   Not on file  Social History Narrative   ** Merged History Encounter **       Social Determinants of Health   Financial Resource Strain:    Difficulty of Paying Living Expenses:   Food Insecurity:    Worried About Charity fundraiser in the Last Year:    Arboriculturist in the Last Year:   Transportation Needs:    Film/video editor (Medical):    Lack of Transportation (Non-Medical):   Physical Activity:    Days of Exercise per Week:    Minutes of Exercise per Session:   Stress:    Feeling of Stress :   Social Connections:    Frequency of Communication with Friends and Family:    Frequency of Social Gatherings with Friends and Family:    Attends Religious Services:    Active Member of Clubs or Organizations:    Attends Music therapist:    Marital Status:     Review of Systems: Review of Systems  Constitutional: Negative for chills, fever, malaise/fatigue and weight loss.  HENT: Negative for congestion and sore throat.   Respiratory: Negative for cough and shortness of breath.   Cardiovascular: Negative for chest pain and palpitations.  Gastrointestinal: Negative for abdominal pain, blood in stool, diarrhea, melena, nausea and vomiting.  Musculoskeletal: Negative for joint pain and myalgias.  Skin: Negative for rash.  Neurological: Negative for dizziness and weakness.  Endo/Heme/Allergies: Does not bruise/bleed easily.    Psychiatric/Behavioral: Negative for depression. The patient is not nervous/anxious.   All other systems reviewed and are negative.   Physical Exam: Note: limited  exam due to virtual visit LMP 10/13/2019 (Approximate)  Physical Exam Nursing note reviewed.  Constitutional:      General: She is not in acute distress.    Comments: No obvious distress heard on phone visit.  HENT:     Nose: No congestion.     Comments: No obvious congestion heard on phone visit Pulmonary:     Effort: No respiratory distress.     Comments: No obvious respiratory distress heard on phone visit Neurological:     General: No focal deficit present.     Mental Status: She is alert and oriented to person, place, and time.     Comments: No obvious speech focal deficit heard on phone visit  Psychiatric:        Attention and Perception: Attention normal.        Mood and Affect: Mood normal.        Speech: Speech normal.        Behavior: Behavior normal.        Thought Content: Thought content normal.        Cognition and Memory: Cognition and memory normal.

## 2019-11-02 NOTE — Patient Instructions (Signed)
Your health issues we discussed today were:   GERD (reflux/heartburn): 1. I am giving you samples of Dexilant.  Take this once a day, first thing in the morning on an empty stomach 2. I was hoping to be able to give you a few boxes of samples, unfortunately we only have 1 box left in our office. 3. When you start taking Dexilant, start taking omeprazole 4. Call us in 1 week and let us know if Dexilant is helping your symptoms any better.  If it helps we consider prescription to your pharmacy 5. We will schedule an upper endoscopy for you 6. Further recommendations will follow  Constipation and lower abdominal pain: 1. I have sent a prescription back to your pharmacy to restart Linzess 72 mcg daily, which worked well for you previously 2. Call us if you have any worsening or severe symptoms  Overall I recommend:  1. Continue your other current medications 2. Return for follow-up in 4 months 3. Call us if you have any questions or concerns.   ---------------------------------------------------------------  I am glad you have gotten your COVID-19 vaccination!  Even though you are fully vaccinated you should continue to follow CDC and state/local guidelines.  ---------------------------------------------------------------   At [facility] we value your feedback. You may receive a survey about your visit today. Please share your experience as we strive to create trusting relationships with our patients to provide genuine, compassionate, quality care.  We appreciate your understanding and patience as we review any laboratory studies, imaging, and other diagnostic tests that are ordered as we care for you. Our office policy is 5 business days for review of these results, and any emergent or urgent results are addressed in a timely manner for your best interest. If you do not hear from our office in 1 week, please contact us.   We also encourage the use of MyChart, which  contains your medical information for your review as well. If you are not enrolled in this feature, an access code is on this after visit summary for your convenience. Thank you for allowing [place] to be involved in your care.  It was great to see you today!  I hope you have a great Summer!!

## 2019-11-03 ENCOUNTER — Encounter: Payer: Self-pay | Admitting: Internal Medicine

## 2019-11-03 NOTE — Telephone Encounter (Signed)
Noted. See follow-up OV recently for further details

## 2019-11-08 ENCOUNTER — Telehealth: Payer: Self-pay | Admitting: Nurse Practitioner

## 2019-11-08 MED ORDER — DEXLANSOPRAZOLE 60 MG PO CPDR
60.0000 mg | DELAYED_RELEASE_CAPSULE | Freq: Every day | ORAL | 3 refills | Status: DC
Start: 2019-11-08 — End: 2023-06-26

## 2019-11-08 NOTE — Addendum Note (Signed)
Addended by: Annitta Needs on: 11/08/2019 02:32 PM   Modules accepted: Orders

## 2019-11-08 NOTE — Telephone Encounter (Signed)
Completed. Routing to [employer] as Juluis Rainier.

## 2019-11-08 NOTE — Telephone Encounter (Signed)
Pt was seen on 11/02/19 and would like Dexilant 60 mg daily, sent to he pharmacy. Pt feels Dexilant is working well.

## 2019-11-08 NOTE — Telephone Encounter (Signed)
PATIENT CALLED AND SAID THE [place] SAMPLES WORKED WELL AND SHE WOULD LIKE A PRESCRIPTION SENT TO [place] IN [place]

## 2019-11-10 NOTE — Telephone Encounter (Signed)
Noted, thanks!

## 2019-12-08 NOTE — Assessment & Plan Note (Signed)
Persistent constipation with bowel movement every 2 to 3 weeks, previously on Linzess but not anymore.  Mylanta not helping symptoms.  Has straining, lower abdominal crampy pain and some nausea.  Recommended Linzess 72 mcg daily, samples provided.  Request progress for 1 to 2 weeks.  Follow-up in 4 months.

## 2019-12-08 NOTE — Assessment & Plan Note (Signed)
Worsening GERD symptoms including substernal reflux symptoms/heartburn.  This is been ongoing since May, no solid food dysphagia.  Does have throat pain.  Previously on Prilosec and Pepcid was added.  Feels this only cause her symptoms to build up until she vomits acid.  Previously on omeprazole and pantoprazole as well as Pepcid.  At this point recommend Dexilant samples, request progress for 1 week.  Follow-up in 4 months.  Given persistent, ongoing symptoms despite multiple PPI therapies we will plan for an EGD to further evaluate as well.  Further recommendations to follow.  Proceed with EGD with Dr. Abbey Chatters on propofol/MAC in near future: the risks, benefits, and alternatives have been discussed with the patient in detail. The patient states understanding and desires to proceed.  The patient is currently on Wellbutrin, Valium.The patient is not on any other anticoagulants, anxiolytics, chronic pain medications, antidepressants, antidiabetics, or iron supplements.  Denies alcohol.  Uses marijuana 1-2 times a month.  We will plan for the procedure on propofol/MAC to promote adequate sedation.  ASA II (mild lung disease).

## 2019-12-09 ENCOUNTER — Telehealth: Payer: Self-pay | Admitting: *Deleted

## 2019-12-09 NOTE — Telephone Encounter (Signed)
Called pt and scheduled EGD with [facility] with propofol for 01/17/2020 at 8:15am. Patient aware will need COVID testing prior and this will be included with her instructions. Confirmed mailing address is correct. Orders entered.

## 2019-12-13 ENCOUNTER — Telehealth (INDEPENDENT_AMBULATORY_CARE_PROVIDER_SITE_OTHER): Payer: 59 | Admitting: Psychiatry

## 2019-12-13 ENCOUNTER — Other Ambulatory Visit: Payer: Self-pay

## 2019-12-13 ENCOUNTER — Encounter (HOSPITAL_COMMUNITY): Payer: Self-pay | Admitting: Psychiatry

## 2019-12-13 DIAGNOSIS — F4310 Post-traumatic stress disorder, unspecified: Secondary | ICD-10-CM

## 2019-12-13 DIAGNOSIS — F330 Major depressive disorder, recurrent, mild: Secondary | ICD-10-CM | POA: Diagnosis not present

## 2019-12-13 DIAGNOSIS — F410 Panic disorder [episodic paroxysmal anxiety] without agoraphobia: Secondary | ICD-10-CM | POA: Diagnosis not present

## 2019-12-13 MED ORDER — PRAZOSIN HCL 5 MG PO CAPS: 5.0000 mg | ORAL_CAPSULE | Freq: Every day | ORAL | 2 refills | Status: DC

## 2019-12-13 MED ORDER — BUPROPION HCL ER (XL) 300 MG PO TB24: 300.0000 mg | ORAL_TABLET | Freq: Every day | ORAL | 2 refills | Status: DC
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
## 2019-12-23 ENCOUNTER — Telehealth: Payer: Self-pay | Admitting: *Deleted

## 2019-12-23 NOTE — Telephone Encounter (Signed)
Called [employer] and PA initiated. Pending review. Ref# [id number]

## 2019-12-23 NOTE — Telephone Encounter (Signed)
PA approved. [facility]uh# [id number] dates 01/17/2020-04/16/2020

## 2020-01-03 ENCOUNTER — Encounter: Payer: Self-pay | Admitting: Nurse Practitioner

## 2020-01-06 ENCOUNTER — Encounter (HOSPITAL_COMMUNITY)
Admission: RE | Admit: 2020-01-06 | Discharge: 2020-01-06 | Disposition: A | Discharge status: Home / Self Care | Payer: 59 | Source: Ambulatory Visit | Attending: Internal Medicine | Admitting: Internal Medicine

## 2020-01-06 ENCOUNTER — Other Ambulatory Visit: Payer: Self-pay

## 2020-01-14 ENCOUNTER — Other Ambulatory Visit: Payer: Self-pay

## 2020-01-14 ENCOUNTER — Other Ambulatory Visit (HOSPITAL_COMMUNITY)
Admission: RE | Admit: 2020-01-14 | Discharge: 2020-01-14 | Disposition: A | Discharge status: Home / Self Care | Payer: 59 | Source: Ambulatory Visit | Attending: Internal Medicine | Admitting: Internal Medicine

## 2020-01-14 ENCOUNTER — Encounter (HOSPITAL_COMMUNITY)
Admission: RE | Admit: 2020-01-14 | Discharge: 2020-01-14 | Disposition: A | Discharge status: Home / Self Care | Payer: 59 | Source: Ambulatory Visit | Attending: Internal Medicine | Admitting: Internal Medicine

## 2020-01-14 DIAGNOSIS — Z20822 Contact with and (suspected) exposure to covid-19: Secondary | ICD-10-CM | POA: Insufficient documentation

## 2020-01-14 DIAGNOSIS — Z01812 Encounter for preprocedural laboratory examination: Secondary | ICD-10-CM | POA: Insufficient documentation

## 2020-01-14 LAB — CBC WITH DIFFERENTIAL/PLATELET
Abs Immature Granulocytes: 0 10*3/uL (ref 0.00–0.07)
Basophils Absolute: 0 10*3/uL (ref 0.0–0.1)
Basophils Relative: 1 %
Eosinophils Absolute: 0 10*3/uL (ref 0.0–0.5)
Eosinophils Relative: 1 %
HCT: 26.8 % — ABNORMAL LOW (ref 36.0–46.0)
Hemoglobin: 7.6 g/dL — ABNORMAL LOW (ref 12.0–15.0)
Immature Granulocytes: 0 %
Lymphocytes Relative: 30 %
Lymphs Abs: 1 10*3/uL (ref 0.7–4.0)
MCH: 18.5 pg — ABNORMAL LOW (ref 26.0–34.0)
MCHC: 28.4 g/dL — ABNORMAL LOW (ref 30.0–36.0)
MCV: 65.4 fL — ABNORMAL LOW (ref 80.0–100.0)
Monocytes Absolute: 0.5 10*3/uL (ref 0.1–1.0)
Monocytes Relative: 14 %
Neutro Abs: 1.9 10*3/uL (ref 1.7–7.7)
Neutrophils Relative %: 54 %
Platelets: 282 10*3/uL (ref 150–400)
RBC: 4.1 MIL/uL (ref 3.87–5.11)
RDW: 18.3 % — ABNORMAL HIGH (ref 11.5–15.5)
WBC: 3.5 10*3/uL — ABNORMAL LOW (ref 4.0–10.5)
nRBC: 0 % (ref 0.0–0.2)

## 2020-01-14 LAB — SARS CORONAVIRUS 2 (TAT 6-24 HRS): SARS Coronavirus 2: NEGATIVE

## 2020-01-14 LAB — HCG, SERUM, QUALITATIVE: Preg, Serum: NEGATIVE

## 2020-01-17 ENCOUNTER — Encounter (HOSPITAL_COMMUNITY): Payer: Self-pay | Admitting: *Deleted

## 2020-01-17 ENCOUNTER — Ambulatory Visit (HOSPITAL_COMMUNITY)
Admission: RE | Admit: 2020-01-17 | Discharge: 2020-01-17 | Disposition: A | Discharge status: Home / Self Care | Payer: 59 | Attending: Internal Medicine | Admitting: Internal Medicine

## 2020-01-17 ENCOUNTER — Encounter (HOSPITAL_COMMUNITY)
Admission: RE | Disposition: A | Discharge status: Home / Self Care | Payer: Self-pay | Source: Home / Self Care | Attending: Internal Medicine

## 2020-01-17 ENCOUNTER — Ambulatory Visit (HOSPITAL_COMMUNITY): Payer: 59 | Admitting: Anesthesiology

## 2020-01-17 ENCOUNTER — Other Ambulatory Visit: Payer: Self-pay

## 2020-01-17 DIAGNOSIS — F419 Anxiety disorder, unspecified: Secondary | ICD-10-CM | POA: Diagnosis not present

## 2020-01-17 DIAGNOSIS — R110 Nausea: Secondary | ICD-10-CM | POA: Diagnosis not present

## 2020-01-17 DIAGNOSIS — K2970 Gastritis, unspecified, without bleeding: Secondary | ICD-10-CM

## 2020-01-17 DIAGNOSIS — K319 Disease of stomach and duodenum, unspecified: Secondary | ICD-10-CM | POA: Diagnosis not present

## 2020-01-17 DIAGNOSIS — F329 Major depressive disorder, single episode, unspecified: Secondary | ICD-10-CM | POA: Insufficient documentation

## 2020-01-17 DIAGNOSIS — J45909 Unspecified asthma, uncomplicated: Secondary | ICD-10-CM | POA: Diagnosis not present

## 2020-01-17 DIAGNOSIS — R1011 Right upper quadrant pain: Secondary | ICD-10-CM | POA: Insufficient documentation

## 2020-01-17 DIAGNOSIS — K581 Irritable bowel syndrome with constipation: Secondary | ICD-10-CM | POA: Insufficient documentation

## 2020-01-17 DIAGNOSIS — Z79899 Other long term (current) drug therapy: Secondary | ICD-10-CM | POA: Insufficient documentation

## 2020-01-17 DIAGNOSIS — Z791 Long term (current) use of non-steroidal anti-inflammatories (NSAID): Secondary | ICD-10-CM | POA: Diagnosis not present

## 2020-01-17 DIAGNOSIS — K219 Gastro-esophageal reflux disease without esophagitis: Secondary | ICD-10-CM | POA: Diagnosis not present

## 2020-01-17 DIAGNOSIS — K222 Esophageal obstruction: Secondary | ICD-10-CM | POA: Insufficient documentation

## 2020-01-17 DIAGNOSIS — Z87891 Personal history of nicotine dependence: Secondary | ICD-10-CM | POA: Insufficient documentation

## 2020-01-17 HISTORY — PX: ESOPHAGOGASTRODUODENOSCOPY (EGD) WITH PROPOFOL: SHX5813

## 2020-01-17 SURGERY — ESOPHAGOGASTRODUODENOSCOPY (EGD) WITH PROPOFOL
Anesthesia: General

## 2020-01-17 MED ORDER — CHLORHEXIDINE GLUCONATE CLOTH 2 % EX PADS: 6.0000 | MEDICATED_PAD | Freq: Once | CUTANEOUS | Status: DC

## 2020-01-17 MED ORDER — PROPOFOL 10 MG/ML IV BOLUS
INTRAVENOUS | Status: AC
  Filled 2020-01-17: qty 60

## 2020-01-17 MED ORDER — LIDOCAINE VISCOUS HCL 2 % MT SOLN
OROMUCOSAL | Status: AC
  Filled 2020-01-17: qty 15

## 2020-01-17 MED ORDER — GLYCOPYRROLATE 0.2 MG/ML IJ SOLN: 0.2000 mg | Freq: Once | INTRAMUSCULAR | Status: DC

## 2020-01-17 MED ORDER — LACTATED RINGERS IV SOLN: INTRAVENOUS | Status: DC | PRN

## 2020-01-17 MED ORDER — LACTATED RINGERS IV SOLN: Freq: Once | INTRAVENOUS | Status: AC

## 2020-01-17 MED ORDER — GLYCOPYRROLATE 0.2 MG/ML IJ SOLN
INTRAMUSCULAR | Status: AC
  Administered 2020-01-17: 0.2 mg
  Filled 2020-01-17: qty 1

## 2020-01-17 MED ORDER — LIDOCAINE VISCOUS HCL 2 % MT SOLN
15.0000 mL | Freq: Once | OROMUCOSAL | Status: AC
  Administered 2020-01-17: 15 mL via OROMUCOSAL

## 2020-01-17 MED ORDER — PROPOFOL 500 MG/50ML IV EMUL
INTRAVENOUS | Status: DC | PRN
  Administered 2020-01-17: 150 ug/kg/min via INTRAVENOUS

## 2020-01-17 MED ORDER — PROPOFOL 10 MG/ML IV BOLUS
INTRAVENOUS | Status: DC | PRN
  Administered 2020-01-17: 100 mg via INTRAVENOUS

## 2020-01-17 NOTE — Discharge Instructions (Addendum)
EGD Discharge instructions Please read the instructions outlined below and refer to this sheet in the next few weeks. These discharge instructions provide you with general information on caring for yourself after you leave the hospital. Your doctor may also give you specific instructions. While your treatment has been planned according to the most current medical practices available, unavoidable complications occasionally occur. If you have any problems or questions after discharge, please call your doctor.  Dr. Abbey Chatters:  [phone] ACTIVITY  You may resume your regular activity but move at a slower pace for the next 24 hours.   Take frequent rest periods for the next 24 hours.   Walking will help expel (get rid of) the air and reduce the bloated feeling in your abdomen.   No driving for 24 hours (because of the anesthesia (medicine) used during the test).   You may shower.   Do not sign any important legal documents or operate any machinery for 24 hours (because of the anesthesia used during the test).  NUTRITION  Drink plenty of fluids.   You may resume your normal diet.   Begin with a light meal and progress to your normal diet.   Avoid alcoholic beverages for 24 hours or as instructed by your caregiver.  MEDICATIONS  You may resume your normal medications unless your caregiver tells you otherwise.  WHAT YOU CAN EXPECT TODAY  You may experience abdominal discomfort such as a feeling of fullness or "gas" pains.  FOLLOW-UP  Your doctor will discuss the results of your test with you.  SEEK IMMEDIATE MEDICAL ATTENTION IF ANY OF THE FOLLOWING OCCUR:  Excessive nausea (feeling sick to your stomach) and/or vomiting.   Severe abdominal pain and distention (swelling).   Trouble swallowing.   Temperature over 101 F (37.8 C).   Rectal bleeding or vomiting of blood.   Continue on Dexilant. Await pathology results. We may need to consider colonoscopy given worsening anemia.  Need to follow iron studies. Follow up with [facility].

## 2020-01-17 NOTE — Op Note (Signed)
[facility] Patient Name: Charlotte Mccormick Procedure Date: 01/17/2020 8:13 AM MRN: [id number] Date of Birth: 09/06/1985 Attending MD: Elon Alas. Abbey Chatters DO CSN: [id number] Age: 34 Admit Type: Outpatient Procedure:                Upper GI endoscopy Indications:              Abdominal pain in the right upper quadrant, Nausea Providers:                Elon Alas. Abbey Chatters, DO, Janeece Riggers, RN, Lambert Mody, Aram Candela Referring MD:              Medicines:                See the Anesthesia note for documentation of the                            administered medications Complications:            No immediate complications. Estimated Blood Loss:     Estimated blood loss was minimal. Procedure:                Pre-Anesthesia Assessment:                           - The anesthesia plan was to use monitored                            anesthesia care (MAC).                           After obtaining informed consent, the endoscope was                            passed under direct vision. Throughout the                            procedure, the patient's blood pressure, pulse, and                            oxygen saturations were monitored continuously. The                            GIF-H190 ([id number]) was introduced through the                            mouth, and advanced to the second part of duodenum.                            The upper GI endoscopy was accomplished without                            difficulty. The patient tolerated the procedure  well. Scope In: 8:22:38 AM Scope Out: 8:27:50 AM Total Procedure Duration: 0 hours 5 minutes 12 seconds  Findings:      The Z-line was regular and was found 39 cm from the incisors.      There is no endoscopic evidence of bleeding, areas of erosion,       esophagitis, inflammation, ulcerations or varices in the entire       esophagus.      Localized moderate inflammation  characterized by erosions and erythema       was found in the gastric antrum. Biopsies were taken with a cold forceps       for Helicobacter pylori testing.      The duodenal bulb, first portion of the duodenum and second portion of       the duodenum were normal. Biopsies for histology were taken with a cold       forceps for evaluation of celiac disease.      A mild Schatzki ring was found in the lower third of the esophagus.       Obliterated with biopsy forceps. Impression:               - Z-line regular, 39 cm from the incisors.                           - Gastritis. Biopsied.                           - Normal duodenal bulb, first portion of the                            duodenum and second portion of the duodenum.                            Biopsied.                           - Mild Schatzki ring. Moderate Sedation:      Per Anesthesia Care Recommendation:           - Patient has a contact number available for                            emergencies. The signs and symptoms of potential                            delayed complications were discussed with the                            patient. Return to normal activities tomorrow.                            Written discharge instructions were provided to the                            patient.                           - Resume previous diet.                           -  Continue present medications.                           - Await pathology results.                           - Consider colonoscopy due to worsening anemia.                           - Continue on Dexilant                           - Follow up with [facility] in GI clinic Procedure Code(s):        --- Professional ---                           [phone], Esophagogastroduodenoscopy, flexible,                            transoral; with biopsy, single or multiple Diagnosis Code(s):        --- Professional ---                           K29.70, Gastritis, unspecified,  without bleeding                           R10.11, Right upper quadrant pain                           R11.0, Nausea CPT copyright 2019 American Medical Association. All rights reserved. The codes documented in this report are preliminary and upon coder review may  be revised to meet current compliance requirements. Elon Alas. Abbey Chatters, [place] Abbey Chatters, DO 01/17/2020 9:04:06 AM This report has been signed electronically. Number of Addenda: 0

## 2020-01-17 NOTE — Transfer of Care (Addendum)
Immediate Anesthesia Transfer of Care Note  Patient: Charlotte Mccormick  Procedure(s) Performed: ESOPHAGOGASTRODUODENOSCOPY (EGD) WITH PROPOFOL (N/A )  Patient Location: PACU  Anesthesia Type:General  Level of Consciousness: awake, alert , oriented and patient cooperative  Airway & Oxygen Therapy: Patient Spontanous Breathing  Post-op Assessment: Report given to RN, Post -op Vital signs reviewed and stable and Patient moving all extremities X 4  Post vital signs: Reviewed and stable  Last Vitals:  Vitals Value Taken Time  BP    Temp    Pulse    Resp    SpO2      Last Pain:  Vitals:   01/17/20 0731  TempSrc: Oral  PainSc: 0-No pain      Patients Stated Pain Goal: 8 (03/29/24 3664)  Complications: No complications documented.

## 2020-01-17 NOTE — H&P (Signed)
Primary Care Physician:  Denyce Robert, FNP Primary Gastroenterologist:  Dr.   Pre-Procedure History & Physical: HPI:  Charlotte Mccormick is a 34 y.o. female is here for EGD due to history of reflux, epigastric pain/burning  Past Medical History:  Diagnosis Date  . Abnormal uterine bleeding (AUB) 12/12/2015  . Anemia   . Anxiety   . Asthma   . Constipation   . Depression   . Fibroid   . Genital herpes   . GERD (gastroesophageal reflux disease)   . History of chlamydia   . Hyperemesis   . IBS (irritable bowel syndrome)   . Insomnia   . Mental disorder    PTSD, ANXIETY,Depression  . PTSD (post-traumatic stress disorder)   . Sinusitis   . Vaginal dryness 12/12/2015    Past Surgical History:  Procedure Laterality Date  . COLONOSCOPY     2016  . COLONOSCOPY N/A 04/08/2016   Procedure: COLONOSCOPY;  Surgeon: Danie Binder, MD;  Location: [facility];  Service: Endoscopy;  Laterality: N/A;  10:15 AM  . ESOPHAGOGASTRODUODENOSCOPY N/A 04/08/2016   Procedure: ESOPHAGOGASTRODUODENOSCOPY (EGD);  Surgeon: Danie Binder, MD;  Location: [facility];  Service: Endoscopy;  Laterality: N/A;  . PERINEUM REPAIR    . UPPER GASTROINTESTINAL ENDOSCOPY  2016    Prior to Admission medications   Medication Sig Start Date End Date Taking? Authorizing Provider  acetaminophen (TYLENOL) 500 MG tablet Take 500 mg by mouth every 6 (six) hours as needed for mild pain or moderate pain.   Yes [provider]  albuterol (VENTOLIN HFA) 108 (90 Base) MCG/ACT inhaler Inhale 1 puff into the lungs every 6 (six) hours as needed for wheezing or shortness of breath.   Yes [provider]  buPROPion (WELLBUTRIN XL) 300 MG 24 hr tablet Take 1 tablet (300 mg total) by mouth daily. 12/17/19 03/16/20 Yes Pucilowski, Olgierd A, MD  dexlansoprazole (DEXILANT) 60 MG capsule Take 1 capsule (60 mg total) by mouth daily. 11/08/19  Yes Annitta Needs, NP  diazepam (VALIUM) 5 MG tablet Take 5 mg by mouth  every 6 (six) hours as needed for anxiety.   Yes [provider]  linaclotide Rolan Lipa) 72 MCG capsule Take 1 capsule (72 mcg total) by mouth daily before breakfast. 11/02/19  Yes Carlis Stable, NP  meloxicam (MOBIC) 15 MG tablet Take 15 mg by mouth daily.  01/21/19  Yes [provider]  ondansetron (ZOFRAN ODT) 4 MG disintegrating tablet Take 1 tablet (4 mg total) by mouth every 8 (eight) hours as needed. Patient taking differently: Take 4 mg by mouth every 8 (eight) hours as needed for nausea or vomiting.  10/13/19  Yes Long, Wonda Olds, MD  prazosin (MINIPRESS) 5 MG capsule Take 1 capsule (5 mg total) by mouth at bedtime. 12/17/19 03/16/20 Yes Pucilowski, Olgierd A, MD  rizatriptan (MAXALT) 10 MG tablet Take 1 tablet by mouth daily as needed. At onset of headache 08/17/19  Yes [provider]  VITAMIN D PO Take 1 tablet by mouth daily.   Yes [provider]  dicyclomine (BENTYL) 20 MG tablet Take 1 tablet (20 mg total) by mouth 3 (three) times daily as needed for spasms (abdominal cramping). 10/13/19   Long, Wonda Olds, MD  famotidine (PEPCID) 20 MG tablet Take 1 tablet (20 mg total) by mouth daily. 10/13/19 11/12/19  Long, Wonda Olds, MD  omeprazole (PRILOSEC) 20 MG capsule Take 1 capsule (20 mg total) by mouth daily. 10/05/19 11/04/19  Faustino Congress,  NP  LO LOESTRIN FE 1 MG-10 MCG / 10 MCG tablet Take 1 tablet by mouth daily. Patient not taking: Reported on 10/13/2019 02/15/19 10/29/19  [name], CNM  zolpidem (AMBIEN CR) 12.5 MG CR tablet Take 1 tablet (12.5 mg total) by mouth at bedtime as needed for sleep. Patient not taking: Reported on 10/13/2019 09/20/19 10/29/19  Pucilowski, Marchia Bond, MD    Allergies as of 12/09/2019 - Review Complete 11/02/2019  Allergen Reaction Noted  . Bee venom Swelling and Other (See Comments) 04/12/2013  . Penicillins Itching and Other (See Comments) 04/02/2016  . Reglan [metoclopramide] Anxiety 07/22/2016    Family History   Problem Relation Age of Onset  . Heart disease Mother   . Hypertension Mother   . Hypertension Father   . Hyperlipidemia Father   . Diabetes Father   . Cancer Father   . Epilepsy Brother   . Eczema Daughter   . Autism spectrum disorder Son   . Colon cancer Neg Hx     Social History   Socioeconomic History  . Marital status: Married    Spouse name: Not on file  . Number of children: 3  . Years of education: Not on file  . Highest education level: Not on file  Occupational History  . Not on file  Tobacco Use  . Smoking status: Former Smoker    Packs/day: 0.50    Years: 5.00    Pack years: 2.50    Types: Cigarettes    Quit date: 06/03/2010    Years since quitting: 9.6  . Smokeless tobacco: Never Used  Vaping Use  . Vaping Use: Never used  Substance and Sexual Activity  . Alcohol use: Not Currently    Alcohol/week: 2.0 standard drinks    Types: 2 Glasses of wine per week    Comment: occ wine; denied 11/02/19  . Drug use: Yes    Types: Marijuana    Comment: 11/02/19 states she used gummies 1-2 times a month.  . Sexual activity: Yes    Birth control/protection: Pill  Other Topics Concern  . Not on file  Social History Narrative   ** Merged History Encounter **       Social Determinants of Health   Financial Resource Strain:   . Difficulty of Paying Living Expenses:   Food Insecurity:   . Worried About Charity fundraiser in the Last Year:   . Arboriculturist in the Last Year:   Transportation Needs:   . Film/video editor (Medical):   Marland Kitchen Lack of Transportation (Non-Medical):   Physical Activity:   . Days of Exercise per Week:   . Minutes of Exercise per Session:   Stress:   . Feeling of Stress :   Social Connections:   . Frequency of Communication with Friends and Family:   . Frequency of Social Gatherings with Friends and Family:   . Attends Religious Services:   . Active Member of Clubs or Organizations:   . Attends Archivist Meetings:   Marland Kitchen  Marital Status:   Intimate Partner Violence:   . Fear of Current or Ex-Partner:   . Emotionally Abused:   Marland Kitchen Physically Abused:   . Sexually Abused:     Review of Systems: See HPI, otherwise negative ROS  Impression/Plan: Charlotte Mccormick is here for a EGD to be performed for reflux and epigastric pain/burning  Risks, benefits, limitations, imponderables and alternatives regarding colonoscopy have been reviewed with the patient. Questions have  been answered. All parties agreeable.

## 2020-01-17 NOTE — Anesthesia Preprocedure Evaluation (Addendum)
Anesthesia Evaluation  Patient identified by MRN, date of birth, ID band Patient awake    Reviewed: Allergy & Precautions, NPO status , Patient's Chart, lab work & pertinent test results  History of Anesthesia Complications Negative for: history of anesthetic complications  Airway Mallampati: II  TM Distance: >3 FB Neck ROM: Full    Dental  (+) Dental Advisory Given, Teeth Intact   Pulmonary asthma , former smoker,    Pulmonary exam normal breath sounds clear to auscultation       Cardiovascular Exercise Tolerance: Good Normal cardiovascular exam Rhythm:Regular Rate:Normal     Neuro/Psych PSYCHIATRIC DISORDERS Anxiety Depression    GI/Hepatic Neg liver ROS, GERD  Medicated and Controlled,  Endo/Other  negative endocrine ROS  Renal/GU negative Renal ROS     Musculoskeletal negative musculoskeletal ROS (+)   Abdominal   Peds  Hematology  (+) anemia ,   Anesthesia Other Findings   Reproductive/Obstetrics negative OB ROS                             Anesthesia Physical Anesthesia Plan  ASA: II  Anesthesia Plan: General   Post-op Pain Management:    Induction: Intravenous  PONV Risk Score and Plan: 2 and TIVA  Airway Management Planned: Nasal Cannula, Natural Airway and Simple Face Mask  Additional Equipment:   Intra-op Plan:   Post-operative Plan:   Informed Consent: I have reviewed the patients History and Physical, chart, labs and discussed the procedure including the risks, benefits and alternatives for the proposed anesthesia with the patient or authorized representative who has indicated his/her understanding and acceptance.     Dental advisory given  Plan Discussed with: CRNA, Surgeon and Anesthesiologist  Anesthesia Plan Comments:         Anesthesia Quick Evaluation

## 2020-01-17 NOTE — Anesthesia Postprocedure Evaluation (Signed)
Anesthesia Post Note  Patient: Research officer, political party  Procedure(s) Performed: ESOPHAGOGASTRODUODENOSCOPY (EGD) WITH PROPOFOL (N/A )  Patient location during evaluation: Phase II Anesthesia Type: General Level of consciousness: awake, awake and alert, oriented and patient cooperative Pain management: satisfactory to patient Vital Signs Assessment: post-procedure vital signs reviewed and stable Respiratory status: spontaneous breathing, nonlabored ventilation and respiratory function stable Cardiovascular status: stable Postop Assessment: no apparent nausea or vomiting Anesthetic complications: no   No complications documented.   Last Vitals:  Vitals:   01/17/20 0731  BP: 112/70  Pulse: 83  Resp: 18  Temp: 36.9 C  SpO2: 100%    Last Pain:  Vitals:   01/17/20 0731  TempSrc: Oral  PainSc: 0-No pain                 Starla Deller

## 2020-01-18 ENCOUNTER — Other Ambulatory Visit: Payer: Self-pay

## 2020-01-18 LAB — SURGICAL PATHOLOGY

## 2020-01-20 ENCOUNTER — Other Ambulatory Visit: Payer: Self-pay

## 2020-01-20 ENCOUNTER — Emergency Department (HOSPITAL_COMMUNITY)
Admission: EM | Admit: 2020-01-20 | Discharge: 2020-01-20 | Disposition: A | Discharge status: Home / Self Care | Payer: 59

## 2020-01-20 ENCOUNTER — Encounter (HOSPITAL_COMMUNITY): Payer: Self-pay | Admitting: Internal Medicine

## 2020-01-20 ENCOUNTER — Ambulatory Visit
Admission: EM | Admit: 2020-01-20 | Discharge: 2020-01-20 | Disposition: A | Discharge status: Home / Self Care | Payer: 59

## 2020-01-20 NOTE — ED Triage Notes (Signed)
Pt reports she is extremely weak, nauseated ,has a headache and dizzy. Pt states her last hgb was 7.6 this past Friday. Reports she is having black stools but she is also taking iron.

## 2020-01-20 NOTE — ED Notes (Signed)
Patient is being discharged from the Urgent Care and sent to the Emergency Department via ambulance . Per K. Avegno, patient is in need of higher level of care due to low hgb . Patient is aware and verbalizes understanding of plan of care.  Vitals:   01/20/20 1500  BP: 114/77  Pulse: 85  Resp: 18  Temp: 98.6 F (37 C)  SpO2: 98%

## 2020-01-21 ENCOUNTER — Telehealth: Payer: Self-pay | Admitting: Internal Medicine

## 2020-01-21 NOTE — Telephone Encounter (Signed)
Pt called this morning to say she had a procedure on Monday by Dr Abbey Chatters and wants [place] to write an order for her to go to Short Stay to check her levels. Please advise and call her at [phone]

## 2020-01-21 NOTE — Telephone Encounter (Signed)
Routing to Angie 

## 2020-01-21 NOTE — Telephone Encounter (Signed)
Called pt back.  She said that she went to hospital ER in [place] this morning.  She is there now.  She was feeling weak, having headache, and feeling dizzy. She said that she saw her PCP [facility]) on Tuesday and yesterday and they informed her that she may end up needing a blood transfusion.    Routing to Dr. Abbey Chatters as Juluis Rainier.

## 2020-01-24 ENCOUNTER — Other Ambulatory Visit: Payer: Self-pay

## 2020-01-24 ENCOUNTER — Encounter: Payer: Self-pay | Admitting: "Endocrinology

## 2020-01-24 ENCOUNTER — Ambulatory Visit (INDEPENDENT_AMBULATORY_CARE_PROVIDER_SITE_OTHER): Payer: 59 | Admitting: "Endocrinology

## 2020-01-24 VITALS — BP 108/70 | HR 72 | Ht 62.0 in | Wt 169.6 lb

## 2020-01-24 DIAGNOSIS — E0590 Thyrotoxicosis, unspecified without thyrotoxic crisis or storm: Secondary | ICD-10-CM | POA: Diagnosis not present

## 2020-01-24 NOTE — Telephone Encounter (Signed)
Thank you for letting me know.  Charlotte Mccormick

## 2020-01-24 NOTE — Progress Notes (Signed)
01/24/2020     Endocrinology Consult Note    Subjective:    Patient ID: Charlotte Mccormick, female    DOB: Jan 24, 1986, PCP Denyce Robert, FNP.   Past Medical History:  Diagnosis Date  . Abnormal uterine bleeding (AUB) 12/12/2015  . Anemia   . Anxiety   . Asthma   . Constipation   . Depression   . Fibroid   . Genital herpes   . GERD (gastroesophageal reflux disease)   . History of chlamydia   . Hyperemesis   . IBS (irritable bowel syndrome)   . Insomnia   . Mental disorder    PTSD, ANXIETY,Depression  . PTSD (post-traumatic stress disorder)   . Sinusitis   . Vaginal dryness 12/12/2015    Past Surgical History:  Procedure Laterality Date  . COLONOSCOPY     2016  . COLONOSCOPY N/A 04/08/2016   Procedure: COLONOSCOPY;  Surgeon: Danie Binder, MD;  Location: [facility];  Service: Endoscopy;  Laterality: N/A;  10:15 AM  . ESOPHAGOGASTRODUODENOSCOPY N/A 04/08/2016   Procedure: ESOPHAGOGASTRODUODENOSCOPY (EGD);  Surgeon: Danie Binder, MD;  Location: [facility];  Service: Endoscopy;  Laterality: N/A;  . ESOPHAGOGASTRODUODENOSCOPY (EGD) WITH PROPOFOL N/A 01/17/2020   Procedure: ESOPHAGOGASTRODUODENOSCOPY (EGD) WITH PROPOFOL;  Surgeon: [name], DO;  Location: [facility];  Service: Endoscopy;  Laterality: N/A;  8:15am  . PERINEUM REPAIR    . UPPER GASTROINTESTINAL ENDOSCOPY  2016    Social History   Socioeconomic History  . Marital status: Married    Spouse name: Not on file  . Number of children: 3  . Years of education: Not on file  . Highest education level: Not on file  Occupational History  . Not on file  Tobacco Use  . Smoking status: Former Smoker    Packs/day: 0.50    Years: 5.00    Pack years: 2.50    Types: Cigarettes    Quit date: 06/03/2010    Years since quitting: 9.6  . Smokeless tobacco: Never Used  Vaping Use  . Vaping Use: Never used  Substance and Sexual Activity  . Alcohol use: Not Currently    Alcohol/week:  2.0 standard drinks    Types: 2 Glasses of wine per week    Comment: occ wine; denied 11/02/19  . Drug use: Yes    Types: Marijuana    Comment: 11/02/19 states she used gummies 1-2 times a month.  . Sexual activity: Yes    Birth control/protection: Pill  Other Topics Concern  . Not on file  Social History Narrative   ** Merged History Encounter **       Social Determinants of Health   Financial Resource Strain:   . Difficulty of Paying Living Expenses: Not on file  Food Insecurity:   . Worried About Charity fundraiser in the Last Year: Not on file  . Ran Out of Food in the Last Year: Not on file  Transportation Needs:   . Lack of Transportation (Medical): Not on file  . Lack of Transportation (Non-Medical): Not on file  Physical Activity:   . Days of Exercise per Week: Not on file  . Minutes of Exercise per Session: Not on file  Stress:   . Feeling of Stress : Not on file  Social Connections:   . Frequency of Communication with Friends and Family: Not on file  . Frequency of Social Gatherings with Friends and Family: Not on file  . Attends Religious  Services: Not on file  . Active Member of Clubs or Organizations: Not on file  . Attends Archivist Meetings: Not on file  . Marital Status: Not on file    Family History  Problem Relation Age of Onset  . Heart disease Mother   . Hypertension Mother   . Hyperlipidemia Mother   . Heart attack Mother   . Heart failure Mother   . Hypertension Father   . Hyperlipidemia Father   . Diabetes Father   . Cancer Father   . Thyroid disease Father   . Stroke Father   . Epilepsy Brother   . Eczema Daughter   . Autism spectrum disorder Son   . Colon cancer Neg Hx     Outpatient Encounter Medications as of 01/24/2020  Medication Sig  . FERROUS SULFATE PO Take 65 mg by mouth daily.  Marland Kitchen acetaminophen (TYLENOL) 500 MG tablet Take 500 mg by mouth every 6 (six) hours as needed for mild pain or moderate pain.  Marland Kitchen albuterol  (VENTOLIN HFA) 108 (90 Base) MCG/ACT inhaler Inhale 1 puff into the lungs every 6 (six) hours as needed for wheezing or shortness of breath.  Marland Kitchen buPROPion (WELLBUTRIN XL) 300 MG 24 hr tablet Take 1 tablet (300 mg total) by mouth daily.  Marland Kitchen dexlansoprazole (DEXILANT) 60 MG capsule Take 1 capsule (60 mg total) by mouth daily.  . diazepam (VALIUM) 5 MG tablet Take 5 mg by mouth every 6 (six) hours as needed for anxiety.  . dicyclomine (BENTYL) 20 MG tablet Take 1 tablet (20 mg total) by mouth 3 (three) times daily as needed for spasms (abdominal cramping).  Marland Kitchen linaclotide (LINZESS) 72 MCG capsule Take 1 capsule (72 mcg total) by mouth daily before breakfast.  . meloxicam (MOBIC) 15 MG tablet Take 15 mg by mouth daily. As needed  . ondansetron (ZOFRAN ODT) 4 MG disintegrating tablet Take 1 tablet (4 mg total) by mouth every 8 (eight) hours as needed. (Patient taking differently: Take 4 mg by mouth every 8 (eight) hours as needed for nausea or vomiting. )  . prazosin (MINIPRESS) 5 MG capsule Take 1 capsule (5 mg total) by mouth at bedtime.  . rizatriptan (MAXALT) 10 MG tablet Take 1 tablet by mouth daily as needed. At onset of headache  . VITAMIN D PO Take 1 tablet by mouth daily.  . [DISCONTINUED] famotidine (PEPCID) 20 MG tablet Take 1 tablet (20 mg total) by mouth daily.  . [DISCONTINUED] LO LOESTRIN FE 1 MG-10 MCG / 10 MCG tablet Take 1 tablet by mouth daily. (Patient not taking: Reported on 10/13/2019)  . [DISCONTINUED] omeprazole (PRILOSEC) 20 MG capsule Take 1 capsule (20 mg total) by mouth daily.  . [DISCONTINUED] zolpidem (AMBIEN CR) 12.5 MG CR tablet Take 1 tablet (12.5 mg total) by mouth at bedtime as needed for sleep. (Patient not taking: Reported on 10/13/2019)   No facility-administered encounter medications on file as of 01/24/2020.    ALLERGIES: Allergies  Allergen Reactions  . Bee Venom Swelling and Other (See Comments)    Reaction:  Localized swelling   . Hydrocodone Itching  .  Penicillins Itching and Other (See Comments)    Has patient had a PCN reaction causing immediate rash, facial/tongue/throat swelling, SOB or lightheadedness with hypotension: No Has patient had a PCN reaction causing severe rash involving mucus membranes or skin necrosis: No Has patient had a PCN reaction that required hospitalization No Has patient had a PCN reaction occurring within the last 10 years:  Yes If all of the above answers are "NO", then may proceed with Cephalosporin use.   . Reglan [Metoclopramide] Anxiety    VACCINATION STATUS: Immunization History  Administered Date(s) Administered  . Tdap 01/29/2018     HPI  Yulieth Munford is 34 y.o. female who presents today with a medical history as above. she is being seen in consultation for hyperthyroidism requested by Denyce Robert, [place].  she has been dealing with symptoms of mood disorders, intermittent hypothermia, anxiety, and fluctuating body weight.  These symptoms are progressively worsening and troubling to her.  She underwent thyroid function tests on at least 3 occasions where she was observed to have suppressed TSH between 2020-2021. her most recent thyroid labs revealed suppressed TSH of 0.3720   on Oct 15, 2019. she denies dysphagia, choking, shortness of breath, no recent voice change.    she has family history of thyroid dysfunction in her father, but denies family hx of thyroid cancer. she denies personal history of goiter. she is not on any anti-thyroid medications nor on any thyroid hormone supplements. she  is willing to proceed with appropriate work up and therapy for thyrotoxicosis.                           Review of systems  Constitutional: + Fluctuating body weight, + fatigue, + subjective hyperthermia Eyes: no blurry vision, - xerophthalmia ENT: no sore throat, no nodules palpated in throat, no dysphagia/odynophagia, nor hoarseness Cardiovascular: no Chest Pain, no Shortness of Breath, -   palpitations, no leg swelling Respiratory: no cough, no SOB Gastrointestinal: no Nausea, no Vomiting, no Diarhhea Musculoskeletal: no muscle/joint aches Skin: no rashes Neurological: -  tremors, no numbness, no tingling, no dizziness Psychiatric: no depression, ++  anxiety   Objective:    BP 108/70   Pulse 72   Ht 5\' 2"  (1.575 m)   Wt 169 lb 9.6 oz (76.9 kg)   LMP 12/26/2019   BMI 31.02 kg/m   Wt Readings from Last 3 Encounters:  01/24/20 169 lb 9.6 oz (76.9 kg)  01/17/20 190 lb (86.2 kg)  10/13/19 190 lb (86.2 kg)                                                Physical exam  Constitutional: Body mass index is 31.02 kg/m., not in acute distress, + stable state of mind Eyes: PERRLA, EOMI, - exophthalmos ENT: moist mucous membranes, + palpable thyromegaly, no cervical lymphadenopathy Cardiovascular: + normal  precordial activity, -tachycardic,  no Murmur/Rubs/Gallops Respiratory:  adequate breathing efforts, no gross chest deformity, Clear to auscultation bilaterally Gastrointestinal: abdomen soft, Non -tender, No distension, Bowel Sounds present Musculoskeletal: no gross deformities, strength intact in all four extremities Skin: moist, warm, no rashes Neurological: -  tremor with outstretched hands,  + Deep Tendon Reflexes  on both lower extremities.   CMP     Component Value Date/Time   NA 135 10/13/2019 1212   NA 136 11/13/2017 1127   K 4.2 10/13/2019 1212   CL 105 10/13/2019 1212   CO2 23 10/13/2019 1212   GLUCOSE 90 10/13/2019 1212   BUN 7 10/15/2019 0000   CREATININE 0.7 10/15/2019 0000   CREATININE 0.69 10/13/2019 1212   CALCIUM 8.9 10/13/2019 1212   PROT 7.7 10/13/2019 1212   PROT 6.8 11/13/2017 1127  ALBUMIN 3.9 10/13/2019 1212   ALBUMIN 3.8 11/13/2017 1127   AST 17 10/13/2019 1212   ALT 24 10/13/2019 1212   ALKPHOS 144 (H) 10/13/2019 1212   BILITOT 0.4 10/13/2019 1212   BILITOT 0.3 11/13/2017 1127   GFRNONAA 107 10/15/2019 0000   GFRAA 123  10/15/2019 0000     CBC    Component Value Date/Time   WBC 3.5 (L) 01/14/2020 1041   RBC 4.10 01/14/2020 1041   HGB 7.6 (L) 01/14/2020 1041   HGB 10.1 (L) 12/29/2017 0933   HCT 26.8 (L) 01/14/2020 1041   HCT 32.7 (L) 12/29/2017 0933   PLT 282 01/14/2020 1041   PLT 176 12/29/2017 0933   MCV 65.4 (L) 01/14/2020 1041   MCV 71 (L) 12/29/2017 0933   MCH 18.5 (L) 01/14/2020 1041   MCHC 28.4 (L) 01/14/2020 1041   RDW 18.3 (H) 01/14/2020 1041   RDW 16.6 (H) 12/29/2017 0933   LYMPHSABS 1.0 01/14/2020 1041   LYMPHSABS 1.2 03/01/2016 1446   MONOABS 0.5 01/14/2020 1041   EOSABS 0.0 01/14/2020 1041   EOSABS 0.0 03/01/2016 1446   BASOSABS 0.0 01/14/2020 1041   BASOSABS 0.0 03/01/2016 1446     Diabetic Labs (most recent): Lab Results  Component Value Date   HGBA1C 5.2 10/15/2019   HGBA1C 5.0 01/23/2019   HGBA1C 4.8 10/07/2016    Lipid Panel     Component Value Date/Time   CHOL 146 10/15/2019 0000   TRIG 56 10/15/2019 0000   HDL 42 10/15/2019 0000   CHOLHDL 3.1 01/23/2019 0623   VLDL 11 01/23/2019 0623   LDLCALC 92 10/15/2019 0000     Lab Results  Component Value Date   TSH 0.37 (A) 10/15/2019   TSH 0.126 (L) 01/23/2019   TSH 0.232 (L) 02/18/2018   FREET4 0.75 01/24/2019   FREET4 0.90 02/18/2018        Assessment & Plan:   1. Subclinical hyperthyroidism  she is being seen at a kind request of Denyce Robert, [place]. her history and most recent labs are reviewed, and she was examined clinically. Subjective and objective findings are consistent with subclinical hyperthyroidism . The potential risks of untreated thyrotoxicosis and the need for definitive therapy have been discussed in detail with her, and she agrees to proceed with diagnostic workup and treatment plan.   I like to repeat full profile thyroid function tests today and confirmatory thyroid uptake and scan will be scheduled to be done as soon as possible.   Options of therapy are discussed with her.   We discussed the option of treating it with medications including methimazole or PTU which may have side effects including rash, transaminitis, and bone marrow suppression.  We  also discussed the option of definitive therapy with RAI ablation of the thyroid.   If  she is found to have primary hyperthyroidism from Graves' disease , toxic multinodular goiter or toxic nodular goiter the preferred modality of treatment would be I-131 thyroid ablation.    -Patient is made aware of the high likelihood of post ablative hypothyroidism with subsequent need for lifelong thyroid hormone replacement. sheunderstands this outcome  and she is  willing to proceed.    -In light of her palpable thyroid, she will be considered for thyroid ultrasound if her uptake is negative. she will return in 10 days for treatment decision.  -Her pulse rate is 72.  I did not initiate any new prescription for her today.  -Patient is advised to maintain close follow up  with Denyce Robert, FNP for primary care needs.   - Time spent with the patient: 60 minutes, of which >50% was spent in obtaining information about her symptoms, reviewing her previous labs, evaluations, and treatments, counseling her about her subclinical hyperthyroidism, and developing a plan to confirm the diagnosis and long term treatment as necessary. Please refer to " Patient Self Inventory" in the Media  tab for reviewed elements of pertinent patient history.  Tamiyah Shields-Lanier participated in the discussions, expressed understanding, and voiced agreement with the above plans.  All questions were answered to her satisfaction. she is encouraged to contact clinic should she have any questions or concerns prior to her return visit.   Follow up plan: Return in about 2 weeks (around 02/07/2020) for Labs Today- Non-Fasting Ok, F/U with Thyroid Uptake and Scan.   Thank you for involving me in the care of this pleasant patient, and I will continue to update you  with her progress.  Glade Lloyd, MD [facility] Phone: [phone]  Fax: [phone]   01/24/2020, 4:17 PM  This note was partially dictated with voice recognition software. Similar sounding words can be transcribed inadequately or may not  be corrected upon review.

## 2020-01-26 ENCOUNTER — Other Ambulatory Visit: Payer: Self-pay | Admitting: *Deleted

## 2020-01-26 DIAGNOSIS — D649 Anemia, unspecified: Secondary | ICD-10-CM

## 2020-01-26 LAB — TSH: TSH: 0.664 u[IU]/mL (ref 0.450–4.500)

## 2020-01-26 LAB — T3, FREE: T3, Free: 3.8 pg/mL (ref 2.0–4.4)

## 2020-01-26 LAB — THYROID PEROXIDASE ANTIBODY: Thyroperoxidase Ab SerPl-aCnc: 13 IU/mL (ref 0–34)

## 2020-01-26 LAB — THYROGLOBULIN ANTIBODY: Thyroglobulin Antibody: <1 IU/mL (ref 0.0–0.9)

## 2020-01-26 LAB — T4, FREE: Free T4: 1.21 ng/dL (ref 0.82–1.77)

## 2020-01-28 LAB — CBC WITH DIFFERENTIAL/PLATELET
Basophils Absolute: 0 10*3/uL (ref 0.0–0.2)
Basos: 1 %
EOS (ABSOLUTE): 0.1 10*3/uL (ref 0.0–0.4)
Eos: 1 %
Hematocrit: 30.8 % — ABNORMAL LOW (ref 34.0–46.6)
Hemoglobin: 8.6 g/dL — ABNORMAL LOW (ref 11.1–15.9)
Immature Grans (Abs): 0 10*3/uL (ref 0.0–0.1)
Immature Granulocytes: 0 %
Lymphocytes Absolute: 1.4 10*3/uL (ref 0.7–3.1)
Lymphs: 31 %
MCH: 19.2 pg — ABNORMAL LOW (ref 26.6–33.0)
MCHC: 27.9 g/dL — ABNORMAL LOW (ref 31.5–35.7)
MCV: 69 fL — ABNORMAL LOW (ref 79–97)
Monocytes Absolute: 0.6 10*3/uL (ref 0.1–0.9)
Monocytes: 12 %
Neutrophils Absolute: 2.5 10*3/uL (ref 1.4–7.0)
Neutrophils: 55 %
Platelets: 186 10*3/uL (ref 150–450)
RBC: 4.47 x10E6/uL (ref 3.77–5.28)
RDW: 23.7 % — ABNORMAL HIGH (ref 11.7–15.4)
WBC: 4.6 10*3/uL (ref 3.4–10.8)

## 2020-01-28 LAB — IRON,TIBC AND FERRITIN PANEL
Ferritin: 18 ng/mL (ref 15–150)
Iron Saturation: 47 % (ref 15–55)
Iron: 179 ug/dL — ABNORMAL HIGH (ref 27–159)
Total Iron Binding Capacity: 383 ug/dL (ref 250–450)
UIBC: 204 ug/dL (ref 131–425)

## 2020-01-31 ENCOUNTER — Telehealth: Payer: Self-pay | Admitting: "Endocrinology

## 2020-01-31 NOTE — Telephone Encounter (Signed)
Left a message with Nira Conn of pt's authorization # [id number] for her thyroid uptake and scan.

## 2020-01-31 NOTE — Telephone Encounter (Signed)
Charlotte Mccormick is calling from preservice center and states the primary insurance is needing a authorization for her uptake and scan. She can be reached at [phone]

## 2020-02-02 ENCOUNTER — Encounter (HOSPITAL_COMMUNITY)
Admission: RE | Admit: 2020-02-02 | Discharge: 2020-02-02 | Disposition: A | Discharge status: Home / Self Care | Payer: 59 | Source: Ambulatory Visit | Attending: "Endocrinology | Admitting: "Endocrinology

## 2020-02-02 ENCOUNTER — Other Ambulatory Visit: Payer: Self-pay

## 2020-02-02 DIAGNOSIS — E0590 Thyrotoxicosis, unspecified without thyrotoxic crisis or storm: Secondary | ICD-10-CM | POA: Insufficient documentation

## 2020-02-02 MED ORDER — SODIUM IODIDE I 131 CAPSULE
438.0000 | Freq: Once | INTRAVENOUS | Status: AC | PRN
  Administered 2020-02-02: 438 via ORAL

## 2020-02-03 ENCOUNTER — Encounter (HOSPITAL_COMMUNITY)
Admission: RE | Admit: 2020-02-03 | Discharge: 2020-02-03 | Disposition: A | Discharge status: Home / Self Care | Payer: 59 | Source: Ambulatory Visit | Attending: "Endocrinology | Admitting: "Endocrinology

## 2020-02-10 ENCOUNTER — Encounter: Payer: Self-pay | Admitting: "Endocrinology

## 2020-02-10 ENCOUNTER — Ambulatory Visit: Payer: 59 | Admitting: "Endocrinology

## 2020-02-10 ENCOUNTER — Ambulatory Visit (INDEPENDENT_AMBULATORY_CARE_PROVIDER_SITE_OTHER): Payer: 59 | Admitting: "Endocrinology

## 2020-02-10 ENCOUNTER — Other Ambulatory Visit: Payer: Self-pay

## 2020-02-10 VITALS — BP 106/78 | HR 84 | Ht 62.0 in | Wt 165.6 lb

## 2020-02-10 DIAGNOSIS — E042 Nontoxic multinodular goiter: Secondary | ICD-10-CM | POA: Diagnosis not present

## 2020-02-10 DIAGNOSIS — E0590 Thyrotoxicosis, unspecified without thyrotoxic crisis or storm: Secondary | ICD-10-CM | POA: Diagnosis not present

## 2020-02-10 NOTE — Progress Notes (Signed)
02/10/2020    Endocrinology follow-up note  Subjective:    Patient ID: Charlotte Mccormick, female    DOB: [birth year], PCP Denyce Robert, FNP.   Past Medical History:  Diagnosis Date  . Abnormal uterine bleeding (AUB) 12/12/2015  . Anemia   . Anxiety   . Asthma   . Constipation   . Depression   . Fibroid   . Genital herpes   . GERD (gastroesophageal reflux disease)   . History of chlamydia   . Hyperemesis   . IBS (irritable bowel syndrome)   . Insomnia   . Mental disorder    PTSD, ANXIETY,Depression  . PTSD (post-traumatic stress disorder)   . Sinusitis   . Vaginal dryness 12/12/2015    Past Surgical History:  Procedure Laterality Date  . COLONOSCOPY     2016  . COLONOSCOPY N/A 04/08/2016   Procedure: COLONOSCOPY;  Surgeon: Danie Binder, MD;  Location: [facility];  Service: Endoscopy;  Laterality: N/A;  10:15 AM  . ESOPHAGOGASTRODUODENOSCOPY N/A 04/08/2016   Procedure: ESOPHAGOGASTRODUODENOSCOPY (EGD);  Surgeon: Danie Binder, MD;  Location: [facility];  Service: Endoscopy;  Laterality: N/A;  . ESOPHAGOGASTRODUODENOSCOPY (EGD) WITH PROPOFOL N/A 01/17/2020   Procedure: ESOPHAGOGASTRODUODENOSCOPY (EGD) WITH PROPOFOL;  Surgeon: [name], DO;  Location: [facility];  Service: Endoscopy;  Laterality: N/A;  8:15am  . PERINEUM REPAIR    . UPPER GASTROINTESTINAL ENDOSCOPY  2016    Social History   Socioeconomic History  . Marital status: Married    Spouse name: Not on file  . Number of children: 3  . Years of education: Not on file  . Highest education level: Not on file  Occupational History  . Not on file  Tobacco Use  . Smoking status: Former Smoker    Packs/day: 0.50    Years: 5.00    Pack years: 2.50    Types: Cigarettes    Quit date: 06/03/2010    Years since quitting: 9.6  . Smokeless tobacco: Never Used  Vaping Use  . Vaping Use: Never used  Substance and Sexual Activity  . Alcohol use: Not Currently    Alcohol/week: 2.0  standard drinks    Types: 2 Glasses of wine per week    Comment: occ wine; denied 11/02/19  . Drug use: Yes    Types: Marijuana    Comment: 11/02/19 states she used gummies 1-2 times a month.  . Sexual activity: Yes    Birth control/protection: Pill  Other Topics Concern  . Not on file  Social History Narrative   ** Merged History Encounter **       Social Determinants of Health   Financial Resource Strain:   . Difficulty of Paying Living Expenses: Not on file  Food Insecurity:   . Worried About Charity fundraiser in the Last Year: Not on file  . Ran Out of Food in the Last Year: Not on file  Transportation Needs:   . Lack of Transportation (Medical): Not on file  . Lack of Transportation (Non-Medical): Not on file  Physical Activity:   . Days of Exercise per Week: Not on file  . Minutes of Exercise per Session: Not on file  Stress:   . Feeling of Stress : Not on file  Social Connections:   . Frequency of Communication with Friends and Family: Not on file  . Frequency of Social Gatherings with Friends and Family: Not on file  . Attends Religious Services: Not on  file  . Active Member of Clubs or Organizations: Not on file  . Attends Archivist Meetings: Not on file  . Marital Status: Not on file    Family History  Problem Relation Age of Onset  . Heart disease Mother   . Hypertension Mother   . Hyperlipidemia Mother   . Heart attack Mother   . Heart failure Mother   . Hypertension Father   . Hyperlipidemia Father   . Diabetes Father   . Cancer Father   . Thyroid disease Father   . Stroke Father   . Epilepsy Brother   . Eczema Daughter   . Autism spectrum disorder Son   . Colon cancer Neg Hx     Outpatient Encounter Medications as of 02/10/2020  Medication Sig  . acetaminophen (TYLENOL) 500 MG tablet Take 500 mg by mouth every 6 (six) hours as needed for mild pain or moderate pain.  Marland Kitchen albuterol (VENTOLIN HFA) 108 (90 Base) MCG/ACT inhaler Inhale 1 puff  into the lungs every 6 (six) hours as needed for wheezing or shortness of breath.  Marland Kitchen buPROPion (WELLBUTRIN XL) 300 MG 24 hr tablet Take 1 tablet (300 mg total) by mouth daily.  Marland Kitchen dexlansoprazole (DEXILANT) 60 MG capsule Take 1 capsule (60 mg total) by mouth daily.  . diazepam (VALIUM) 5 MG tablet Take 5 mg by mouth every 6 (six) hours as needed for anxiety.  . dicyclomine (BENTYL) 20 MG tablet Take 1 tablet (20 mg total) by mouth 3 (three) times daily as needed for spasms (abdominal cramping).  Lyndle Herrlich SULFATE PO Take 65 mg by mouth daily.  Marland Kitchen linaclotide (LINZESS) 72 MCG capsule Take 1 capsule (72 mcg total) by mouth daily before breakfast.  . meloxicam (MOBIC) 15 MG tablet Take 15 mg by mouth daily. As needed  . ondansetron (ZOFRAN ODT) 4 MG disintegrating tablet Take 1 tablet (4 mg total) by mouth every 8 (eight) hours as needed. (Patient taking differently: Take 4 mg by mouth every 8 (eight) hours as needed for nausea or vomiting. )  . prazosin (MINIPRESS) 5 MG capsule Take 1 capsule (5 mg total) by mouth at bedtime.  . rizatriptan (MAXALT) 10 MG tablet Take 1 tablet by mouth daily as needed. At onset of headache  . VITAMIN D PO Take 1 tablet by mouth daily.  . [DISCONTINUED] LO LOESTRIN FE 1 MG-10 MCG / 10 MCG tablet Take 1 tablet by mouth daily. (Patient not taking: Reported on 10/13/2019)  . [DISCONTINUED] zolpidem (AMBIEN CR) 12.5 MG CR tablet Take 1 tablet (12.5 mg total) by mouth at bedtime as needed for sleep. (Patient not taking: Reported on 10/13/2019)   No facility-administered encounter medications on file as of 02/10/2020.    ALLERGIES: Allergies  Allergen Reactions  . Bee Venom Swelling and Other (See Comments)    Reaction:  Localized swelling   . Hydrocodone Itching  . Penicillins Itching and Other (See Comments)    Has patient had a PCN reaction causing immediate rash, facial/tongue/throat swelling, SOB or lightheadedness with hypotension: No Has patient had a PCN reaction  causing severe rash involving mucus membranes or skin necrosis: No Has patient had a PCN reaction that required hospitalization No Has patient had a PCN reaction occurring within the last 10 years: Yes If all of the above answers are "NO", then may proceed with Cephalosporin use.   . Reglan [Metoclopramide] Anxiety    VACCINATION STATUS: Immunization History  Administered Date(s) Administered  . Tdap 01/29/2018  HPI  Charlotte Mccormick is 34 y.o. female who presents today to discuss her thyroid function test as well as thyroid uptake and scan after she was seen in consultation for hyperthyroidism, consult requested by Denyce Robert, [place].  she has no new complaints.   Her previous symptoms of intermittent hypothermia, intermittent palpitations have resolved.  Her most recent thyroid function tests are within normal limits, as well as her thyroid uptake and scan which was unremarkable-summarized below.. she denies dysphagia, choking, shortness of breath, no recent voice change.    she has family history of thyroid dysfunction in her father, but denies family hx of thyroid cancer. she denies personal history of goiter. she is not on any anti-thyroid medications nor on any thyroid hormone supplements. she  is willing to proceed with appropriate work up and therapy for thyrotoxicosis.                           Review of systems  Constitutional: + lost weight, + fatigue. Eyes: no blurry vision, - xerophthalmia ENT: no sore throat, no nodules palpated in throat, no dysphagia/odynophagia, nor hoarseness Cardiovascular: no Chest Pain, no Shortness of Breath, -  palpitations, no leg swelling Respiratory: no cough, no SOB Gastrointestinal: no Nausea, no Vomiting, no Diarhhea Musculoskeletal: no muscle/joint aches Skin: no rashes Neurological: -  tremors, no numbness, no tingling, no dizziness Psychiatric: no depression, ++  anxiety   Objective:    BP 106/78   Pulse 84   Ht 5'  2" (1.575 m)   Wt 165 lb 9.6 oz (75.1 kg)   BMI 30.29 kg/m   Wt Readings from Last 3 Encounters:  02/10/20 165 lb 9.6 oz (75.1 kg)  01/24/20 169 lb 9.6 oz (76.9 kg)  01/17/20 190 lb (86.2 kg)                                                Physical exam  Constitutional: Body mass index is 30.29 kg/m., not in acute distress, + stable state of mind Eyes: PERRLA, EOMI, - exophthalmos ENT: moist mucous membranes, + palpable thyromegaly, no cervical lymphadenopathy Cardiovascular: + normal  precordial activity, -tachycardic,  no Murmur/Rubs/Gallops Respiratory:  adequate breathing efforts, no gross chest deformity, Clear to auscultation bilaterally Gastrointestinal: abdomen soft, Non -tender, No distension, Bowel Sounds present Musculoskeletal: no gross deformities, strength intact in all four extremities Skin: moist, warm, no rashes Neurological: -  tremor with outstretched hands,  + Deep Tendon Reflexes  on both lower extremities.   CMP     Component Value Date/Time   NA 135 10/13/2019 1212   NA 136 11/13/2017 1127   K 4.2 10/13/2019 1212   CL 105 10/13/2019 1212   CO2 23 10/13/2019 1212   GLUCOSE 90 10/13/2019 1212   BUN 7 10/15/2019 0000   CREATININE 0.7 10/15/2019 0000   CREATININE 0.69 10/13/2019 1212   CALCIUM 8.9 10/13/2019 1212   PROT 7.7 10/13/2019 1212   PROT 6.8 11/13/2017 1127   ALBUMIN 3.9 10/13/2019 1212   ALBUMIN 3.8 11/13/2017 1127   AST 17 10/13/2019 1212   ALT 24 10/13/2019 1212   ALKPHOS 144 (H) 10/13/2019 1212   BILITOT 0.4 10/13/2019 1212   BILITOT 0.3 11/13/2017 1127   GFRNONAA 107 10/15/2019 0000   GFRAA 123 10/15/2019 0000     CBC  Component Value Date/Time   WBC 4.6 01/27/2020 1426   WBC 3.5 (L) 01/14/2020 1041   RBC 4.47 01/27/2020 1426   RBC 4.10 01/14/2020 1041   HGB 8.6 (L) 01/27/2020 1426   HCT 30.8 (L) 01/27/2020 1426   PLT 186 01/27/2020 1426   MCV 69 (L) 01/27/2020 1426   MCH 19.2 (L) 01/27/2020 1426   MCH 18.5 (L)  01/14/2020 1041   MCHC 27.9 (L) 01/27/2020 1426   MCHC 28.4 (L) 01/14/2020 1041   RDW 23.7 (H) 01/27/2020 1426   LYMPHSABS 1.4 01/27/2020 1426   MONOABS 0.5 01/14/2020 1041   EOSABS 0.1 01/27/2020 1426   BASOSABS 0.0 01/27/2020 1426     Diabetic Labs (most recent): Lab Results  Component Value Date   HGBA1C 5.2 10/15/2019   HGBA1C 5.0 01/23/2019   HGBA1C 4.8 10/07/2016    Lipid Panel     Component Value Date/Time   CHOL 146 10/15/2019 0000   TRIG 56 10/15/2019 0000   HDL 42 10/15/2019 0000   CHOLHDL 3.1 01/23/2019 0623   VLDL 11 01/23/2019 0623   LDLCALC 92 10/15/2019 0000     Lab Results  Component Value Date   TSH 0.664 01/24/2020   TSH 0.37 (A) 10/15/2019   TSH 0.126 (L) 01/23/2019   TSH 0.232 (L) 02/18/2018   FREET4 1.21 01/24/2020   FREET4 0.75 01/24/2019   FREET4 0.90 02/18/2018    Thyroid uptake and scan on February 03, 2020 FINDINGS: Multinodular appearing thyroid lobes.  Dominant warm nodules at mid inferior RIGHT thyroid lobe with additional smaller warm nodules at the superior thyroid lobes bilaterally.  Small cold nodule seen mid to upper RIGHT lobe and mid LEFT lobe.  4 hour I-123 uptake = 13.0% (normal 5-20%),   24 hour I-123 uptake = 27.2% (normal 10-30%)  IMPRESSION: Multinodular thyroid gland.   Normal 4 hour and 24 hour radio iodine uptakes.   Assessment & Plan:   1. Subclinical hyperthyroidism  2. MNG  Her repeat previsit complete thyroid function tests are within normal limits.  Her uptake after 24 hours of I-131 is 27.2%.  Exam demonstrated dominant warm nodules at the mid inferior right thyroid lobe with additional smaller warm nodules at the superior thyroid lobes bilaterally. Her presentation however is euthyroid.  She will not need intervention at this time.  She is approached with observational status and return with thyroid function test in 4 months.  her history and most recent labs are reviewed, and she was examined  clinically. Subjective and objective findings are consistent with subclinical hyperthyroidism . The potential risks of untreated thyrotoxicosis and the need for definitive therapy have been discussed in detail with her, and she agrees to proceed with diagnostic workup and treatment plan.   -In light of her palpable thyroid, she will be considered for thyroid ultrasound since her uptake is unremarkable.    -Her pulse rate is 72.  I did not initiate any new prescription for her today.  -Patient is advised to maintain close follow up with Denyce Robert, FNP for primary care needs.      - Time spent on this patient care encounter:  20 minutes of which 50% was spent in  counseling and the rest reviewing  her current and  previous labs / studies and medications  doses and developing a plan for long term care. Lucile Shields-Lanier  participated in the discussions, expressed understanding, and voiced agreement with the above plans.  All questions were answered to her satisfaction. she  is encouraged to contact clinic should she have any questions or concerns prior to her return visit.  Follow up plan: Return in about 4 months (around 06/11/2020) for F/U with Pre-visit Labs.   Thank you for involving me in the care of this pleasant patient, and I will continue to update you with her progress.  Glade Lloyd, MD [facility] Phone: [phone]  Fax: [phone]   02/10/2020, 5:10 PM  This note was partially dictated with voice recognition software. Similar sounding words can be transcribed inadequately or may not  be corrected upon review.

## 2020-02-11 ENCOUNTER — Telehealth: Payer: Self-pay

## 2020-02-11 NOTE — Telephone Encounter (Signed)
Discussed with pt that we will be scheduling her a thyroid ultrasound in December. Understanding voiced per pt.

## 2020-02-11 NOTE — Telephone Encounter (Signed)
-----   Message from Cassandria Anger, MD sent at 02/10/2020  5:17 PM EDT ----- Caryl Asp, I want to order thyroid ultrasound on this patient before her next visit. I ordered it, but did not discuss it with her.

## 2020-02-23 LAB — TSH: TSH: 0.217 u[IU]/mL — ABNORMAL LOW (ref 0.450–4.500)

## 2020-02-23 LAB — T4, FREE: Free T4: 0.94 ng/dL (ref 0.82–1.77)

## 2020-02-23 LAB — T3, FREE: T3, Free: 3.3 pg/mL (ref 2.0–4.4)

## 2020-02-29 ENCOUNTER — Other Ambulatory Visit (HOSPITAL_COMMUNITY): Payer: Self-pay | Admitting: Psychiatry

## 2020-03-07 ENCOUNTER — Ambulatory Visit: Payer: Medicare Other | Admitting: Nurse Practitioner

## 2020-03-15 ENCOUNTER — Telehealth (INDEPENDENT_AMBULATORY_CARE_PROVIDER_SITE_OTHER): Payer: 59 | Admitting: Psychiatry

## 2020-03-15 ENCOUNTER — Other Ambulatory Visit: Payer: Self-pay

## 2020-03-15 DIAGNOSIS — F410 Panic disorder [episodic paroxysmal anxiety] without agoraphobia: Secondary | ICD-10-CM

## 2020-03-15 DIAGNOSIS — F4310 Post-traumatic stress disorder, unspecified: Secondary | ICD-10-CM | POA: Diagnosis not present

## 2020-03-15 DIAGNOSIS — F331 Major depressive disorder, recurrent, moderate: Secondary | ICD-10-CM | POA: Diagnosis not present

## 2020-03-15 MED ORDER — DIAZEPAM 5 MG PO TABS: 5.0000 mg | ORAL_TABLET | Freq: Three times a day (TID) | ORAL | 0 refills | Status: DC | PRN

## 2020-03-15 MED ORDER — FLUOXETINE HCL 20 MG PO CAPS: 20.0000 mg | ORAL_CAPSULE | Freq: Every day | ORAL | 0 refills | Status: DC

## 2020-03-15 MED ORDER — PRAZOSIN HCL 5 MG PO CAPS: 5.0000 mg | ORAL_CAPSULE | Freq: Every day | ORAL | 2 refills | Status: DC

## 2020-03-15 MED ORDER — BUPROPION HCL ER (XL) 300 MG PO TB24: 300.0000 mg | ORAL_TABLET | Freq: Every day | ORAL | 2 refills | Status: DC
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
## 2020-04-12 ENCOUNTER — Telehealth (HOSPITAL_COMMUNITY): Payer: 59 | Admitting: Psychiatry

## 2020-04-13 ENCOUNTER — Other Ambulatory Visit: Payer: Self-pay

## 2020-04-13 ENCOUNTER — Ambulatory Visit
Admission: EM | Admit: 2020-04-13 | Discharge: 2020-04-13 | Disposition: A | Discharge status: Home / Self Care | Payer: 59 | Attending: Emergency Medicine | Admitting: Emergency Medicine

## 2020-04-13 ENCOUNTER — Encounter: Payer: Self-pay | Admitting: Emergency Medicine

## 2020-04-13 DIAGNOSIS — J029 Acute pharyngitis, unspecified: Secondary | ICD-10-CM | POA: Insufficient documentation

## 2020-04-13 LAB — POCT RAPID STREP A (OFFICE): Rapid Strep A Screen: NEGATIVE

## 2020-04-13 MED ORDER — LIDOCAINE VISCOUS HCL 2 % MT SOLN: 15.0000 mL | OROMUCOSAL | 1 refills | Status: DC | PRN

## 2020-04-13 NOTE — Discharge Instructions (Addendum)
Strep test negative, will send out for culture and we will call you with results Get plenty of rest and push fluids Viscous lidocaine prescribed.  This is an oral solution you can swish, and gargle as needed for symptomatic relief of sore throat.  Do not exceed 8 doses in a 24 hour period.  Do not use prior to eating, as this will numb your entire mouth.   Drink warm or cool liquids, use throat lozenges, or popsicles to help alleviate symptoms Take OTC ibuprofen or tylenol as needed for pain Follow up with PCP if symptoms persists Return or go to ER if patient has any new or worsening symptoms such as fever, chills, nausea, vomiting, worsening sore throat, cough, abdominal pain, chest pain, changes in bowel or bladder habits, etc..Marland Kitchen

## 2020-04-13 NOTE — ED Provider Notes (Signed)
[facility]   [id number] 04/13/20 Arrival Time: 2694  WN:IOEV THROAT  SUBJECTIVE: History from: patient.  Charlotte Mccormick is a 34 y.o. female presented to the urgent care with a complaint of sore throat for the past 1 day.  Denies sick exposure to strep, flu or mono, or precipitating event.  Has tried OTC medication without relief.  Symptoms are made worse with swallowing, but tolerating liquids and own secretions without difficulty.  Report previous symptoms in the past.   Denies fever, chills, fatigue, ear pain, sinus pain, rhinorrhea, nasal congestion, cough, SOB, wheezing, chest pain, nausea, rash, changes in bowel or bladder habits.     ROS: As per HPI.  All other pertinent ROS negative.     Past Medical History:  Diagnosis Date   Abnormal uterine bleeding (AUB) 12/12/2015   Anemia    Anxiety    Asthma    Constipation    Depression    Fibroid    Genital herpes    GERD (gastroesophageal reflux disease)    History of chlamydia    Hyperemesis    IBS (irritable bowel syndrome)    Insomnia    Mental disorder    PTSD, ANXIETY,Depression   PTSD (post-traumatic stress disorder)    Sinusitis    Vaginal dryness 12/12/2015   Past Surgical History:  Procedure Laterality Date   COLONOSCOPY     2016   COLONOSCOPY N/A 04/08/2016   Procedure: COLONOSCOPY;  Surgeon: Danie Binder, MD;  [facility];  Service: Endoscopy;  Laterality: N/A;  10:15 AM   ESOPHAGOGASTRODUODENOSCOPY N/A 04/08/2016   Procedure: ESOPHAGOGASTRODUODENOSCOPY (EGD);  Surgeon: Danie Binder, MD;  [facility];  Service: Endoscopy;  Laterality: N/A;   ESOPHAGOGASTRODUODENOSCOPY (EGD) WITH PROPOFOL N/A 01/17/2020   Procedure: ESOPHAGOGASTRODUODENOSCOPY (EGD) WITH PROPOFOL;  Surgeon: [name], DO;  [facility];  Service: Endoscopy;  Laterality: N/A;  8:15am   PERINEUM REPAIR     UPPER GASTROINTESTINAL ENDOSCOPY  2016   Allergies    Allergen Reactions   Bee Venom Swelling and Other (See Comments)    Reaction:  Localized swelling    Hydrocodone Itching   Penicillins Itching and Other (See Comments)    Has patient had a PCN reaction causing immediate rash, facial/tongue/throat swelling, SOB or lightheadedness with hypotension: No Has patient had a PCN reaction causing severe rash involving mucus membranes or skin necrosis: No Has patient had a PCN reaction that required hospitalization No Has patient had a PCN reaction occurring within the last 10 years: Yes If all of the above answers are "NO", then may proceed with Cephalosporin use.    Reglan [Metoclopramide] Anxiety   No current facility-administered medications on file prior to encounter.   Current Outpatient Medications on File Prior to Encounter  Medication Sig Dispense Refill   acetaminophen (TYLENOL) 500 MG tablet Take 500 mg by mouth every 6 (six) hours as needed for mild pain or moderate pain.     albuterol (VENTOLIN HFA) 108 (90 Base) MCG/ACT inhaler Inhale 1 puff into the lungs every 6 (six) hours as needed for wheezing or shortness of breath.     buPROPion (WELLBUTRIN XL) 300 MG 24 hr tablet Take 1 tablet (300 mg total) by mouth daily. 30 tablet 2   dexlansoprazole (DEXILANT) 60 MG capsule Take 1 capsule (60 mg total) by mouth daily. 90 capsule 3   diazepam (VALIUM) 5 MG tablet Take 1 tablet (5 mg total) by mouth 3 (three) times daily  as needed. for anxiety 90 tablet 0   dicyclomine (BENTYL) 20 MG tablet Take 1 tablet (20 mg total) by mouth 3 (three) times daily as needed for spasms (abdominal cramping). 20 tablet 0   FERROUS SULFATE PO Take 65 mg by mouth daily.     FLUoxetine (PROZAC) 20 MG capsule Take 1 capsule (20 mg total) by mouth daily. 30 capsule 0   linaclotide (LINZESS) 72 MCG capsule Take 1 capsule (72 mcg total) by mouth daily before breakfast. 30 capsule 5   meloxicam (MOBIC) 15 MG tablet Take 15 mg by mouth daily. As needed      ondansetron (ZOFRAN ODT) 4 MG disintegrating tablet Take 1 tablet (4 mg total) by mouth every 8 (eight) hours as needed. (Patient taking differently: Take 4 mg by mouth every 8 (eight) hours as needed for nausea or vomiting. ) 20 tablet 0   prazosin (MINIPRESS) 5 MG capsule Take 1 capsule (5 mg total) by mouth at bedtime. 30 capsule 2   rizatriptan (MAXALT) 10 MG tablet Take 1 tablet by mouth daily as needed. At onset of headache     VITAMIN D PO Take 1 tablet by mouth daily.     [DISCONTINUED] LO LOESTRIN FE 1 MG-10 MCG / 10 MCG tablet Take 1 tablet by mouth daily. (Patient not taking: Reported on 10/13/2019) 3 Package 3   [DISCONTINUED] zolpidem (AMBIEN CR) 12.5 MG CR tablet Take 1 tablet (12.5 mg total) by mouth at bedtime as needed for sleep. (Patient not taking: Reported on 10/13/2019) 30 tablet 2   Social History   Socioeconomic History   Marital status: Married    Spouse name: Not on file   Number of children: 3   Years of education: Not on file   Highest education level: Not on file  Occupational History   Not on file  Tobacco Use   Smoking status: Former Smoker    Packs/day: 0.50    Years: 5.00    Pack years: 2.50    Types: Cigarettes    Quit date: 06/03/2010    Years since quitting: 9.8   Smokeless tobacco: Never Used  Vaping Use   Vaping Use: Never used  Substance and Sexual Activity   Alcohol use: Not Currently    Alcohol/week: 2.0 standard drinks    Types: 2 Glasses of wine per week    Comment: occ wine; denied 11/02/19   Drug use: Yes    Types: Marijuana    Comment: 11/02/19 states she used gummies 1-2 times a month.   Sexual activity: Yes    Birth control/protection: Pill  Other Topics Concern   Not on file  Social History Narrative   ** Merged History Encounter **       Social Determinants of Health   Financial Resource Strain:    Difficulty of Paying Living Expenses: Not on file  Food Insecurity:    Worried About Charity fundraiser in  the Last Year: Not on file   [employer] of Food in the Last Year: Not on file  Transportation Needs:    Lack of Transportation (Medical): Not on file   Lack of Transportation (Non-Medical): Not on file  Physical Activity:    Days of Exercise per Week: Not on file   Minutes of Exercise per Session: Not on file  Stress:    Feeling of Stress : Not on file  Social Connections:    Frequency of Communication with Friends and Family: Not on file  Frequency of Social Gatherings with Friends and Family: Not on file   Attends Religious Services: Not on file   Active Member of Clubs or Organizations: Not on file   Attends Archivist Meetings: Not on file   Marital Status: Not on file  Intimate Partner Violence:    Fear of Current or Ex-Partner: Not on file   Emotionally Abused: Not on file   Physically Abused: Not on file   Sexually Abused: Not on file   Family History  Problem Relation Age of Onset   Heart disease Mother    Hypertension Mother    Hyperlipidemia Mother    Heart attack Mother    Heart failure Mother    Hypertension Father    Hyperlipidemia Father    Diabetes Father    Cancer Father    Thyroid disease Father    Stroke Father    Epilepsy Brother    Eczema Daughter    Autism spectrum disorder Son    Colon cancer Neg Hx     OBJECTIVE:  Vitals:   04/13/20 0939 04/13/20 0940  BP: 125/79   Pulse: 86   Resp: 18   Temp: 98.3 F (36.8 C)   TempSrc: Oral   SpO2: 99%   Weight:  180 lb (81.6 kg)  Height:  5\' 2"  (1.575 m)     General appearance: alert; appears fatigued, but nontoxic, speaking in full sentences and managing own secretions HEENT: NCAT; Ears: EACs clear, TMs pearly gray with visible cone of light, without erythema; Eyes: PERRL, EOMI grossly; Nose: no obvious rhinorrhea; Throat: oropharynx clear, tonsils 1+ and mildly erythematous without white tonsillar exudates, uvula midline Neck: supple without LAD Lungs: CTA  bilaterally without adventitious breath sounds; cough absent Heart: regular rate and rhythm.  Radial pulses 2+ symmetrical bilaterally Skin: warm and dry Psychological: alert and cooperative; normal mood and affect  LABS: Results for orders placed or performed during the hospital encounter of 04/13/20 (from the past 24 hour(s))  POCT rapid strep A     Status: None   Collection Time: 04/13/20  9:54 AM  Result Value Ref Range   Rapid Strep A Screen Negative Negative     ASSESSMENT & PLAN:  1. Sore throat     Meds ordered this encounter  Medications   lidocaine (XYLOCAINE) 2 % solution    Sig: Use as directed 15 mLs in the mouth or throat as needed for mouth pain.    Dispense:  100 mL    Refill:  1   Discharge instructions   Strep test negative, will send out for culture and we will call you with results Get plenty of rest and push fluids Viscous lidocaine prescribed.  This is an oral solution you can swish, and gargle as needed for symptomatic relief of sore throat.  Do not exceed 8 doses in a 24 hour period.  Do not use prior to eating, as this will numb your entire mouth.   Drink warm or cool liquids, use throat lozenges, or popsicles to help alleviate symptoms Take OTC ibuprofen or tylenol as needed for pain Follow up with PCP if symptoms persists Return or go to ER if patient has any new or worsening symptoms such as fever, chills, nausea, vomiting, worsening sore throat, cough, abdominal pain, chest pain, changes in bowel or bladder habits, etc...  Reviewed expectations re: course of current medical issues. Questions answered. Outlined signs and symptoms indicating need for more acute intervention. Patient verbalized understanding. After Visit  Summary given.         Emerson Monte, FNP 04/13/20 1003

## 2020-04-13 NOTE — ED Triage Notes (Signed)
Sore throat that started x 1 days ago.  Denies any other s/s

## 2020-04-16 LAB — CULTURE, GROUP A STREP (THRC)

## 2020-04-18 ENCOUNTER — Other Ambulatory Visit: Payer: Self-pay

## 2020-04-18 ENCOUNTER — Telehealth (INDEPENDENT_AMBULATORY_CARE_PROVIDER_SITE_OTHER): Payer: 59 | Admitting: Psychiatry

## 2020-04-18 DIAGNOSIS — F410 Panic disorder [episodic paroxysmal anxiety] without agoraphobia: Secondary | ICD-10-CM

## 2020-04-18 DIAGNOSIS — F331 Major depressive disorder, recurrent, moderate: Secondary | ICD-10-CM

## 2020-04-18 DIAGNOSIS — F4310 Post-traumatic stress disorder, unspecified: Secondary | ICD-10-CM

## 2020-04-18 MED ORDER — ESCITALOPRAM OXALATE 10 MG PO TABS
10.0000 mg | ORAL_TABLET | Freq: Every day | ORAL | 2 refills | Status: DC
Start: 2020-04-18 — End: 2020-06-20

## 2020-04-18 MED ORDER — DIAZEPAM 5 MG PO TABS: 5.0000 mg | ORAL_TABLET | Freq: Three times a day (TID) | ORAL | 0 refills | Status: DC | PRN
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
## 2020-04-21 ENCOUNTER — Ambulatory Visit (HOSPITAL_COMMUNITY): Payer: 59

## 2020-04-26 ENCOUNTER — Other Ambulatory Visit: Payer: Self-pay

## 2020-04-26 ENCOUNTER — Ambulatory Visit (HOSPITAL_COMMUNITY)
Admission: RE | Admit: 2020-04-26 | Discharge: 2020-04-26 | Disposition: A | Discharge status: Home / Self Care | Payer: 59 | Source: Ambulatory Visit | Attending: "Endocrinology | Admitting: "Endocrinology

## 2020-04-26 ENCOUNTER — Telehealth: Payer: Self-pay

## 2020-04-26 DIAGNOSIS — E0590 Thyrotoxicosis, unspecified without thyrotoxic crisis or storm: Secondary | ICD-10-CM | POA: Diagnosis present

## 2020-04-26 DIAGNOSIS — E042 Nontoxic multinodular goiter: Secondary | ICD-10-CM | POA: Insufficient documentation

## 2020-04-26 NOTE — Telephone Encounter (Signed)
If patient calls back, Dr Dorris Fetch wants to do a phone visit today regarding some results on her thyroid.

## 2020-05-01 ENCOUNTER — Encounter: Payer: Self-pay | Admitting: "Endocrinology

## 2020-05-01 ENCOUNTER — Telehealth (INDEPENDENT_AMBULATORY_CARE_PROVIDER_SITE_OTHER): Payer: 59 | Admitting: "Endocrinology

## 2020-05-01 VITALS — BP 125/83 | HR 93 | Ht 62.0 in | Wt 162.0 lb

## 2020-05-01 DIAGNOSIS — E042 Nontoxic multinodular goiter: Secondary | ICD-10-CM

## 2020-05-01 NOTE — Progress Notes (Signed)
05/01/2020                                   Endocrinology Telehealth Visit Follow up Note -During COVID -19 Pandemic  This visit type was conducted  via telephone due to national recommendations for restrictions regarding the COVID-19 Pandemic  in an effort to limit this patient's exposure and mitigate transmission of the corona virus.   I connected with Charlotte Mccormick on 05/01/2020   by telephone and verified that I am speaking with the correct person using two identifiers. Charlotte Mccormick, 01/06/1986. she has verbally consented to this visit.  I was in my office and patient was in her residence. No other persons were with me during the encounter. All issues noted in this document were discussed and addressed. The format was not optimal for physical exam.  Subjective:    Patient ID: Charlotte Mccormick, female    DOB: [birth year], PCP Denyce Robert, FNP.   Past Medical History:  Diagnosis Date  . Abnormal uterine bleeding (AUB) 12/12/2015  . Anemia   . Anxiety   . Asthma   . Constipation   . Depression   . Fibroid   . Genital herpes   . GERD (gastroesophageal reflux disease)   . History of chlamydia   . Hyperemesis   . IBS (irritable bowel syndrome)   . Insomnia   . Mental disorder    PTSD, ANXIETY,Depression  . PTSD (post-traumatic stress disorder)   . Sinusitis   . Vaginal dryness 12/12/2015    Past Surgical History:  Procedure Laterality Date  . COLONOSCOPY     2016  . COLONOSCOPY N/A 04/08/2016   Procedure: COLONOSCOPY;  Surgeon: Danie Binder, MD;  Location: [facility];  Service: Endoscopy;  Laterality: N/A;  10:15 AM  . ESOPHAGOGASTRODUODENOSCOPY N/A 04/08/2016   Procedure: ESOPHAGOGASTRODUODENOSCOPY (EGD);  Surgeon: Danie Binder, MD;  Location: [facility];  Service: Endoscopy;  Laterality: N/A;  . ESOPHAGOGASTRODUODENOSCOPY (EGD) WITH PROPOFOL N/A 01/17/2020   Procedure: ESOPHAGOGASTRODUODENOSCOPY (EGD) WITH PROPOFOL;  Surgeon: [name], DO;  Location: [facility];  Service: Endoscopy;  Laterality: N/A;  8:15am  . PERINEUM REPAIR    . UPPER GASTROINTESTINAL ENDOSCOPY  2016    Social History   Socioeconomic History  . Marital status: Married    Spouse name: Not on file  . Number of children: 3  . Years of education: Not on file  . Highest education level: Not on file  Occupational History  . Not on file  Tobacco Use  . Smoking status: Former Smoker    Packs/day: 0.50    Years: 5.00    Pack years: 2.50    Types: Cigarettes    Quit date: 06/03/2010    Years since quitting: 9.9  . Smokeless tobacco: Never Used  Vaping Use  . Vaping Use: Never used  Substance and Sexual Activity  . Alcohol use: Not Currently    Alcohol/week: 2.0 standard drinks    Types: 2 Glasses of wine per week    Comment: occ wine; denied 11/02/19  . Drug use: Yes    Types: Marijuana    Comment: 11/02/19 states she used gummies 1-2 times a month.  . Sexual activity: Yes    Birth control/protection: Pill  Other Topics Concern  . Not on file  Social History Narrative   ** Merged History Encounter **  Social Determinants of Health   Financial Resource Strain:   . Difficulty of Paying Living Expenses: Not on file  Food Insecurity:   . Worried About Charity fundraiser in the Last Year: Not on file  . Ran Out of Food in the Last Year: Not on file  Transportation Needs:   . Lack of Transportation (Medical): Not on file  . Lack of Transportation (Non-Medical): Not on file  Physical Activity:   . Days of Exercise per Week: Not on file  . Minutes of Exercise per Session: Not on file  Stress:   . Feeling of Stress : Not on file  Social Connections:   . Frequency of Communication with Friends and Family: Not on file  . Frequency of Social Gatherings with Friends and Family: Not on file  . Attends Religious Services: Not on file  . Active Member of Clubs or Organizations: Not on file  . Attends Archivist  Meetings: Not on file  . Marital Status: Not on file    Family History  Problem Relation Age of Onset  . Heart disease Mother   . Hypertension Mother   . Hyperlipidemia Mother   . Heart attack Mother   . Heart failure Mother   . Hypertension Father   . Hyperlipidemia Father   . Diabetes Father   . Cancer Father   . Thyroid disease Father   . Stroke Father   . Epilepsy Brother   . Eczema Daughter   . Autism spectrum disorder Son   . Colon cancer Neg Hx     Outpatient Encounter Medications as of 05/01/2020  Medication Sig  . acetaminophen (TYLENOL) 500 MG tablet Take 500 mg by mouth every 6 (six) hours as needed for mild pain or moderate pain.  Marland Kitchen albuterol (VENTOLIN HFA) 108 (90 Base) MCG/ACT inhaler Inhale 1 puff into the lungs every 6 (six) hours as needed for wheezing or shortness of breath.  Marland Kitchen buPROPion (WELLBUTRIN XL) 300 MG 24 hr tablet Take 1 tablet (300 mg total) by mouth daily.  Marland Kitchen dexlansoprazole (DEXILANT) 60 MG capsule Take 1 capsule (60 mg total) by mouth daily.  . diazepam (VALIUM) 5 MG tablet Take 1 tablet (5 mg total) by mouth 3 (three) times daily as needed for anxiety. for anxiety  . dicyclomine (BENTYL) 20 MG tablet Take 1 tablet (20 mg total) by mouth 3 (three) times daily as needed for spasms (abdominal cramping).  Marland Kitchen escitalopram (LEXAPRO) 10 MG tablet Take 1 tablet (10 mg total) by mouth daily.  Marland Kitchen FERROUS SULFATE PO Take 65 mg by mouth daily.  Marland Kitchen lidocaine (XYLOCAINE) 2 % solution Use as directed 15 mLs in the mouth or throat as needed for mouth pain.  Marland Kitchen linaclotide (LINZESS) 72 MCG capsule Take 1 capsule (72 mcg total) by mouth daily before breakfast.  . meloxicam (MOBIC) 15 MG tablet Take 15 mg by mouth daily. As needed  . ondansetron (ZOFRAN ODT) 4 MG disintegrating tablet Take 1 tablet (4 mg total) by mouth every 8 (eight) hours as needed. (Patient taking differently: Take 4 mg by mouth every 8 (eight) hours as needed for nausea or vomiting. )  . prazosin  (MINIPRESS) 5 MG capsule Take 1 capsule (5 mg total) by mouth at bedtime.  . rizatriptan (MAXALT) 10 MG tablet Take 1 tablet by mouth daily as needed. At onset of headache  . VITAMIN D PO Take 1 tablet by mouth daily.  . [DISCONTINUED] LO LOESTRIN FE 1 MG-10 MCG /  10 MCG tablet Take 1 tablet by mouth daily. (Patient not taking: Reported on 10/13/2019)  . [DISCONTINUED] zolpidem (AMBIEN CR) 12.5 MG CR tablet Take 1 tablet (12.5 mg total) by mouth at bedtime as needed for sleep. (Patient not taking: Reported on 10/13/2019)   No facility-administered encounter medications on file as of 05/01/2020.    ALLERGIES: Allergies  Allergen Reactions  . Bee Venom Swelling and Other (See Comments)    Reaction:  Localized swelling   . Hydrocodone Itching  . Penicillins Itching and Other (See Comments)    Has patient had a PCN reaction causing immediate rash, facial/tongue/throat swelling, SOB or lightheadedness with hypotension: No Has patient had a PCN reaction causing severe rash involving mucus membranes or skin necrosis: No Has patient had a PCN reaction that required hospitalization No Has patient had a PCN reaction occurring within the last 10 years: Yes If all of the above answers are "NO", then may proceed with Cephalosporin use.   . Reglan [Metoclopramide] Anxiety    VACCINATION STATUS: Immunization History  Administered Date(s) Administered  . Tdap 01/29/2018     HPI  Charlotte Mccormick is 34 y.o. female who who is engaged in telehealth via telephone after she was seen recently in consultation for hyperthyroidism.  She underwent thyroid ultrasound on April 26, 2020 which showed multinodular goiter.     Denyce Robert, FNP.  she has no new complaints.  Ultrasound findings summarized below.  Her previous symptoms of intermittent hypothermia, intermittent palpitations have resolved.  Her most recent thyroid function tests are within normal limits, as well as her thyroid uptake  and scan which was unremarkable-summarized below.. she denies dysphagia, choking, shortness of breath, no recent voice change.    she has family history of thyroid dysfunction in her father, but denies family hx of thyroid cancer. she denies personal history of goiter. she is not on any anti-thyroid medications nor on any thyroid hormone supplements.                           Review of systems     Objective:    BP 125/83   Pulse 93   Ht 5\' 2"  (1.575 m)   Wt 162 lb (73.5 kg)   BMI 29.63 kg/m   Wt Readings from Last 3 Encounters:  05/01/20 162 lb (73.5 kg)  04/13/20 180 lb (81.6 kg)  02/10/20 165 lb 9.6 oz (75.1 kg)                                                Physical exam    CMP     Component Value Date/Time   NA 135 10/13/2019 1212   NA 136 11/13/2017 1127   K 4.2 10/13/2019 1212   CL 105 10/13/2019 1212   CO2 23 10/13/2019 1212   GLUCOSE 90 10/13/2019 1212   BUN 7 10/15/2019 0000   CREATININE 0.7 10/15/2019 0000   CREATININE 0.69 10/13/2019 1212   CALCIUM 8.9 10/13/2019 1212   PROT 7.7 10/13/2019 1212   PROT 6.8 11/13/2017 1127   ALBUMIN 3.9 10/13/2019 1212   ALBUMIN 3.8 11/13/2017 1127   AST 17 10/13/2019 1212   ALT 24 10/13/2019 1212   ALKPHOS 144 (H) 10/13/2019 1212   BILITOT 0.4 10/13/2019 1212   BILITOT 0.3 11/13/2017 1127   GFRNONAA  107 10/15/2019 0000   GFRAA 123 10/15/2019 0000     CBC    Component Value Date/Time   WBC 4.6 01/27/2020 1426   WBC 3.5 (L) 01/14/2020 1041   RBC 4.47 01/27/2020 1426   RBC 4.10 01/14/2020 1041   HGB 8.6 (L) 01/27/2020 1426   HCT 30.8 (L) 01/27/2020 1426   PLT 186 01/27/2020 1426   MCV 69 (L) 01/27/2020 1426   MCH 19.2 (L) 01/27/2020 1426   MCH 18.5 (L) 01/14/2020 1041   MCHC 27.9 (L) 01/27/2020 1426   MCHC 28.4 (L) 01/14/2020 1041   RDW 23.7 (H) 01/27/2020 1426   LYMPHSABS 1.4 01/27/2020 1426   MONOABS 0.5 01/14/2020 1041   EOSABS 0.1 01/27/2020 1426   BASOSABS 0.0 01/27/2020 1426     Diabetic Labs  (most recent): Lab Results  Component Value Date   HGBA1C 5.2 10/15/2019   HGBA1C 5.0 01/23/2019   HGBA1C 4.8 10/07/2016    Lipid Panel     Component Value Date/Time   CHOL 146 10/15/2019 0000   TRIG 56 10/15/2019 0000   HDL 42 10/15/2019 0000   CHOLHDL 3.1 01/23/2019 0623   VLDL 11 01/23/2019 0623   LDLCALC 92 10/15/2019 0000     Lab Results  Component Value Date   TSH 0.217 (L) 02/22/2020   TSH 0.664 01/24/2020   TSH 0.37 (A) 10/15/2019   TSH 0.126 (L) 01/23/2019   TSH 0.232 (L) 02/18/2018   FREET4 0.94 02/22/2020   FREET4 1.21 01/24/2020   FREET4 0.75 01/24/2019   FREET4 0.90 02/18/2018    Thyroid uptake and scan on February 03, 2020 FINDINGS: Multinodular appearing thyroid lobes.  Dominant warm nodules at mid inferior RIGHT thyroid lobe with additional smaller warm nodules at the superior thyroid lobes bilaterally.  Small cold nodule seen mid to upper RIGHT lobe and mid LEFT lobe.  4 hour I-123 uptake = 13.0% (normal 5-20%),   24 hour I-123 uptake = 27.2% (normal 10-30%)  IMPRESSION: Multinodular thyroid gland.   Normal 4 hour and 24 hour radio iodine uptakes.    Thyroid ultrasound April 26, 2020 IMPRESSION: 1. Multinodular goiter. 2. Solid nodule in the mid right thyroid measuring up to 1.2 cm (labeled 3) highly suspicious for malignancy and meets criteria for tissue sampling (TI-RADS category 5). This nodule may correspond to the right mid to superior cold nodule described on recent nuclear medicine study. Recommend ultrasound-guided fine-needle aspiration. 3. Nodules labeled 2 (right superior), 5 (left superior), and 6 (left mid) meet criteria for annual ultrasound surveillance.   Assessment & Plan:   1. Subclinical hyperthyroidism  2. MNG her history and most recent labs as well as imaging studies are reviewed, and she was examined clinically. Her repeat previsit complete thyroid function tests are within normal limits.  Her uptake after  24 hours of I-131 is 27.2%.  Exam demonstrated dominant warm nodules at the mid inferior right thyroid lobe with additional smaller warm nodules at the superior thyroid lobes bilaterally.  In light of her recent thyroid ultrasound showing suspicious nodule in the right lobe, she is approached for fine-needle aspiration biopsy to rule out malignancy.  This study will be scheduled to be done at [facility] in the next several days.  She will return in 2 weeks with her biopsy results.   Her recent presentation was consistent with euthyroidism.   -Her pulse rate is 72.  I did not initiate any new prescription for her today.  -Patient is advised to maintain  close follow up with Denyce Robert, FNP for primary care needs.      - Time spent on this patient care encounter:  20 minutes of which 50% was spent in  counseling and the rest reviewing  her current and  previous labs / studies and medications  doses and developing a plan for long term care. Charlotte Mccormick  participated in the discussions, expressed understanding, and voiced agreement with the above plans.  All questions were answered to her satisfaction. she is encouraged to contact clinic should she have any questions or concerns prior to her return visit.   Follow up plan: Return in about 2 weeks (around 05/15/2020) for F/U with Biopsy Results.   Thank you for involving me in the care of this pleasant patient, and I will continue to update you with her progress.  Glade Lloyd, MD [facility] Phone: [phone]  Fax: [phone]   05/01/2020, 12:00 PM  This note was partially dictated with voice recognition software. Similar sounding words can be transcribed inadequately or may not  be corrected upon review.

## 2020-05-02 ENCOUNTER — Ambulatory Visit: Payer: Medicare Other | Admitting: Nurse Practitioner

## 2020-05-03 ENCOUNTER — Ambulatory Visit (HOSPITAL_COMMUNITY)
Admission: RE | Admit: 2020-05-03 | Discharge: 2020-05-03 | Disposition: A | Discharge status: Home / Self Care | Payer: 59 | Source: Ambulatory Visit | Attending: "Endocrinology | Admitting: "Endocrinology

## 2020-05-03 ENCOUNTER — Encounter (HOSPITAL_COMMUNITY): Payer: Self-pay

## 2020-05-03 ENCOUNTER — Other Ambulatory Visit: Payer: Self-pay

## 2020-05-03 DIAGNOSIS — E041 Nontoxic single thyroid nodule: Secondary | ICD-10-CM | POA: Insufficient documentation

## 2020-05-03 DIAGNOSIS — E042 Nontoxic multinodular goiter: Secondary | ICD-10-CM

## 2020-05-03 MED ORDER — LIDOCAINE HCL (PF) 2 % IJ SOLN
INTRAMUSCULAR | Status: AC
  Administered 2020-05-03: 10 mL via INTRADERMAL
  Filled 2020-05-03: qty 10

## 2020-05-03 MED ORDER — LIDOCAINE HCL (PF) 2 % IJ SOLN: 10.0000 mL | Freq: Once | INTRAMUSCULAR | Status: AC

## 2020-05-03 NOTE — Procedures (Signed)
PreOperative Dx: RIGHT thyroid nodule Postoperative Dx: RIGHT thyroid nodule Procedure:   US guided FNA of RIGHT thyroid nodule Radiologist:  Thornton Papas Anesthesia:  3.5 ml of 2% lidocaine Specimen:  FNA x 6  EBL:   < 1 ml Complications: None

## 2020-05-03 NOTE — Sedation Documentation (Signed)
PT tolerated thyroid biopsy procedure well today. Labs obtained and sent for pathology. PT ambulatory at discharge with no acute distress noted, given ice pack and verbalized understanding of discharge instructions.

## 2020-05-03 NOTE — Discharge Instructions (Signed)
Thyroid Needle Biopsy, Care After This sheet gives you information about how to care for yourself after your procedure. Your health care provider may also give you more specific instructions. If you have problems or questions, contact your health care provider. What can I expect after the procedure? After the procedure, it is common to have:  Soreness and tenderness that lasts for a few days.  Bruising where the needle was inserted (puncture site). Follow these instructions at home:   Take over-the-counter and prescription medicines only as told by your health care provider.  To help ease discomfort, keep your head raised (elevated) when you are lying down. When you move from lying down to sitting up, use both hands to support the back of your head and neck.  Check your puncture site every day for signs of infection. Check for: ? Redness, swelling, or pain. ? Fluid or blood. ? Warmth. ? Pus or a bad smell.  Return to your normal activities as told by your health care provider. Ask your health care provider what activities are safe for you.  Keep all follow-up visits as told by your health care provider. This is important. Contact a health care provider if:  You have redness, swelling, or pain around your puncture site.  You have fluid or blood coming from your puncture site.  Your puncture site feels warm to the touch.  You have pus or a bad smell coming from your puncture site.  You have a fever. Get help right away if:  You have severe bleeding from the puncture site.  You have difficulty swallowing.  You have swollen glands (lymph nodes) in your neck. Summary  It is common to have some bruising and soreness where the needle was inserted in your lower front neck area (puncture site).  Check your puncture site every day for signs of infection, such as redness, swelling, or pain.  Get help right away if you have severe bleeding from your puncture site. This  information is not intended to replace advice given to you by your health care provider. Make sure you discuss any questions you have with your health care provider. Document Revised: 05/02/2017 Document Reviewed: 03/03/2017 Elsevier Patient Education  2020 [employer]  

## 2020-05-04 ENCOUNTER — Encounter: Payer: Self-pay | Admitting: Nurse Practitioner

## 2020-05-04 LAB — CYTOLOGY - NON PAP

## 2020-05-15 ENCOUNTER — Telehealth: Payer: Self-pay

## 2020-05-15 NOTE — Telephone Encounter (Signed)
Patient is requesting her visit tomorrow be virtual due to being in quarantine. If not, we can r/s her.

## 2020-05-15 NOTE — Telephone Encounter (Signed)
We can do virtual.

## 2020-05-16 ENCOUNTER — Telehealth (INDEPENDENT_AMBULATORY_CARE_PROVIDER_SITE_OTHER): Payer: 59 | Admitting: "Endocrinology

## 2020-05-16 ENCOUNTER — Encounter: Payer: Self-pay | Admitting: "Endocrinology

## 2020-05-16 VITALS — Ht 62.0 in | Wt 165.0 lb

## 2020-05-16 DIAGNOSIS — E042 Nontoxic multinodular goiter: Secondary | ICD-10-CM

## 2020-05-16 NOTE — Progress Notes (Signed)
05/01/2020               Endocrinology follow-up note   Subjective:    Patient ID: Charlotte Mccormick, female    DOB: 1986-01-26, PCP Denyce Robert, FNP.  Past Medical History:  Diagnosis Date  . Abnormal uterine bleeding (AUB) 12/12/2015  . Anemia   . Anxiety   . Asthma   . Constipation   . Depression   . Fibroid   . Genital herpes   . GERD (gastroesophageal reflux disease)   . History of chlamydia   . Hyperemesis   . IBS (irritable bowel syndrome)   . Insomnia   . Mental disorder    PTSD, ANXIETY,Depression  . PTSD (post-traumatic stress disorder)   . Sinusitis   . Vaginal dryness 12/12/2015   Past Surgical History:  Procedure Laterality Date  . COLONOSCOPY     2016  . COLONOSCOPY N/A 04/08/2016   Procedure: COLONOSCOPY;  Surgeon: Danie Binder, MD;  [facility];  Service: Endoscopy;  Laterality: N/A;  10:15 AM  . ESOPHAGOGASTRODUODENOSCOPY N/A 04/08/2016   Procedure: ESOPHAGOGASTRODUODENOSCOPY (EGD);  Surgeon: Danie Binder, MD;  Location: [facility];  Service: Endoscopy;  Laterality: N/A;  . ESOPHAGOGASTRODUODENOSCOPY (EGD) WITH PROPOFOL N/A 01/17/2020   Procedure: ESOPHAGOGASTRODUODENOSCOPY (EGD) WITH PROPOFOL;  Surgeon: [name], DO;  Location: [facility];  Service: Endoscopy;  Laterality: N/A;  8:15am  . PERINEUM REPAIR    . UPPER GASTROINTESTINAL ENDOSCOPY  2016   Social Connections: Not on file   Social History   Socioeconomic History  . Marital status: Married    Spouse name: Not on file  . Number of children: 3  . Years of education: Not on file  . Highest education level: Not on file  Occupational History  . Not on file  Tobacco Use  . Smoking status: Former Smoker    Packs/day: 0.50    Years: 5.00    Pack years: 2.50    Types: Cigarettes    Quit date: 06/03/2010    Years since quitting: 9.9  . Smokeless tobacco: Never Used  Vaping Use  . Vaping Use: Never used  Substance and Sexual Activity  .  Alcohol use: Not Currently    Alcohol/week: 2.0 standard drinks    Types: 2 Glasses of wine per week    Comment: occ wine; denied 11/02/19  . Drug use: Yes    Types: Marijuana    Comment: 11/02/19 states she used gummies 1-2 times a month.  . Sexual activity: Yes    Birth control/protection: Pill  Other Topics Concern  . Not on file  Social History Narrative   ** Merged History Encounter **       Social Determinants of Health   Financial Resource Strain: Not on file  Food Insecurity: Not on file  Transportation Needs: Not on file  Physical Activity: Not on file  Stress: Not on file  Social Connections: Not on file  Intimate Partner Violence: Not on file   Family History  Problem Relation Age of Onset  . Heart disease Mother   . Hypertension Mother   . Hyperlipidemia Mother   . Heart attack Mother   . Heart failure Mother   . Hypertension Father   . Hyperlipidemia Father   . Diabetes Father   . Cancer Father   . Thyroid disease Father   . Stroke Father   . Epilepsy Brother   . Eczema Daughter   . Autism  spectrum disorder Son   . Colon cancer Neg Hx    Current Outpatient Medications on File Prior to Visit  Medication Sig Dispense Refill  . acetaminophen (TYLENOL) 500 MG tablet Take 500 mg by mouth every 6 (six) hours as needed for mild pain or moderate pain.    Marland Kitchen albuterol (VENTOLIN HFA) 108 (90 Base) MCG/ACT inhaler Inhale 1 puff into the lungs every 6 (six) hours as needed for wheezing or shortness of breath.    Marland Kitchen buPROPion (WELLBUTRIN XL) 300 MG 24 hr tablet Take 1 tablet (300 mg total) by mouth daily. 30 tablet 2  . dexlansoprazole (DEXILANT) 60 MG capsule Take 1 capsule (60 mg total) by mouth daily. 90 capsule 3  . diazepam (VALIUM) 5 MG tablet Take 1 tablet (5 mg total) by mouth 3 (three) times daily as needed for anxiety. for anxiety 90 tablet 0  . dicyclomine (BENTYL) 20 MG tablet Take 1 tablet (20 mg total) by mouth 3 (three) times daily as needed for spasms  (abdominal cramping). 20 tablet 0  . escitalopram (LEXAPRO) 10 MG tablet Take 1 tablet (10 mg total) by mouth daily. 30 tablet 2  . FERROUS SULFATE PO Take 65 mg by mouth daily.    Marland Kitchen lidocaine (XYLOCAINE) 2 % solution Use as directed 15 mLs in the mouth or throat as needed for mouth pain. 100 mL 1  . linaclotide (LINZESS) 72 MCG capsule Take 1 capsule (72 mcg total) by mouth daily before breakfast. 30 capsule 5  . meloxicam (MOBIC) 15 MG tablet Take 15 mg by mouth daily. As needed    . ondansetron (ZOFRAN ODT) 4 MG disintegrating tablet Take 1 tablet (4 mg total) by mouth every 8 (eight) hours as needed. (Patient not taking: Reported on 05/16/2020) 20 tablet 0  . prazosin (MINIPRESS) 5 MG capsule Take 1 capsule (5 mg total) by mouth at bedtime. 30 capsule 2  . rizatriptan (MAXALT) 10 MG tablet Take 1 tablet by mouth daily as needed. At onset of headache    . VITAMIN D PO Take 1 tablet by mouth daily. (Patient not taking: Reported on 05/16/2020)    . [DISCONTINUED] LO LOESTRIN FE 1 MG-10 MCG / 10 MCG tablet Take 1 tablet by mouth daily. (Patient not taking: Reported on 10/13/2019) 3 Package 3  . [DISCONTINUED] zolpidem (AMBIEN CR) 12.5 MG CR tablet Take 1 tablet (12.5 mg total) by mouth at bedtime as needed for sleep. (Patient not taking: Reported on 10/13/2019) 30 tablet 2   No current facility-administered medications on file prior to visit.   Allergies  Allergen Reactions  . Bee Venom Swelling and Other (See Comments)    Reaction:  Localized swelling   . Hydrocodone Itching  . Penicillins Itching and Other (See Comments)    Has patient had a PCN reaction causing immediate rash, facial/tongue/throat swelling, SOB or lightheadedness with hypotension: No Has patient had a PCN reaction causing severe rash involving mucus membranes or skin necrosis: No Has patient had a PCN reaction that required hospitalization No Has patient had a PCN reaction occurring within the last 10 years: Yes If all of  the above answers are "NO", then may proceed with Cephalosporin use.   . Reglan [Metoclopramide] Anxiety      HPI  Charlotte Mccormick is 34 y.o. female who who is engaged in telehealth via telephone after she was seen recently in consultation for hyperthyroidism.  While she was undergoing work-up for hyperthyroidism, she was found to have multinodular goiter with  1 suspicious nodule.  She was sent for fine-needle aspiration biopsy which she underwent on May 03, 2020.  Results where significant for scant follicular epithelium, satisfactory but limited for evaluation due to scant cellularity.  A sample was sent for Afirma, results are pending.   She has no new complaints today. Her previous symptoms of intermittent hypothermia, intermittent palpitations have resolved.  Her most recent thyroid function tests are within normal limits, as well as her thyroid uptake and scan which was unremarkable-summarized below.. she denies dysphagia, choking, shortness of breath, no recent voice change.    she has family history of thyroid dysfunction in her father, but denies family hx of thyroid cancer. she denies personal history of goiter. she is not on any anti-thyroid medications nor on any thyroid hormone supplements.                           Review of systems     Objective:    BP 125/83   Pulse 93   Ht 5\' 2"  (1.575 m)   Wt 162 lb (73.5 kg)   BMI 29.63 kg/m   Wt Readings from Last 3 Encounters:  05/01/20 162 lb (73.5 kg)  04/13/20 180 lb (81.6 kg)  02/10/20 165 lb 9.6 oz (75.1 kg)                                                Physical exam  Recent Results (from the past 2160 hour(s))  TSH     Status: Abnormal   Collection Time: 02/22/20  4:12 PM  Result Value Ref Range   TSH 0.217 (L) 0.450 - 4.500 uIU/mL  T4, free     Status: None   Collection Time: 02/22/20  4:12 PM  Result Value Ref Range   Free T4 0.94 0.82 - 1.77 ng/dL  T3, free     Status: None   Collection Time:  02/22/20  4:12 PM  Result Value Ref Range   T3, Free 3.3 2.0 - 4.4 pg/mL  POCT rapid strep A     Status: None   Collection Time: 04/13/20  9:54 AM  Result Value Ref Range   Rapid Strep A Screen Negative Negative  Culture, group A strep     Status: None   Collection Time: 04/13/20 10:00 AM   Specimen: Throat  Result Value Ref Range   Specimen Description      THROAT Performed at [facility], [address] [place], [place] [address]    Special Requests      NONE Performed at [facility], [address] [place], [place] [address]    Culture      NO GROUP A STREP (S.PYOGENES) ISOLATED Performed at [facility], [place] [address], [place], [place] [address]    Report Status 04/16/2020 FINAL   Cytology - Non PAP;     Status: None   Collection Time: 05/03/20  9:50 AM  Result Value Ref Range   CYTOLOGY - NON GYN      CYTOLOGY - NON PAP CASE: [id number] PATIENT: Charlotte Mccormick Non-Gynecological Cytology Report     Clinical History: 1.2 cm RML Specimen Submitted:  A. THYROID, RIGHT, FINE NEEDLE ASPIRATION:   FINAL MICROSCOPIC DIAGNOSIS: - Scant follicular epithelium present (Bethesda category I)  SPECIMEN ADEQUACY: Satisfactory but limited for evaluation,  scant cellularity  GROSS: Received is/are 3 slides in 95% Ethyl alcohol, 3 air dried slides for diff stain, and 30 ccs of clear colorless Cytolyt solution. (CM:cm) Prepared: Smears:  6 Concentration Method (Thin Prep):  1 Cell Block:  Cell block attempted, not obtained. Additional Studies:  Also there was an Afirma collected.     Final Diagnosis performed by Claudette Laws, MD.   Electronically signed 05/04/2020 Technical component performed at [facility], [place], [place] [address], [place], [place] [address].  Professional component performed at [employer]. [facility], [place] [address],  [place], [place] [address].  Immunohistochemistry Technical component (if applicable) was  performed at [facility]. [address] [place], [place], [place] [address].   IMMUNOHISTOCHEMISTRY DISCLAIMER (if applicable): Some of these immunohistochemical stains may have been developed and the performance characteristics determine by [facility]. Some may not have been cleared or approved by the U.S. Food and Drug Administration. The FDA has determined that such clearance or approval is not necessary. This test is used for clinical purposes. It should not be regarded as investigational or for research. This laboratory is certified under the [place] (CLIA-88) as qualified to perform high complexity clinical laboratory testing.  The controls stained appropriately.    Results for Charlotte Mccormick, Charlotte Mccormick (MRN [id number]) as of 05/16/2020 15:06  Ref. Range 01/24/2020 16:30 02/22/2020 16:12  TSH Latest Ref Range: 0.450 - 4.500 uIU/mL 0.664 0.217 (L)  Triiodothyronine,Free,Serum Latest Ref Range: 2.0 - 4.4 pg/mL 3.8 3.3  T4,Free(Direct) Latest Ref Range: 0.82 - 1.77 ng/dL 1.21 0.94  Thyroperoxidase Ab SerPl-aCnc Latest Ref Range: 0 - 34 IU/mL 13   Thyroglobulin Antibody Latest Ref Range: 0.0 - 0.9 IU/mL <1.0      Thyroid uptake and scan on February 03, 2020 FINDINGS: Multinodular appearing thyroid lobes.  Dominant warm nodules at mid inferior RIGHT thyroid lobe with additional smaller warm nodules at the superior thyroid lobes bilaterally.  Small cold nodule seen mid to upper RIGHT lobe and mid LEFT lobe.  4 hour I-123 uptake = 13.0% (normal 5-20%),   24 hour I-123 uptake = 27.2% (normal 10-30%)  IMPRESSION: Multinodular thyroid gland.   Normal 4 hour and 24 hour radio iodine uptakes.    Thyroid ultrasound April 26, 2020 IMPRESSION: 1. Multinodular goiter. 2. Solid nodule in the mid right thyroid measuring up to 1.2 cm (labeled 3) highly suspicious for malignancy and meets criteria for tissue  sampling (TI-RADS category 5). This nodule may correspond to the right mid to superior cold nodule described on recent nuclear medicine study. Recommend ultrasound-guided fine-needle aspiration. 3. Nodules labeled 2 (right superior), 5 (left superior), and 6 (left mid) meet criteria for annual ultrasound surveillance.    Fine-needle aspiration of a thyroid nodule on May 03, 2020 FINAL MICROSCOPIC DIAGNOSIS:  - Scant follicular epithelium present (Bethesda category I)   SPECIMEN ADEQUACY:  Satisfactory but limited for evaluation, scant cellularity   GROSS:  Received is/are 3 slides in 95% Ethyl alcohol, 3 air dried slides for  diff stain, and 30 ccs of clear colorless Cytolyt solution. (CM:cm)  Prepared:  Smears: 6  Concentration Method (Thin Prep): 1  Cell Block: Cell block attempted, not obtained.  Additional Studies: Also there was an Afirma collected.    Assessment & Plan:   1. Subclinical hyperthyroidism  2. MNG her history and most recent labs as well as biopsy results were reviewed with the patient.    Her repeat previsit complete  thyroid function tests are within normal limits.  Her uptake after 24 hours of I-131 is 27.2%.   Her biopsy results was inconclusive at this time.  Afirma molecular testing is underway and results are pending.  She will return in 5 weeks to discuss results.   If her results are benign, she will not need any antithyroid intervention at this time in light of her recent presentation with euthyroid state.  -Patient is advised to maintain close follow up with Denyce Robert, FNP for primary care needs.     - Time spent on this patient care encounter:  20 minutes of which 50% was spent in  counseling and the rest reviewing  her current and  previous labs / studies and medications  doses and developing a plan for long term care. Charlotte Mccormick  participated in the discussions, expressed understanding, and voiced agreement with the above  plans.  All questions were answered to her satisfaction. she is encouraged to contact clinic should she have any questions or concerns prior to her return visit.   Follow up plan: Return in about 2 weeks (around 05/15/2020) for F/U with Biopsy Results.   Thank you for involving me in the care of this pleasant patient, and I will continue to update you with her progress.  Glade Lloyd, MD [facility] Phone: [phone]  Fax: [phone]   05/01/2020, 12:00 PM  This note was partially dictated with voice recognition software. Similar sounding words can be transcribed inadequately or may not  be corrected upon review.

## 2020-05-24 ENCOUNTER — Other Ambulatory Visit: Payer: Self-pay

## 2020-05-24 ENCOUNTER — Telehealth (INDEPENDENT_AMBULATORY_CARE_PROVIDER_SITE_OTHER): Payer: 59 | Admitting: Psychiatry

## 2020-05-24 DIAGNOSIS — F4310 Post-traumatic stress disorder, unspecified: Secondary | ICD-10-CM | POA: Diagnosis not present

## 2020-05-24 DIAGNOSIS — F331 Major depressive disorder, recurrent, moderate: Secondary | ICD-10-CM | POA: Diagnosis not present

## 2020-05-24 MED ORDER — DIAZEPAM 5 MG PO TABS: 5.0000 mg | ORAL_TABLET | Freq: Three times a day (TID) | ORAL | 0 refills | Status: DC | PRN

## 2020-05-24 MED ORDER — BUPROPION HCL ER (XL) 300 MG PO TB24: 300.0000 mg | ORAL_TABLET | Freq: Every day | ORAL | 2 refills | Status: DC

## 2020-05-24 MED ORDER — PRAZOSIN HCL 5 MG PO CAPS: 5.0000 mg | ORAL_CAPSULE | Freq: Every day | ORAL | 2 refills | Status: DC
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
## 2020-06-01 ENCOUNTER — Other Ambulatory Visit: Payer: 59

## 2020-06-01 DIAGNOSIS — Z20822 Contact with and (suspected) exposure to covid-19: Secondary | ICD-10-CM

## 2020-06-04 LAB — NOVEL CORONAVIRUS, NAA: SARS-CoV-2, NAA: DETECTED — AB

## 2020-06-05 ENCOUNTER — Telehealth: Payer: Self-pay | Admitting: "Endocrinology

## 2020-06-05 NOTE — Telephone Encounter (Signed)
Pt said she tested positive for covid and would like to know if we can do anything for her to be able to get the infusion treatments.

## 2020-06-05 NOTE — Telephone Encounter (Signed)
Returned call to patient, she will contact infusion center and pCP if needed

## 2020-06-05 NOTE — Telephone Encounter (Signed)
Please advise, I am not sure where they go to do this or if they need there PCP to order?

## 2020-06-05 NOTE — Telephone Encounter (Signed)
Nothing here. I believe the test center will guide her on what to do next.

## 2020-06-09 ENCOUNTER — Other Ambulatory Visit: Payer: Medicare HMO

## 2020-06-09 ENCOUNTER — Other Ambulatory Visit: Payer: Self-pay

## 2020-06-09 DIAGNOSIS — Z20822 Contact with and (suspected) exposure to covid-19: Secondary | ICD-10-CM

## 2020-06-12 ENCOUNTER — Ambulatory Visit: Payer: 59 | Admitting: "Endocrinology

## 2020-06-12 LAB — NOVEL CORONAVIRUS, NAA: SARS-CoV-2, NAA: NOT DETECTED

## 2020-06-13 ENCOUNTER — Ambulatory Visit: Payer: Medicare Other | Admitting: Nurse Practitioner

## 2020-06-15 ENCOUNTER — Ambulatory Visit: Payer: Medicare HMO | Attending: Internal Medicine

## 2020-06-15 DIAGNOSIS — Z23 Encounter for immunization: Secondary | ICD-10-CM

## 2020-06-15 NOTE — Progress Notes (Signed)
s   Covid-19 Vaccination Clinic  Name:  Charlotte Mccormick    MRN: [id number] DOB: [birth year]  06/15/2020  [name] was observed post Covid-19 immunization for 15 minutes without incident. She was provided with Vaccine Information Sheet and instruction to access the V-Safe system.   [name] was instructed to call 911 with any severe reactions post vaccine: Marland Kitchen Difficulty breathing  . Swelling of face and throat  . A fast heartbeat  . A bad rash all over body  . Dizziness and weakness   Immunizations Administered    Name Date Dose VIS Date Route   Moderna Covid-19 Booster Vaccine 06/15/2020  2:48 PM 0.25 mL 03/22/2020 Intramuscular   Manufacturer: Moderna   Lot: [id number]   [place]: [id number]

## 2020-06-20 ENCOUNTER — Ambulatory Visit: Payer: 59 | Admitting: "Endocrinology

## 2020-06-20 ENCOUNTER — Other Ambulatory Visit: Payer: Medicare HMO

## 2020-06-20 ENCOUNTER — Telehealth: Payer: Self-pay

## 2020-06-20 ENCOUNTER — Other Ambulatory Visit: Payer: Self-pay

## 2020-06-20 ENCOUNTER — Telehealth (INDEPENDENT_AMBULATORY_CARE_PROVIDER_SITE_OTHER): Payer: Medicare HMO | Admitting: Psychiatry

## 2020-06-20 DIAGNOSIS — F410 Panic disorder [episodic paroxysmal anxiety] without agoraphobia: Secondary | ICD-10-CM | POA: Diagnosis not present

## 2020-06-20 DIAGNOSIS — F4310 Post-traumatic stress disorder, unspecified: Secondary | ICD-10-CM

## 2020-06-20 DIAGNOSIS — F330 Major depressive disorder, recurrent, mild: Secondary | ICD-10-CM

## 2020-06-20 MED ORDER — ESCITALOPRAM OXALATE 10 MG PO TABS: 10.0000 mg | ORAL_TABLET | Freq: Every day | ORAL | 2 refills | Status: DC

## 2020-06-20 MED ORDER — DIAZEPAM 5 MG PO TABS: 5.0000 mg | ORAL_TABLET | Freq: Three times a day (TID) | ORAL | 0 refills | Status: AC | PRN

## 2020-06-20 NOTE — Telephone Encounter (Signed)
My understanding from Morton at [place] that [facility] always sends a sample to them but unless it is atypical abnormal that [facility] doesn't send out for afirma.

## 2020-06-20 NOTE — Telephone Encounter (Signed)
Talked with Langley Gauss from [facility] cytology, she stated that pt was a negative case so they do not send out for afirma unless they are atypical abormal. Stated it was rated a 1 and they only send out if rated a 3 or above.

## 2020-06-20 NOTE — Telephone Encounter (Signed)
Ok, she needs to be back on sch . The report said a sample was sent out.
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
## 2020-06-21 ENCOUNTER — Other Ambulatory Visit: Payer: Medicare HMO

## 2020-06-21 DIAGNOSIS — Z20822 Contact with and (suspected) exposure to covid-19: Secondary | ICD-10-CM

## 2020-06-23 ENCOUNTER — Other Ambulatory Visit: Payer: Medicare HMO

## 2020-06-23 LAB — SARS-COV-2, NAA 2 DAY TAT

## 2020-06-23 LAB — NOVEL CORONAVIRUS, NAA: SARS-CoV-2, NAA: NOT DETECTED

## 2020-06-26 ENCOUNTER — Ambulatory Visit: Payer: Medicare HMO | Admitting: "Endocrinology

## 2020-06-28 ENCOUNTER — Other Ambulatory Visit: Payer: Self-pay

## 2020-06-28 ENCOUNTER — Encounter: Payer: Self-pay | Admitting: "Endocrinology

## 2020-06-28 ENCOUNTER — Ambulatory Visit: Payer: Medicare HMO | Admitting: "Endocrinology

## 2020-06-28 VITALS — BP 108/72 | HR 80 | Ht 62.0 in | Wt 170.0 lb

## 2020-06-28 DIAGNOSIS — E0520 Thyrotoxicosis with toxic multinodular goiter without thyrotoxic crisis or storm: Secondary | ICD-10-CM | POA: Diagnosis not present

## 2020-06-28 DIAGNOSIS — E042 Nontoxic multinodular goiter: Secondary | ICD-10-CM | POA: Insufficient documentation

## 2020-06-28 MED ORDER — METHIMAZOLE 5 MG PO TABS: 5.0000 mg | ORAL_TABLET | Freq: Every day | ORAL | 3 refills | Status: DC

## 2020-06-28 NOTE — Progress Notes (Signed)
05/01/2020               Endocrinology follow-up note   Subjective:    Patient ID: Charlotte Mccormick, female    DOB: January 29, 1986, PCP Denyce Robert, FNP.  Past Medical History:  Diagnosis Date  . Abnormal uterine bleeding (AUB) 12/12/2015  . Anemia   . Anxiety   . Asthma   . Constipation   . Depression   . Fibroid   . Genital herpes   . GERD (gastroesophageal reflux disease)   . History of chlamydia   . Hyperemesis   . IBS (irritable bowel syndrome)   . Insomnia   . Mental disorder    PTSD, ANXIETY,Depression  . PTSD (post-traumatic stress disorder)   . Sinusitis   . Vaginal dryness 12/12/2015   Past Surgical History:  Procedure Laterality Date  . COLONOSCOPY     2016  . COLONOSCOPY N/A 04/08/2016   Procedure: COLONOSCOPY;  Surgeon: Danie Binder, MD;  [facility];  Service: Endoscopy;  Laterality: N/A;  10:15 AM  . ESOPHAGOGASTRODUODENOSCOPY N/A 04/08/2016   Procedure: ESOPHAGOGASTRODUODENOSCOPY (EGD);  Surgeon: Danie Binder, MD;  Location[facility];  Service: Endoscopy;  Laterality: N/A;  . ESOPHAGOGASTRODUODENOSCOPY (EGD) WITH PROPOFOL N/A 01/17/2020   Procedure: ESOPHAGOGASTRODUODENOSCOPY (EGD) WITH PROPOFOL;  Surgeon: [name], DO;  [facility];  Service: Endoscopy;  Laterality: N/A;  8:15am  . PERINEUM REPAIR    . UPPER GASTROINTESTINAL ENDOSCOPY  2016   Social Connections: Not on file   Social History   Socioeconomic History  . Marital status: Married    Spouse name: Not on file  . Number of children: 3  . Years of education: Not on file  . Highest education level: Not on file  Occupational History  . Not on file  Tobacco Use  . Smoking status: Former Smoker    Packs/day: 0.50    Years: 5.00    Pack years: 2.50    Types: Cigarettes    Quit date: 06/03/2010    Years since quitting: 10.0  . Smokeless tobacco: Never Used  Vaping Use  . Vaping Use: Never used  Substance and Sexual Activity  .  Alcohol use: Not Currently    Alcohol/week: 2.0 standard drinks    Types: 2 Glasses of wine per week    Comment: occ wine; denied 11/02/19  . Drug use: Yes    Types: Marijuana    Comment: 11/02/19 states she used gummies 1-2 times a month.  . Sexual activity: Yes    Birth control/protection: Pill  Other Topics Concern  . Not on file  Social History Narrative   ** Merged History Encounter **       Social Determinants of Health   Financial Resource Strain: Not on file  Food Insecurity: Not on file  Transportation Needs: Not on file  Physical Activity: Not on file  Stress: Not on file  Social Connections: Not on file  Intimate Partner Violence: Not on file   Family History  Problem Relation Age of Onset  . Heart disease Mother   . Hypertension Mother   . Hyperlipidemia Mother   . Heart attack Mother   . Heart failure Mother   . Hypertension Father   . Hyperlipidemia Father   . Diabetes Father   . Cancer Father   . Thyroid disease Father   . Stroke Father   . Epilepsy Brother   . Eczema Daughter   . Autism  spectrum disorder Son   . Colon cancer Neg Hx    Current Outpatient Medications on File Prior to Visit  Medication Sig Dispense Refill  . acetaminophen (TYLENOL) 500 MG tablet Take 500 mg by mouth every 6 (six) hours as needed for mild pain or moderate pain.    Marland Kitchen albuterol (VENTOLIN HFA) 108 (90 Base) MCG/ACT inhaler Inhale 1 puff into the lungs every 6 (six) hours as needed for wheezing or shortness of breath.    Marland Kitchen buPROPion (WELLBUTRIN XL) 300 MG 24 hr tablet Take 1 tablet (300 mg total) by mouth daily. 30 tablet 2  . dexlansoprazole (DEXILANT) 60 MG capsule Take 1 capsule (60 mg total) by mouth daily. 90 capsule 3  . [START ON 07/02/2020] diazepam (VALIUM) 5 MG tablet Take 1 tablet (5 mg total) by mouth 3 (three) times daily as needed for anxiety. for anxiety 90 tablet 0  . dicyclomine (BENTYL) 20 MG tablet Take 1 tablet (20 mg total) by mouth 3 (three) times daily as  needed for spasms (abdominal cramping). 20 tablet 0  . [START ON 07/17/2020] escitalopram (LEXAPRO) 10 MG tablet Take 1 tablet (10 mg total) by mouth daily. 30 tablet 2  . FERROUS SULFATE PO Take 65 mg by mouth daily.    Marland Kitchen lidocaine (XYLOCAINE) 2 % solution Use as directed 15 mLs in the mouth or throat as needed for mouth pain. (Patient not taking: Reported on 06/28/2020) 100 mL 1  . linaclotide (LINZESS) 72 MCG capsule Take 1 capsule (72 mcg total) by mouth daily before breakfast. 30 capsule 5  . meloxicam (MOBIC) 15 MG tablet Take 15 mg by mouth daily. As needed    . ondansetron (ZOFRAN ODT) 4 MG disintegrating tablet Take 1 tablet (4 mg total) by mouth every 8 (eight) hours as needed. (Patient not taking: Reported on 05/16/2020) 20 tablet 0  . prazosin (MINIPRESS) 5 MG capsule Take 1 capsule (5 mg total) by mouth at bedtime. 30 capsule 2  . rizatriptan (MAXALT) 10 MG tablet Take 1 tablet by mouth daily as needed. At onset of headache    . VITAMIN D PO Take 1 tablet by mouth daily. (Patient not taking: Reported on 05/16/2020)    . [DISCONTINUED] LO LOESTRIN FE 1 MG-10 MCG / 10 MCG tablet Take 1 tablet by mouth daily. (Patient not taking: Reported on 10/13/2019) 3 Package 3  . [DISCONTINUED] zolpidem (AMBIEN CR) 12.5 MG CR tablet Take 1 tablet (12.5 mg total) by mouth at bedtime as needed for sleep. (Patient not taking: Reported on 10/13/2019) 30 tablet 2   No current facility-administered medications on file prior to visit.   Allergies  Allergen Reactions  . Bee Venom Swelling and Other (See Comments)    Reaction:  Localized swelling   . Hydrocodone Itching  . Penicillins Itching and Other (See Comments)    Has patient had a PCN reaction causing immediate rash, facial/tongue/throat swelling, SOB or lightheadedness with hypotension: No Has patient had a PCN reaction causing severe rash involving mucus membranes or skin necrosis: No Has patient had a PCN reaction that required hospitalization  No Has patient had a PCN reaction occurring within the last 10 years: Yes If all of the above answers are "NO", then may proceed with Cephalosporin use.   . Reglan [Metoclopramide] Anxiety      HPI  Charlotte Mccormick is 35 y.o. female who who is returning for follow-up after she was seen in consultation for hyperthyroidism.  While she was undergoing work-up for  hyperthyroidism, she was found to have multinodular goiter with 1 suspicious nodule.  She was sent for fine-needle aspiration biopsy which she underwent on May 03, 2020.  Results where significant for scant follicular epithelium, satisfactory but limited for evaluation due to scant cellularity.  She has no new complaints today.  She still reports intermittent hyperthermia, palpitations.  Her most recent thyroid function tests are within normal limits, as well as her thyroid uptake and scan which was indicative of subclinical hyperthyroidism.  Her uptake and scan was consistent with areas of increased activity, overall uptake was 27%.   she denies dysphagia, choking, shortness of breath, no recent voice change.    she has family history of thyroid dysfunction in her father, but denies family hx of thyroid cancer. she denies personal history of goiter. she is not on any anti-thyroid medications nor on any thyroid hormone supplements.                           Review of systems     Objective:    BP 125/83   Pulse 93   Ht 5\' 2"  (1.575 m)   Wt 162 lb (73.5 kg)   BMI 29.63 kg/m   Wt Readings from Last 3 Encounters:  05/01/20 162 lb (73.5 kg)  04/13/20 180 lb (81.6 kg)  02/10/20 165 lb 9.6 oz (75.1 kg)                                                Physical exam  Recent Results (from the past 2160 hour(s))  POCT rapid strep A     Status: None   Collection Time: 04/13/20  9:54 AM  Result Value Ref Range   Rapid Strep A Screen Negative Negative  Culture, group A strep     Status: None   Collection Time: 04/13/20  10:00 AM   Specimen: Throat  Result Value Ref Range   Specimen Description      THROAT Performed at [facility], [address], [place], [place] [address]    Special Requests      NONE Performed at [facility], [address], [place], [place] [address]    Culture      NO GROUP A STREP (S.PYOGENES) ISOLATED Performed at [facility], [address] [place], [place] [address]    Report Status 04/16/2020 FINAL   Cytology - Non PAP;     Status: None   Collection Time: 05/03/20  9:50 AM  Result Value Ref Range   CYTOLOGY - NON GYN      CYTOLOGY - NON PAP CASE: [id number] PATIENT: Charlotte Mccormick Non-Gynecological Cytology Report     Clinical History: 1.2 cm RML Specimen Submitted:  A. THYROID, RIGHT, FINE NEEDLE ASPIRATION:   FINAL MICROSCOPIC DIAGNOSIS: - Scant follicular epithelium present (Bethesda category I)  SPECIMEN ADEQUACY: Satisfactory but limited for evaluation, scant cellularity  GROSS: Received is/are 3 slides in 95% Ethyl alcohol, 3 air dried slides for diff stain, and 30 ccs of clear colorless Cytolyt solution. (CM:cm) Prepared: Smears:  6 Concentration Method (Thin Prep):  1 Cell Block:  Cell block attempted, not obtained. Additional Studies:  Also there was an Afirma collected.     Final Diagnosis performed by Jimmy Picket, MD.   Electronically signed 05/04/2020 Technical component performed at [facility]  [facility], [place] [address] [place], [place] [address].  Professional component performed at [employer]. [facility], [place] [address],  [place], [place] [address].  Immunohistochemistry Technical component (if applicable) was performed at [facility]. [address] [place], [place], [place] [address].   IMMUNOHISTOCHEMISTRY DISCLAIMER (if applicable): Some of these immunohistochemical stains may have been developed and the performance characteristics determine by [facility].  Some may not have been cleared or approved by the U.S. Food and Drug Administration. The FDA has determined that such clearance or approval is not necessary. This test is used for clinical purposes. It should not be regarded as investigational or for research. This laboratory is certified under the [place] (CLIA-88) as qualified to perform high complexity clinical laboratory testing.  The controls stained appropriately.   Novel Coronavirus, NAA ([facility])     Status: Abnormal   Collection Time: 06/01/20 10:19 AM   Specimen: Nasopharyngeal(NP) swabs in vial transport medium   Nasopharynge  Result Value Ref Range   SARS-CoV-2, NAA Detected (A) Not Detected    Comment: Patients who have a positive COVID-19 test result may now have treatment options. Treatment options are available for patients with mild to moderate symptoms and for hospitalized patients. Visit our website at [email or web address] for resources and information. This nucleic acid amplification test was developed and its performance characteristics determined by [employer]. Nucleic acid amplification tests include RT-PCR and TMA. This test has not been FDA cleared or approved. This test has been authorized by FDA under an Emergency Use Authorization (EUA). This test is only authorized for the duration of time the declaration that circumstances exist justifying the authorization of the emergency use of in vitro diagnostic tests for detection of SARS-CoV-2 virus and/or diagnosis of COVID-19 infection under section 564(b)(1) of the Act, 21 U.S.C. 297LGX-2(J) (1), unless the authorization is terminated or revoked sooner. When diagnostic testing is negativ e, the possibility of a false negative result should be considered in the context of a patient's recent exposures and the presence of clinical signs and symptoms consistent with COVID-19. An individual without  symptoms of COVID-19 and who is not shedding SARS-CoV-2 virus would expect to have a negative (not detected) result in this assay.   Novel Coronavirus, NAA ([facility])     Status: None   Collection Time: 06/09/20  1:19 PM   Specimen: Nasopharyngeal(NP) swabs in vial transport medium   Nasopharynge  Screenin  Result Value Ref Range   SARS-CoV-2, NAA Not Detected Not Detected    Comment: This nucleic acid amplification test was developed and its performance characteristics determined by [employer]. Nucleic acid amplification tests include RT-PCR and TMA. This test has not been FDA cleared or approved. This test has been authorized by FDA under an Emergency Use Authorization (EUA). This test is only authorized for the duration of time the declaration that circumstances exist justifying the authorization of the emergency use of in vitro diagnostic tests for detection of SARS-CoV-2 virus and/or diagnosis of COVID-19 infection under section 564(b)(1) of the Act, 21 U.S.C. 194RDE-0(C) (1), unless the authorization is terminated or revoked sooner. When diagnostic testing is negative, the possibility of a false negative result should be considered in the context of a patient's recent exposures and the presence of clinical signs and symptoms consistent with COVID-19. An individual without symptoms of COVID-19 and who is not shedding SARS-CoV-2 virus wo uld expect to have a negative (not detected) result in this  assay.   Novel Coronavirus, NAA ([facility])     Status: None   Collection Time: 06/21/20  5:52 PM   Specimen: Nasopharyngeal(NP) swabs in vial transport medium   Nasopharynge  Screenin  Result Value Ref Range   SARS-CoV-2, NAA Not Detected Not Detected    Comment: This nucleic acid amplification test was developed and its performance characteristics determined by [employer]. Nucleic acid amplification tests include RT-PCR and TMA. This test has not been FDA cleared  or approved. This test has been authorized by FDA under an Emergency Use Authorization (EUA). This test is only authorized for the duration of time the declaration that circumstances exist justifying the authorization of the emergency use of in vitro diagnostic tests for detection of SARS-CoV-2 virus and/or diagnosis of COVID-19 infection under section 564(b)(1) of the Act, 21 U.S.C. GF:7541899) (1), unless the authorization is terminated or revoked sooner. When diagnostic testing is negative, the possibility of a false negative result should be considered in the context of a patient's recent exposures and the presence of clinical signs and symptoms consistent with COVID-19. An individual without symptoms of COVID-19 and who is not shedding SARS-CoV-2 virus wo uld expect to have a negative (not detected) result in this assay.   SARS-COV-2, NAA 2 DAY TAT     Status: None   Collection Time: 06/21/20  5:52 PM   Nasopharynge  Screenin  Result Value Ref Range   SARS-CoV-2, NAA 2 DAY TAT Performed        Thyroid uptake and scan on February 03, 2020 FINDINGS: Multinodular appearing thyroid lobes.  Dominant warm nodules at mid inferior RIGHT thyroid lobe with additional smaller warm nodules at the superior thyroid lobes bilaterally.  Small cold nodule seen mid to upper RIGHT lobe and mid LEFT lobe.  4 hour I-123 uptake = 13.0% (normal 5-20%),   24 hour I-123 uptake = 27.2% (normal 10-30%)  IMPRESSION: Multinodular thyroid gland.   Normal 4 hour and 24 hour radio iodine uptakes.    Thyroid ultrasound April 26, 2020 IMPRESSION: 1. Multinodular goiter. 2. Solid nodule in the mid right thyroid measuring up to 1.2 cm (labeled 3) highly suspicious for malignancy and meets criteria for tissue sampling (TI-RADS category 5). This nodule may correspond to the right mid to superior cold nodule described on recent nuclear medicine study. Recommend ultrasound-guided fine-needle  aspiration. 3. Nodules labeled 2 (right superior), 5 (left superior), and 6 (left mid) meet criteria for annual ultrasound surveillance.    Fine-needle aspiration of a thyroid nodule on May 03, 2020 FINAL MICROSCOPIC DIAGNOSIS:  - Scant follicular epithelium present (Bethesda category I)   SPECIMEN ADEQUACY:  Satisfactory but limited for evaluation, scant cellularity   GROSS:  Received is/are 3 slides in 95% Ethyl alcohol, 3 air dried slides for  diff stain, and 30 ccs of clear colorless Cytolyt solution. (CM:cm)  Prepared:  Smears: 6  Concentration Method (Thin Prep): 1  Cell Block: Cell block attempted, not obtained.      Assessment & Plan:   1. Subclinical hyperthyroidism  2. MNG Her biopsy results are benign. Her repeat previsit complete thyroid function tests are consistent with subclinical hyperthyroidism, her uptake after 24 hours of I-131 is 27.2%.  With bilateral areas of increased uptake. -She will not need surgery.  However, she would benefit from low-dose methimazole to stabilize her thyroid function test.  I discussed and prescribed methimazole 5 mg p.o. daily at breakfast with plan to repeat her thyroid function test in 3 months  with office visit. Side effects and precautions discussed with her.  -Patient is advised to maintain close follow up with Denyce Robert, FNP for primary care needs.     - Time spent on this patient care encounter:  20 minutes of which 50% was spent in  counseling and the rest reviewing  her current and  previous labs / studies and medications  doses and developing a plan for long term care. Charlotte Mccormick  participated in the discussions, expressed understanding, and voiced agreement with the above plans.  All questions were answered to her satisfaction. she is encouraged to contact clinic should she have any questions or concerns prior to her return visit.    Follow up plan: Return in about 2 weeks (around 05/15/2020)  for F/U with Biopsy Results.   Thank you for involving me in the care of this pleasant patient, and I will continue to update you with her progress.  Glade Lloyd, MD [facility] Phone: [phone]  Fax: [phone]   05/01/2020, 12:00 PM  This note was partially dictated with voice recognition software. Similar sounding words can be transcribed inadequately or may not  be corrected upon review.

## 2020-06-29 ENCOUNTER — Encounter: Payer: Self-pay | Admitting: Nurse Practitioner

## 2020-06-29 ENCOUNTER — Ambulatory Visit (INDEPENDENT_AMBULATORY_CARE_PROVIDER_SITE_OTHER): Payer: Medicare HMO | Admitting: Nurse Practitioner

## 2020-06-29 ENCOUNTER — Telehealth: Payer: Self-pay | Admitting: *Deleted

## 2020-06-29 VITALS — BP 127/67 | HR 96 | Temp 97.1°F | Ht 62.0 in | Wt 172.6 lb

## 2020-06-29 DIAGNOSIS — K5909 Other constipation: Secondary | ICD-10-CM

## 2020-06-29 DIAGNOSIS — K219 Gastro-esophageal reflux disease without esophagitis: Secondary | ICD-10-CM | POA: Diagnosis not present

## 2020-06-29 DIAGNOSIS — D649 Anemia, unspecified: Secondary | ICD-10-CM

## 2020-06-29 NOTE — Progress Notes (Signed)
Referring Provider: Denyce Robert, FNP Primary Care Physician:  Denyce Robert, FNP Primary GI:  Dr. Abbey Chatters  Chief Complaint  Patient presents with  . Gastroesophageal Reflux  . Constipation    HPI:   Charlotte Mccormick is a 35 y.o. female who presents for follow-up on GERD and constipation.  The patient was last seen in our office 11/02/2019 for the same.  Her last visit was a virtual office visit due to COVID-19/coronavirus pandemic.  Constipation generally well managed on MiraLAX and GERD symptoms previously not ideally controlled.  Previously not taking PPI.  For constipation she was previously on Linzess but had switched to MiraLAX during pregnancy and was recommended to restart Linzess at a previous visit.  At her last visit noted GERD symptoms not ideally controlled felt likely GERD/gastritis.  Began flaring up in early May, no dysphagia.  Has throat pain and lower abdominal crampy pain with a bowel movement every 2 to 3 weeks.  At some point she stopped taking Linzess and has been taking Mylanta which has not helped.  Had been on Prilosec and the emergency department added Pepcid to this.  Previously on omeprazole, pantoprazole, Pepcid.  Seeing endocrinology due to weight loss felt likely due to thyroid disease.  No other overt GI complaints.  Recommended trial of Dexilant daily, request progress report, schedule upper endoscopy, restart Linzess 72 mcg daily, follow-up in 4 months.  She called our office 11/08/2019 indicating Dexilant works well and a prescription was sent to her pharmacy.  EGD completed 01/17/2020 which found gastritis status post biopsy, normal duodenum, mild Schatzki's ring.  Surgical pathology found the biopsies to be mild nonspecific reactive gastropathy and gastric oxyntic mucosa with no specific histopathological changes.   Last colonoscopy 2017 with few small and large mouth diverticula, 2 sessile polyps measuring 2 to 3 mm in size.  No source identified  for heme positive stools.  Surgical pathology found the polyps to be hyperplastic and felt anemia likely due to menstrual cycle.  Recommended consider colonoscopy due to worsening anemia.  Continue Dexilant.  Most recent CBC 01/27/2020 found hemoglobin at 8.6, normal iron saturation and ferritin (47%/18).  Today she states she doing okay overall. Dexilant working well, symptoms improved; still cannot drink OJ which causes burning and bile. Constipation is much better on Linzess. Has regular soft bowel movements about 2-3 times a week, no straining. She states she's had persistent anemia. Denies obvious hematochezia, melena, fever, chills, unintentional weight loss. Denies further abdominal pain, N/V. Has recently been diagnosed with hyperthyroidism and has been started on medication for this. Denies URI or flu-like symptoms. Denies loss of sense of taste or smell.  The patient has received COVID-19 vaccination(s). Denies chest pain, dyspnea, dizziness, lightheadedness, syncope, near syncope. Denies any other upper or lower GI symptoms.  She did have COVID-19 several weeks ago.  She is not on iron because it's been causing constipation.   Past Medical History:  Diagnosis Date  . Abnormal uterine bleeding (AUB) 12/12/2015  . Anemia   . Anxiety   . Asthma   . Constipation   . Depression   . Fibroid   . Genital herpes   . GERD (gastroesophageal reflux disease)   . History of chlamydia   . Hyperemesis   . IBS (irritable bowel syndrome)   . Insomnia   . Mental disorder    PTSD, ANXIETY,Depression  . PTSD (post-traumatic stress disorder)   . Sinusitis   . Vaginal dryness 12/12/2015    Past  Surgical History:  Procedure Laterality Date  . COLONOSCOPY     2016  . COLONOSCOPY N/A 04/08/2016   Procedure: COLONOSCOPY;  Surgeon: Danie Binder, MD;  Location: [facility];  Service: Endoscopy;  Laterality: N/A;  10:15 AM  . ESOPHAGOGASTRODUODENOSCOPY N/A 04/08/2016   Procedure:  ESOPHAGOGASTRODUODENOSCOPY (EGD);  Surgeon: Danie Binder, MD;  Location: [facility];  Service: Endoscopy;  Laterality: N/A;  . ESOPHAGOGASTRODUODENOSCOPY (EGD) WITH PROPOFOL N/A 01/17/2020   Procedure: ESOPHAGOGASTRODUODENOSCOPY (EGD) WITH PROPOFOL;  Surgeon: [name], DO;  Location: [facility];  Service: Endoscopy;  Laterality: N/A;  8:15am  . PERINEUM REPAIR    . UPPER GASTROINTESTINAL ENDOSCOPY  2016    Current Outpatient Medications  Medication Sig Dispense Refill  . acetaminophen (TYLENOL) 500 MG tablet Take 500 mg by mouth every 6 (six) hours as needed for mild pain or moderate pain.    Marland Kitchen albuterol (VENTOLIN HFA) 108 (90 Base) MCG/ACT inhaler Inhale 1 puff into the lungs every 6 (six) hours as needed for wheezing or shortness of breath.    Marland Kitchen buPROPion (WELLBUTRIN XL) 300 MG 24 hr tablet Take 1 tablet (300 mg total) by mouth daily. 30 tablet 2  . dexlansoprazole (DEXILANT) 60 MG capsule Take 1 capsule (60 mg total) by mouth daily. 90 capsule 3  . [START ON 07/02/2020] diazepam (VALIUM) 5 MG tablet Take 1 tablet (5 mg total) by mouth 3 (three) times daily as needed for anxiety. for anxiety 90 tablet 0  . [START ON 07/17/2020] escitalopram (LEXAPRO) 10 MG tablet Take 1 tablet (10 mg total) by mouth daily. 30 tablet 2  . linaclotide (LINZESS) 72 MCG capsule Take 1 capsule (72 mcg total) by mouth daily before breakfast. 30 capsule 5  . meloxicam (MOBIC) 15 MG tablet Take 15 mg by mouth daily. As needed    . methimazole (TAPAZOLE) 5 MG tablet Take 1 tablet (5 mg total) by mouth daily. 30 tablet 3  . ondansetron (ZOFRAN ODT) 4 MG disintegrating tablet Take 1 tablet (4 mg total) by mouth every 8 (eight) hours as needed. 20 tablet 0  . prazosin (MINIPRESS) 5 MG capsule Take 1 capsule (5 mg total) by mouth at bedtime. 30 capsule 2  . rizatriptan (MAXALT) 10 MG tablet Take 1 tablet by mouth daily as needed. At onset of headache    . VITAMIN D PO Take 1 tablet by mouth daily.     No  current facility-administered medications for this visit.    Allergies as of 06/29/2020 - Review Complete 06/29/2020  Allergen Reaction Noted  . Bee venom Swelling and Other (See Comments) 04/12/2013  . Hydrocodone Itching 01/24/2020  . Penicillins Itching and Other (See Comments) 04/02/2016  . Reglan [metoclopramide] Anxiety 07/22/2016    Family History  Problem Relation Age of Onset  . Heart disease Mother   . Hypertension Mother   . Hyperlipidemia Mother   . Heart attack Mother   . Heart failure Mother   . Hypertension Father   . Hyperlipidemia Father   . Diabetes Father   . Cancer Father   . Thyroid disease Father   . Stroke Father   . Epilepsy Brother   . Eczema Daughter   . Autism spectrum disorder Son   . Colon cancer Neg Hx     Social History   Socioeconomic History  . Marital status: Married    Spouse name: Not on file  . Number of children: 3  . Years of education: Not on  file  . Highest education level: Not on file  Occupational History  . Not on file  Tobacco Use  . Smoking status: Former Smoker    Packs/day: 0.50    Years: 5.00    Pack years: 2.50    Types: Cigarettes    Quit date: 06/03/2010    Years since quitting: 10.0  . Smokeless tobacco: Never Used  Vaping Use  . Vaping Use: Never used  Substance and Sexual Activity  . Alcohol use: Not Currently    Alcohol/week: 2.0 standard drinks    Types: 2 Glasses of wine per week    Comment: occ wine; denied 11/02/19  . Drug use: Yes    Types: Marijuana    Comment: 11/02/19 states she used gummies 1-2 times a month.  . Sexual activity: Yes    Birth control/protection: Pill  Other Topics Concern  . Not on file  Social History Narrative   ** Merged History Encounter **       Social Determinants of Health   Financial Resource Strain: Not on file  Food Insecurity: Not on file  Transportation Needs: Not on file  Physical Activity: Not on file  Stress: Not on file  Social Connections: Not on file     Subjective: Review of Systems  Constitutional: Negative for chills, fever, malaise/fatigue and weight loss.  HENT: Negative for congestion and sore throat.   Respiratory: Negative for cough and shortness of breath.   Cardiovascular: Negative for chest pain and palpitations.  Gastrointestinal: Negative for abdominal pain, blood in stool, constipation, diarrhea, heartburn, melena, nausea and vomiting.  Musculoskeletal: Negative for joint pain and myalgias.  Skin: Negative for rash.  Neurological: Negative for dizziness and weakness.  Endo/Heme/Allergies: Does not bruise/bleed easily.  Psychiatric/Behavioral: Negative for depression. The patient is not nervous/anxious.   All other systems reviewed and are negative.    Objective: BP 127/67   Pulse 96   Temp (!) 97.1 F (36.2 C) (Temporal)   Ht _0  (1.575 m)   Wt 172 lb 9.6 oz (78.3 kg)   LMP 06/12/2020 (Approximate)   BMI 31.57 kg/m  Physical Exam Vitals and nursing note reviewed.  Constitutional:      General: She is not in acute distress.    Appearance: Normal appearance. She is well-developed. She is obese. She is not ill-appearing, toxic-appearing or diaphoretic.  HENT:     Head: Normocephalic and atraumatic.     Nose: No congestion or rhinorrhea.  Eyes:     General: No scleral icterus. Cardiovascular:     Rate and Rhythm: Normal rate and regular rhythm.     Heart sounds: Normal heart sounds.  Pulmonary:     Effort: Pulmonary effort is normal. No respiratory distress.     Breath sounds: Normal breath sounds.  Abdominal:     General: Bowel sounds are normal.     Palpations: Abdomen is soft. There is no hepatomegaly, splenomegaly or mass.     Tenderness: There is no abdominal tenderness. There is no guarding or rebound.     Hernia: No hernia is present.  Skin:    General: Skin is warm and dry.     Coloration: Skin is not jaundiced.     Findings: No rash.  Neurological:     General: No focal deficit present.      Mental Status: She is alert and oriented to person, place, and time.  Psychiatric:        Attention and Perception: Attention normal.  Mood and Affect: Mood normal.        Speech: Speech normal.        Behavior: Behavior normal.        Thought Content: Thought content normal.        Cognition and Memory: Cognition and memory normal.      Assessment:  Pleasant 35 year old female presents for follow-up on GERD and constipation.  Also noted persistent anemia for which an updated colonoscopy as previously recommended.  No other red flag/warning signs or symptoms.  GERD: Improved on Dexilant, still cannot drink acidic foods such as orange juice.  Discussed general GERD prevention including trigger avoidance.  Recommend she continue her current medications.  Constipation: Significantly improved on Linzess now having regular soft bowel movements 2-3 times a week and no straining.  Medications seem to be effective and so I will have her continue her current dosage.  Persistent anemia: Most recent CBC 01/27/2020 with a hemoglobin of 8.6 but normal iron saturation and ferritin.  Previously recommended consider colonoscopy due to worsening and persistent anemia.  She was previously recommended oral iron but is not on this because it caused worsening of her constipation.  No significant or overt symptoms of symptomatic anemia.  At this point I will check CBC and ferritin.  I will also schedule her for colonoscopy, which she is amenable to.  Further recommendations to follow.  Proceed with TCS on propofol/MAC with Dr. Abbey Chatters on propofol/MAC in near future: the risks, benefits, and alternatives have been discussed with the patient in detail. The patient states understanding and desires to proceed.  ASA II   Plan: 1. CBC, Ferritin 2. Colonoscopy as described above 3. Follow-up in 3 months    Thank you for allowing [place] to participate in the care of [place], DNP,  AGNP-C Adult & Gerontological Nurse Practitioner [facility]   06/29/2020 10:24 AM   Disclaimer: This note was dictated with voice recognition software. Similar sounding words can inadvertently be transcribed and may not be corrected upon review.

## 2020-06-29 NOTE — Patient Instructions (Signed)
Your health issues we discussed today were:   GERD: 1. I am glad you are GERD currently doing well on Dexilant 2. Continue taking Dexilant 3. Call for any worsening or severe symptoms  Constipation: 1. I am glad your constipation is improved on Linzess 2. Continue to take Linzess as you have been 3. Call us for any worsening or severe symptoms 4. If you need to restart iron and it causes worsening constipation, let us know and we can try to increase your dose of Linzess to help counteract the iron  Worsening anemia: 1. Have your labs drawn when you are able to 2. As we discussed, your EGD did not show obvious source of bleeding.  We should proceed with a colonoscopy to check for any sources there 3. We may eventually evaluate your small bowel with a pill/capsule study.  We can discuss this in the future depending on results your colonoscopy 4. Further recommendations will follow your colonoscopy  Overall I recommend:  1. Continue other current medications 2. Return for follow-up in 3 months 3. Call if you have any questions or concerns   ---------------------------------------------------------------  I am glad you have gotten your COVID-19 vaccination!  Even though you are fully vaccinated you should continue to follow CDC and state/local guidelines.  ---------------------------------------------------------------   At [facility] we value your feedback. You may receive a survey about your visit today. Please share your experience as we strive to create trusting relationships with our patients to provide genuine, compassionate, quality care.  We appreciate your understanding and patience as we review any laboratory studies, imaging, and other diagnostic tests that are ordered as we care for you. Our office policy is 5 business days for review of these results, and any emergent or urgent results are addressed in a timely manner for your best interest. If you do not  hear from our office in 1 week, please contact us.   We also encourage the use of MyChart, which contains your medical information for your review as well. If you are not enrolled in this feature, an access code is on this after visit summary for your convenience. Thank you for allowing [place] to be involved in your care.  It was great to see you today!  I hope you have a safe and warm winter!!

## 2020-06-29 NOTE — Telephone Encounter (Signed)
Called pt. She has been scheduled for TCS with Dr. Abbey Chatters, ASA 2 on 2/21 at 11:00am. Aware will mail instructions with covid test appt. Confirmed mailing address.   PA submitted via [employer] for TCS. Pending review tracking#: [id number]

## 2020-06-29 NOTE — H&P (View-Only) (Signed)
Referring Provider: Denyce Robert, FNP Primary Care Physician:  Denyce Robert, FNP Primary GI:  Dr. Abbey Chatters  Chief Complaint  Patient presents with  . Gastroesophageal Reflux  . Constipation    HPI:   Charlotte Mccormick is a 35 y.o. female who presents for follow-up on GERD and constipation.  The patient was last seen in our office 11/02/2019 for the same.  Her last visit was a virtual office visit due to COVID-19/coronavirus pandemic.  Constipation generally well managed on MiraLAX and GERD symptoms previously not ideally controlled.  Previously not taking PPI.  For constipation she was previously on Linzess but had switched to MiraLAX during pregnancy and was recommended to restart Linzess at a previous visit.  At her last visit noted GERD symptoms not ideally controlled felt likely GERD/gastritis.  Began flaring up in early May, no dysphagia.  Has throat pain and lower abdominal crampy pain with a bowel movement every 2 to 3 weeks.  At some point she stopped taking Linzess and has been taking Mylanta which has not helped.  Had been on Prilosec and the emergency department added Pepcid to this.  Previously on omeprazole, pantoprazole, Pepcid.  Seeing endocrinology due to weight loss felt likely due to thyroid disease.  No other overt GI complaints.  Recommended trial of Dexilant daily, request progress report, schedule upper endoscopy, restart Linzess 72 mcg daily, follow-up in 4 months.  She called our office 11/08/2019 indicating Dexilant works well and a prescription was sent to her pharmacy.  EGD completed 01/17/2020 which found gastritis status post biopsy, normal duodenum, mild Schatzki's ring.  Surgical pathology found the biopsies to be mild nonspecific reactive gastropathy and gastric oxyntic mucosa with no specific histopathological changes.   Last colonoscopy 2017 with few small and large mouth diverticula, 2 sessile polyps measuring 2 to 3 mm in size.  No source identified  for heme positive stools.  Surgical pathology found the polyps to be hyperplastic and felt anemia likely due to menstrual cycle.  Recommended consider colonoscopy due to worsening anemia.  Continue Dexilant.  Most recent CBC 01/27/2020 found hemoglobin at 8.6, normal iron saturation and ferritin (47%/18).  Today she states she doing okay overall. Dexilant working well, symptoms improved; still cannot drink OJ which causes burning and bile. Constipation is much better on Linzess. Has regular soft bowel movements about 2-3 times a week, no straining. She states she's had persistent anemia. Denies obvious hematochezia, melena, fever, chills, unintentional weight loss. Denies further abdominal pain, N/V. Has recently been diagnosed with hyperthyroidism and has been started on medication for this. Denies URI or flu-like symptoms. Denies loss of sense of taste or smell.  The patient has received COVID-19 vaccination(s). Denies chest pain, dyspnea, dizziness, lightheadedness, syncope, near syncope. Denies any other upper or lower GI symptoms.  She did have COVID-19 several weeks ago.  She is not on iron because it's been causing constipation.   Past Medical History:  Diagnosis Date  . Abnormal uterine bleeding (AUB) 12/12/2015  . Anemia   . Anxiety   . Asthma   . Constipation   . Depression   . Fibroid   . Genital herpes   . GERD (gastroesophageal reflux disease)   . History of chlamydia   . Hyperemesis   . IBS (irritable bowel syndrome)   . Insomnia   . Mental disorder    PTSD, ANXIETY,Depression  . PTSD (post-traumatic stress disorder)   . Sinusitis   . Vaginal dryness 12/12/2015    Past  Surgical History:  Procedure Laterality Date  . COLONOSCOPY     2016  . COLONOSCOPY N/A 04/08/2016   Procedure: COLONOSCOPY;  Surgeon: Danie Binder, MD;  Location: [facility];  Service: Endoscopy;  Laterality: N/A;  10:15 AM  . ESOPHAGOGASTRODUODENOSCOPY N/A 04/08/2016   Procedure:  ESOPHAGOGASTRODUODENOSCOPY (EGD);  Surgeon: Danie Binder, MD;  Location: [facility];  Service: Endoscopy;  Laterality: N/A;  . ESOPHAGOGASTRODUODENOSCOPY (EGD) WITH PROPOFOL N/A 01/17/2020   Procedure: ESOPHAGOGASTRODUODENOSCOPY (EGD) WITH PROPOFOL;  Surgeon: [name], DO;  Location: [facility];  Service: Endoscopy;  Laterality: N/A;  8:15am  . PERINEUM REPAIR    . UPPER GASTROINTESTINAL ENDOSCOPY  2016    Current Outpatient Medications  Medication Sig Dispense Refill  . acetaminophen (TYLENOL) 500 MG tablet Take 500 mg by mouth every 6 (six) hours as needed for mild pain or moderate pain.    Marland Kitchen albuterol (VENTOLIN HFA) 108 (90 Base) MCG/ACT inhaler Inhale 1 puff into the lungs every 6 (six) hours as needed for wheezing or shortness of breath.    Marland Kitchen buPROPion (WELLBUTRIN XL) 300 MG 24 hr tablet Take 1 tablet (300 mg total) by mouth daily. 30 tablet 2  . dexlansoprazole (DEXILANT) 60 MG capsule Take 1 capsule (60 mg total) by mouth daily. 90 capsule 3  . [START ON 07/02/2020] diazepam (VALIUM) 5 MG tablet Take 1 tablet (5 mg total) by mouth 3 (three) times daily as needed for anxiety. for anxiety 90 tablet 0  . [START ON 07/17/2020] escitalopram (LEXAPRO) 10 MG tablet Take 1 tablet (10 mg total) by mouth daily. 30 tablet 2  . linaclotide (LINZESS) 72 MCG capsule Take 1 capsule (72 mcg total) by mouth daily before breakfast. 30 capsule 5  . meloxicam (MOBIC) 15 MG tablet Take 15 mg by mouth daily. As needed    . methimazole (TAPAZOLE) 5 MG tablet Take 1 tablet (5 mg total) by mouth daily. 30 tablet 3  . ondansetron (ZOFRAN ODT) 4 MG disintegrating tablet Take 1 tablet (4 mg total) by mouth every 8 (eight) hours as needed. 20 tablet 0  . prazosin (MINIPRESS) 5 MG capsule Take 1 capsule (5 mg total) by mouth at bedtime. 30 capsule 2  . rizatriptan (MAXALT) 10 MG tablet Take 1 tablet by mouth daily as needed. At onset of headache    . VITAMIN D PO Take 1 tablet by mouth daily.     No  current facility-administered medications for this visit.    Allergies as of 06/29/2020 - Review Complete 06/29/2020  Allergen Reaction Noted  . Bee venom Swelling and Other (See Comments) 04/12/2013  . Hydrocodone Itching 01/24/2020  . Penicillins Itching and Other (See Comments) 04/02/2016  . Reglan [metoclopramide] Anxiety 07/22/2016    Family History  Problem Relation Age of Onset  . Heart disease Mother   . Hypertension Mother   . Hyperlipidemia Mother   . Heart attack Mother   . Heart failure Mother   . Hypertension Father   . Hyperlipidemia Father   . Diabetes Father   . Cancer Father   . Thyroid disease Father   . Stroke Father   . Epilepsy Brother   . Eczema Daughter   . Autism spectrum disorder Son   . Colon cancer Neg Hx     Social History   Socioeconomic History  . Marital status: Married    Spouse name: Not on file  . Number of children: 3  . Years of education: Not on  file  . Highest education level: Not on file  Occupational History  . Not on file  Tobacco Use  . Smoking status: Former Smoker    Packs/day: 0.50    Years: 5.00    Pack years: 2.50    Types: Cigarettes    Quit date: 06/03/2010    Years since quitting: 10.0  . Smokeless tobacco: Never Used  Vaping Use  . Vaping Use: Never used  Substance and Sexual Activity  . Alcohol use: Not Currently    Alcohol/week: 2.0 standard drinks    Types: 2 Glasses of wine per week    Comment: occ wine; denied 11/02/19  . Drug use: Yes    Types: Marijuana    Comment: 11/02/19 states she used gummies 1-2 times a month.  . Sexual activity: Yes    Birth control/protection: Pill  Other Topics Concern  . Not on file  Social History Narrative   ** Merged History Encounter **       Social Determinants of Health   Financial Resource Strain: Not on file  Food Insecurity: Not on file  Transportation Needs: Not on file  Physical Activity: Not on file  Stress: Not on file  Social Connections: Not on file     Subjective: Review of Systems  Constitutional: Negative for chills, fever, malaise/fatigue and weight loss.  HENT: Negative for congestion and sore throat.   Respiratory: Negative for cough and shortness of breath.   Cardiovascular: Negative for chest pain and palpitations.  Gastrointestinal: Negative for abdominal pain, blood in stool, constipation, diarrhea, heartburn, melena, nausea and vomiting.  Musculoskeletal: Negative for joint pain and myalgias.  Skin: Negative for rash.  Neurological: Negative for dizziness and weakness.  Endo/Heme/Allergies: Does not bruise/bleed easily.  Psychiatric/Behavioral: Negative for depression. The patient is not nervous/anxious.   All other systems reviewed and are negative.    Objective: BP 127/67   Pulse 96   Temp (!) 97.1 F (36.2 C) (Temporal)   Ht _0  (1.575 m)   Wt 172 lb 9.6 oz (78.3 kg)   LMP 06/12/2020 (Approximate)   BMI 31.57 kg/m  Physical Exam Vitals and nursing note reviewed.  Constitutional:      General: She is not in acute distress.    Appearance: Normal appearance. She is well-developed. She is obese. She is not ill-appearing, toxic-appearing or diaphoretic.  HENT:     Head: Normocephalic and atraumatic.     Nose: No congestion or rhinorrhea.  Eyes:     General: No scleral icterus. Cardiovascular:     Rate and Rhythm: Normal rate and regular rhythm.     Heart sounds: Normal heart sounds.  Pulmonary:     Effort: Pulmonary effort is normal. No respiratory distress.     Breath sounds: Normal breath sounds.  Abdominal:     General: Bowel sounds are normal.     Palpations: Abdomen is soft. There is no hepatomegaly, splenomegaly or mass.     Tenderness: There is no abdominal tenderness. There is no guarding or rebound.     Hernia: No hernia is present.  Skin:    General: Skin is warm and dry.     Coloration: Skin is not jaundiced.     Findings: No rash.  Neurological:     General: No focal deficit present.      Mental Status: She is alert and oriented to person, place, and time.  Psychiatric:        Attention and Perception: Attention normal.  Mood and Affect: Mood normal.        Speech: Speech normal.        Behavior: Behavior normal.        Thought Content: Thought content normal.        Cognition and Memory: Cognition and memory normal.      Assessment:  Pleasant 35 year old female presents for follow-up on GERD and constipation.  Also noted persistent anemia for which an updated colonoscopy as previously recommended.  No other red flag/warning signs or symptoms.  GERD: Improved on Dexilant, still cannot drink acidic foods such as orange juice.  Discussed general GERD prevention including trigger avoidance.  Recommend she continue her current medications.  Constipation: Significantly improved on Linzess now having regular soft bowel movements 2-3 times a week and no straining.  Medications seem to be effective and so I will have her continue her current dosage.  Persistent anemia: Most recent CBC 01/27/2020 with a hemoglobin of 8.6 but normal iron saturation and ferritin.  Previously recommended consider colonoscopy due to worsening and persistent anemia.  She was previously recommended oral iron but is not on this because it caused worsening of her constipation.  No significant or overt symptoms of symptomatic anemia.  At this point I will check CBC and ferritin.  I will also schedule her for colonoscopy, which she is amenable to.  Further recommendations to follow.  Proceed with TCS on propofol/MAC with Dr. Abbey Chatters on propofol/MAC in near future: the risks, benefits, and alternatives have been discussed with the patient in detail. The patient states understanding and desires to proceed.  ASA II   Plan: 1. CBC, Ferritin 2. Colonoscopy as described above 3. Follow-up in 3 months    Thank you for allowing [place] to participate in the care of [place], DNP,  AGNP-C Adult & Gerontological Nurse Practitioner [facility]   06/29/2020 10:24 AM   Disclaimer: This note was dictated with voice recognition software. Similar sounding words can inadvertently be transcribed and may not be corrected upon review.

## 2020-06-29 NOTE — Telephone Encounter (Signed)
PA approved. Auth# [id number] DOS 07/24/2020-08/23/2020

## 2020-07-01 IMAGING — US US MFM FETAL BPP W/O NON-STRESS
1 series · 15 of 28 positions shown · non-contrast
Comparison: none

[Series 1: us mfm fetal bpp w/o non-stress · 29 acquisitions, 15 frames shown]
[im 1/29]
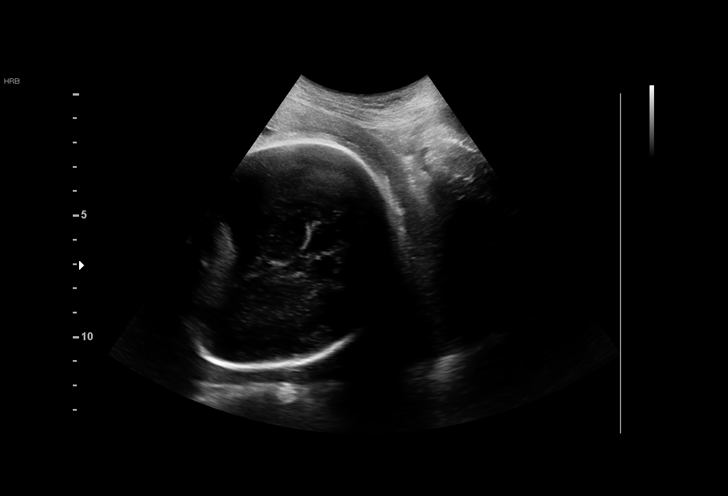
[im 3/29]
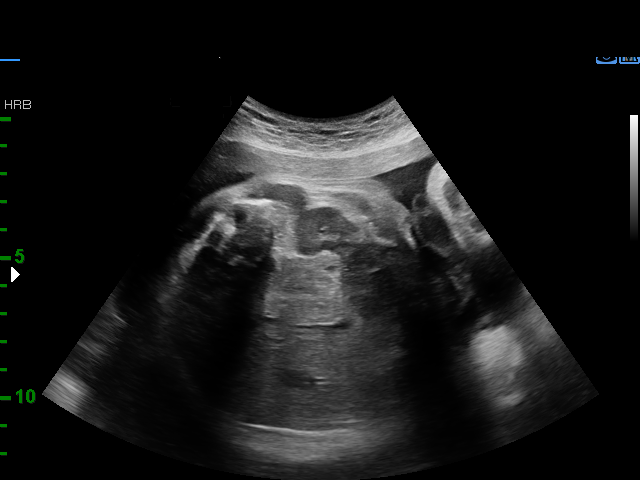
[im 5/29]
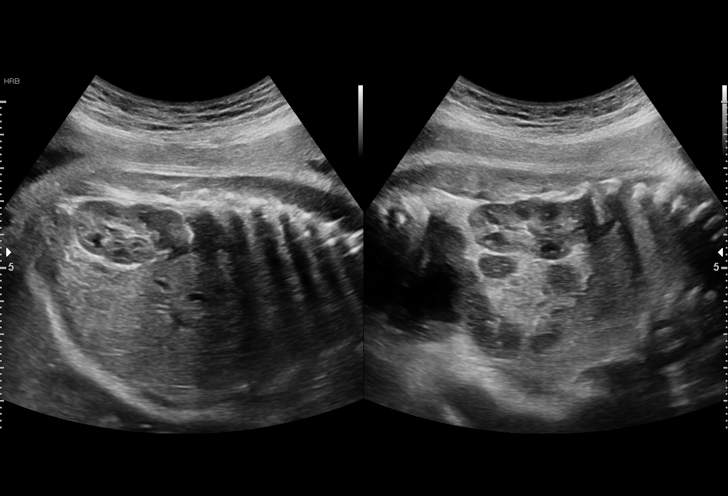
[im 7/29]
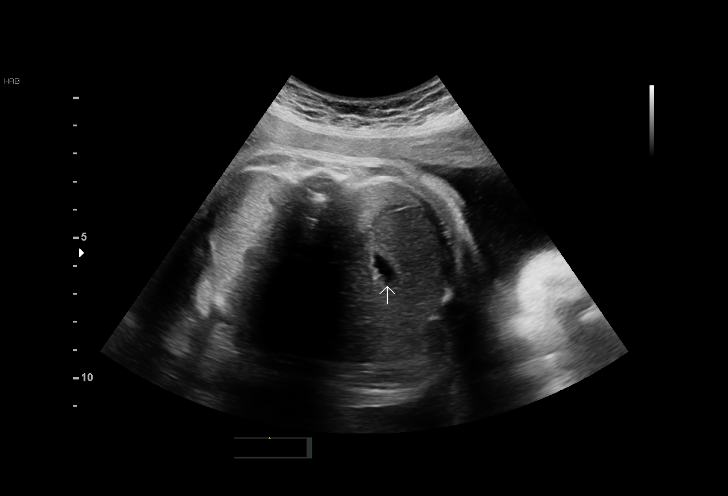
[im 9/29]
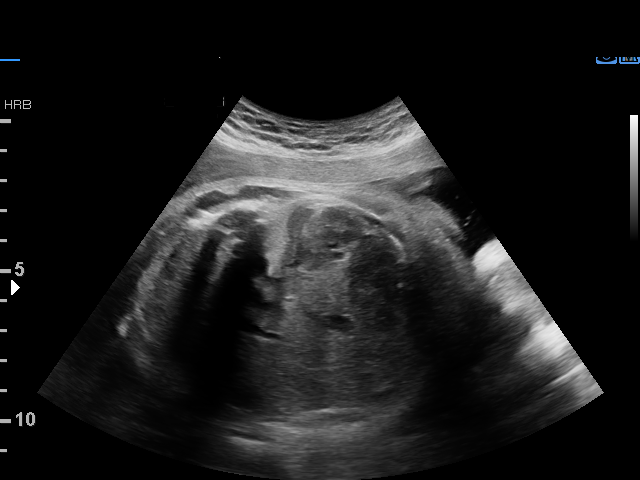
[im 11/29]
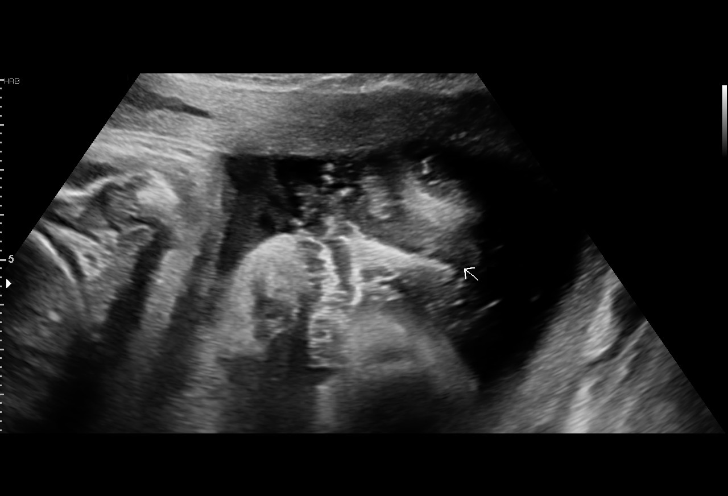
[im 13/29]
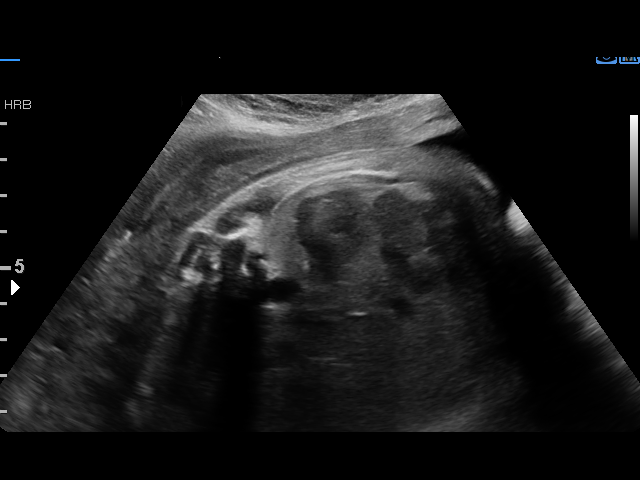
[im 15/29]
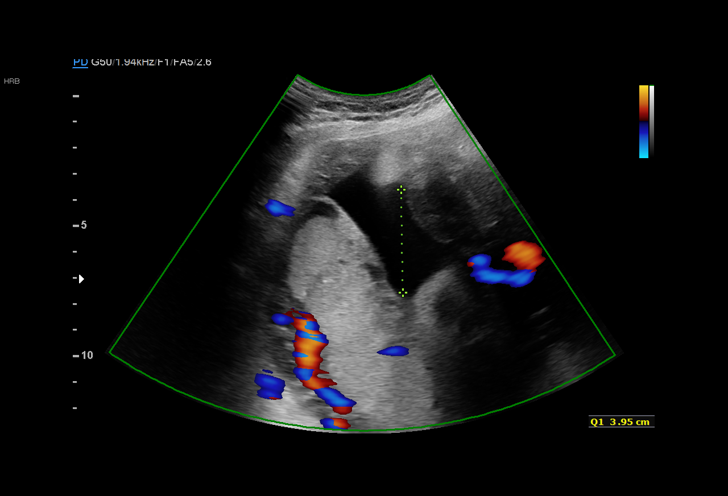
[im 16/29]
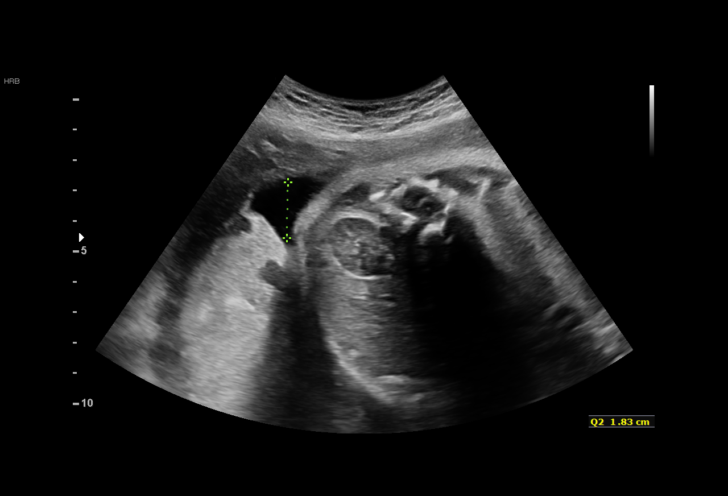
[im 18/29]
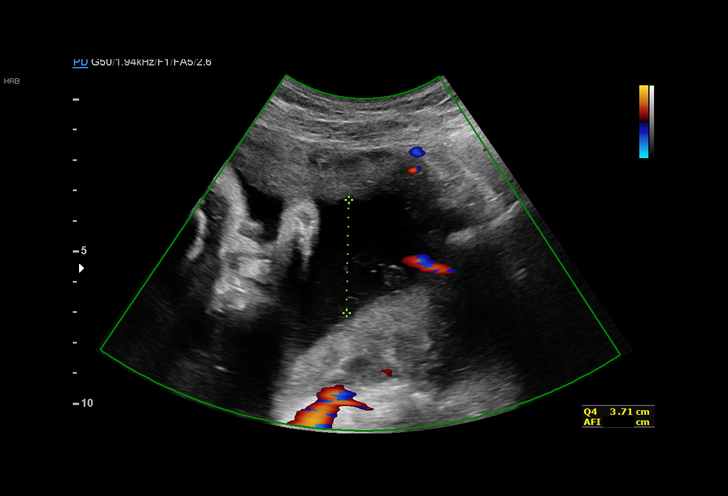
[im 20/29]
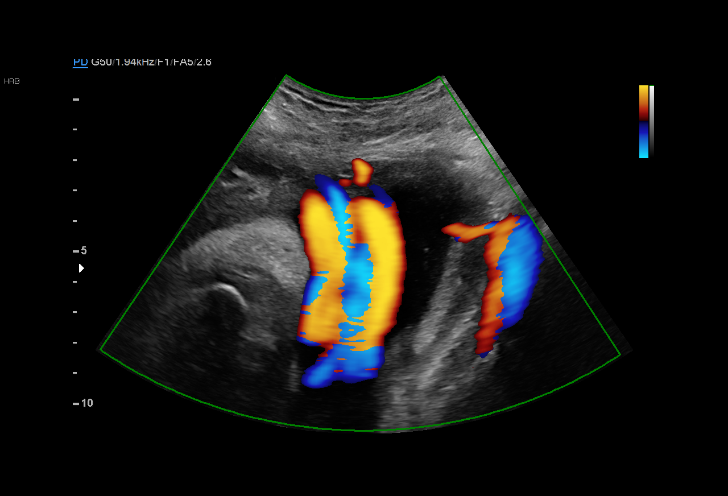
[im 22/29]
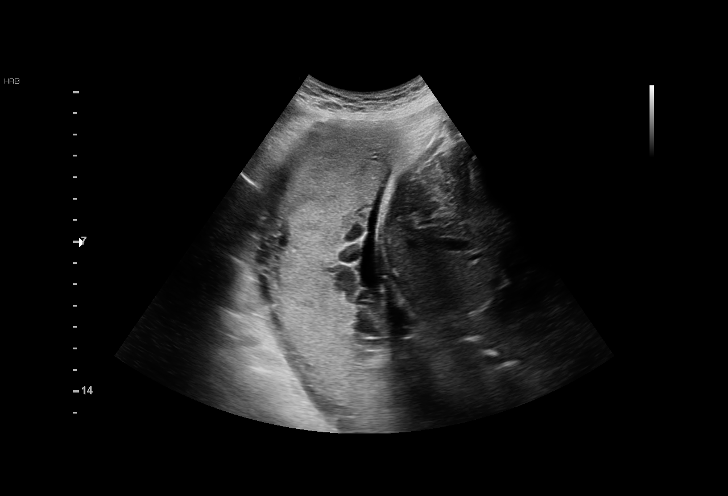
[im 24/29]
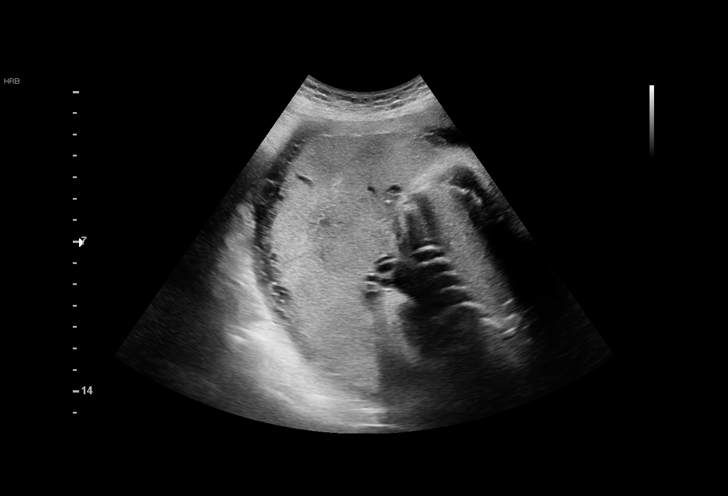
[im 26/29]
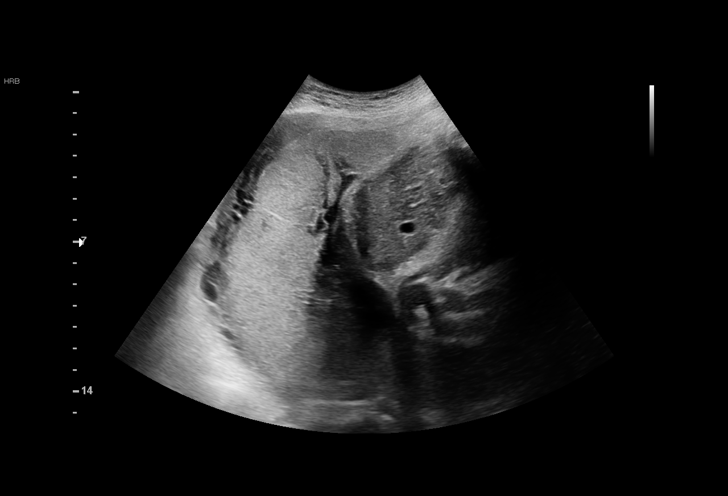
[im 29/29]
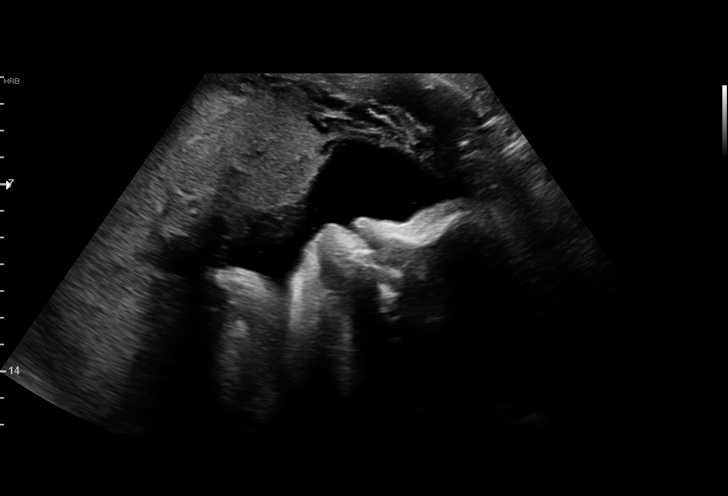

[15 of 28 positions shown; findings below may reference images not displayed]

LANIER

Indications

35 weeks gestation of pregnancy
Decreased fetal movement
Vital Signs

BMI:
Fetal Evaluation

Num Of Fetuses:         1
Fetal Heart Rate(bpm):  158
Cardiac Activity:       Observed
Presentation:           Cephalic

Amniotic Fluid
AFI FV:      Within normal limits

AFI Sum(cm)     %Tile       Largest Pocket(cm)
13.81           50

RUQ(cm)       RLQ(cm)       LUQ(cm)        LLQ(cm)
3.95
Biophysical Evaluation

Amniotic F.V:   Within normal limits       F. Tone:        Observed
F. Movement:    Observed                   Score:          [DATE]
F. Breathing:   Observed
OB History

Gravidity:    3         Term:   2
Living:       2
Gestational Age

LMP:           35w 5d        Date:  06/14/17                 EDD:   03/21/18
Best:          35w 5d     Det. By:  LMP  (06/14/17)          EDD:   03/21/18
Anatomy

Thoracic:              Appears normal         Kidneys:                Appear normal
Stomach:               Appears normal, left   Bladder:                Appears normal
sided
Abdomen:               Appears normal
Cervix Uterus Adnexa

Cervix
Not visualized (advanced GA >21wks)
Impression

Patient is admitted after a BPP score of [DATE]. She had
nausea and vomiting and received IV hydration. Her
symptoms have improved. She reports good fetal movements.
Amniotic fluid is normal and good fetal activity is seen.
Antenatal testing is reassuring. BPP [DATE].
NST performed in the antepartum unit has been reassuring.
Recommendations

Consider discharge.

## 2020-07-03 ENCOUNTER — Telehealth: Payer: Self-pay | Admitting: "Endocrinology

## 2020-07-03 NOTE — Telephone Encounter (Signed)
She can break the pill in half and take 2.5 mg every other day until her body gets used to it.

## 2020-07-03 NOTE — Telephone Encounter (Signed)
Called patient and advised, verbalized understanding 

## 2020-07-03 NOTE — Telephone Encounter (Signed)
Please advise 

## 2020-07-03 NOTE — Telephone Encounter (Signed)
Pt called and states Dr Dorris Fetch just started her on a medication that is causing muscle aches and headaches. She would like to know what she can do to prevent this.  methimazole (TAPAZOLE) 5 MG tablet

## 2020-07-13 LAB — FERRITIN

## 2020-07-13 LAB — CBC WITH DIFFERENTIAL/PLATELET

## 2020-07-19 ENCOUNTER — Telehealth: Payer: Self-pay | Admitting: Nurse Practitioner

## 2020-07-19 NOTE — Telephone Encounter (Signed)
Phoned the pt and LM on her vm to return call regarding blood-work.

## 2020-07-19 NOTE — Telephone Encounter (Signed)
Please call the patient and ask if she ever went to have her labs drawn. A result came back looking like they didn't get a blood sample. I'm trying to figure out if they messed up the tube or if they did that to 'cancel" the test because she never went.  Let me know, thanks!

## 2020-07-20 ENCOUNTER — Other Ambulatory Visit: Payer: Self-pay | Admitting: Nurse Practitioner

## 2020-07-20 ENCOUNTER — Telehealth: Payer: Self-pay | Admitting: Internal Medicine

## 2020-07-20 NOTE — Telephone Encounter (Signed)
The pt returned the call and advised me that she went but [facility] had a hard time trying to get the blood. She advised she is going back to [facility] in the morning because their is a particular person there who can get it. "states they have a hard time getting blood from here". She assured me she is going in the morning.

## 2020-07-20 NOTE — Telephone Encounter (Signed)
Patient called and wanted the nurse to know that she had her labs done today.

## 2020-07-20 NOTE — Telephone Encounter (Signed)
noted 

## 2020-07-21 ENCOUNTER — Other Ambulatory Visit (HOSPITAL_COMMUNITY)
Admission: RE | Admit: 2020-07-21 | Discharge: 2020-07-21 | Disposition: A | Discharge status: Home / Self Care | Payer: Medicare HMO | Source: Ambulatory Visit | Attending: Internal Medicine | Admitting: Internal Medicine

## 2020-07-21 ENCOUNTER — Encounter (HOSPITAL_COMMUNITY): Payer: Self-pay

## 2020-07-21 ENCOUNTER — Other Ambulatory Visit: Payer: Self-pay

## 2020-07-21 DIAGNOSIS — Z20822 Contact with and (suspected) exposure to covid-19: Secondary | ICD-10-CM | POA: Diagnosis not present

## 2020-07-21 DIAGNOSIS — Z01812 Encounter for preprocedural laboratory examination: Secondary | ICD-10-CM | POA: Diagnosis present

## 2020-07-21 LAB — CBC WITH DIFFERENTIAL/PLATELET
Basophils Absolute: 0 10*3/uL (ref 0.0–0.2)
Basos: 1 %
EOS (ABSOLUTE): 0 10*3/uL (ref 0.0–0.4)
Eos: 1 %
Hematocrit: 30.2 % — ABNORMAL LOW (ref 34.0–46.6)
Hemoglobin: 8.6 g/dL — ABNORMAL LOW (ref 11.1–15.9)
Immature Grans (Abs): 0 10*3/uL (ref 0.0–0.1)
Immature Granulocytes: 0 %
Lymphocytes Absolute: 1.4 10*3/uL (ref 0.7–3.1)
Lymphs: 43 %
MCH: 18.9 pg — ABNORMAL LOW (ref 26.6–33.0)
MCHC: 28.5 g/dL — ABNORMAL LOW (ref 31.5–35.7)
MCV: 66 fL — ABNORMAL LOW (ref 79–97)
Monocytes Absolute: 0.4 10*3/uL (ref 0.1–0.9)
Monocytes: 12 %
Neutrophils Absolute: 1.4 10*3/uL (ref 1.4–7.0)
Neutrophils: 43 %
Platelets: 255 10*3/uL (ref 150–450)
RBC: 4.55 x10E6/uL (ref 3.77–5.28)
RDW: 16.9 % — ABNORMAL HIGH (ref 11.7–15.4)
WBC: 3.3 10*3/uL — ABNORMAL LOW (ref 3.4–10.8)

## 2020-07-21 LAB — FERRITIN: Ferritin: 5 ng/mL — ABNORMAL LOW (ref 15–150)

## 2020-07-21 LAB — T3, FREE: T3, Free: 4.8 pg/mL — ABNORMAL HIGH (ref 2.0–4.4)

## 2020-07-21 LAB — T4, FREE: Free T4: 1.18 ng/dL (ref 0.82–1.77)

## 2020-07-21 LAB — SARS CORONAVIRUS 2 (TAT 6-24 HRS): SARS Coronavirus 2: NEGATIVE

## 2020-07-21 LAB — TSH: TSH: 0.13 u[IU]/mL — ABNORMAL LOW (ref 0.450–4.500)

## 2020-07-21 NOTE — Telephone Encounter (Signed)
I don't see an admission or ER visit in her chart,. The only "admission" I see if the placeholder for the upcoming colonoscopy.

## 2020-07-21 NOTE — Telephone Encounter (Signed)
I just seen where this pt is at the hospital. She is sick i'm guessing because of her test. I will contact her next week to follow-up

## 2020-07-21 NOTE — Telephone Encounter (Signed)
It appears they cancelled the initial order. If she has issues at [facility], can reoder CBC and Ferritin (per orders in early February/last office visit).

## 2020-07-24 ENCOUNTER — Ambulatory Visit (HOSPITAL_COMMUNITY): Payer: Medicare HMO | Admitting: Anesthesiology

## 2020-07-24 ENCOUNTER — Encounter (HOSPITAL_COMMUNITY): Payer: Self-pay | Admitting: *Deleted

## 2020-07-24 ENCOUNTER — Ambulatory Visit (HOSPITAL_COMMUNITY)
Admission: RE | Admit: 2020-07-24 | Discharge: 2020-07-24 | Disposition: A | Discharge status: Home / Self Care | Payer: Medicare HMO | Attending: Internal Medicine | Admitting: Internal Medicine

## 2020-07-24 ENCOUNTER — Other Ambulatory Visit: Payer: Self-pay

## 2020-07-24 ENCOUNTER — Encounter (HOSPITAL_COMMUNITY)
Admission: RE | Disposition: A | Discharge status: Home / Self Care | Payer: Self-pay | Source: Home / Self Care | Attending: Internal Medicine

## 2020-07-24 DIAGNOSIS — E0590 Thyrotoxicosis, unspecified without thyrotoxic crisis or storm: Secondary | ICD-10-CM | POA: Diagnosis not present

## 2020-07-24 DIAGNOSIS — Z888 Allergy status to other drugs, medicaments and biological substances status: Secondary | ICD-10-CM | POA: Diagnosis not present

## 2020-07-24 DIAGNOSIS — Z79899 Other long term (current) drug therapy: Secondary | ICD-10-CM | POA: Insufficient documentation

## 2020-07-24 DIAGNOSIS — K648 Other hemorrhoids: Secondary | ICD-10-CM | POA: Insufficient documentation

## 2020-07-24 DIAGNOSIS — Z880 Allergy status to penicillin: Secondary | ICD-10-CM | POA: Insufficient documentation

## 2020-07-24 DIAGNOSIS — K219 Gastro-esophageal reflux disease without esophagitis: Secondary | ICD-10-CM | POA: Diagnosis not present

## 2020-07-24 DIAGNOSIS — Z87891 Personal history of nicotine dependence: Secondary | ICD-10-CM | POA: Insufficient documentation

## 2020-07-24 DIAGNOSIS — Z885 Allergy status to narcotic agent status: Secondary | ICD-10-CM | POA: Diagnosis not present

## 2020-07-24 DIAGNOSIS — K5730 Diverticulosis of large intestine without perforation or abscess without bleeding: Secondary | ICD-10-CM | POA: Diagnosis not present

## 2020-07-24 DIAGNOSIS — Z91030 Bee allergy status: Secondary | ICD-10-CM | POA: Diagnosis not present

## 2020-07-24 DIAGNOSIS — K589 Irritable bowel syndrome without diarrhea: Secondary | ICD-10-CM | POA: Diagnosis not present

## 2020-07-24 DIAGNOSIS — D509 Iron deficiency anemia, unspecified: Secondary | ICD-10-CM | POA: Diagnosis not present

## 2020-07-24 DIAGNOSIS — Z8616 Personal history of COVID-19: Secondary | ICD-10-CM | POA: Diagnosis not present

## 2020-07-24 HISTORY — PX: COLONOSCOPY WITH PROPOFOL: SHX5780

## 2020-07-24 LAB — PREGNANCY, URINE: Preg Test, Ur: NEGATIVE

## 2020-07-24 SURGERY — COLONOSCOPY WITH PROPOFOL
Anesthesia: General

## 2020-07-24 MED ORDER — LIDOCAINE HCL (CARDIAC) PF 100 MG/5ML IV SOSY
PREFILLED_SYRINGE | INTRAVENOUS | Status: DC | PRN
  Administered 2020-07-24: 50 mg via INTRAVENOUS

## 2020-07-24 MED ORDER — ACETAMINOPHEN 325 MG PO TABS
ORAL_TABLET | ORAL | Status: AC
  Filled 2020-07-24: qty 2

## 2020-07-24 MED ORDER — STERILE WATER FOR IRRIGATION IR SOLN
Status: DC | PRN
  Administered 2020-07-24: 200 mL

## 2020-07-24 MED ORDER — ACETAMINOPHEN 325 MG PO TABS
650.0000 mg | ORAL_TABLET | Freq: Once | ORAL | Status: AC
  Administered 2020-07-24: 650 mg via ORAL

## 2020-07-24 MED ORDER — LACTATED RINGERS IV SOLN: INTRAVENOUS | Status: DC

## 2020-07-24 MED ORDER — PROPOFOL 10 MG/ML IV BOLUS
INTRAVENOUS | Status: DC | PRN
  Administered 2020-07-24: 140 mg via INTRAVENOUS
  Administered 2020-07-24: 60 mg via INTRAVENOUS
  Administered 2020-07-24 (×4): 50 mg via INTRAVENOUS

## 2020-07-24 NOTE — Interval H&P Note (Signed)
History and Physical Interval Note:  07/24/2020 9:38 AM  Charlotte Mccormick  has presented today for surgery, with the diagnosis of anemia.  The various methods of treatment have been discussed with the patient and family. After consideration of risks, benefits and other options for treatment, the patient has consented to  Procedure(s) with comments: COLONOSCOPY WITH PROPOFOL (N/A) - 11:00am as a surgical intervention.  The patient's history has been reviewed, patient examined, no change in status, stable for surgery.  I have reviewed the patient's chart and labs.  Questions were answered to the patient's satisfaction.     [name]

## 2020-07-24 NOTE — Transfer of Care (Signed)
Immediate Anesthesia Transfer of Care Note  Patient: Charlotte Mccormick  Procedure(s) Performed: COLONOSCOPY WITH PROPOFOL (N/A )  Patient Location: Endoscopy Unit  Anesthesia Type:General  Level of Consciousness: drowsy  Airway & Oxygen Therapy: Patient Spontanous Breathing  Post-op Assessment: Report given to RN and Post -op Vital signs reviewed and stable  Post vital signs: Reviewed and stable  Last Vitals:  Vitals Value Taken Time  BP 96/59 07/24/20 1045  Temp 37.1 C 07/24/20 1045  Pulse 81 07/24/20 1045  Resp 17 07/24/20 1045  SpO2 100 % 07/24/20 1045    Last Pain:  Vitals:   07/24/20 1045  TempSrc: Oral  PainSc:       Patients Stated Pain Goal: 6 (04[birth year] 0802)  Complications: No complications documented.

## 2020-07-24 NOTE — Anesthesia Preprocedure Evaluation (Signed)
Anesthesia Evaluation  Patient identified by MRN, date of birth, ID band Patient awake    Reviewed: Allergy & Precautions, H&P , NPO status , Patient's Chart, lab work & pertinent test results, reviewed documented beta blocker date and time   Airway Mallampati: II  TM Distance: >3 FB Neck ROM: full    Dental no notable dental hx.    Pulmonary asthma , former smoker,    Pulmonary exam normal breath sounds clear to auscultation       Cardiovascular Exercise Tolerance: Good negative cardio ROS   Rhythm:regular Rate:Normal     Neuro/Psych PSYCHIATRIC DISORDERS Anxiety Depression negative neurological ROS     GI/Hepatic Neg liver ROS, GERD  Medicated,  Endo/Other  negative endocrine ROS  Renal/GU negative Renal ROS  negative genitourinary   Musculoskeletal   Abdominal   Peds  Hematology  (+) Blood dyscrasia, anemia ,   Anesthesia Other Findings   Reproductive/Obstetrics negative OB ROS                             Anesthesia Physical Anesthesia Plan  ASA: II  Anesthesia Plan: General   Post-op Pain Management:    Induction:   PONV Risk Score and Plan: Propofol infusion  Airway Management Planned:   Additional Equipment:   Intra-op Plan:   Post-operative Plan:   Informed Consent: I have reviewed the patients History and Physical, chart, labs and discussed the procedure including the risks, benefits and alternatives for the proposed anesthesia with the patient or authorized representative who has indicated his/her understanding and acceptance.     Dental Advisory Given  Plan Discussed with: CRNA  Anesthesia Plan Comments:         Anesthesia Quick Evaluation

## 2020-07-24 NOTE — Anesthesia Procedure Notes (Signed)
Date/Time: 07/24/2020 10:27 AM Performed by: Orlie Dakin, CRNA Pre-anesthesia Checklist: Patient identified, Emergency Drugs available, Suction available and Patient being monitored Patient Re-evaluated:Patient Re-evaluated prior to induction Oxygen Delivery Method: Nasal cannula Induction Type: IV induction Placement Confirmation: positive ETCO2

## 2020-07-24 NOTE — Op Note (Signed)
[facility] Patient Name: Charlotte Mccormick Procedure Date: 07/24/2020 10:14 AM MRN: [id number] Date of Birth: June 24, 1985 Attending MD: Elon Alas. Abbey Chatters DO CSN: [id number] Age: 35 Admit Type: Outpatient Procedure:                Colonoscopy Indications:              Iron deficiency anemia Providers:                Elon Alas. Abbey Chatters, DO, [facility], [place]                            Risa Grill, Technician Referring MD:              Medicines:                See the Anesthesia note for documentation of the                            administered medications Complications:            No immediate complications. Estimated Blood Loss:     Estimated blood loss: none. Procedure:                Pre-Anesthesia Assessment:                           - The anesthesia plan was to use monitored                            anesthesia care (MAC).                           After obtaining informed consent, the colonoscope                            was passed under direct vision. Throughout the                            procedure, the patient's blood pressure, pulse, and                            oxygen saturations were monitored continuously. The                            [id number]-[id number] ([id number]) scope was introduced through                            the anus and advanced to the the terminal ileum,                            with identification of the appendiceal orifice and                            IC valve. The colonoscopy was performed without                            difficulty. The patient tolerated the procedure  well. The quality of the bowel preparation was                            evaluated using the BBPS [facility] Bowel Preparation                            Scale) with scores of: Right Colon = 3, Transverse                            Colon = 3 and Left Colon = 3 (entire mucosa seen                            well with no residual staining,  small fragments of                            stool or opaque liquid). The total BBPS score                            equals 9. Scope In: 10:24:16 AM Scope Out: 10:41:28 AM Scope Withdrawal Time: 0 hours 6 minutes 11 seconds  Total Procedure Duration: 0 hours 17 minutes 12 seconds  Findings:      The perianal and digital rectal examinations were normal.      Non-bleeding internal hemorrhoids were found during endoscopy.      A few small-mouthed diverticula were found in the ascending colon.      The terminal ileum appeared normal. Impression:               - Non-bleeding internal hemorrhoids.                           - Diverticulosis in the ascending colon.                           - The examined portion of the ileum was normal.                           - No specimens collected. Moderate Sedation:      Per Anesthesia Care Recommendation:           - Patient has a contact number available for                            emergencies. The signs and symptoms of potential                            delayed complications were discussed with the                            patient. Return to normal activities tomorrow.                            Written discharge instructions were provided to the  patient.                           - Resume previous diet.                           - Continue present medications.                           - Repeat colonoscopy at age 67 or sooner if higher                            risk for screening purposes.                           - Return to GI clinic as previously scheduled.                            Consider capsule endoscopy for completeness. Procedure Code(s):        --- Professional ---                           [phone], Colonoscopy, flexible; diagnostic, including                            collection of specimen(s) by brushing or washing,                            when performed (separate procedure) Diagnosis  Code(s):        --- Professional ---                           K64.8, Other hemorrhoids                           D50.9, Iron deficiency anemia, unspecified                           K57.30, Diverticulosis of large intestine without                            perforation or abscess without bleeding CPT copyright 2019 American Medical Association. All rights reserved. The codes documented in this report are preliminary and upon coder review may  be revised to meet current compliance requirements. Elon Alas. Abbey Chatters, DO [place] Abbey Chatters, DO 07/24/2020 10:45:22 AM This report has been signed electronically. Number of Addenda: 0

## 2020-07-24 NOTE — Progress Notes (Signed)
Patient began to complain of headache after she became more alert and was sitting in chair. When Dr. Abbey Chatters came int to speak with patient, we asked for some tylenol for her headache. Patient was given tylenol before she ambulated to the car with assistance from the nurse.

## 2020-07-24 NOTE — Discharge Instructions (Signed)
°  Colonoscopy Discharge Instructions  Read the instructions outlined below and refer to this sheet in the next few weeks. These discharge instructions provide you with general information on caring for yourself after you leave the hospital. Your doctor may also give you specific instructions. While your treatment has been planned according to the most current medical practices available, unavoidable complications occasionally occur.   ACTIVITY  You may resume your regular activity, but move at a slower pace for the next 24 hours.   Take frequent rest periods for the next 24 hours.   Walking will help get rid of the air and reduce the bloated feeling in your belly (abdomen).   No driving for 24 hours (because of the medicine (anesthesia) used during the test).    Do not sign any important legal documents or operate any machinery for 24 hours (because of the anesthesia used during the test).  NUTRITION  Drink plenty of fluids.   You may resume your normal diet as instructed by your doctor.   Begin with a light meal and progress to your normal diet. Heavy or fried foods are harder to digest and may make you feel sick to your stomach (nauseated).   Avoid alcoholic beverages for 24 hours or as instructed.  MEDICATIONS  You may resume your normal medications unless your doctor tells you otherwise.  WHAT YOU CAN EXPECT TODAY  Some feelings of bloating in the abdomen.   Passage of more gas than usual.   Spotting of blood in your stool or on the toilet paper.  IF YOU HAD POLYPS REMOVED DURING THE COLONOSCOPY:  No aspirin products for 7 days or as instructed.   No alcohol for 7 days or as instructed.   Eat a soft diet for the next 24 hours.  FINDING OUT THE RESULTS OF YOUR TEST Not all test results are available during your visit. If your test results are not back during the visit, make an appointment with your caregiver to find out the results. Do not assume everything is normal if  you have not heard from your caregiver or the medical facility. It is important for you to follow up on all of your test results.  SEEK IMMEDIATE MEDICAL ATTENTION IF:  You have more than a spotting of blood in your stool.   Your belly is swollen (abdominal distention).   You are nauseated or vomiting.   You have a temperature over 101.   You have abdominal pain or discomfort that is severe or gets worse throughout the day.   Your colon overall looked healthy.  I did not find any evidence of polyps or cancer.  No underlying inflammatory bowel disease such as Crohn's disease or ulcerative colitis.  I do not have an answer for your ongoing anemia based on my exam today.  We may need to consider capsule endoscopy to further evaluate the small bowel which we cannot reach with scopes.  Follow-up with GI as previously scheduled to discuss this further.  Otherwise we will plan on repeat colonoscopy at age 49 for colon cancer screening purposes.  I hope you have a great rest of your week!  Elon Alas. Abbey Chatters, D.O. Gastroenterology and Hepatology [facility]

## 2020-07-24 NOTE — Telephone Encounter (Signed)
Phoned and LM on pts vm to return call regarding having her labs done or not.

## 2020-07-24 NOTE — Anesthesia Postprocedure Evaluation (Signed)
Anesthesia Post Note  Patient: Research officer, political party  Procedure(s) Performed: COLONOSCOPY WITH PROPOFOL (N/A )  Patient location during evaluation: Endoscopy Anesthesia Type: General Level of consciousness: awake and alert and oriented Pain management: pain level controlled Vital Signs Assessment: post-procedure vital signs reviewed and stable Respiratory status: spontaneous breathing, nonlabored ventilation and respiratory function stable Cardiovascular status: blood pressure returned to baseline and stable Postop Assessment: no apparent nausea or vomiting Anesthetic complications: no   No complications documented.   Last Vitals:  Vitals:   07/24/20 0926 07/24/20 1045  BP: 119/69 (!) 96/59  Pulse:  81  Resp: 19 17  Temp: 36.8 C 37.1 C  SpO2: 100% 100%    Last Pain:  Vitals:   07/24/20 1045  TempSrc: Oral  PainSc: 0-No pain                 Orlie Dakin

## 2020-07-26 NOTE — Telephone Encounter (Signed)
Letter sent to pt regarding labs.

## 2020-07-26 NOTE — Progress Notes (Signed)
Dear Cori Razor,  Carlis Stable, NP wants to make sure that you have done or plan to do your bloodwork at [facility]. It is a CBC and Ferritin (per orders in early February/ last office visit). If you need [place] to re-print the orders let us know.   If you have any questions or concerns please give our office a call.   Thank you,  Floria Raveling, CMA

## 2020-07-27 ENCOUNTER — Encounter (HOSPITAL_COMMUNITY): Payer: Self-pay | Admitting: Internal Medicine

## 2020-08-02 ENCOUNTER — Other Ambulatory Visit: Payer: Self-pay

## 2020-08-02 DIAGNOSIS — D649 Anemia, unspecified: Secondary | ICD-10-CM

## 2020-08-15 ENCOUNTER — Other Ambulatory Visit: Payer: Self-pay | Admitting: Nurse Practitioner

## 2020-08-16 LAB — CBC WITH DIFFERENTIAL/PLATELET
Basophils Absolute: 0.1 10*3/uL (ref 0.0–0.2)
Basos: 1 %
EOS (ABSOLUTE): 0 10*3/uL (ref 0.0–0.4)
Eos: 1 %
Hematocrit: 30.3 % — ABNORMAL LOW (ref 34.0–46.6)
Hemoglobin: 8.5 g/dL — ABNORMAL LOW (ref 11.1–15.9)
Immature Grans (Abs): 0 10*3/uL (ref 0.0–0.1)
Immature Granulocytes: 0 %
Lymphocytes Absolute: 1.2 10*3/uL (ref 0.7–3.1)
Lymphs: 34 %
MCH: 18.6 pg — ABNORMAL LOW (ref 26.6–33.0)
MCHC: 28.1 g/dL — ABNORMAL LOW (ref 31.5–35.7)
MCV: 66 fL — ABNORMAL LOW (ref 79–97)
Monocytes Absolute: 0.4 10*3/uL (ref 0.1–0.9)
Monocytes: 12 %
Neutrophils Absolute: 1.9 10*3/uL (ref 1.4–7.0)
Neutrophils: 52 %
Platelets: 319 10*3/uL (ref 150–450)
RBC: 4.56 x10E6/uL (ref 3.77–5.28)
RDW: 18.1 % — ABNORMAL HIGH (ref 11.7–15.4)
WBC: 3.6 10*3/uL (ref 3.4–10.8)

## 2020-08-16 NOTE — Progress Notes (Signed)
Noted. Reviewed recent EGD and TCS, no significant findings. Dr. Abbey Chatters recommended capsule endoscopy. He is ok to get this booked without office follow-up.  Please tell the patient her hgb remains low. I think we need to do a capsule study (as Dr. Abbey Chatters recommended after her colonoscopy) to make sure the bleeding isn't coming from the small intestines. She should also be referred to hematology for management of her anemia including possible IV iron (because of her persistently long standing anemia and her difficulty tolerating oral iron).  If she's agreeable, please schedule capsule and refer to heme/onc.

## 2020-08-16 NOTE — Patient Instructions (Signed)
CBC results placed on Charlotte Mccormick's desk. Informed Charlotte Mccormick of Hgb 8.5.

## 2020-08-17 ENCOUNTER — Other Ambulatory Visit: Payer: Self-pay

## 2020-08-17 DIAGNOSIS — D649 Anemia, unspecified: Secondary | ICD-10-CM

## 2020-08-17 NOTE — Progress Notes (Unsigned)
am

## 2020-08-17 NOTE — Progress Notes (Signed)
Called pt, OV rescheduled to 10/10/20.

## 2020-08-17 NOTE — Progress Notes (Signed)
Called pt, informed her of Eric's recommendation. Givens scheduled for 09/28/20 at 7:30am. Orders entered. Message sent to Roseanne Kaufman NP to read Givens. Instructions mailed. Referral sent to hematology via Epic.  Randall Hiss, she wants to know if she needs to keep OV 09/12/20 with you?

## 2020-08-17 NOTE — Progress Notes (Signed)
Let's postpone her follow-up visit until after the capsule so we have more information to look at.

## 2020-08-22 ENCOUNTER — Telehealth: Payer: Self-pay

## 2020-08-22 NOTE — Telephone Encounter (Signed)
PA for Givens capsule study submitted via [employer] for [employer] Case requires clinical review. Clinical notes uploaded. Ref# [id number].

## 2020-08-23 ENCOUNTER — Telehealth (INDEPENDENT_AMBULATORY_CARE_PROVIDER_SITE_OTHER): Payer: Medicare HMO | Admitting: Psychiatry

## 2020-08-23 ENCOUNTER — Other Ambulatory Visit: Payer: Self-pay

## 2020-08-23 DIAGNOSIS — F410 Panic disorder [episodic paroxysmal anxiety] without agoraphobia: Secondary | ICD-10-CM | POA: Diagnosis not present

## 2020-08-23 DIAGNOSIS — F4310 Post-traumatic stress disorder, unspecified: Secondary | ICD-10-CM

## 2020-08-23 DIAGNOSIS — F330 Major depressive disorder, recurrent, mild: Secondary | ICD-10-CM

## 2020-08-23 MED ORDER — BUPROPION HCL ER (XL) 300 MG PO TB24: 300.0000 mg | ORAL_TABLET | Freq: Every day | ORAL | 3 refills | Status: DC

## 2020-08-23 MED ORDER — PRAZOSIN HCL 5 MG PO CAPS: 5.0000 mg | ORAL_CAPSULE | Freq: Every day | ORAL | 3 refills | Status: AC

## 2020-08-23 MED ORDER — ESCITALOPRAM OXALATE 10 MG PO TABS: 10.0000 mg | ORAL_TABLET | Freq: Every day | ORAL | 2 refills | Status: DC

## 2020-08-23 MED ORDER — DIAZEPAM 5 MG PO TABS: 5.0000 mg | ORAL_TABLET | Freq: Two times a day (BID) | ORAL | 2 refills | Status: AC | PRN
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
## 2020-08-24 ENCOUNTER — Other Ambulatory Visit: Payer: Self-pay

## 2020-08-24 DIAGNOSIS — D649 Anemia, unspecified: Secondary | ICD-10-CM

## 2020-08-28 ENCOUNTER — Inpatient Hospital Stay (HOSPITAL_COMMUNITY): Payer: Medicare HMO

## 2020-08-28 ENCOUNTER — Other Ambulatory Visit: Payer: Self-pay

## 2020-08-28 ENCOUNTER — Inpatient Hospital Stay (HOSPITAL_COMMUNITY): Payer: Medicare HMO | Attending: Hematology | Admitting: Hematology

## 2020-08-28 VITALS — BP 119/76 | HR 83 | Temp 97.0°F | Resp 17 | Ht 62.0 in | Wt 166.8 lb

## 2020-08-28 VITALS — BP 113/65 | HR 84 | Temp 97.2°F | Resp 18

## 2020-08-28 DIAGNOSIS — Z79899 Other long term (current) drug therapy: Secondary | ICD-10-CM | POA: Insufficient documentation

## 2020-08-28 DIAGNOSIS — Z832 Family history of diseases of the blood and blood-forming organs and certain disorders involving the immune mechanism: Secondary | ICD-10-CM | POA: Diagnosis not present

## 2020-08-28 DIAGNOSIS — D509 Iron deficiency anemia, unspecified: Secondary | ICD-10-CM

## 2020-08-28 DIAGNOSIS — D500 Iron deficiency anemia secondary to blood loss (chronic): Secondary | ICD-10-CM

## 2020-08-28 DIAGNOSIS — R5383 Other fatigue: Secondary | ICD-10-CM

## 2020-08-28 DIAGNOSIS — R531 Weakness: Secondary | ICD-10-CM

## 2020-08-28 DIAGNOSIS — Z809 Family history of malignant neoplasm, unspecified: Secondary | ICD-10-CM | POA: Diagnosis not present

## 2020-08-28 LAB — VITAMIN B12: Vitamin B-12: 517 pg/mL (ref 180–914)

## 2020-08-28 LAB — LACTATE DEHYDROGENASE: LDH: 147 U/L (ref 98–192)

## 2020-08-28 LAB — FOLATE: Folate: 19.4 ng/mL (ref >5.9)

## 2020-08-28 LAB — VITAMIN D 25 HYDROXY (VIT D DEFICIENCY, FRACTURES): Vit D, 25-Hydroxy: 23.16 ng/mL — ABNORMAL LOW (ref 30–100)

## 2020-08-28 MED ORDER — DIPHENHYDRAMINE HCL 25 MG PO CAPS
50.0000 mg | ORAL_CAPSULE | Freq: Once | ORAL | Status: AC
  Administered 2020-08-28: 50 mg via ORAL
  Filled 2020-08-28: qty 2

## 2020-08-28 MED ORDER — SODIUM CHLORIDE 0.9 % IV SOLN: Freq: Once | INTRAVENOUS | Status: AC

## 2020-08-28 MED ORDER — SODIUM CHLORIDE 0.9 % IV SOLN
300.0000 mg | Freq: Once | INTRAVENOUS | Status: AC
  Administered 2020-08-28: 300 mg via INTRAVENOUS
  Filled 2020-08-28: qty 15

## 2020-08-28 MED ORDER — ACETAMINOPHEN 325 MG PO TABS
650.0000 mg | ORAL_TABLET | Freq: Once | ORAL | Status: AC
  Administered 2020-08-28: 650 mg via ORAL
  Filled 2020-08-28: qty 2

## 2020-08-28 MED ORDER — METHYLPREDNISOLONE SODIUM SUCC 125 MG IJ SOLR
125.0000 mg | Freq: Once | INTRAMUSCULAR | Status: AC
  Administered 2020-08-28: 125 mg via INTRAVENOUS
  Filled 2020-08-28: qty 2

## 2020-08-28 NOTE — Telephone Encounter (Signed)
Received voicemail from nurse reviewer at [facility] requesting additional clinical info. [phone]. Unable to understand questions.  Called [employer], spoke to [place]. Additional clinical info given as requested. She will forward answers to nurse reviewer. Call ref# [id number].

## 2020-08-28 NOTE — Patient Instructions (Addendum)
[place] at [facility] Discharge Instructions  You were seen today by Tarri Abernethy PA-C and Dr. Delton Coombes for your iron deficiency anemia.    LABS: You will need to stop on the first floor of the hospital to have labs checked today.  Return for additional labs in 4 weeks.   MEDICATIONS: IV Venofer (iron) x 3 doses  FOLLOW-UP APPOINTMENT: Phone visit with Tarri Abernethy PA-C in 4-5 weeks ** Continue to see GI doctor to look for source of gastrointestinal blood loss. ** Please make an appointment with your gynecologist to discuss your heavy menstrual bleeding.  Thank you for choosing [place] at [facility] to provide your oncology and hematology care.  To afford each patient quality time with our provider, please arrive at least 15 minutes before your scheduled appointment time.   If you have a lab appointment with the [facility] please come in thru the Main Entrance and check in at the main information desk.  You need to re-schedule your appointment should you arrive 10 or more minutes late.  We strive to give you quality time with our providers, and arriving late affects you and other patients whose appointments are after yours.  Also, if you no show three or more times for appointments you may be dismissed from the clinic at the providers discretion.     Again, thank you for choosing [facility].  Our hope is that these requests will decrease the amount of time that you wait before being seen by our physicians.       _____________________________________________________________  Should you have questions after your visit to [facility], please contact our office at [phone] and follow the prompts.  Our office hours are 8:00 a.m. and 4:30 p.m. Monday - Friday.  Please note that voicemails left after 4:00 p.m. may not be returned until the following business day.  We are closed weekends and major  holidays.  You do have access to a nurse 24-7, just call the main number to the clinic [phone] and do not press any options, hold on the line and a nurse will answer the phone.    For prescription refill requests, have your pharmacy contact our office and allow 72 hours.    Due to Covid, you will need to wear a mask upon entering the hospital. If you do not have a mask, a mask will be given to you at the Main Entrance upon arrival. For doctor visits, patients may have 1 support person age 35 or older with them. For treatment visits, patients can not have anyone with them due to social distancing guidelines and our immunocompromised population.

## 2020-08-28 NOTE — Patient Instructions (Signed)
Iron Sucrose infusion What is this medicine? IRON SUCROSE (AHY ern SOO krohs) is an iron complex. Iron is used to make healthy red blood cells, which carry oxygen and nutrients throughout the body. This medicine is used to treat iron deficiency anemia in people with chronic kidney disease. This medicine may be used for other purposes; ask your health care provider or pharmacist if you have questions. COMMON BRAND NAME(S): Venofer What should I tell my health care provider before I take this medicine? They need to know if you have any of these conditions:  anemia not caused by low iron levels  heart disease  high levels of iron in the blood  kidney disease  liver disease  an unusual or allergic reaction to iron, other medicines, foods, dyes, or preservatives  pregnant or trying to get pregnant  breast-feeding How should I use this medicine? This medicine is for infusion into a vein. It is given by a health care professional in a hospital or clinic setting. Talk to your pediatrician regarding the use of this medicine in children. While this drug may be prescribed for children as young as 2 years for selected conditions, precautions do apply. Overdosage: If you think you have taken too much of this medicine contact a poison control center or emergency room at once. NOTE: This medicine is only for you. Do not share this medicine with others. What if I miss a dose? It is important not to miss your dose. Call your doctor or health care professional if you are unable to keep an appointment. What may interact with this medicine? Do not take this medicine with any of the following medications:  deferoxamine  dimercaprol  other iron products This medicine may also interact with the following medications:  chloramphenicol  deferasirox This list may not describe all possible interactions. Give your health care provider a list of all the medicines, herbs, non-prescription drugs, or  dietary supplements you use. Also tell them if you smoke, drink alcohol, or use illegal drugs. Some items may interact with your medicine. What should I watch for while using this medicine? Visit your doctor or healthcare professional regularly. Tell your doctor or healthcare professional if your symptoms do not start to get better or if they get worse. You may need blood work done while you are taking this medicine. You may need to follow a special diet. Talk to your doctor. Foods that contain iron include: whole grains/cereals, dried fruits, beans, or peas, leafy green vegetables, and organ meats (liver, kidney). What side effects may I notice from receiving this medicine? Side effects that you should report to your doctor or health care professional as soon as possible:  allergic reactions like skin rash, itching or hives, swelling of the face, lips, or tongue  breathing problems  changes in blood pressure  cough  fast, irregular heartbeat  feeling faint or lightheaded, falls  fever or chills  flushing, sweating, or hot feelings  joint or muscle aches/pains  seizures  swelling of the ankles or feet  unusually weak or tired Side effects that usually do not require medical attention (report to your doctor or health care professional if they continue or are bothersome):  diarrhea  feeling achy  headache  irritation at site where injected  nausea, vomiting  stomach upset  tiredness This list may not describe all possible side effects. Call your doctor for medical advice about side effects. You may report side effects to FDA at 1-800-FDA-1088. Where should I keep   my medicine? This drug is given in a hospital or clinic and will not be stored at home. NOTE: This sheet is a summary. It may not cover all possible information. If you have questions about this medicine, talk to your doctor, pharmacist, or health care provider.  2021 [employer]vier/[employer] Standard (2011-02-28  17:14:35)  

## 2020-08-28 NOTE — Progress Notes (Signed)
Patient presents today for Venofer infusion.  Vital signs WNL.  Patient complains of fatigue.    Venofer infusion given today per MD orders.  Stable during infusion without adverse affects.  Vital signs stable.  No complaints at this time.  Discharge from clinic ambulatory in stable condition.  Alert and oriented X 3.  Follow up with [facility] as scheduled.

## 2020-08-28 NOTE — Progress Notes (Signed)
[name], [name]   CLINIC:  Medical Oncology/Hematology  CONSULT NOTE  Patient Care Team: Denyce Robert, FNP as PCP - General (Family Medicine) Danie Binder, MD (Inactive) as Consulting Physician (Gastroenterology)  CHIEF COMPLAINTS/PURPOSE OF CONSULTATION:  Iron deficiency anemia  HISTORY OF PRESENTING ILLNESS:  Charlotte Mccormick 35 y.o. female is here at the request of Dr. Abbey Chatters / NP [facility] (gastroenterology) for iron deficiency anemia.  Patient has a history of longstanding anemia, and has difficulty tolerating oral iron supplements.  Patient's most recent EGD was 01/17/2020 which showed gastritis without clear signs of bleeding; colonoscopy on 07/24/2020 showed nonbleeding internal hemorrhoids and diverticulosis, but was negative for signs of bleeding.  She has been scheduled for capsule study in April 2022.  Per patient, she has been anemic for about 15 years.  She reports intermittent black stools in the past, but has not had any melena for the past 6 months. She reports irregular periods (twice monthly), but these are more regular after being started on Methimazole for her hyperthyroidism.  Periods are described as heavy (changes a pad every hour for the first 2-3 days).  She does not see a gynecologist regularly.   She has significant fatigue wiht little to no energy.  This is significantly affecting her life. She has three young children at home.  She cannot cook a meal for her family without stopping to take a break and rest due to feeling light-headed and presyncopal.  She denies recent chest pain on exertion, shortness of breath on minimal exertion, syncopal episodes, or palpitations.  She admits to pica, reports eating several cups of ice each day. She had not noticed any recent bleeding such as epistaxis, hematuria, hematochezia, or melena. The patient occasionally takes meloxicam for pain, but denies other over  the counter NSAID ingestion. She is not on antiplatelets agents. She had no prior history or diagnosis of cancer. Her age appropriate screening programs are up-to-date.  She never donated blood or received blood transfusion The patient was prescribed oral iron supplements 325 mg iron once daily, but unable to tolerate due to severe constipation.  There was no significant improvement in her iron labs or hemoglobin with oral iron therapy.  Patient's father has cancer of unknown type.  He is also anemic, has received multiple blood transfusions over the past year.  No family history of sickle cell anemia.  Patient is a lifelong non-smoker.  Does not use drugs or alcohol.  Review of most recent CBC from 08/15/2020 showed hemoglobin 8.5 (microcytic with MCV 66).  Per review of labs over the past several years, her hemoglobin appears to have been dropping since about 2018 (with hemoglobin ranging from 11.0 - 12.0 before 2018).  Normal platelets and WBCs.  Most recent ferritin 5, as checked on 07/20/2020, consistent with iron deficiency.  MEDICAL HISTORY:  Past Medical History:  Diagnosis Date  . Abnormal uterine bleeding (AUB) 12/12/2015  . Anemia   . Anxiety   . Asthma   . Constipation   . Depression   . Fibroid   . Genital herpes   . GERD (gastroesophageal reflux disease)   . History of chlamydia   . Hyperemesis   . IBS (irritable bowel syndrome)   . Insomnia   . Mental disorder    PTSD, ANXIETY,Depression  . PTSD (post-traumatic stress disorder)   . Sinusitis   . Vaginal dryness 12/12/2015    SURGICAL HISTORY: Past Surgical History:  Procedure Laterality Date  . COLONOSCOPY     2016  . COLONOSCOPY N/A 04/08/2016   Procedure: COLONOSCOPY;  Surgeon: Danie Binder, MD;  [facility];  Service: Endoscopy;  Laterality: N/A;  10:15 AM  . COLONOSCOPY WITH PROPOFOL N/A 07/24/2020   Procedure: COLONOSCOPY WITH PROPOFOL;  Surgeon: [name], DO;  [facility];   Service: Endoscopy;  Laterality: N/A;  11:00am  . ESOPHAGOGASTRODUODENOSCOPY N/A 04/08/2016   Procedure: ESOPHAGOGASTRODUODENOSCOPY (EGD);  Surgeon: Danie Binder, MD;  [facility];  Service: Endoscopy;  Laterality: N/A;  . ESOPHAGOGASTRODUODENOSCOPY (EGD) WITH PROPOFOL N/A 01/17/2020   Procedure: ESOPHAGOGASTRODUODENOSCOPY (EGD) WITH PROPOFOL;  Surgeon: [name], DO;  [facility];  Service: Endoscopy;  Laterality: N/A;  8:15am  . PERINEUM REPAIR    . UPPER GASTROINTESTINAL ENDOSCOPY  2016    SOCIAL HISTORY: Social History   Socioeconomic History  . Marital status: Married    Spouse name: Not on file  . Number of children: 3  . Years of education: Not on file  . Highest education level: Not on file  Occupational History  . Not on file  Tobacco Use  . Smoking status: Former Smoker    Packs/day: 0.50    Years: 5.00    Pack years: 2.50    Types: Cigarettes    Quit date: 06/03/2010    Years since quitting: 10.2  . Smokeless tobacco: Never Used  Vaping Use  . Vaping Use: Never used  Substance and Sexual Activity  . Alcohol use: Not Currently    Alcohol/week: 2.0 standard drinks    Types: 2 Glasses of wine per week    Comment: occ wine; denied 11/02/19  . Drug use: Yes    Types: Marijuana    Comment: 11/02/19 states she used gummies 1-2 times a month.  . Sexual activity: Yes    Birth control/protection: Pill  Other Topics Concern  . Not on file  Social History Narrative   ** Merged History Encounter **       Social Determinants of Health   Financial Resource Strain: Not on file  Food Insecurity: Not on file  Transportation Needs: Not on file  Physical Activity: Not on file  Stress: Not on file  Social Connections: Not on file  Intimate Partner Violence: Not on file    FAMILY HISTORY: Family History  Problem Relation Age of Onset  . Heart disease Mother   . Hypertension Mother   . Hyperlipidemia Mother   . Heart attack Mother   .  Heart failure Mother   . Hypertension Father   . Hyperlipidemia Father   . Diabetes Father   . Cancer Father   . Thyroid disease Father   . Stroke Father   . Epilepsy Brother   . Eczema Daughter   . Autism spectrum disorder Son   . Colon cancer Neg Hx     ALLERGIES:  is allergic to bee venom, hydrocodone, penicillins, and reglan [metoclopramide].  MEDICATIONS:  Current Outpatient Medications  Medication Sig Dispense Refill  . acetaminophen (TYLENOL) 500 MG tablet Take 500 mg by mouth every 6 (six) hours as needed for mild pain or moderate pain.    Marland Kitchen albuterol (VENTOLIN HFA) 108 (90 Base) MCG/ACT inhaler Inhale 1 puff into the lungs every 6 (six) hours as needed for wheezing or shortness of breath.    Derrill Memo ON 09/11/2020] buPROPion (WELLBUTRIN XL) 300 MG 24 hr tablet Take 1 tablet (300 mg  total) by mouth daily. 30 tablet 3  . dexlansoprazole (DEXILANT) 60 MG capsule Take 1 capsule (60 mg total) by mouth daily. 90 capsule 3  . diazepam (VALIUM) 5 MG tablet Take 1 tablet (5 mg total) by mouth 2 (two) times daily as needed for anxiety. 60 tablet 2  . [START ON 10/16/2020] escitalopram (LEXAPRO) 10 MG tablet Take 1 tablet (10 mg total) by mouth daily. 30 tablet 2  . linaclotide (LINZESS) 72 MCG capsule Take 1 capsule (72 mcg total) by mouth daily before breakfast. 30 capsule 5  . meloxicam (MOBIC) 15 MG tablet Take 15 mg by mouth daily as needed for pain.    . methimazole (TAPAZOLE) 5 MG tablet Take 1 tablet (5 mg total) by mouth daily. 30 tablet 3  . [START ON 09/11/2020] prazosin (MINIPRESS) 5 MG capsule Take 1 capsule (5 mg total) by mouth at bedtime. 30 capsule 3  . rizatriptan (MAXALT) 10 MG tablet Take 1 tablet by mouth daily as needed. At onset of headache    . VITAMIN D PO Take 1 tablet by mouth daily.     No current facility-administered medications for this visit.    REVIEW OF SYSTEMS:   Review of Systems  Constitutional: Positive for fatigue. Negative for appetite change,  chills, diaphoresis, fever and unexpected weight change.  HENT:   Negative for lump/mass and nosebleeds.   Eyes: Negative for eye problems.  Respiratory: Negative for cough, hemoptysis and shortness of breath.   Cardiovascular: Negative for chest pain, leg swelling and palpitations.  Gastrointestinal: Negative for abdominal pain, blood in stool, constipation, diarrhea, nausea and vomiting.  Genitourinary: Negative for hematuria.   Skin: Negative.   Neurological: Positive for light-headedness. Negative for dizziness and headaches.  Hematological: Does not bruise/bleed easily.      PHYSICAL EXAMINATION: ECOG PERFORMANCE STATUS: 1 - Symptomatic but completely ambulatory  Vitals:   08/28/20 0853  BP: 119/76  Pulse: 83  Resp: 17  Temp: (!) 97 F (36.1 C)  SpO2: 100%   Filed Weights   08/28/20 0853  Weight: 166 lb 12.8 oz (75.7 kg)    Physical Exam Constitutional:      Appearance: Normal appearance.     Comments: Generally weak-appearing, fatigued  HENT:     Head: Normocephalic and atraumatic.     Mouth/Throat:     Mouth: Mucous membranes are moist.  Eyes:     Extraocular Movements: Extraocular movements intact.     Pupils: Pupils are equal, round, and reactive to light.  Cardiovascular:     Rate and Rhythm: Normal rate and regular rhythm.     Pulses: Normal pulses.     Heart sounds: Normal heart sounds.  Pulmonary:     Effort: Pulmonary effort is normal.     Breath sounds: Normal breath sounds.  Abdominal:     General: Bowel sounds are normal.     Palpations: Abdomen is soft.     Tenderness: There is no abdominal tenderness.  Musculoskeletal:        General: No swelling.     Right lower leg: No edema.     Left lower leg: No edema.  Lymphadenopathy:     Cervical: No cervical adenopathy.  Skin:    General: Skin is warm and dry.     Coloration: Skin is pale.  Neurological:     General: No focal deficit present.     Mental Status: She is alert and oriented to  person, place, and time.  Psychiatric:  Mood and Affect: Mood normal.        Behavior: Behavior normal.       LABORATORY DATA:  I have reviewed the data as listed Recent Results (from the past 2160 hour(s))  Novel Coronavirus, NAA ([facility])     Status: Abnormal   Collection Time: 06/01/20 10:19 AM   Specimen: Nasopharyngeal(NP) swabs in vial transport medium   Nasopharynge  Result Value Ref Range   SARS-CoV-2, NAA Detected (A) Not Detected    Comment: Patients who have a positive COVID-19 test result may now have treatment options. Treatment options are available for patients with mild to moderate symptoms and for hospitalized patients. Visit our website at [email or web address] for resources and information. This nucleic acid amplification test was developed and its performance characteristics determined by [employer]. Nucleic acid amplification tests include RT-PCR and TMA. This test has not been FDA cleared or approved. This test has been authorized by FDA under an Emergency Use Authorization (EUA). This test is only authorized for the duration of time the declaration that circumstances exist justifying the authorization of the emergency use of in vitro diagnostic tests for detection of SARS-CoV-2 virus and/or diagnosis of COVID-19 infection under section 564(b)(1) of the Act, 21 U.S.C. 712WPY-0(D) (1), unless the authorization is terminated or revoked sooner. When diagnostic testing is negativ e, the possibility of a false negative result should be considered in the context of a patient's recent exposures and the presence of clinical signs and symptoms consistent with COVID-19. An individual without symptoms of COVID-19 and who is not shedding SARS-CoV-2 virus would expect to have a negative (not detected) result in this assay.   Novel Coronavirus, NAA ([facility])     Status: None   Collection Time: 06/09/20  1:19 PM   Specimen: Nasopharyngeal(NP)  swabs in vial transport medium   Nasopharynge  Screenin  Result Value Ref Range   SARS-CoV-2, NAA Not Detected Not Detected    Comment: This nucleic acid amplification test was developed and its performance characteristics determined by [employer]. Nucleic acid amplification tests include RT-PCR and TMA. This test has not been FDA cleared or approved. This test has been authorized by FDA under an Emergency Use Authorization (EUA). This test is only authorized for the duration of time the declaration that circumstances exist justifying the authorization of the emergency use of in vitro diagnostic tests for detection of SARS-CoV-2 virus and/or diagnosis of COVID-19 infection under section 564(b)(1) of the Act, 21 U.S.C. 983JAS-5(K) (1), unless the authorization is terminated or revoked sooner. When diagnostic testing is negative, the possibility of a false negative result should be considered in the context of a patient's recent exposures and the presence of clinical signs and symptoms consistent with COVID-19. An individual without symptoms of COVID-19 and who is not shedding SARS-CoV-2 virus wo uld expect to have a negative (not detected) result in this assay.   Novel Coronavirus, NAA ([facility])     Status: None   Collection Time: 06/21/20  5:52 PM   Specimen: Nasopharyngeal(NP) swabs in vial transport medium   Nasopharynge  Screenin  Result Value Ref Range   SARS-CoV-2, NAA Not Detected Not Detected    Comment: This nucleic acid amplification test was developed and its performance characteristics determined by [employer]. Nucleic acid amplification tests include RT-PCR and TMA. This test has not been FDA cleared or approved. This test has been authorized by FDA under an Emergency Use Authorization (EUA). This test is only authorized for the duration of time  the declaration that circumstances exist justifying the authorization of the emergency use of in  vitro diagnostic tests for detection of SARS-CoV-2 virus and/or diagnosis of COVID-19 infection under section 564(b)(1) of the Act, 21 U.S.C. 283MOQ-9(U) (1), unless the authorization is terminated or revoked sooner. When diagnostic testing is negative, the possibility of a false negative result should be considered in the context of a patient's recent exposures and the presence of clinical signs and symptoms consistent with COVID-19. An individual without symptoms of COVID-19 and who is not shedding SARS-CoV-2 virus wo uld expect to have a negative (not detected) result in this assay.   SARS-COV-2, NAA 2 DAY TAT     Status: None   Collection Time: 06/21/20  5:52 PM   Nasopharynge  Screenin  Result Value Ref Range   SARS-CoV-2, NAA 2 DAY TAT Performed   CBC with Differential/Platelet     Status: None   Collection Time: 07/11/20 12:10 PM  Result Value Ref Range   WBC CANCELED     Comment: TEST NOT PERFORMED . No lavender-top tube received.  Result canceled by the ancillary.   Ferritin     Status: None   Collection Time: 07/11/20 12:10 PM  Result Value Ref Range   Ferritin CANCELED     Comment: TEST NOT PERFORMED . No serum received.  Result canceled by the ancillary.   TSH     Status: Abnormal   Collection Time: 07/20/20  3:27 PM  Result Value Ref Range   TSH 0.130 (L) 0.450 - 4.500 uIU/mL  T4, free     Status: None   Collection Time: 07/20/20  3:27 PM  Result Value Ref Range   Free T4 1.18 0.82 - 1.77 ng/dL  T3, free     Status: Abnormal   Collection Time: 07/20/20  3:27 PM  Result Value Ref Range   T3, Free 4.8 (H) 2.0 - 4.4 pg/mL  CBC with Differential/Platelet     Status: Abnormal   Collection Time: 07/20/20  3:30 PM  Result Value Ref Range   WBC 3.3 (L) 3.4 - 10.8 x10E3/uL   RBC 4.55 3.77 - 5.28 x10E6/uL   Hemoglobin 8.6 (L) 11.1 - 15.9 g/dL   Hematocrit 30.2 (L) 34.0 - 46.6 %   MCV 66 (L) 79 - 97 fL   MCH 18.9 (L) 26.6 - 33.0 pg   MCHC 28.5 (L) 31.5 -  35.7 g/dL   RDW 16.9 (H) 11.7 - 15.4 %   Platelets 255 150 - 450 x10E3/uL   Neutrophils 43 Not Estab. %   Lymphs 43 Not Estab. %   Monocytes 12 Not Estab. %   Eos 1 Not Estab. %   Basos 1 Not Estab. %   Neutrophils Absolute 1.4 1.4 - 7.0 x10E3/uL   Lymphocytes Absolute 1.4 0.7 - 3.1 x10E3/uL   Monocytes Absolute 0.4 0.1 - 0.9 x10E3/uL   EOS (ABSOLUTE) 0.0 0.0 - 0.4 x10E3/uL   Basophils Absolute 0.0 0.0 - 0.2 x10E3/uL   Immature Granulocytes 0 Not Estab. %   Immature Grans (Abs) 0.0 0.0 - 0.1 x10E3/uL  Ferritin     Status: Abnormal   Collection Time: 07/20/20  3:30 PM  Result Value Ref Range   Ferritin 5 (L) 15 - 150 ng/mL  SARS CORONAVIRUS 2 (TAT 6-24 HRS) Nasopharyngeal Nasopharyngeal Swab     Status: None   Collection Time: 07/21/20  4:23 PM   Specimen: Nasopharyngeal Swab  Result Value Ref Range   SARS Coronavirus 2 NEGATIVE NEGATIVE  Comment: (NOTE) SARS-CoV-2 target nucleic acids are NOT DETECTED.  The SARS-CoV-2 RNA is generally detectable in upper and lower respiratory specimens during the acute phase of infection. Negative results do not preclude SARS-CoV-2 infection, do not rule out co-infections with other pathogens, and should not be used as the sole basis for treatment or other patient management decisions. Negative results must be combined with clinical observations, patient history, and epidemiological information. The expected result is Negative.  Fact Sheet for Patients: SugarRoll.be  Fact Sheet for Healthcare Providers: https://www.woods-mathews.com/  This test is not yet approved or cleared by the [place] FDA and  has been authorized for detection and/or diagnosis of SARS-CoV-2 by FDA under an Emergency Use Authorization (EUA). This EUA will remain  in effect (meaning this test can be used) for the duration of the COVID-19 declaration under Se ction 564(b)(1) of the Act, 21 U.S.C. section 360bbb-3(b)(1),  unless the authorization is terminated or revoked sooner.  Performed at [facility], [place] [address] [place], [place] [address]   Pregnancy, urine     Status: None   Collection Time: 07/24/20  9:25 AM  Result Value Ref Range   Preg Test, Ur NEGATIVE NEGATIVE    Comment:        THE SENSITIVITY OF THIS METHODOLOGY IS >20 mIU/mL. Performed at [facility], [address], [place], [place] [address]   CBC with Differential/Platelet     Status: Abnormal   Collection Time: 08/15/20  3:48 PM  Result Value Ref Range   WBC 3.6 3.4 - 10.8 x10E3/uL   RBC 4.56 3.77 - 5.28 x10E6/uL   Hemoglobin 8.5 (L) 11.1 - 15.9 g/dL   Hematocrit 30.3 (L) 34.0 - 46.6 %   MCV 66 (L) 79 - 97 fL   MCH 18.6 (L) 26.6 - 33.0 pg   MCHC 28.1 (L) 31.5 - 35.7 g/dL   RDW 18.1 (H) 11.7 - 15.4 %   Platelets 319 150 - 450 x10E3/uL   Neutrophils 52 Not Estab. %   Lymphs 34 Not Estab. %   Monocytes 12 Not Estab. %   Eos 1 Not Estab. %   Basos 1 Not Estab. %   Neutrophils Absolute 1.9 1.4 - 7.0 x10E3/uL   Lymphocytes Absolute 1.2 0.7 - 3.1 x10E3/uL   Monocytes Absolute 0.4 0.1 - 0.9 x10E3/uL   EOS (ABSOLUTE) 0.0 0.0 - 0.4 x10E3/uL   Basophils Absolute 0.1 0.0 - 0.2 x10E3/uL   Immature Granulocytes 0 Not Estab. %   Immature Grans (Abs) 0.0 0.0 - 0.1 x10E3/uL  VITAMIN D 25 Hydroxy (Vit-D Deficiency, Fractures)     Status: Abnormal   Collection Time: 08/28/20  1:37 PM  Result Value Ref Range   Vit D, 25-Hydroxy 23.16 (L) 30 - 100 ng/mL    Comment: (NOTE) Vitamin D deficiency has been defined by the Institute of Medicine  and an Endocrine Society practice guideline as a level of serum 25-OH  vitamin D less than 20 ng/mL (1,2). The Endocrine Society went on to  further define vitamin D insufficiency as a level between 21 and 29  ng/mL (2).  1. IOM (Institute of Medicine). 2010. Dietary reference intakes for  calcium and D. Pope: The [employer]. 2. Holick MF, Binkley [place],  Bischoff-Ferrari HA, et al. Evaluation,  treatment, and prevention of vitamin D deficiency: an Endocrine  Society clinical practice guideline, JCEM. 2011 Jul; 96(7): 1911-30.  Performed at [facility], [place] [address], [place],  [address]  Vitamin B12     Status: None   Collection Time: 08/28/20  1:37 PM  Result Value Ref Range   Vitamin B-12 517 180 - 914 pg/mL    Comment: (NOTE) This assay is not validated for testing neonatal or myeloproliferative syndrome specimens for Vitamin B12 levels. Performed at [facility] [address] [place], [place] [address]   Folate     Status: None   Collection Time: 08/28/20  1:37 PM  Result Value Ref Range   Folate 19.4 >5.9 ng/mL    Comment: Performed at [facility], [address] [place], [place] [address]  Lactate dehydrogenase     Status: None   Collection Time: 08/28/20  1:37 PM  Result Value Ref Range   LDH 147 98 - 192 U/L    Comment: Performed at [facility], [address] [place], [place] [address]    RADIOGRAPHIC STUDIES: I have personally reviewed the radiological images as listed and agreed with the findings in the report. No results found.   ASSESSMENT: 1.  Microcytic anemia with iron deficiency -Longstanding history of anemia, secondary to menorrhagia and possible occult GI blood loss -EGD was 01/17/2020 which showed gastritis without clear signs of bleeding; colonoscopy on 07/24/2020 showed nonbleeding internal hemorrhoids and diverticulosis, but was negative for signs of bleeding.  She has been scheduled for capsule study in April 2022. -Failed oral iron therapy due to severe constipation and lack of improvement -Denies any GI blood loss within the past 6 months -Symptomatic anemia with significant fatigue -CBC from 08/15/2020 showed hemoglobin 8.5 (microcytic with MCV 66).  Per review of labs over the past several years, her hemoglobin appears to have been dropping since about 2018 (with hemoglobin ranging  from 11.0 - 12.0 before 2018).  Normal platelets and WBCs.  -Most recent ferritin 5, as checked on 07/20/2020, consistent with iron deficiency  2. Social and family history -Patient's father has cancer of unknown type.  He is also anemic, has received multiple blood transfusions over the past year. -Patient is a lifelong non-smoker.  Does not use drugs or alcohol   PLAN:  1.  Microcytic anemia with iron deficiency -Anemia likely secondary to longstanding iron deficiency from menorrhagia and possible GI blood loss.   -Failed oral iron therapy due to severe constipation and likely malabsorption -Hemoglobin 8.5 (08/15/2020), ferritin 5 (07/20/2020) -We will investigate other potential contributing factors by completing SPEP/FLC, vitamin D, vitamin B12, folate, copper, MMA, LDH, peripheral smear, haptoglobin -Schedule patient for IV iron infusion (Venofer 300 mg-300 mg-400 mg) -Patient should continue to follow-up with GI for ongoing work-up of source of blood loss -Recommend workup with gynecologist for menorrhagia -Will check hemoglobin electrophoresis after iron is replaced -Recheck CBC and iron studies in 4 weeks -RTC in 4-6 weeks   PLAN SUMMARY & DISPOSITION: -Labs today (copper, folate, haptoglobin, light chains, LDH, methylmalonic acid, peripheral smear, SPEP, vitamin B12, vitamin D) -IV Venofer x 1000 mg, divided into 3 doses -Labs in 4 weeks (CBC, iron/CBC, ferritin) -RTC in 4 to 6 weeks with phone visit to review lab results  All questions were answered. The patient knows to call the clinic with any problems, questions or concerns.   Medical decision making: Moderate (chronic illness with exacerbation, review of external note from GI doctors, review of prior test results, ordering new labs)  Time spent on visit: I spent 30 minutes counseling the patient face to face. The total time spent in the appointment was 45 minutes and more than  50% was on counseling.    I, Tarri Abernethy PA-C, have seen this patient in conjunction with Dr. Derek Jack. Greater than 50% of visit was performed by Dr. Delton Coombes.  I have independently reviewed and assessed this patient and agree with the HPI and assessment plan written by Tarri Abernethy, PA.  In short, severe microcytic anemia from iron deficiency from blood loss (menstrual as well as GI blood loss).  Will rule out other potential causes contributing to this anemia.  She will receive parenteral iron therapy and follow-up with [place] in 6 weeks.   Derek Jack, MD 08/28/20 7:08 PM

## 2020-08-28 NOTE — Telephone Encounter (Signed)
[place]ivens PA approved. Certification# [id number], valid 08/22/20-09/21/20.  Date of service for Givens is 09/28/20. [facility], spoke to [place], she will let nurse know date of service so she can update auth. Updated Josem Kaufmann will be faxed to office when completed. Call ref# [id number].

## 2020-08-29 ENCOUNTER — Other Ambulatory Visit: Payer: Self-pay

## 2020-08-29 LAB — KAPPA/LAMBDA LIGHT CHAINS
Kappa free light chain: 21 mg/L — ABNORMAL HIGH (ref 3.3–19.4)
Kappa, lambda light chain ratio: 1.13 (ref 0.26–1.65)
Lambda free light chains: 18.6 mg/L (ref 5.7–26.3)

## 2020-08-29 LAB — HAPTOGLOBIN: Haptoglobin: 167 mg/dL (ref 33–278)

## 2020-08-29 NOTE — Telephone Encounter (Signed)
Received updated PA for G[place] from [facility]. Auth# [id number], valid 09/28/20-10/27/20.

## 2020-08-30 LAB — PROTEIN ELECTROPHORESIS, SERUM
A/G Ratio: 1.1 (ref 0.7–1.7)
Albumin ELP: 4.4 g/dL (ref 2.9–4.4)
Alpha-1-Globulin: 0.2 g/dL (ref 0.0–0.4)
Alpha-2-Globulin: 0.7 g/dL (ref 0.4–1.0)
Beta Globulin: 1.3 g/dL (ref 0.7–1.3)
Gamma Globulin: 1.7 g/dL (ref 0.4–1.8)
Globulin, Total: 4 g/dL — ABNORMAL HIGH (ref 2.2–3.9)
Total Protein ELP: 8.4 g/dL (ref 6.0–8.5)

## 2020-08-31 ENCOUNTER — Other Ambulatory Visit: Payer: Self-pay

## 2020-08-31 ENCOUNTER — Inpatient Hospital Stay (HOSPITAL_COMMUNITY): Payer: Medicare HMO

## 2020-08-31 VITALS — BP 120/67 | HR 72 | Temp 97.0°F | Resp 18

## 2020-08-31 DIAGNOSIS — D500 Iron deficiency anemia secondary to blood loss (chronic): Secondary | ICD-10-CM

## 2020-08-31 DIAGNOSIS — D509 Iron deficiency anemia, unspecified: Secondary | ICD-10-CM | POA: Diagnosis not present

## 2020-08-31 LAB — COPPER, SERUM: Copper: 170 ug/dL — ABNORMAL HIGH (ref 80–158)

## 2020-08-31 MED ORDER — SODIUM CHLORIDE 0.9 % IV SOLN: Freq: Once | INTRAVENOUS | Status: AC

## 2020-08-31 MED ORDER — ACETAMINOPHEN 325 MG PO TABS
ORAL_TABLET | ORAL | Status: AC
  Filled 2020-08-31: qty 2

## 2020-08-31 MED ORDER — DIPHENHYDRAMINE HCL 25 MG PO CAPS
50.0000 mg | ORAL_CAPSULE | Freq: Once | ORAL | Status: AC
  Administered 2020-08-31: 50 mg via ORAL

## 2020-08-31 MED ORDER — ACETAMINOPHEN 325 MG PO TABS
650.0000 mg | ORAL_TABLET | Freq: Once | ORAL | Status: AC
  Administered 2020-08-31: 650 mg via ORAL

## 2020-08-31 MED ORDER — METHYLPREDNISOLONE SODIUM SUCC 125 MG IJ SOLR
INTRAMUSCULAR | Status: AC
  Filled 2020-08-31: qty 2

## 2020-08-31 MED ORDER — SODIUM CHLORIDE 0.9 % IV SOLN
300.0000 mg | Freq: Once | INTRAVENOUS | Status: AC
  Administered 2020-08-31: 300 mg via INTRAVENOUS
  Filled 2020-08-31: qty 15

## 2020-08-31 MED ORDER — METHYLPREDNISOLONE SODIUM SUCC 125 MG IJ SOLR
125.0000 mg | Freq: Once | INTRAMUSCULAR | Status: AC
  Administered 2020-08-31: 125 mg via INTRAVENOUS

## 2020-08-31 MED ORDER — DIPHENHYDRAMINE HCL 25 MG PO CAPS
ORAL_CAPSULE | ORAL | Status: AC
  Filled 2020-08-31: qty 2

## 2020-08-31 NOTE — Patient Instructions (Signed)
[facility] at [facility]  Discharge Instructions:   _______________________________________________________________  Thank you for choosing [facility] at [facility] to provide your oncology and hematology care.  To afford each patient quality time with our providers, please arrive at least 15 minutes before your scheduled appointment.  You need to re-schedule your appointment if you arrive 10 or more minutes late.  We strive to give you quality time with our providers, and arriving late affects you and other patients whose appointments are after yours.  Also, if you no show three or more times for appointments you may be dismissed from the clinic.  Again, thank you for choosing [facility] at [facility]. Our hope is that these requests will allow you access to exceptional care and in a timely manner. _______________________________________________________________  If you have questions after your visit, please contact our office at [phone] between the hours of 8:30 a.m. and 5:00 p.m. Voicemails left after 4:30 p.m. will not be returned until the following business day. _______________________________________________________________  For prescription refill requests, have your pharmacy contact our office. _______________________________________________________________  Recommendations made by the consultant and any test results will be sent to your referring physician. _______________________________________________________________ 

## 2020-08-31 NOTE — Progress Notes (Signed)
Patient was here Monday the 28th for iron infusion, no changes in meds or medical condition since then.   Iron infusion given per orders. Patient tolerated it well without problems. Vitals stable and discharged home from clinic ambulatory. Follow up as scheduled.

## 2020-09-01 LAB — METHYLMALONIC ACID, SERUM: Methylmalonic Acid, Quantitative: 90 nmol/L (ref 0–378)

## 2020-09-04 ENCOUNTER — Other Ambulatory Visit: Payer: Self-pay

## 2020-09-04 ENCOUNTER — Encounter (HOSPITAL_COMMUNITY): Payer: Self-pay

## 2020-09-04 ENCOUNTER — Inpatient Hospital Stay (HOSPITAL_COMMUNITY): Payer: Medicare HMO | Attending: Hematology

## 2020-09-04 VITALS — BP 123/72 | HR 74 | Temp 97.0°F | Resp 18

## 2020-09-04 DIAGNOSIS — D500 Iron deficiency anemia secondary to blood loss (chronic): Secondary | ICD-10-CM

## 2020-09-04 DIAGNOSIS — D509 Iron deficiency anemia, unspecified: Secondary | ICD-10-CM | POA: Insufficient documentation

## 2020-09-04 MED ORDER — SODIUM CHLORIDE 0.9 % IV SOLN
300.0000 mg | Freq: Once | INTRAVENOUS | Status: AC
  Administered 2020-09-04: 300 mg via INTRAVENOUS
  Filled 2020-09-04: qty 15

## 2020-09-04 MED ORDER — DIPHENHYDRAMINE HCL 25 MG PO CAPS
50.0000 mg | ORAL_CAPSULE | Freq: Once | ORAL | Status: AC
  Administered 2020-09-04: 25 mg via ORAL
  Filled 2020-09-04: qty 2

## 2020-09-04 MED ORDER — SODIUM CHLORIDE 0.9 % IV SOLN: Freq: Once | INTRAVENOUS | Status: AC

## 2020-09-04 MED ORDER — ACETAMINOPHEN 325 MG PO TABS
650.0000 mg | ORAL_TABLET | Freq: Once | ORAL | Status: AC
Start: 2020-09-04 — End: 2020-09-04
  Administered 2020-09-04: 650 mg via ORAL
  Filled 2020-09-04: qty 2

## 2020-09-04 MED ORDER — METHYLPREDNISOLONE SODIUM SUCC 125 MG IJ SOLR
125.0000 mg | Freq: Once | INTRAMUSCULAR | Status: AC
  Administered 2020-09-04: 125 mg via INTRAVENOUS
  Filled 2020-09-04: qty 2

## 2020-09-04 NOTE — Patient Instructions (Signed)
[facility] at [facility]  Discharge Instructions:   _______________________________________________________________  Thank you for choosing [facility] at [facility] to provide your oncology and hematology care.  To afford each patient quality time with our providers, please arrive at least 15 minutes before your scheduled appointment.  You need to re-schedule your appointment if you arrive 10 or more minutes late.  We strive to give you quality time with our providers, and arriving late affects you and other patients whose appointments are after yours.  Also, if you no show three or more times for appointments you may be dismissed from the clinic.  Again, thank you for choosing [facility] at [facility]. Our hope is that these requests will allow you access to exceptional care and in a timely manner. _______________________________________________________________  If you have questions after your visit, please contact our office at [phone] between the hours of 8:30 a.m. and 5:00 p.m. Voicemails left after 4:30 p.m. will not be returned until the following business day. _______________________________________________________________  For prescription refill requests, have your pharmacy contact our office. _______________________________________________________________  Recommendations made by the consultant and any test results will be sent to your referring physician. _______________________________________________________________ 

## 2020-09-04 NOTE — Progress Notes (Signed)
Pt states she has several days of severe fatigue after iron infusion. NP and MD made aware, no new changes. Will follow up at office visit and review labs.  Ok to give 25 mg of benadryl today per NP, at patient request.   Iron infusion given per orders. Patient tolerated it well without problems. Vitals stable and discharged home from clinic ambulatory. Follow up as scheduled.

## 2020-09-11 ENCOUNTER — Encounter: Payer: Self-pay | Admitting: Gastroenterology

## 2020-09-12 ENCOUNTER — Ambulatory Visit: Payer: Medicare HMO | Admitting: Nurse Practitioner

## 2020-09-22 ENCOUNTER — Other Ambulatory Visit (HOSPITAL_COMMUNITY): Payer: Self-pay | Admitting: *Deleted

## 2020-09-22 DIAGNOSIS — D500 Iron deficiency anemia secondary to blood loss (chronic): Secondary | ICD-10-CM

## 2020-09-25 ENCOUNTER — Other Ambulatory Visit (HOSPITAL_COMMUNITY): Payer: Self-pay | Admitting: Surgery

## 2020-09-25 ENCOUNTER — Other Ambulatory Visit: Payer: Self-pay

## 2020-09-25 ENCOUNTER — Inpatient Hospital Stay (HOSPITAL_COMMUNITY): Payer: Medicare HMO

## 2020-09-25 DIAGNOSIS — D509 Iron deficiency anemia, unspecified: Secondary | ICD-10-CM | POA: Diagnosis not present

## 2020-09-25 DIAGNOSIS — D500 Iron deficiency anemia secondary to blood loss (chronic): Secondary | ICD-10-CM

## 2020-09-25 LAB — CBC WITH DIFFERENTIAL/PLATELET
Abs Immature Granulocytes: 0 10*3/uL (ref 0.00–0.07)
Basophils Absolute: 0 10*3/uL (ref 0.0–0.1)
Basophils Relative: 1 %
Eosinophils Absolute: 0.1 10*3/uL (ref 0.0–0.5)
Eosinophils Relative: 3 %
HCT: 40.7 % (ref 36.0–46.0)
Hemoglobin: 12.6 g/dL (ref 12.0–15.0)
Immature Granulocytes: 0 %
Lymphocytes Relative: 37 %
Lymphs Abs: 1.1 10*3/uL (ref 0.7–4.0)
MCH: 24.5 pg — ABNORMAL LOW (ref 26.0–34.0)
MCHC: 31 g/dL (ref 30.0–36.0)
MCV: 79.2 fL — ABNORMAL LOW (ref 80.0–100.0)
Monocytes Absolute: 0.3 10*3/uL (ref 0.1–1.0)
Monocytes Relative: 9 %
Neutro Abs: 1.6 10*3/uL — ABNORMAL LOW (ref 1.7–7.7)
Neutrophils Relative %: 50 %
Platelets: 230 10*3/uL (ref 150–400)
RBC: 5.14 MIL/uL — ABNORMAL HIGH (ref 3.87–5.11)
WBC: 3.1 10*3/uL — ABNORMAL LOW (ref 4.0–10.5)
nRBC: 0 % (ref 0.0–0.2)

## 2020-09-25 LAB — FERRITIN: Ferritin: 29 ng/mL (ref 11–307)

## 2020-09-25 LAB — IRON AND TIBC
Iron: 57 ug/dL (ref 28–170)
Saturation Ratios: 14 % (ref 10.4–31.8)
TIBC: 416 ug/dL (ref 250–450)
UIBC: 359 ug/dL

## 2020-09-27 ENCOUNTER — Ambulatory Visit (INDEPENDENT_AMBULATORY_CARE_PROVIDER_SITE_OTHER): Payer: Medicare HMO | Admitting: "Endocrinology

## 2020-09-27 ENCOUNTER — Other Ambulatory Visit: Payer: Self-pay

## 2020-09-27 ENCOUNTER — Encounter: Payer: Self-pay | Admitting: "Endocrinology

## 2020-09-27 VITALS — BP 94/68 | HR 60 | Ht 62.0 in | Wt 169.0 lb

## 2020-09-27 DIAGNOSIS — E0520 Thyrotoxicosis with toxic multinodular goiter without thyrotoxic crisis or storm: Secondary | ICD-10-CM

## 2020-09-27 DIAGNOSIS — E042 Nontoxic multinodular goiter: Secondary | ICD-10-CM | POA: Diagnosis not present

## 2020-09-27 LAB — HGB FRACTIONATION CASCADE
Hgb A2: 2.4 % (ref 1.8–3.2)
Hgb A: 97.6 % (ref 96.4–98.8)
Hgb F: 0 % (ref 0.0–2.0)
Hgb S: 0 %

## 2020-09-27 MED ORDER — METHIMAZOLE 5 MG PO TABS: 10.0000 mg | ORAL_TABLET | Freq: Every day | ORAL | 3 refills | Status: DC

## 2020-09-27 NOTE — Progress Notes (Signed)
05/01/2020              Endocrinology follow-up note   Subjective:    Patient ID: Charlotte Mccormick, female    DOB: 09/15/1985, PCP Denyce Robert, FNP.  Past Medical History:  Diagnosis Date  . Abnormal uterine bleeding (AUB) 12/12/2015  . Anemia   . Anxiety   . Asthma   . Constipation   . Depression   . Fibroid   . Genital herpes   . GERD (gastroesophageal reflux disease)   . History of chlamydia   . Hyperemesis   . IBS (irritable bowel syndrome)   . Insomnia   . Mental disorder    PTSD, ANXIETY,Depression  . PTSD (post-traumatic stress disorder)   . Sinusitis   . Vaginal dryness 12/12/2015   Past Surgical History:  Procedure Laterality Date  . COLONOSCOPY     2016  . COLONOSCOPY N/A 04/08/2016   Procedure: COLONOSCOPY;  Surgeon: Danie Binder, MD;  [facility];  Service: Endoscopy;  Laterality: N/A;  10:15 AM  . COLONOSCOPY WITH PROPOFOL N/A 07/24/2020   Procedure: COLONOSCOPY WITH PROPOFOL;  Surgeon: [name], DO;  [facility];  Service: Endoscopy;  Laterality: N/A;  11:00am  . ESOPHAGOGASTRODUODENOSCOPY N/A 04/08/2016   Procedure: ESOPHAGOGASTRODUODENOSCOPY (EGD);  Surgeon: Danie Binder, MD;  [facility];  Service: Endoscopy;  Laterality: N/A;  . ESOPHAGOGASTRODUODENOSCOPY (EGD) WITH PROPOFOL N/A 01/17/2020   Procedure: ESOPHAGOGASTRODUODENOSCOPY (EGD) WITH PROPOFOL;  Surgeon: [name], DO;  [facility];  Service: Endoscopy;  Laterality: N/A;  8:15am  . PERINEUM REPAIR    . UPPER GASTROINTESTINAL ENDOSCOPY  2016   Social Connections: Not on file   Social History   Socioeconomic History  . Marital status: Married    Spouse name: Not on file  . Number of children: 3  . Years of education: Not on file  . Highest education level: Not on file  Occupational History  . Not on file  Tobacco Use  . Smoking status: Former Smoker    Packs/day: 0.50    Years: 5.00    Pack years: 2.50     Types: Cigarettes    Quit date: 06/03/2010    Years since quitting: 10.3  . Smokeless tobacco: Never Used  Vaping Use  . Vaping Use: Never used  Substance and Sexual Activity  . Alcohol use: Not Currently    Alcohol/week: 2.0 standard drinks    Types: 2 Glasses of wine per week    Comment: occ wine; denied 11/02/19  . Drug use: Not Currently    Types: Marijuana    Comment: 11/02/19 states she used gummies 1-2 times a month.  . Sexual activity: Yes    Birth control/protection: Pill  Other Topics Concern  . Not on file  Social History Narrative   ** Merged History Encounter **       Social Determinants of Health   Financial Resource Strain: Not on file  Food Insecurity: Not on file  Transportation Needs: Not on file  Physical Activity: Not on file  Stress: Not on file  Social Connections: Not on file  Intimate Partner Violence: Not on file   Family History  Problem Relation Age of Onset  . Heart disease Mother   . Hypertension Mother   . Hyperlipidemia Mother   . Heart attack Mother   . Heart failure Mother   . Hypertension Father   . Hyperlipidemia Father   .  Diabetes Father   . Cancer Father   . Thyroid disease Father   . Stroke Father   . Epilepsy Brother   . Eczema Daughter   . Autism spectrum disorder Son   . Colon cancer Neg Hx    Current Outpatient Medications on File Prior to Visit  Medication Sig Dispense Refill  . melatonin 3 MG TABS tablet Take 3 mg by mouth at bedtime as needed.    Marland Kitchen acetaminophen (TYLENOL) 500 MG tablet Take 500 mg by mouth every 6 (six) hours as needed for mild pain or moderate pain.    Marland Kitchen albuterol (VENTOLIN HFA) 108 (90 Base) MCG/ACT inhaler Inhale 1 puff into the lungs every 6 (six) hours as needed for wheezing or shortness of breath.    Marland Kitchen buPROPion (WELLBUTRIN XL) 300 MG 24 hr tablet Take 1 tablet (300 mg total) by mouth daily. 30 tablet 3  . dexlansoprazole (DEXILANT) 60 MG capsule Take 1 capsule (60 mg total) by mouth daily. 90  capsule 3  . diazepam (VALIUM) 5 MG tablet Take 1 tablet (5 mg total) by mouth 2 (two) times daily as needed for anxiety. 60 tablet 2  . [START ON 10/16/2020] escitalopram (LEXAPRO) 10 MG tablet Take 1 tablet (10 mg total) by mouth daily. 30 tablet 2  . linaclotide (LINZESS) 72 MCG capsule Take 1 capsule (72 mcg total) by mouth daily before breakfast. 30 capsule 5  . meloxicam (MOBIC) 15 MG tablet Take 15 mg by mouth daily as needed for pain.    . prazosin (MINIPRESS) 5 MG capsule Take 1 capsule (5 mg total) by mouth at bedtime. 30 capsule 3  . rizatriptan (MAXALT) 10 MG tablet Take 1 tablet by mouth daily as needed. At onset of headache    . VITAMIN D PO Take 1 tablet by mouth daily.    . [DISCONTINUED] LO LOESTRIN FE 1 MG-10 MCG / 10 MCG tablet Take 1 tablet by mouth daily. (Patient not taking: Reported on 10/13/2019) 3 Package 3  . [DISCONTINUED] zolpidem (AMBIEN CR) 12.5 MG CR tablet Take 1 tablet (12.5 mg total) by mouth at bedtime as needed for sleep. (Patient not taking: No sig reported) 30 tablet 2   No current facility-administered medications on file prior to visit.   Allergies  Allergen Reactions  . Bee Venom Swelling and Other (See Comments)    Reaction:  Localized swelling   . Hydrocodone Itching  . Penicillins Itching and Other (See Comments)    Has patient had a PCN reaction causing immediate rash, facial/tongue/throat swelling, SOB or lightheadedness with hypotension: No Has patient had a PCN reaction causing severe rash involving mucus membranes or skin necrosis: No Has patient had a PCN reaction that required hospitalization No Has patient had a PCN reaction occurring within the last 10 years: Yes If all of the above answers are "NO", then may proceed with Cephalosporin use.   . Reglan [Metoclopramide] Anxiety      HPI  Charlotte Mccormick is 35 y.o. 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    While she was undergoing work-up for hyperthyroidism, she was found to have multinodular goiter with 1 suspicious nodule.  She was sent for fine-needle aspiration biopsy which she underwent on May 03, 2020.  Results where significant for scant follicular epithelium, satisfactory but limited for evaluation due to scant cellularity.  Her subsequent work-up with thyroid uptake and scan  was  consistent  with areas of increased activity, overall uptake was 27%.  She did not need ablative treatment.  She was given prescription for low-dose methimazole 5 mg p.o. daily which she took more or less consistently since last visit.  Her previsit labs are consistent with inadequate response with higher levels of thyroid hormone this time.  She complains of anxiety, sleep disturbance, increased appetite, weight loss. she denies dysphagia, choking, shortness of breath, no recent voice change.  She did not have subsequent repeat ultrasound.   she has family history of thyroid dysfunction in her father, but denies family hx of thyroid cancer. she denies personal history of goiter. she is not on any anti-thyroid medications nor on any thyroid hormone supplements.                           Review of systems     Objective:    Vitals with BMI 09/27/2020 09/04/2020 09/04/2020  Height 5\' 2"  - -  Weight 169 lbs - -  BMI 99.8 - -  Systolic 94 338 250  Diastolic 68 72 75  Pulse 60 74 80  Some encounter information is confidential and restricted. Go to Review Flowsheets activity to see all data.                                          Physical exam  Recent Results (from the past 2160 hour(s))  CBC with Differential/Platelet     Status: None   Collection Time: 07/11/20 12:10 PM  Result Value Ref Range   WBC CANCELED     Comment: TEST NOT PERFORMED . No lavender-top tube received.  Result canceled by the ancillary.   Ferritin     Status: None   Collection Time: 07/11/20 12:10 PM  Result  Value Ref Range   Ferritin CANCELED     Comment: TEST NOT PERFORMED . No serum received.  Result canceled by the ancillary.   TSH     Status: Abnormal   Collection Time: 07/20/20  3:27 PM  Result Value Ref Range   TSH 0.130 (L) 0.450 - 4.500 uIU/mL  T4, free     Status: None   Collection Time: 07/20/20  3:27 PM  Result Value Ref Range   Free T4 1.18 0.82 - 1.77 ng/dL  T3, free     Status: Abnormal   Collection Time: 07/20/20  3:27 PM  Result Value Ref Range   T3, Free 4.8 (H) 2.0 - 4.4 pg/mL  CBC with Differential/Platelet     Status: Abnormal   Collection Time: 07/20/20  3:30 PM  Result Value Ref Range   WBC 3.3 (L) 3.4 - 10.8 x10E3/uL   RBC 4.55 3.77 - 5.28 x10E6/uL   Hemoglobin 8.6 (L) 11.1 - 15.9 g/dL   Hematocrit 30.2 (L) 34.0 - 46.6 %   MCV 66 (L) 79 - 97 fL   MCH 18.9 (L) 26.6 - 33.0 pg   MCHC 28.5 (L) 31.5 - 35.7 g/dL   RDW 16.9 (H) 11.7 - 15.4 %   Platelets 255 150 - 450 x10E3/uL   Neutrophils 43 Not Estab. %   Lymphs 43 Not Estab. %   Monocytes 12 Not Estab. %   Eos 1 Not Estab. %   Basos 1 Not Estab. %   Neutrophils Absolute 1.4 1.4 - 7.0 x10E3/uL   Lymphocytes Absolute 1.4  0.7 - 3.1 x10E3/uL   Monocytes Absolute 0.4 0.1 - 0.9 x10E3/uL   EOS (ABSOLUTE) 0.0 0.0 - 0.4 x10E3/uL   Basophils Absolute 0.0 0.0 - 0.2 x10E3/uL   Immature Granulocytes 0 Not Estab. %   Immature Grans (Abs) 0.0 0.0 - 0.1 x10E3/uL  Ferritin     Status: Abnormal   Collection Time: 07/20/20  3:30 PM  Result Value Ref Range   Ferritin 5 (L) 15 - 150 ng/mL  SARS CORONAVIRUS 2 (TAT 6-24 HRS) Nasopharyngeal Nasopharyngeal Swab     Status: None   Collection Time: 07/21/20  4:23 PM   Specimen: Nasopharyngeal Swab  Result Value Ref Range   SARS Coronavirus 2 NEGATIVE NEGATIVE    Comment: (NOTE) SARS-CoV-2 target nucleic acids are NOT DETECTED.  The SARS-CoV-2 RNA is generally detectable in upper and lower respiratory specimens during the acute phase of infection. Negative results do not  preclude SARS-CoV-2 infection, do not rule out co-infections with other pathogens, and should not be used as the sole basis for treatment or other patient management decisions. Negative results must be combined with clinical observations, patient history, and epidemiological information. The expected result is Negative.  Fact Sheet for Patients: SugarRoll.be  Fact Sheet for Healthcare Providers: https://www.woods-mathews.com/  This test is not yet approved or cleared by the [place] FDA and  has been authorized for detection and/or diagnosis of SARS-CoV-2 by FDA under an Emergency Use Authorization (EUA). This EUA will remain  in effect (meaning this test can be used) for the duration of the COVID-19 declaration under Se ction 564(b)(1) of the Act, 21 U.S.C. section 360bbb-3(b)(1), unless the authorization is terminated or revoked sooner.  Performed at [facility], [place] [address] [place], [place] [address]   Pregnancy, urine     Status: None   Collection Time: 07/24/20  9:25 AM  Result Value Ref Range   Preg Test, Ur NEGATIVE NEGATIVE    Comment:        THE SENSITIVITY OF THIS METHODOLOGY IS >20 mIU/mL. Performed at [facility] [address] [place], [place] [address]   CBC with Differential/Platelet     Status: Abnormal   Collection Time: 08/15/20  3:48 PM  Result Value Ref Range   WBC 3.6 3.4 - 10.8 x10E3/uL   RBC 4.56 3.77 - 5.28 x10E6/uL   Hemoglobin 8.5 (L) 11.1 - 15.9 g/dL   Hematocrit 30.3 (L) 34.0 - 46.6 %   MCV 66 (L) 79 - 97 fL   MCH 18.6 (L) 26.6 - 33.0 pg   MCHC 28.1 (L) 31.5 - 35.7 g/dL   RDW 18.1 (H) 11.7 - 15.4 %   Platelets 319 150 - 450 x10E3/uL   Neutrophils 52 Not Estab. %   Lymphs 34 Not Estab. %   Monocytes 12 Not Estab. %   Eos 1 Not Estab. %   Basos 1 Not Estab. %   Neutrophils Absolute 1.9 1.4 - 7.0 x10E3/uL   Lymphocytes Absolute 1.2 0.7 - 3.1 x10E3/uL   Monocytes Absolute 0.4 0.1 -  0.9 x10E3/uL   EOS (ABSOLUTE) 0.0 0.0 - 0.4 x10E3/uL   Basophils Absolute 0.1 0.0 - 0.2 x10E3/uL   Immature Granulocytes 0 Not Estab. %   Immature Grans (Abs) 0.0 0.0 - 0.1 x10E3/uL  Kappa/lambda light chains     Status: Abnormal   Collection Time: 08/28/20  1:37 PM  Result Value Ref Range   Kappa free light chain 21.0 (H) 3.3 - 19.4 mg/L   Lamda free  light chains 18.6 5.7 - 26.3 mg/L   Kappa, lamda light chain ratio 1.13 0.26 - 1.65    Comment: (NOTE) Performed At: [facility] [place], [place] [id number] Rush Farmer MD [id number]   VITAMIN D 25 Hydroxy (Vit-D Deficiency, Fractures)     Status: Abnormal   Collection Time: 08/28/20  1:37 PM  Result Value Ref Range   Vit D, 25-Hydroxy 23.16 (L) 30 - 100 ng/mL    Comment: (NOTE) Vitamin D deficiency has been defined by the [place] practice guideline as a level of serum 25-OH  vitamin D less than 20 ng/mL (1,2). The Endocrine Society went on to  further define vitamin D insufficiency as a level between 21 and 29  ng/mL (2).  1. IOM (Institute of Medicine). 2010. Dietary reference intakes for  calcium and D. [place]: The [employer]. 2. Holick MF, Binkley [place], Bischoff-Ferrari HA, et al. Evaluation,  treatment, and prevention of vitamin D deficiency: an Endocrine  Society clinical practice guideline, JCEM. 2011 Jul; 96(7): 1911-30.  Performed at [facility], [place] [address] [place], [place] [address]   Protein electrophoresis, serum     Status: Abnormal   Collection Time: 08/28/20  1:37 PM  Result Value Ref Range   Total Protein ELP 8.4 6.0 - 8.5 g/dL   Albumin ELP 4.4 2.9 - 4.4 g/dL   Alpha-1-Globulin 0.2 0.0 - 0.4 g/dL   Alpha-2-Globulin 0.7 0.4 - 1.0 g/dL   Beta Globulin 1.3 0.7 - 1.3 g/dL   Gamma Globulin 1.7 0.4 - 1.8 g/dL   M-Spike, % Not Observed Not Observed g/dL   SPE Interp. Comment     Comment: (NOTE) The SPE pattern  appears unremarkable. Evidence of monoclonal protein is not apparent. Performed At: [facility] [place], [place] [id number] Rush Farmer MD [id number]    Comment Comment     Comment: (NOTE) Protein electrophoresis scan will follow via computer, mail, or courier delivery.    Globulin, Total 4.0 (H) 2.2 - 3.9 g/dL   A/G Ratio 1.1 0.7 - 1.7  Vitamin B12     Status: None   Collection Time: 08/28/20  1:37 PM  Result Value Ref Range   Vitamin B-12 517 180 - 914 pg/mL    Comment: (NOTE) This assay is not validated for testing neonatal or myeloproliferative syndrome specimens for Vitamin B12 levels. Performed at [facility], [address], [place], [place] [address]   Folate     Status: None   Collection Time: 08/28/20  1:37 PM  Result Value Ref Range   Folate 19.4 >5.9 ng/mL    Comment: Performed at [facility], [address] [place],  [address]  Copper, serum     Status: Abnormal   Collection Time: 08/28/20  1:37 PM  Result Value Ref Range   Copper 170 (H) 80 - 158 ug/dL    Comment: (NOTE) This test was developed and its performance characteristics determined by [facility]. It has not been cleared or approved by the Food and Drug Administration.                                Detection Limit = 5 Performed At: [facility] [address] [place], [place] [id number] Rush Farmer MD [id number]   Methylmalonic acid, serum     Status: None   Collection Time: 08/28/20  1:37 PM  Result Value Ref Range   Methylmalonic Acid, Quantitative 90 0 - 378 nmol/L    Comment: (NOTE) This test was developed and its performance characteristics determined by [facility]. It has not been cleared or approved by the Food and Drug Administration. Performed At: [facility] [place], [place] [id number] Rush Farmer MD [id number]   Lactate dehydrogenase     Status: None   Collection Time: 08/28/20  1:37 PM  Result Value Ref  Range   LDH 147 98 - 192 U/L    Comment: Performed at [facility], [address] [place], [place] [address]  Haptoglobin     Status: None   Collection Time: 08/28/20  1:37 PM  Result Value Ref Range   Haptoglobin 167 33 - 278 mg/dL    Comment: (NOTE) Performed At: [facility] [place], [place] [id number] Rush Farmer MD [id number]   Ferritin     Status: None   Collection Time: 09/25/20 10:52 AM  Result Value Ref Range   Ferritin 29 11 - 307 ng/mL    Comment: Performed at [facility], [address], [place], [place] [address]  Iron and TIBC     Status: None   Collection Time: 09/25/20 10:52 AM  Result Value Ref Range   Iron 57 28 - 170 ug/dL   TIBC 416 250 - 450 ug/dL   Saturation Ratios 14 10.4 - 31.8 %   UIBC 359 ug/dL    Comment: Performed at [facility], [address] [place], [place] [address]  CBC with Differential/Platelet     Status: Abnormal   Collection Time: 09/25/20 10:53 AM  Result Value Ref Range   WBC 3.1 (L) 4.0 - 10.5 K/uL   RBC 5.14 (H) 3.87 - 5.11 MIL/uL   Hemoglobin 12.6 12.0 - 15.0 g/dL   HCT 40.7 36.0 - 46.0 %   MCV 79.2 (L) 80.0 - 100.0 fL   MCH 24.5 (L) 26.0 - 34.0 pg   MCHC 31.0 30.0 - 36.0 g/dL   RDW Not Measured 11.5 - 15.5 %   Platelets 230 150 - 400 K/uL   nRBC 0.0 0.0 - 0.2 %   Neutrophils Relative % 50 %   Neutro Abs 1.6 (L) 1.7 - 7.7 K/uL   Lymphocytes Relative 37 %   Lymphs Abs 1.1 0.7 - 4.0 K/uL   Monocytes Relative 9 %   Monocytes Absolute 0.3 0.1 - 1.0 K/uL   Eosinophils Relative 3 %   Eosinophils Absolute 0.1 0.0 - 0.5 K/uL   Basophils Relative 1 %   Basophils Absolute 0.0 0.0 - 0.1 K/uL   Immature Granulocytes 0 %   Abs Immature Granulocytes 0.00 0.00 - 0.07 K/uL   Dimorphism PRESENT    Polychromasia PRESENT    Target Cells PRESENT     Comment: Performed at [facility], [address], [place], [place] [address]  Hgb Fractionation Cascade     Status: None   Collection Time: 09/25/20 10:53 AM   Result Value Ref Range   Hgb F 0.0 0.0 - 2.0 %   Hgb A 97.6 96.4 - 98.8 %   Hgb A2 2.4 1.8 - 3.2 %   Hgb S 0.0 0.0 %   Interpretation, Hgb Fract Comment     Comment: (NOTE) Normal hemoglobin present; no hemoglobin variant or beta thalassemia identified. Note: Alpha thalassemia may not be detected by the Hgb Fractionation Cascade panel. If alpha thalassemia is suspected, [facility] offers Alpha-Thalassemia DNA  Analysis [phone]). Performed At: [facility] [place], [place] [id number] Rush Farmer MD [id number]        Thyroid uptake and scan on February 03, 2020 FINDINGS: Multinodular appearing thyroid lobes.  Dominant warm nodules at mid inferior RIGHT thyroid lobe with additional smaller warm nodules at the superior thyroid lobes bilaterally.  Small cold nodule seen mid to upper RIGHT lobe and mid LEFT lobe.  4 hour I-123 uptake = 13.0% (normal 5-20%),   24 hour I-123 uptake = 27.2% (normal 10-30%)  IMPRESSION: Multinodular thyroid gland.   Normal 4 hour and 24 hour radio iodine uptakes.    Thyroid ultrasound April 26, 2020 IMPRESSION: 1. Multinodular goiter. 2. Solid nodule in the mid right thyroid measuring up to 1.2 cm (labeled 3) highly suspicious for malignancy and meets criteria for tissue sampling (TI-RADS category 5). This nodule may correspond to the right mid to superior cold nodule described on recent nuclear medicine study. Recommend ultrasound-guided fine-needle aspiration. 3. Nodules labeled 2 (right superior), 5 (left superior), and 6 (left mid) meet criteria for annual ultrasound surveillance.    Fine-needle aspiration of a thyroid nodule on May 03, 2020 FINAL MICROSCOPIC DIAGNOSIS:  - Scant follicular epithelium present (Bethesda category I)   SPECIMEN ADEQUACY:  Satisfactory but limited for evaluation, scant cellularity   GROSS:  Received is/are 3 slides in 95% Ethyl alcohol, 3 air dried slides for   diff stain, and 30 ccs of clear colorless Cytolyt solution. (CM:cm)  Prepared:  Smears: 6  Concentration Method (Thin Prep): 1  Cell Block: Cell block attempted, not obtained.      Assessment & Plan:   1. Subclinical hyperthyroidism  2. MNG Her biopsy results are benign. Her repeat previsit complete thyroid function tests are consistent with inadequate treatment response with methimazole 5 mg.  Her prior 24-hour thyroid uptake was 27.2% with areas of increased uptake consistent with multinodular goiter.   Her previsit thyroid hormone levels are significantly higher than before.  She was given option of I-131 ablation, however she wants to try higher dose of methimazole which is an acceptable option for now.  I discussed and increase her methimazole to 10 mg p.o. daily with plan to repeat thyroid function test in 3 months.  She will also need repeat thyroid ultrasound to monitor the nodular size.  If her nodular features change, she may need repeat fine-needle aspiration. If her response to methimazole is not adequate, she will be considered for I-131 thyroid ablation in the future, especially if she ended up not requiring thyroidectomy.  Side effects and precautions of methimazole were discussed with her.    -Patient is advised to maintain close follow up with Denyce Robert, FNP for primary care needs.   I spent 31 minutes in the care of the patient today including review of labs from Thyroid Function, CMP, and other relevant labs ; imaging/biopsy records (current and previous including abstractions from other facilities); face-to-face time discussing  her lab results and symptoms, medications doses, her options of short and long term treatment based on the latest standards of care / guidelines;   and documenting the encounter.  Nakiah Shields-Lanier  participated in the discussions, expressed understanding, and voiced agreement with the above plans.  All questions were answered  to her satisfaction. she is encouraged to contact clinic should she have any questions or concerns prior to her return visit.    Follow up plan: Return in about 2 weeks (around 05/15/2020) for F/U with Biopsy  Results.   Thank you for involving me in the care of this pleasant patient, and I will continue to update you with her progress.  Glade Lloyd, MD [facility] Phone: [phone]  Fax: [phone]   05/01/2020, 12:00 PM  This note was partially dictated with voice recognition software. Similar sounding words can be transcribed inadequately or may not  be corrected upon review.

## 2020-09-28 ENCOUNTER — Ambulatory Visit: Payer: Medicare HMO | Admitting: Nurse Practitioner

## 2020-09-28 ENCOUNTER — Ambulatory Visit (HOSPITAL_COMMUNITY)
Admission: RE | Admit: 2020-09-28 | Discharge: 2020-09-28 | Disposition: A | Discharge status: Home / Self Care | Payer: Medicare HMO | Attending: Internal Medicine | Admitting: Internal Medicine

## 2020-09-28 ENCOUNTER — Telehealth (HOSPITAL_COMMUNITY): Payer: Medicare HMO | Admitting: Physician Assistant

## 2020-09-28 ENCOUNTER — Encounter (HOSPITAL_COMMUNITY)
Admission: RE | Disposition: A | Discharge status: Home / Self Care | Payer: Self-pay | Source: Home / Self Care | Attending: Internal Medicine

## 2020-09-28 DIAGNOSIS — D509 Iron deficiency anemia, unspecified: Secondary | ICD-10-CM | POA: Insufficient documentation

## 2020-09-28 DIAGNOSIS — D500 Iron deficiency anemia secondary to blood loss (chronic): Secondary | ICD-10-CM | POA: Diagnosis not present

## 2020-09-28 HISTORY — PX: GIVENS CAPSULE STUDY: SHX5432

## 2020-09-28 SURGERY — IMAGING PROCEDURE, GI TRACT, INTRALUMINAL, VIA CAPSULE
Anesthesia: Monitor Anesthesia Care

## 2020-09-29 ENCOUNTER — Encounter (HOSPITAL_COMMUNITY): Payer: Self-pay | Admitting: Internal Medicine

## 2020-10-02 ENCOUNTER — Telehealth: Payer: Self-pay | Admitting: Internal Medicine

## 2020-10-02 NOTE — Telephone Encounter (Signed)
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

## 2020-10-02 NOTE — Telephone Encounter (Signed)
FYI: this pt phoned this morning to say she has not passed the capsule given at the study. I asked her how do she know because if you have had a BM it would have been gone already. The pt stated she didn't know since she has not taken her Linzess since Thursday. I advised her to take her Linzess and do not sit around waiting until she thinks the capsule will pass. Pt has agreed to take her Linzess. Also advised when her givens study is given to me I will report back to her.

## 2020-10-02 NOTE — Telephone Encounter (Signed)
noted 

## 2020-10-05 ENCOUNTER — Encounter: Payer: Self-pay | Admitting: Internal Medicine

## 2020-10-06 ENCOUNTER — Inpatient Hospital Stay (HOSPITAL_COMMUNITY): Payer: Medicare HMO | Attending: Hematology | Admitting: Physician Assistant

## 2020-10-06 ENCOUNTER — Telehealth: Payer: Self-pay | Admitting: Gastroenterology

## 2020-10-06 ENCOUNTER — Other Ambulatory Visit: Payer: Self-pay

## 2020-10-06 DIAGNOSIS — D500 Iron deficiency anemia secondary to blood loss (chronic): Secondary | ICD-10-CM

## 2020-10-06 DIAGNOSIS — D509 Iron deficiency anemia, unspecified: Secondary | ICD-10-CM | POA: Insufficient documentation

## 2020-10-06 NOTE — Progress Notes (Signed)
Virtual Visit via Telephone Note [facility]  I connected with@ on 10/06/20  at  11:15 AM  by telephone and verified that I am speaking with the correct person using two identifiers.  Location: Patient: Home Provider: [facility]   I discussed the limitations, risks, security and privacy concerns of performing an evaluation and management service by telephone and the availability of in person appointments. I also discussed with the patient that there may be a patient responsible charge related to this service. The patient expressed understanding and agreed to proceed.   History of Present Illness: [name] is contacted for follow-up of her iron-deficiency anemia. She was last seen in office by Tarri Abernethy PA-C and Dr. Delton Coombes on 08/28/2020 for initial consultation.  She has longstanding iron deficiency anemia and has been unable to tolerate oral iron supplementation due to severe constipation.    She received IV Venofer 300 mg x 3.  She reports that she experienced 2 to 3 days of worsened fatigue and malaise after each iron infusion, but then returned to her baseline.  She has not noted any improvement in her chronic fatigue.  She remains extremely fatigued and struggles to keep up with daily tasks.  She has a history of intermittent black stools several months ago, but has not had any recent melena or hematochezia.  She continues to have heavy periods.  She is also being seen by Dr. 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  She is having associated symptoms including heat sensitivity, fatigue (as above), constipation, headaches, and difficulty sleeping.    Observations/Objective: Review of Systems  Constitutional: Positive for malaise/fatigue. Negative for chills, diaphoresis, fever and weight loss.  Respiratory: Negative for cough and shortness of breath.   Cardiovascular: Negative for chest pain, palpitations and leg  swelling.  Gastrointestinal: Positive for constipation. Negative for abdominal pain, blood in stool, diarrhea, melena, nausea and vomiting.  Neurological: Positive for headaches.  Psychiatric/Behavioral: The patient has insomnia.     PHYSICAL EXAM (per limitations of virtual telephone visit): The patient is alert and oriented x 3, exhibiting adequate mentation, good mood, and ability to speak in full sentences and execute sound judgement.   Assessment: 1.  Microcytic anemia with iron deficiency -Longstanding history of anemia, secondary to menorrhagia and possible occult GI blood loss -EGD was 01/17/2020 which showed gastritis without clear signs of bleeding -Colonoscopy on 07/24/2020 showed nonbleeding internal hemorrhoids and diverticulosis, but was negative for signs of bleeding. -Capsule study in April 2022 was unremarkable - Other causes for anemia were investigated, but unremarkable (normal SPEP, haptoglobin, B12, folate, copper, LDH) -Failed oral iron therapy due to severe constipation and lack of improvement -Denies any GI blood loss within the past 6 months -Symptomatic anemia with significant fatigue -CBC (08/15/2020) showed significant anemia with Hgb 8.5, microcytic with MCV 66 -Received IV Venofer x3 (given 09/04/2020), with improvement in Hgb to 12.6 and MCV 79.2 (09/25/2020) - Iron storage improved after iron infusion with ferritin 29 (09/25/2020), but still on the lower side due to utilization of iron stores to replete blood supply  2.  Vitamin D deficiency - Vitamin D level low at 23.16 (09/25/2020) - This is being managed by her primary care provider, and patient was already instructed to increase her vitamin D3 supplementation to 5000 units daily  3.  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 Social and family history -Patient's father has cancer of unknown type. He is also anemic, has received multiple blood transfusions over the past year. -Patient is a lifelong non-smoker. Does not use drugs or alcohol   Plan: 1.  Microcytic anemia with iron deficiency -Anemia likely secondary to longstanding iron deficiency from menorrhagia and possible GI blood loss.   -Received IV Venofer x3 with improvement in Hgb from 8.5 to 12.9  - Iron storage improved after iron infusion with ferritin 29 (09/25/2020), but still on the lower side due to utilization of iron stores to replete blood supply - Due to severe persistent fatigue, recommend additional IV Venofer 400 mg x 2 doses -Patient should continue to follow-up with GI for ongoing work-up of source of blood loss -Recommend workup with gynecologist for menorrhagia -Repeat CBC and iron study with RTC in 3 months  2.  Vitamin D deficiency - Continue vitamin D supplementation with 5000 units daily as prescribed by primary care provider  3.  Neutropenia, mild - Repeat CBC and RTC in 3 months  4. Severe fatigue -Recommend another treatment with IV iron due to severe fatigue and persistent iron deficiency (ferritin 29) -Fatigue may be multifactorial, likely also related to thyroid disease (toxic multinodular goiter - follows with Dr. Dorris Fetch)   Follow Up Instructions: -IV Venofer 400 mg x 2 doses - Repeat labs and RTC in 3 months    I discussed the assessment and treatment plan with the patient. The patient was provided an opportunity to ask questions and all were answered. The patient agreed with the plan and demonstrated an understanding of the instructions.   The patient was advised to call back or seek an in-person evaluation if the symptoms worsen or if the condition fails to improve as anticipated.  I provided 15 minutes of non-face-to-face time during this encounter.   Harriett Rush, PA-C

## 2020-10-06 NOTE — Op Note (Signed)
   Small Bowel [place] Capsule Study Procedure date:  09/28/20  Referring Provider:  Elon Alas. Abbey Chatters, DO PCP:  Dr. Denyce Robert, FNP  Indication for procedure: Iron deficiency anemia.  Colonoscopy February 2022 showed diverticulosis, nonbleeding internal hemorrhoids, normal terminal ileum. EGD August 2021 showed gastritis (mild nonspecific reactive gastropathy and gastric oxyntic mucosa with no specific histopathological changes, negative for H. pylori), normal duodenum with normal biopsies, mild Schatzki ring, esophageal biopsies with no specific histopathologic changes.   Patient data:  Wt: 166 pounds Ht: 5 feet 2 inches  Findings: Patient swallowed capsule without any difficulty.  Capsule reached cecum at 2 hours 25 minutes and 5 seconds.  Prep was good.  Few red specks ?blood versus food debris noted in the stomach, unimpressive and no evidence of overt bleeding.  At 2 hours 20 minutes 50 seconds likely tiny amount of blood noted but no actively bleeding lesion identified. Finding not impressive and likely insignificant.  First Gastric image: 30 seconds First Duodenal image: 57 minutes and 43 seconds First Ileo-Cecal Valve image: 2 hours 24 minutes 5 seconds First Cecal image: 2 hours 25 minutes 5 seconds Gastric Passage time: 57 minutes  Small Bowel Passage time: 1 hour 27 minutes  Summary & Recommendations: Essentially unremarkable small bowel capsule study.  No actively bleeding sites noted, no masses or other abnormalities noted.  As noted above, few specks of possible blood versus food debris, likely insignificant.  Nothing to explain degree of her iron deficiency anemia.  1. Recommend she continues to follow with hematology, iron infusions as needed. 2. Continue Dexilant 60mg  daily. 3. Limit NSAIDs/ASA as much as possible ie meloxicam, naproxen, ibuprofen, aspirin powders. 4. Return to see Dr. Abbey Chatters in the office in 3 months.

## 2020-10-06 NOTE — Telephone Encounter (Signed)
Please let patient know her small bowel capsule results as below.   Essentially unremarkable small bowel capsule study.  No actively bleeding sites noted, no masses or other abnormalities noted.  As noted above, few specks of possible blood versus food debris, likely insignificant.  Nothing to explain degree of her iron deficiency anemia.  1. Recommend she continues to follow with hematology, iron infusions as needed. 2. Continue Dexilant 60mg  daily. 3. Limit NSAIDs/ASA as much as possible ie meloxicam, naproxen, ibuprofen, aspirin powders. 4. Return to see Dr. Abbey Chatters in the office in 3 months. 5. Small bowel capsule passed into the colon, she does not need to look for it to pass, no follow-up x-ray needed.   PLEASE GIVE INFO ABOVE TO PATIENT  PLEASE MAKE FOLLOW UP OV WITH [facility] IN 3 MONTHS.

## 2020-10-09 NOTE — Telephone Encounter (Signed)
FYI: phoned and advised of the results of study and recommendations. Pt starts her iron infusions this Thursday and I advised her I am still working on her PA for Daniel (sent in ov notes this morning). Limit NSAIDs/ASA as much as possible. Pt follow up with Dr. Abbey Chatters 3 months and Manuela Schwartz from here will contact her.

## 2020-10-10 ENCOUNTER — Ambulatory Visit: Payer: Medicare HMO | Admitting: Nurse Practitioner

## 2020-10-12 ENCOUNTER — Inpatient Hospital Stay (HOSPITAL_COMMUNITY): Payer: Medicare HMO

## 2020-10-12 MED ORDER — LORATADINE 10 MG PO TABS
ORAL_TABLET | ORAL | Status: AC
  Filled 2020-10-12: qty 1

## 2020-10-12 MED ORDER — ACETAMINOPHEN 325 MG PO TABS
ORAL_TABLET | ORAL | Status: AC
  Filled 2020-10-12: qty 2

## 2020-10-12 NOTE — Patient Instructions (Signed)
[facility]  Discharge Instructions: Thank you for choosing [facility] to provide your oncology and hematology care.  If you have a lab appointment with the Cancer Center, please come in thru the Main Entrance and check in at the main information desk.  Wear comfortable clothing and clothing appropriate for easy access to any Portacath or PICC line.   We strive to give you quality time with your provider. You may need to reschedule your appointment if you arrive late (15 or more minutes).  Arriving late affects you and other patients whose appointments are after yours.  Also, if you miss three or more appointments without notifying the office, you may be dismissed from the clinic at the provider's discretion.      For prescription refill requests, have your pharmacy contact our office and allow 72 hours for refills to be completed.        To help prevent nausea and vomiting after your treatment, we encourage you to take your nausea medication as directed.  BELOW ARE SYMPTOMS THAT SHOULD BE REPORTED IMMEDIATELY: *FEVER GREATER THAN 100.4 F (38 C) OR HIGHER *CHILLS OR SWEATING *NAUSEA AND VOMITING THAT IS NOT CONTROLLED WITH YOUR NAUSEA MEDICATION *UNUSUAL SHORTNESS OF BREATH *UNUSUAL BRUISING OR BLEEDING *URINARY PROBLEMS (pain or burning when urinating, or frequent urination) *BOWEL PROBLEMS (unusual diarrhea, constipation, pain near the anus) TENDERNESS IN MOUTH AND THROAT WITH OR WITHOUT PRESENCE OF ULCERS (sore throat, sores in mouth, or a toothache) UNUSUAL RASH, SWELLING OR PAIN  UNUSUAL VAGINAL DISCHARGE OR ITCHING   Items with * indicate a potential emergency and should be followed up as soon as possible or go to the Emergency Department if any problems should occur.  Please show the CHEMOTHERAPY ALERT CARD or IMMUNOTHERAPY ALERT CARD at check-in to the Emergency Department and triage nurse.  Should you have questions after your visit or need to cancel  or reschedule your appointment, please contact [facility] [phone]  and follow the prompts.  Office hours are 8:00 a.m. to 4:30 p.m. Monday - Friday. Please note that voicemails left after 4:00 p.m. may not be returned until the following business day.  We are closed weekends and major holidays. You have access to a nurse at all times for urgent questions. Please call the main number to the clinic [phone] and follow the prompts.  For any non-urgent questions, you may also contact your provider using MyChart. We now offer e-Visits for anyone 18 and older to request care online for non-urgent symptoms. For details visit [email or web address].   Also download the MyChart app! Go to the app store, search "MyChart", open the app, select [facility], and log in with your MyChart username and password.  Due to Covid, a mask is required upon entering the hospital/clinic. If you do not have a mask, one will be given to you upon arrival. For doctor visits, patients may have 1 support person aged 18 or older with them. For treatment visits, patients cannot have anyone with them due to current Covid guidelines and our immunocompromised population.  

## 2020-10-12 NOTE — Progress Notes (Signed)
Patient presents today for Venofer infusion.  Vital signs WNL.  Patient has no new complaints.    Patient stuck multiple times for IV access and attempts were unsuccessful.  Patient wants to reschedule so she can be better hydrated.     No complaints at this time.  Discharge from clinic ambulatory in stable condition.  Alert and oriented X 3.  Follow up with [facility] as scheduled.

## 2020-10-13 NOTE — Telephone Encounter (Signed)
On recall to see Dr Abbey Chatters in August.

## 2020-10-16 ENCOUNTER — Telehealth: Payer: Self-pay

## 2020-10-16 NOTE — Telephone Encounter (Signed)
SPENT 18 MINUTES ON THE PHONE THIS MORNING FIRST THING FOR 18 MINUTES WITH COVER MY MEDS TRYING TO GET PT'S QUESTIONS ANSWERED WHERE A MESSAGE WAS LEFT ON [facility] PHONE. TRANSFERRED TO 3 DIFFERENT PEOPLE AND NO ONE COULD HELP BECAUSE THEY DIDN'T SEE WHERE I WAS CALLED REGARDING THIS OR THEY DIDN'T SEE WHERE I HAD DID AN APPEAL. WILL TRY AND GET BACK WITH THIS LATER TODAY TO WORK ON IT MORE.

## 2020-10-16 NOTE — Telephone Encounter (Signed)
Noted  

## 2020-10-20 ENCOUNTER — Ambulatory Visit: Payer: Medicare HMO | Admitting: Gastroenterology

## 2020-10-24 ENCOUNTER — Inpatient Hospital Stay (HOSPITAL_COMMUNITY): Payer: Medicare HMO

## 2020-10-24 ENCOUNTER — Other Ambulatory Visit: Payer: Self-pay

## 2020-10-24 ENCOUNTER — Encounter (HOSPITAL_COMMUNITY): Payer: Self-pay

## 2020-10-24 VITALS — BP 123/67 | HR 83 | Temp 97.1°F | Resp 17

## 2020-10-24 DIAGNOSIS — D500 Iron deficiency anemia secondary to blood loss (chronic): Secondary | ICD-10-CM

## 2020-10-24 DIAGNOSIS — D509 Iron deficiency anemia, unspecified: Secondary | ICD-10-CM | POA: Diagnosis not present

## 2020-10-24 MED ORDER — SODIUM CHLORIDE 0.9 % IV SOLN: Freq: Once | INTRAVENOUS | Status: AC

## 2020-10-24 MED ORDER — ACETAMINOPHEN 325 MG PO TABS
ORAL_TABLET | ORAL | Status: AC
  Filled 2020-10-24: qty 2

## 2020-10-24 MED ORDER — METHYLPREDNISOLONE SODIUM SUCC 125 MG IJ SOLR
INTRAMUSCULAR | Status: AC
  Filled 2020-10-24: qty 2

## 2020-10-24 MED ORDER — PROCHLORPERAZINE EDISYLATE 10 MG/2ML IJ SOLN
10.0000 mg | Freq: Once | INTRAMUSCULAR | Status: AC
  Administered 2020-10-24: 10 mg via INTRAVENOUS

## 2020-10-24 MED ORDER — ACETAMINOPHEN 325 MG PO TABS
650.0000 mg | ORAL_TABLET | Freq: Once | ORAL | Status: AC
Start: 2020-10-24 — End: 2020-10-24
  Administered 2020-10-24: 650 mg via ORAL

## 2020-10-24 MED ORDER — LORAZEPAM 2 MG/ML IJ SOLN
1.0000 mg | Freq: Once | INTRAMUSCULAR | Status: AC
  Administered 2020-10-24: 1 mg via INTRAVENOUS

## 2020-10-24 MED ORDER — METHYLPREDNISOLONE SODIUM SUCC 125 MG IJ SOLR
125.0000 mg | Freq: Once | INTRAMUSCULAR | Status: AC
  Administered 2020-10-24: 125 mg via INTRAVENOUS

## 2020-10-24 MED ORDER — PROCHLORPERAZINE EDISYLATE 10 MG/2ML IJ SOLN
INTRAMUSCULAR | Status: AC
  Filled 2020-10-24: qty 2

## 2020-10-24 MED ORDER — LORATADINE 10 MG PO TABS
ORAL_TABLET | ORAL | Status: AC
  Filled 2020-10-24: qty 1

## 2020-10-24 MED ORDER — LORAZEPAM 2 MG/ML IJ SOLN
INTRAMUSCULAR | Status: AC
  Filled 2020-10-24: qty 1

## 2020-10-24 MED ORDER — LORATADINE 10 MG PO TABS
10.0000 mg | ORAL_TABLET | Freq: Once | ORAL | Status: AC
  Administered 2020-10-24: 10 mg via ORAL

## 2020-10-24 MED ORDER — DIPHENHYDRAMINE HCL 50 MG/ML IJ SOLN
25.0000 mg | Freq: Once | INTRAMUSCULAR | Status: AC
  Administered 2020-10-24: 25 mg via INTRAVENOUS

## 2020-10-24 MED ORDER — DIPHENHYDRAMINE HCL 50 MG/ML IJ SOLN
INTRAMUSCULAR | Status: AC
  Filled 2020-10-24: qty 1

## 2020-10-24 MED ORDER — SODIUM CHLORIDE 0.9 % IV SOLN
400.0000 mg | Freq: Once | INTRAVENOUS | Status: AC
  Administered 2020-10-24: 400 mg via INTRAVENOUS
  Filled 2020-10-24: qty 20

## 2020-10-24 NOTE — Progress Notes (Signed)
Patient presents today for Venofer 400 mg infusion. Vital signs stable. Patient denies any complaints today or side effects of the last iron. Premedications given prior to treatment. Claritin 10 mg PO, Solu-Medrol 125 mg IV, Tylenol 650 mg PO.   11:04 am  Patient vomiting, diaphoretic and states she is hot per patient's words. Venofer 400 mg IV stopped.   11:04 am Vital signs 96.9, 80, 17, 110/70, 100% 02.   11:05 am  500 ml bolus of Normal Saline initiated.   11:07 Dr. Delton Coombes and [place] PA at the bedside. Verbal order received to give 10 mg Compazine IV now and 25 mg Benadryl IV now. Assessment performed by Dr. Delton Coombes. Patient denies any shortness of breath, pain, itching, chest heaviness or swelling in the mouth or throat.   11:18 am Compazine 10 mg given IV and Benadryl 25 mg IV push.   11:30 am Patient crying, yelling, standing up from her treatment chair. Patient unable to breath per patient's words. Vital signs stable. 02 Sats 100% on room air. See Flowsheet.   11:35 am Dr. Delton Coombes at the bedside. Verbal order received to give 1 mg of Ativan IV stat.    11:35 am 1 Mg Ativan given IV push. Verbal order received to monitor patient for 1 hour and may discharge home.   Venofer given today per MD orders. Unable to tolerate entire infusion of Venofer 400 mg. Vital signs stable. No complaints at this time. Discharged from clinic via wheel chair and accompanied by CNA off the floor to private vehicle in stable condition. Alert and oriented x 3. F/U with [facility] as scheduled.

## 2020-10-24 NOTE — Patient Instructions (Signed)
[facility]  Discharge Instructions: Thank you for choosing [facility] to provide your oncology and hematology care.  If you have a lab appointment with the [facility], please come in thru the Main Entrance and check in at the main information desk.  We strive to give you quality time with your provider. You may need to reschedule your appointment if you arrive late (15 or more minutes).  Arriving late affects you and other patients whose appointments are after yours.  Also, if you miss three or more appointments without notifying the office, you may be dismissed from the clinic at the provider's discretion.      For prescription refill requests, have your pharmacy contact our office and allow 72 hours for refills to be completed.    Today you received the following Venofer.    To help prevent nausea and vomiting after your treatment, we encourage you to take your nausea medication as directed.  BELOW ARE SYMPTOMS THAT SHOULD BE REPORTED IMMEDIATELY: . *FEVER GREATER THAN 100.4 F (38 C) OR HIGHER . *CHILLS OR SWEATING . *NAUSEA AND VOMITING THAT IS NOT CONTROLLED WITH YOUR NAUSEA MEDICATION . *UNUSUAL SHORTNESS OF BREATH . *UNUSUAL BRUISING OR BLEEDING . *URINARY PROBLEMS (pain or burning when urinating, or frequent urination) . *BOWEL PROBLEMS (unusual diarrhea, constipation, pain near the anus) . TENDERNESS IN MOUTH AND THROAT WITH OR WITHOUT PRESENCE OF ULCERS (sore throat, sores in mouth, or a toothache) . UNUSUAL RASH, SWELLING OR PAIN  . UNUSUAL VAGINAL DISCHARGE OR ITCHING   Items with * indicate a potential emergency and should be followed up as soon as possible or go to the Emergency Department if any problems should occur.  Please show the CHEMOTHERAPY ALERT CARD or IMMUNOTHERAPY ALERT CARD at check-in to the Emergency Department and triage nurse.  Should you have questions after your visit or need to cancel or reschedule your appointment, please  contact [facility] [phone]  and follow the prompts.  Office hours are 8:00 a.m. to 4:30 p.m. Monday - Friday. Please note that voicemails left after 4:00 p.m. may not be returned until the following business day.  We are closed weekends and major holidays. You have access to a nurse at all times for urgent questions. Please call the main number to the clinic [phone] and follow the prompts.  For any non-urgent questions, you may also contact your provider using MyChart. We now offer e-Visits for anyone 56 and older to request care online for non-urgent symptoms. For details visit [email or web address].   Also download the MyChart app! Go to the app store, search "MyChart", open the app, select [facility] [place] [facility], and log in with your MyChart username and password.  Due to Covid, a mask is required upon entering the hospital/clinic. If you do not have a mask, one will be given to you upon arrival. For doctor visits, patients may have 1 support person aged 75 or older with them. For treatment visits, patients cannot have anyone with them due to current Covid guidelines and our immunocompromised population.

## 2020-10-24 NOTE — Progress Notes (Addendum)
As noted in progress note from [place], patient experienced mild infusion reaction today to Venofer 400 mg which she experienced vomiting and diaphoresis.  Previous doses of Venofer have caused her to feel severely fatigued for the days following the infusion.  Due to obvious intolerance of the Venofer formulation of IV iron, we will see if insurance will allow an alternative form of iron, such as IV Feraheme.  If approved, we will schedule patient for Feraheme next week with Solu-Medrol, Pepcid, and Tylenol for premedication.  We will avoid Benadryl, as patient reports that this makes her anxiety worse.

## 2020-10-25 ENCOUNTER — Telehealth (HOSPITAL_COMMUNITY): Payer: Self-pay

## 2020-10-25 ENCOUNTER — Encounter (HOSPITAL_COMMUNITY): Payer: Self-pay | Admitting: Hematology

## 2020-10-25 DIAGNOSIS — K5909 Other constipation: Secondary | ICD-10-CM

## 2020-10-25 DIAGNOSIS — K219 Gastro-esophageal reflux disease without esophagitis: Secondary | ICD-10-CM

## 2020-10-25 NOTE — Addendum Note (Signed)
Addended by: Tarri Abernethy on: 10/25/2020 04:03 PM   Modules accepted: Orders

## 2020-10-25 NOTE — Telephone Encounter (Signed)
Patient contacted today for a follow up from a slight reaction to Venofer on 10/24/2020.  Patient is no longer is having symptoms and is willing to try Feraheme next week.

## 2020-10-26 MED ORDER — LINACLOTIDE 72 MCG PO CAPS: 72.0000 ug | ORAL_CAPSULE | Freq: Every day | ORAL | 5 refills | Status: DC

## 2020-10-28 ENCOUNTER — Encounter (HOSPITAL_COMMUNITY): Payer: Self-pay | Admitting: Hematology

## 2020-11-02 ENCOUNTER — Ambulatory Visit (HOSPITAL_COMMUNITY): Payer: Medicare HMO

## 2020-11-16 ENCOUNTER — Telehealth: Payer: Self-pay | Admitting: Obstetrics & Gynecology

## 2020-11-16 ENCOUNTER — Telehealth: Payer: Self-pay

## 2020-11-16 NOTE — Telephone Encounter (Signed)
Pt sees hematology @ [facility], they recommended her to get a GYN U/S due to having 2 cycles a month   Pt states all her hematology notes are in her Epic chart & we can review them to see specialists concerns   Please advise - pt last seen here 2020

## 2020-11-20 ENCOUNTER — Inpatient Hospital Stay (HOSPITAL_COMMUNITY): Payer: Medicare HMO | Attending: Hematology

## 2020-11-20 ENCOUNTER — Other Ambulatory Visit: Payer: Self-pay

## 2020-11-20 VITALS — BP 104/69 | HR 87 | Temp 97.0°F | Resp 18

## 2020-11-20 DIAGNOSIS — D509 Iron deficiency anemia, unspecified: Secondary | ICD-10-CM | POA: Diagnosis present

## 2020-11-20 DIAGNOSIS — D500 Iron deficiency anemia secondary to blood loss (chronic): Secondary | ICD-10-CM

## 2020-11-20 MED ORDER — FAMOTIDINE 20 MG IN NS 100 ML IVPB
20.0000 mg | Freq: Once | INTRAVENOUS | Status: AC
Start: 2020-11-20 — End: 2020-11-20
  Administered 2020-11-20: 20 mg via INTRAVENOUS
  Filled 2020-11-20: qty 20

## 2020-11-20 MED ORDER — METHYLPREDNISOLONE SODIUM SUCC 125 MG IJ SOLR
INTRAMUSCULAR | Status: AC
  Filled 2020-11-20: qty 2

## 2020-11-20 MED ORDER — SODIUM CHLORIDE 0.9 % IV SOLN: Freq: Once | INTRAVENOUS | Status: AC

## 2020-11-20 MED ORDER — ACETAMINOPHEN 325 MG PO TABS
650.0000 mg | ORAL_TABLET | Freq: Once | ORAL | Status: AC
  Administered 2020-11-20: 650 mg via ORAL
  Filled 2020-11-20: qty 2

## 2020-11-20 MED ORDER — FERUMOXYTOL INJECTION 510 MG/17 ML
510.0000 mg | Freq: Once | INTRAVENOUS | Status: AC
  Administered 2020-11-20: 510 mg via INTRAVENOUS
  Filled 2020-11-20: qty 17

## 2020-11-20 MED ORDER — METHYLPREDNISOLONE SODIUM SUCC 125 MG IJ SOLR
125.0000 mg | Freq: Once | INTRAMUSCULAR | Status: AC
  Administered 2020-11-20: 125 mg via INTRAVENOUS
  Filled 2020-11-20: qty 2

## 2020-11-20 NOTE — Patient Instructions (Signed)
[facility]  Discharge Instructions: Thank you for choosing [facility] to provide your oncology and hematology care.  If you have a lab appointment with the Cancer Center, please come in thru the Main Entrance and check in at the main information desk.     We strive to give you quality time with your provider. You may need to reschedule your appointment if you arrive late (15 or more minutes).  Arriving late affects you and other patients whose appointments are after yours.  Also, if you miss three or more appointments without notifying the office, you may be dismissed from the clinic at the provider's discretion.      For prescription refill requests, have your pharmacy contact our office and allow 72 hours for refills to be completed.     Should you have questions after your visit or need to cancel or reschedule your appointment, please contact [facility] [phone]  and follow the prompts.  Office hours are 8:00 a.m. to 4:30 p.m. Monday - Friday. Please note that voicemails left after 4:00 p.m. may not be returned until the following business day.  We are closed weekends and major holidays. You have access to a nurse at all times for urgent questions. Please call the main number to the clinic [phone] and follow the prompts.  For any non-urgent questions, you may also contact your provider using MyChart. We now offer e-Visits for anyone 18 and older to request care online for non-urgent symptoms. For details visit [email or web address].   Also download the MyChart app! Go to the app store, search "MyChart", open the app, select [facility], and log in with your MyChart username and password.  Due to Covid, a mask is required upon entering the hospital/clinic. If you do not have a mask, one will be given to you upon arrival. For doctor visits, patients may have 1 support person aged 18 or older with them. For treatment visits, patients cannot have  anyone with them due to current Covid guidelines and our immunocompromised population.  

## 2020-11-30 ENCOUNTER — Other Ambulatory Visit: Payer: Self-pay

## 2020-11-30 ENCOUNTER — Inpatient Hospital Stay (HOSPITAL_COMMUNITY): Payer: Medicare HMO

## 2020-11-30 VITALS — BP 116/78 | HR 82 | Temp 97.0°F | Resp 18 | Wt 169.4 lb

## 2020-11-30 DIAGNOSIS — D509 Iron deficiency anemia, unspecified: Secondary | ICD-10-CM | POA: Diagnosis not present

## 2020-11-30 DIAGNOSIS — D500 Iron deficiency anemia secondary to blood loss (chronic): Secondary | ICD-10-CM

## 2020-11-30 MED ORDER — ACETAMINOPHEN 325 MG PO TABS
ORAL_TABLET | ORAL | Status: AC
  Filled 2020-11-30: qty 2

## 2020-11-30 MED ORDER — FAMOTIDINE 20 MG IN NS 100 ML IVPB
20.0000 mg | Freq: Once | INTRAVENOUS | Status: AC
  Administered 2020-11-30: 20 mg via INTRAVENOUS
  Filled 2020-11-30: qty 100

## 2020-11-30 MED ORDER — ACETAMINOPHEN 325 MG PO TABS
650.0000 mg | ORAL_TABLET | Freq: Once | ORAL | Status: AC
Start: 2020-11-30 — End: 2020-11-30
  Administered 2020-11-30: 650 mg via ORAL

## 2020-11-30 MED ORDER — METHYLPREDNISOLONE SODIUM SUCC 125 MG IJ SOLR
125.0000 mg | Freq: Once | INTRAMUSCULAR | Status: AC
  Administered 2020-11-30: 125 mg via INTRAVENOUS

## 2020-11-30 MED ORDER — METHYLPREDNISOLONE SODIUM SUCC 125 MG IJ SOLR
INTRAMUSCULAR | Status: AC
  Filled 2020-11-30: qty 2

## 2020-11-30 MED ORDER — SODIUM CHLORIDE 0.9 % IV SOLN: Freq: Once | INTRAVENOUS | Status: AC

## 2020-11-30 MED ORDER — SODIUM CHLORIDE 0.9 % IV SOLN
510.0000 mg | Freq: Once | INTRAVENOUS | Status: AC
Start: 2020-11-30 — End: 2020-11-30
  Administered 2020-11-30: 510 mg via INTRAVENOUS
  Filled 2020-11-30: qty 510

## 2020-11-30 NOTE — Patient Instructions (Signed)
[facility]  Discharge Instructions: Thank you for choosing [facility] to provide your oncology and hematology care.  If you have a lab appointment with the [facility], please come in thru the Main Entrance and check in at the main information desk.  Wear comfortable clothing and clothing appropriate for easy access to any Portacath or PICC line.   We strive to give you quality time with your provider. You may need to reschedule your appointment if you arrive late (15 or more minutes).  Arriving late affects you and other patients whose appointments are after yours.  Also, if you miss three or more appointments without notifying the office, you may be dismissed from the clinic at the provider's discretion.      For prescription refill requests, have your pharmacy contact our office and allow 72 hours for refills to be completed.    Feraheme infusion     BELOW ARE SYMPTOMS THAT SHOULD BE REPORTED IMMEDIATELY: *FEVER GREATER THAN 100.4 F (38 C) OR HIGHER *CHILLS OR SWEATING *NAUSEA AND VOMITING THAT IS NOT CONTROLLED WITH YOUR NAUSEA MEDICATION *UNUSUAL SHORTNESS OF BREATH *UNUSUAL BRUISING OR BLEEDING *URINARY PROBLEMS (pain or burning when urinating, or frequent urination) *BOWEL PROBLEMS (unusual diarrhea, constipation, pain near the anus) TENDERNESS IN MOUTH AND THROAT WITH OR WITHOUT PRESENCE OF ULCERS (sore throat, sores in mouth, or a toothache) UNUSUAL RASH, SWELLING OR PAIN  UNUSUAL VAGINAL DISCHARGE OR ITCHING   Items with * indicate a potential emergency and should be followed up as soon as possible or go to the Emergency Department if any problems should occur.  Please show the CHEMOTHERAPY ALERT CARD or IMMUNOTHERAPY ALERT CARD at check-in to the Emergency Department and triage nurse.  Should you have questions after your visit or need to cancel or reschedule your appointment, please contact [facility] [phone]  and follow the  prompts.  Office hours are 8:00 a.m. to 4:30 p.m. Monday - Friday. Please note that voicemails left after 4:00 p.m. may not be returned until the following business day.  We are closed weekends and major holidays. You have access to a nurse at all times for urgent questions. Please call the main number to the clinic [phone] and follow the prompts.  For any non-urgent questions, you may also contact your provider using MyChart. We now offer e-Visits for anyone 49 and older to request care online for non-urgent symptoms. For details visit [email or web address].   Also download the MyChart app! Go to the app store, search "MyChart", open the app, select [facility], and log in with your MyChart username and password.  Due to Covid, a mask is required upon entering the hospital/clinic. If you do not have a mask, one will be given to you upon arrival. For doctor visits, patients may have 1 support person aged 70 or older with them. For treatment visits, patients cannot have anyone with them due to current Covid guidelines and our immunocompromised population.

## 2020-11-30 NOTE — Progress Notes (Addendum)
Pt tolerated iron infusion well without incidence. Pt stable during and after treatment. Pt discharged ambulatory in satisfactory condition.

## 2020-12-01 ENCOUNTER — Ambulatory Visit (HOSPITAL_COMMUNITY): Payer: Medicare HMO

## 2020-12-01 ENCOUNTER — Encounter (HOSPITAL_COMMUNITY): Payer: Self-pay | Admitting: Hematology

## 2020-12-05 ENCOUNTER — Other Ambulatory Visit: Payer: Self-pay

## 2020-12-05 ENCOUNTER — Telehealth (INDEPENDENT_AMBULATORY_CARE_PROVIDER_SITE_OTHER): Payer: Medicare HMO | Admitting: Obstetrics & Gynecology

## 2020-12-05 ENCOUNTER — Encounter: Payer: Self-pay | Admitting: Obstetrics & Gynecology

## 2020-12-05 VITALS — BP 118/78 | HR 116

## 2020-12-05 DIAGNOSIS — D500 Iron deficiency anemia secondary to blood loss (chronic): Secondary | ICD-10-CM

## 2020-12-05 DIAGNOSIS — N920 Excessive and frequent menstruation with regular cycle: Secondary | ICD-10-CM

## 2020-12-05 MED ORDER — MYFEMBREE 40-1-0.5 MG PO TABS
1.0000 | ORAL_TABLET | Freq: Every day | ORAL | 11 refills | Status: DC
Start: 2020-12-05 — End: 2020-12-22

## 2020-12-05 NOTE — Progress Notes (Signed)
TELEHEALTH My Chart video VIRTUAL GYNECOLOGY VISIT ENCOUNTER NOTE  I connected with Charlotte Mccormick on 12/05/20 at  2:30 PM EDT by mychartat home and verified that I am speaking with the correct person using two identifiers.  I am in my office I discussed the limitations, risks, security and privacy concerns of performing an evaluation and management service by telephone and the availability of in person appointments. I also discussed with the patient that there may be a patient responsible charge related to this service. The patient expressed understanding and agreed to proceed.   History:  Charlotte Mccormick is a 35 y.o. [id number] female being evaluated today for evaluation of frequent periods She is having 2 periods per month Both last 5 days She has been iron deficient requiring iron infusions to maintain hemoglobin . She denies any current abnormal vaginal discharge, bleeding, pelvic pain or other concerns.       Past Medical History:  Diagnosis Date   Abnormal uterine bleeding (AUB) 12/12/2015   Anemia    Anxiety    Asthma    Constipation    Depression    Fibroid    Genital herpes    GERD (gastroesophageal reflux disease)    History of chlamydia    Hyperemesis    IBS (irritable bowel syndrome)    Insomnia    Mental disorder    PTSD, ANXIETY,Depression   PTSD (post-traumatic stress disorder)    Sinusitis    Vaginal dryness 12/12/2015   Past Surgical History:  Procedure Laterality Date   COLONOSCOPY     2016   COLONOSCOPY N/A 04/08/2016   Procedure: COLONOSCOPY;  Surgeon: Danie Binder, MD;  [facility];  Service: Endoscopy;  Laterality: N/A;  10:15 AM   COLONOSCOPY WITH PROPOFOL N/A 07/24/2020   Procedure: COLONOSCOPY WITH PROPOFOL;  Surgeon: [name], DO;  [facility];  Service: Endoscopy;  Laterality: N/A;  11:00am   ESOPHAGOGASTRODUODENOSCOPY N/A 04/08/2016   Procedure: ESOPHAGOGASTRODUODENOSCOPY (EGD);  Surgeon: Danie Binder, MD;  [facility];  Service: Endoscopy;  Laterality: N/A;   ESOPHAGOGASTRODUODENOSCOPY (EGD) WITH PROPOFOL N/A 01/17/2020   Procedure: ESOPHAGOGASTRODUODENOSCOPY (EGD) WITH PROPOFOL;  Surgeon: [name], DO;  [facility];  Service: Endoscopy;  Laterality: N/A;  8:15am   GIVENS CAPSULE STUDY N/A 09/28/2020   Procedure: GIVENS CAPSULE STUDY;  Surgeon: [name], DO;  [facility];  Service: Endoscopy;  Laterality: N/A;  7:30am   PERINEUM REPAIR     UPPER GASTROINTESTINAL ENDOSCOPY  2016   The following portions of the patient's history were reviewed and updated as appropriate: allergies, current medications, past family history, past medical history, past social history, past surgical history and problem list.   Health Maintenance:   Review of Systems:  Pertinent items noted in HPI and remainder of comprehensive ROS otherwise negative.  Physical Exam:  Physical exam deferred due to nature of the encounter  Labs and Imaging  I reviewed her labs since the beginning of the year  No results found for this or any previous visit (from the past 336 hour(s)). No results found.     Meds ordered this encounter  Medications   Relugolix-Estradiol-Norethind (MYFEMBREE) 40-1-0.5 MG TABS    Sig: Take 1 tablet by mouth daily.    Dispense:  30 tablet    Refill:  11    Orders Placed This Encounter  Procedures   US PELVIS (TRANSABDOMINAL ONLY)     Assessment and  Plan:       ICD-10-CM   1. Frequent menstruation  N92.0 US PELVIS (TRANSABDOMINAL ONLY)   Myfembree to try and stop menses, continue oral iron, sonogram 3 months and visit with me to see repsonse to myfembree and sonogram results    2. Iron deficiency anemia due to chronic blood loss  D50.0 US PELVIS (TRANSABDOMINAL ONLY)           I discussed the assessment and treatment plan with the patient. The patient was provided an opportunity to ask questions and all were answered.  The patient agreed with the plan and demonstrated an understanding of the instructions.   The patient was advised to call back or seek an in-person evaluation/go to the ED if the symptoms worsen or if the condition fails to improve as anticipated.  I provided 10 minutes of  video basednon-face-to-face time during this encounter.   Florian Buff, [place] for [facility]

## 2020-12-19 ENCOUNTER — Telehealth: Payer: Self-pay | Admitting: Obstetrics & Gynecology

## 2020-12-19 NOTE — Telephone Encounter (Signed)
Patiented called stating that she was seen by Eure on the 5th and he was suppose to have sent a script for meds to a mail order place for it to e sent to her home. She couldn't tell me what the name of it was and I couldn't verify it in her chart are you able to follow up to make sure the script was sent

## 2020-12-22 ENCOUNTER — Other Ambulatory Visit: Payer: Self-pay | Admitting: Obstetrics & Gynecology

## 2020-12-22 ENCOUNTER — Encounter (HOSPITAL_COMMUNITY): Payer: Self-pay

## 2020-12-22 MED ORDER — MYFEMBREE 40-1-0.5 MG PO TABS: 1.0000 | ORAL_TABLET | Freq: Every day | ORAL | 11 refills | Status: DC

## 2020-12-26 ENCOUNTER — Encounter: Payer: Self-pay | Admitting: *Deleted

## 2021-01-05 ENCOUNTER — Inpatient Hospital Stay (HOSPITAL_COMMUNITY): Payer: Medicare HMO

## 2021-01-08 ENCOUNTER — Other Ambulatory Visit: Payer: Self-pay

## 2021-01-08 ENCOUNTER — Inpatient Hospital Stay (HOSPITAL_COMMUNITY): Payer: Medicare HMO | Attending: Hematology

## 2021-01-08 ENCOUNTER — Other Ambulatory Visit (HOSPITAL_COMMUNITY): Payer: Self-pay

## 2021-01-08 DIAGNOSIS — D709 Neutropenia, unspecified: Secondary | ICD-10-CM | POA: Insufficient documentation

## 2021-01-08 DIAGNOSIS — D509 Iron deficiency anemia, unspecified: Secondary | ICD-10-CM | POA: Insufficient documentation

## 2021-01-08 DIAGNOSIS — E559 Vitamin D deficiency, unspecified: Secondary | ICD-10-CM | POA: Insufficient documentation

## 2021-01-08 DIAGNOSIS — D500 Iron deficiency anemia secondary to blood loss (chronic): Secondary | ICD-10-CM

## 2021-01-11 NOTE — Progress Notes (Signed)
[place], [place] [address]   CLINIC:  Medical Oncology/Hematology  PCP:  Denyce Robert, [place] [address] [phone]   REASON FOR VISIT:  Follow-up for iron deficiency anemia  CURRENT THERAPY: IV iron as needed  INTERVAL HISTORY:  Charlotte Mccormick 35 y.o. female returns for routine follow-up of her iron deficiency anemia.  She was last evaluated by Tarri Abernethy PA-C on 10/06/2020 via telemedicine visit.  Her last IV iron infusion was on 11/30/2020.  Of note, patient did have mild reaction to her Venofer on 10/24/2020.  She experienced nausea, vomiting, and diaphoresis, improved with Compazine and Benadryl.  She was given Feraheme on 11/30/2020, and received steroids before infusion; she tolerated it well without incident.  She reports that she had some nausea that day, but otherwise did not experience any adverse symptoms.  At today's visit, she reports feeling fair.  No recent hospitalizations, surgeries, or changes in baseline health status.  She reports that after her IV iron, she noticed improved energy.  She no longer has severe fatigue, dyspnea on exertion, or near syncopal episodes.  She does continue to feel "tired" but states that it is different than before.  She continues to follow-up with her gynecologist for menorrhagia, and was recently started on Myfembree, which she reports is already making her periods seem lighter.   She denies any GI blood loss or epistaxis.  She has 75% energy and 100% appetite. She endorses that she is maintaining a stable weight.   REVIEW OF SYSTEMS:  Review of Systems  Constitutional:  Positive for fatigue (75%). Negative for appetite change, chills, diaphoresis, fever and unexpected weight change.  HENT:   Negative for lump/mass and nosebleeds.   Eyes:  Negative for eye problems.  Respiratory:  Negative for cough, hemoptysis and shortness of breath.    Cardiovascular:  Negative for chest pain, leg swelling and palpitations.  Gastrointestinal:  Negative for abdominal pain, blood in stool, constipation, diarrhea, nausea and vomiting.  Genitourinary:  Negative for hematuria.   Skin: Negative.   Neurological:  Positive for headaches and numbness. Negative for dizziness and light-headedness.  Hematological:  Does not bruise/bleed easily.  Psychiatric/Behavioral:  Positive for depression and sleep disturbance. The patient is nervous/anxious.      PAST MEDICAL/SURGICAL HISTORY:  Past Medical History:  Diagnosis Date   Abnormal uterine bleeding (AUB) 12/12/2015   Anemia    Anxiety    Asthma    Constipation    Depression    Fibroid    Genital herpes    GERD (gastroesophageal reflux disease)    History of chlamydia    Hyperemesis    IBS (irritable bowel syndrome)    Insomnia    Mental disorder    PTSD, ANXIETY,Depression   PTSD (post-traumatic stress disorder)    Sinusitis    Vaginal dryness 12/12/2015   Past Surgical History:  Procedure Laterality Date   COLONOSCOPY     2016   COLONOSCOPY N/A 04/08/2016   Procedure: COLONOSCOPY;  Surgeon: Danie Binder, MD;  [facility];  Service: Endoscopy;  Laterality: N/A;  10:15 AM   COLONOSCOPY WITH PROPOFOL N/A 07/24/2020   Procedure: COLONOSCOPY WITH PROPOFOL;  Surgeon: [name], DO;  [facility];  Service: Endoscopy;  Laterality: N/A;  11:00am   ESOPHAGOGASTRODUODENOSCOPY N/A 04/08/2016   Procedure: ESOPHAGOGASTRODUODENOSCOPY (EGD);  Surgeon: Danie Binder, MD;  [facility];  Service: Endoscopy;  Laterality: N/A;   ESOPHAGOGASTRODUODENOSCOPY (EGD) WITH PROPOFOL N/A 01/17/2020   Procedure: ESOPHAGOGASTRODUODENOSCOPY (EGD) WITH PROPOFOL;  Surgeon: [name], DO;  [facility];  Service: Endoscopy;  Laterality: N/A;  8:15am   GIVENS CAPSULE STUDY N/A 09/28/2020   Procedure: GIVENS CAPSULE STUDY;  Surgeon: [name], DO;   [facility];  Service: Endoscopy;  Laterality: N/A;  7:30am   PERINEUM REPAIR     UPPER GASTROINTESTINAL ENDOSCOPY  2016     SOCIAL HISTORY:  Social History   Socioeconomic History   Marital status: Married    Spouse name: Not on file   Number of children: 3   Years of education: Not on file   Highest education level: Not on file  Occupational History   Not on file  Tobacco Use   Smoking status: Former    Packs/day: 0.50    Years: 5.00    Pack years: 2.50    Types: Cigarettes    Quit date: 06/03/2010    Years since quitting: 10.6   Smokeless tobacco: Never  Vaping Use   Vaping Use: Never used  Substance and Sexual Activity   Alcohol use: Not Currently    Alcohol/week: 2.0 standard drinks    Types: 2 Glasses of wine per week    Comment: occ wine; denied 11/02/19   Drug use: Not Currently    Types: Marijuana    Comment: 11/02/19 states she used gummies 1-2 times a month.   Sexual activity: Yes    Birth control/protection: Surgical    Comment: vasectomy  Other Topics Concern   Not on file  Social History Narrative   ** Merged History Encounter **       Social Determinants of Health   Financial Resource Strain: Not on file  Food Insecurity: Not on file  Transportation Needs: Not on file  Physical Activity: Not on file  Stress: Not on file  Social Connections: Not on file  Intimate Partner Violence: Not on file    FAMILY HISTORY:  Family History  Problem Relation Age of Onset   Heart disease Mother    Hypertension Mother    Hyperlipidemia Mother    Heart attack Mother    Heart failure Mother    Hypertension Father    Hyperlipidemia Father    Diabetes Father    Cancer Father    Thyroid disease Father    Stroke Father    Epilepsy Brother    Eczema Daughter    Autism spectrum disorder Son    Colon cancer Neg Hx     CURRENT MEDICATIONS:  Outpatient Encounter Medications as of 01/12/2021  Medication Sig   acetaminophen (TYLENOL) 500 MG tablet  Take 500 mg by mouth every 6 (six) hours as needed for mild pain or moderate pain.   albuterol (VENTOLIN HFA) 108 (90 Base) MCG/ACT inhaler Inhale 1 puff into the lungs every 6 (six) hours as needed for wheezing or shortness of breath.   buPROPion (WELLBUTRIN XL) 300 MG 24 hr tablet Take 1 tablet (300 mg total) by mouth daily.   dexlansoprazole (DEXILANT) 60 MG capsule Take 1 capsule (60 mg total) by mouth daily.   escitalopram (LEXAPRO) 10 MG tablet Take 1 tablet (10 mg total) by mouth daily.   linaclotide (LINZESS) 72 MCG capsule Take 1 capsule (72 mcg total) by mouth daily before breakfast.   melatonin 3 MG TABS tablet Take 3 mg by mouth at bedtime as needed.   meloxicam (MOBIC) 15 MG  tablet Take 15 mg by mouth daily as needed for pain. (Patient not taking: Reported on 12/05/2020)   methimazole (TAPAZOLE) 5 MG tablet Take 2 tablets (10 mg total) by mouth daily.   prazosin (MINIPRESS) 5 MG capsule Take 1 capsule (5 mg total) by mouth at bedtime.   Relugolix-Estradiol-Norethind (MYFEMBREE) 40-1-0.5 MG TABS Take 1 tablet by mouth daily.   rizatriptan (MAXALT) 10 MG tablet Take 1 tablet by mouth daily as needed. At onset of headache   VITAMIN D PO Take 1 tablet by mouth daily.   [DISCONTINUED] LO LOESTRIN FE 1 MG-10 MCG / 10 MCG tablet Take 1 tablet by mouth daily. (Patient not taking: Reported on 10/13/2019)   [DISCONTINUED] zolpidem (AMBIEN CR) 12.5 MG CR tablet Take 1 tablet (12.5 mg total) by mouth at bedtime as needed for sleep. (Patient not taking: No sig reported)   No facility-administered encounter medications on file as of 01/12/2021.    ALLERGIES:  Allergies  Allergen Reactions   Antihistamines, Diphenhydramine-Type Anxiety    Patient reports she cannot tolerate diphenhydramine with symptoms of anxiety   Bee Venom Swelling and Other (See Comments)    Reaction:  Localized swelling    Hydrocodone Itching   Penicillins Itching and Other (See Comments)    Has patient had a PCN reaction  causing immediate rash, facial/tongue/throat swelling, SOB or lightheadedness with hypotension: No Has patient had a PCN reaction causing severe rash involving mucus membranes or skin necrosis: No Has patient had a PCN reaction that required hospitalization No Has patient had a PCN reaction occurring within the last 10 years: Yes If all of the above answers are "NO", then may proceed with Cephalosporin use.    Venofer [Iron Sucrose] Nausea And Vomiting and Other (See Comments)    Diaphoresis and vomiting   Reglan [Metoclopramide] Anxiety     PHYSICAL EXAM:  ECOG PERFORMANCE STATUS: 1 - Symptomatic but completely ambulatory  There were no vitals filed for this visit. There were no vitals filed for this visit. Physical Exam Constitutional:      Appearance: Normal appearance. She is obese.  HENT:     Head: Normocephalic and atraumatic.     Mouth/Throat:     Mouth: Mucous membranes are moist.  Eyes:     Extraocular Movements: Extraocular movements intact.     Pupils: Pupils are equal, round, and reactive to light.  Cardiovascular:     Rate and Rhythm: Normal rate and regular rhythm.     Pulses: Normal pulses.     Heart sounds: Normal heart sounds.  Pulmonary:     Effort: Pulmonary effort is normal.     Breath sounds: Normal breath sounds.  Abdominal:     General: Bowel sounds are normal.     Palpations: Abdomen is soft.     Tenderness: There is no abdominal tenderness.  Musculoskeletal:        General: No swelling.     Right lower leg: No edema.     Left lower leg: No edema.  Lymphadenopathy:     Cervical: No cervical adenopathy.  Skin:    General: Skin is warm and dry.  Neurological:     General: No focal deficit present.     Mental Status: She is alert and oriented to person, place, and time.  Psychiatric:        Mood and Affect: Mood normal.        Behavior: Behavior normal.     LABORATORY DATA:  I have reviewed the  labs as listed.  CBC    Component Value  Date/Time   WBC 3.1 (L) 09/25/2020 1053   RBC 5.14 (H) 09/25/2020 1053   HGB 12.6 09/25/2020 1053   HGB 8.5 (L) 08/15/2020 1548   HCT 40.7 09/25/2020 1053   HCT 30.3 (L) 08/15/2020 1548   PLT 230 09/25/2020 1053   PLT 319 08/15/2020 1548   MCV 79.2 (L) 09/25/2020 1053   MCV 66 (L) 08/15/2020 1548   MCH 24.5 (L) 09/25/2020 1053   MCHC 31.0 09/25/2020 1053   RDW Not Measured 09/25/2020 1053   RDW 18.1 (H) 08/15/2020 1548   LYMPHSABS 1.1 09/25/2020 1053   LYMPHSABS 1.2 08/15/2020 1548   MONOABS 0.3 09/25/2020 1053   EOSABS 0.1 09/25/2020 1053   EOSABS 0.0 08/15/2020 1548   BASOSABS 0.0 09/25/2020 1053   BASOSABS 0.1 08/15/2020 1548   CMP Latest Ref Rng & Units 10/15/2019 10/13/2019 01/23/2019  Glucose 70 - 99 mg/dL - 90 -  BUN 4 - '21 7 9 '$ -  Creatinine 0.5 - 1.1 0.7 0.69 -  Sodium 135 - 145 mmol/L - 135 -  Potassium 3.5 - 5.1 mmol/L - 4.2 -  Chloride 98 - 111 mmol/L - 105 -  CO2 22 - 32 mmol/L - 23 -  Calcium 8.9 - 10.3 mg/dL - 8.9 -  Total Protein 6.5 - 8.1 g/dL - 7.7 7.8  Total Bilirubin 0.3 - 1.2 mg/dL - 0.4 0.3  Alkaline Phos 38 - 126 U/L - 144(H) 164(H)  AST 15 - 41 U/L - 17 61(H)  ALT 0 - 44 U/L - 24 89(H)    DIAGNOSTIC IMAGING:  I have independently reviewed the relevant imaging and discussed with the patient.  ASSESSMENT & PLAN: 1.  Microcytic anemia with iron deficiency - Longstanding history of anemia, secondary to menorrhagia and possible occult GI blood loss - EGD was 01/17/2020 which showed gastritis without clear signs of bleeding - Colonoscopy on 07/24/2020 showed nonbleeding internal hemorrhoids and diverticulosis, but was negative for signs of bleeding. - Capsule study in April 2022 was unremarkable - Other causes for anemia were investigated, but unremarkable (normal SPEP, haptoglobin, B12, folate, copper, LDH) - Failed oral iron therapy due to severe constipation and lack of improvement - Denies any GI blood loss within the past 6 months; started on  Myfembree by GYN, reports lighter periods now - Decreased fatigue, current energy 75%; no longer experiencing dyspnea on exertion or near syncope episodes - Most recent IV iron with Feraheme on 11/30/2020.  Patient had mild reaction to Venofer on 10/24/2020, with nausea, vomiting, diaphoresis. - Most recent labs (obtained via [facility] on 01/08/2021): Hemoglobin improved at 15.1, MCV 87, ferritin 181 - PLAN: No indication for IV iron supplementation at this time.  We will repeat labs and phone visit in 4 months, but patient is aware of symptoms that should prompt an earlier appointment.  Continue follow-up with GI and GYN specialists   2.  Vitamin D deficiency - Vitamin D level was low at 23.16 (09/25/2020) - This is being managed by her primary care provider, and patient was already instructed to increase her vitamin D3 supplementation to 5000 units daily - Not checked during most recent labs at [facility]: Check vitamin D at follow-up in 4 months   3.  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  No further work-up or treatment indicated.   4. Social and family history -Patient's father has cancer of unknown type.  He is also anemic, has received multiple blood transfusions over the past year. -Patient is a lifelong non-smoker.  Does not use drugs or alcohol    PLAN SUMMARY & DISPOSITION: -Labs (anemia panel and vitamin D) in 4 months - Phone visit after labs  All questions were answered. The patient knows to call the clinic with any problems, questions or concerns.  Medical decision making: Low  Time spent on visit: I spent 15 minutes counseling the patient face to face. The total time spent in the appointment was  25 minutes and more than 50% was on counseling.   Harriett Rush, PA-C  01/12/2021 11:53 AM

## 2021-01-12 ENCOUNTER — Other Ambulatory Visit: Payer: Self-pay

## 2021-01-12 ENCOUNTER — Inpatient Hospital Stay (HOSPITAL_COMMUNITY): Payer: Medicare HMO | Admitting: Physician Assistant

## 2021-01-12 VITALS — BP 114/91 | HR 83 | Temp 98.1°F | Resp 18 | Wt 171.3 lb

## 2021-01-12 DIAGNOSIS — D500 Iron deficiency anemia secondary to blood loss (chronic): Secondary | ICD-10-CM | POA: Diagnosis not present

## 2021-01-12 DIAGNOSIS — D709 Neutropenia, unspecified: Secondary | ICD-10-CM | POA: Diagnosis not present

## 2021-01-12 DIAGNOSIS — E559 Vitamin D deficiency, unspecified: Secondary | ICD-10-CM | POA: Diagnosis not present

## 2021-01-12 DIAGNOSIS — D509 Iron deficiency anemia, unspecified: Secondary | ICD-10-CM | POA: Diagnosis not present

## 2021-01-12 NOTE — Patient Instructions (Signed)
[place] at [facility] Discharge Instructions  You were seen today by Tarri Abernethy PA-C for your iron deficiency anemia.  Your blood and iron levels both look great at this visit.  You do not need any IV iron at this time.  We will see you for follow-up in 4 months, but in the meantime recommend that you continue to follow-up with her gynecologist for treatment of your heavy periods.  If you develop symptoms of extreme fatigue, shortness of breath on exertion, or feeling like you are going to pass out, please call our office.  These may be symptoms of decreased blood counts, and we will schedule you for repeat labs and office visit earlier.  LABS: Return in 4 months for repeat labs  MEDICATIONS: No changes to home medications  FOLLOW-UP APPOINTMENT: Phone visit in 4 months   Thank you for choosing [facility] to provide your oncology and hematology care.  To afford each patient quality time with our provider, please arrive at least 15 minutes before your scheduled appointment time.   If you have a lab appointment with the [facility] please come in thru the Main Entrance and check in at the main information desk.  You need to re-schedule your appointment should you arrive 10 or more minutes late.  We strive to give you quality time with our providers, and arriving late affects you and other patients whose appointments are after yours.  Also, if you no show three or more times for appointments you may be dismissed from the clinic at the providers discretion.     Again, thank you for choosing [facility].  Our hope is that these requests will decrease the amount of time that you wait before being seen by our physicians.       _____________________________________________________________  Should you have questions after your visit to [facility], please contact our office at [phone] and follow  the prompts.  Our office hours are 8:00 a.m. and 4:30 p.m. Monday - Friday.  Please note that voicemails left after 4:00 p.m. may not be returned until the following business day.  We are closed weekends and major holidays.  You do have access to a nurse 24-7, just call the main number to the clinic [phone] and do not press any options, hold on the line and a nurse will answer the phone.    For prescription refill requests, have your pharmacy contact our office and allow 72 hours.    Due to Covid, you will need to wear a mask upon entering the hospital. If you do not have a mask, a mask will be given to you at the Main Entrance upon arrival. For doctor visits, patients may have 1 support person age 69 or older with them. For treatment visits, patients can not have anyone with them due to social distancing guidelines and our immunocompromised population.

## 2021-01-15 ENCOUNTER — Ambulatory Visit: Payer: Medicare HMO | Admitting: Gastroenterology

## 2021-01-15 ENCOUNTER — Encounter: Payer: Self-pay | Admitting: Gastroenterology

## 2021-01-18 ENCOUNTER — Other Ambulatory Visit: Payer: Self-pay

## 2021-01-18 ENCOUNTER — Ambulatory Visit (HOSPITAL_COMMUNITY)
Admission: RE | Admit: 2021-01-18 | Discharge: 2021-01-18 | Disposition: A | Discharge status: Home / Self Care | Payer: Medicare HMO | Source: Ambulatory Visit | Attending: "Endocrinology | Admitting: "Endocrinology

## 2021-01-18 ENCOUNTER — Ambulatory Visit (HOSPITAL_COMMUNITY): Admission: RE | Admit: 2021-01-18 | Payer: Medicare HMO | Source: Ambulatory Visit

## 2021-01-18 DIAGNOSIS — E042 Nontoxic multinodular goiter: Secondary | ICD-10-CM | POA: Insufficient documentation

## 2021-01-25 LAB — T3, FREE: T3, Free: 3.6 pg/mL (ref 2.0–4.4)

## 2021-01-25 LAB — T4, FREE: Free T4: 1.14 ng/dL (ref 0.82–1.77)

## 2021-01-25 LAB — TSH: TSH: 0.821 u[IU]/mL (ref 0.450–4.500)

## 2021-01-26 ENCOUNTER — Telehealth: Payer: Self-pay

## 2021-01-26 NOTE — Telephone Encounter (Signed)
Pt is requesting refill for methimazole. She was last seen 09/27/20. She did do labs on 01/24/21 and [place] on 8/18 but does not have an appt yet. Ok to refill or wait until pt is seen.

## 2021-01-29 ENCOUNTER — Encounter: Payer: Medicare HMO | Admitting: "Endocrinology

## 2021-01-29 NOTE — Telephone Encounter (Signed)
Left msg for pt to call back

## 2021-01-29 NOTE — Telephone Encounter (Signed)
Pt notified and agrees. She has appt on 02/13/21

## 2021-01-30 NOTE — Progress Notes (Signed)
This encounter was created in error - please disregard.

## 2021-02-13 ENCOUNTER — Encounter: Payer: Self-pay | Admitting: "Endocrinology

## 2021-02-13 ENCOUNTER — Other Ambulatory Visit: Payer: Self-pay

## 2021-02-13 ENCOUNTER — Ambulatory Visit: Payer: Medicare HMO | Admitting: "Endocrinology

## 2021-02-13 VITALS — BP 100/66 | HR 80 | Ht 62.0 in

## 2021-02-13 DIAGNOSIS — E0520 Thyrotoxicosis with toxic multinodular goiter without thyrotoxic crisis or storm: Secondary | ICD-10-CM

## 2021-02-13 MED ORDER — METHIMAZOLE 5 MG PO TABS: 5.0000 mg | ORAL_TABLET | Freq: Every day | ORAL | 1 refills | Status: DC

## 2021-02-13 NOTE — Progress Notes (Signed)
05/01/2020              Endocrinology follow-up note   Subjective:    Patient ID: Charlotte Mccormick, female    DOB: Apr 18, 1986, PCP Denyce Robert, FNP.  Past Medical History:  Diagnosis Date   Abnormal uterine bleeding (AUB) 12/12/2015   Anemia    Anxiety    Asthma    Constipation    Depression    Fibroid    Genital herpes    GERD (gastroesophageal reflux disease)    History of chlamydia    Hyperemesis    IBS (irritable bowel syndrome)    Insomnia    Mental disorder    PTSD, ANXIETY,Depression   PTSD (post-traumatic stress disorder)    Sinusitis    Vaginal dryness 12/12/2015   Past Surgical History:  Procedure Laterality Date   COLONOSCOPY     2016   COLONOSCOPY N/A 04/08/2016   Procedure: COLONOSCOPY;  Surgeon: Danie Binder, MD;  [facility];  Service: Endoscopy;  Laterality: N/A;  10:15 AM   COLONOSCOPY WITH PROPOFOL N/A 07/24/2020   Procedure: COLONOSCOPY WITH PROPOFOL;  Surgeon: [name], DO;  Location: [facility];  Service: Endoscopy;  Laterality: N/A;  11:00am   ESOPHAGOGASTRODUODENOSCOPY N/A 04/08/2016   Procedure: ESOPHAGOGASTRODUODENOSCOPY (EGD);  Surgeon: Danie Binder, MD;  [facility]: [facility];  Service: Endoscopy;  Laterality: N/A;   ESOPHAGOGASTRODUODENOSCOPY (EGD) WITH PROPOFOL N/A 01/17/2020   Procedure: ESOPHAGOGASTRODUODENOSCOPY (EGD) WITH PROPOFOL;  Surgeon: [name], DO;  [facility]: [facility];  Service: Endoscopy;  Laterality: N/A;  8:15am   GIVENS CAPSULE STUDY N/A 09/28/2020   Procedure: GIVENS CAPSULE STUDY;  Surgeon: [name], DO;  [facility];  Service: Endoscopy;  Laterality: N/A;  7:30am   PERINEUM REPAIR     UPPER GASTROINTESTINAL ENDOSCOPY  2016   Social Connections: Not on file   Social History   Socioeconomic History   Marital status: Married    Spouse name: Not on file   Number of children: 3   Years of education: Not on file   Highest education level: Not  on file  Occupational History   Not on file  Tobacco Use   Smoking status: Former    Packs/day: 0.50    Years: 5.00    Pack years: 2.50    Types: Cigarettes    Quit date: 06/03/2010    Years since quitting: 10.7   Smokeless tobacco: Never  Vaping Use   Vaping Use: Never used  Substance and Sexual Activity   Alcohol use: Not Currently    Alcohol/week: 2.0 standard drinks    Types: 2 Glasses of wine per week    Comment: occ wine; denied 11/02/19   Drug use: Not Currently    Types: Marijuana    Comment: 11/02/19 states she used gummies 1-2 times a month.   Sexual activity: Yes    Birth control/protection: Surgical    Comment: vasectomy  Other Topics Concern   Not on file  Social History Narrative   ** Merged History Encounter **       Social Determinants of Health   Financial Resource Strain: Not on file  Food Insecurity: Not on file  Transportation Needs: Not on file  Physical Activity: Not on file  Stress: Not on file  Social Connections: Not on file  Intimate Partner Violence: Not on file   Family History  Problem Relation Age of Onset   Heart disease Mother  Hypertension Mother    Hyperlipidemia Mother    Heart attack Mother    Heart failure Mother    Hypertension Father    Hyperlipidemia Father    Diabetes Father    Cancer Father    Thyroid disease Father    Stroke Father    Epilepsy Brother    Eczema Daughter    Autism spectrum disorder Son    Colon cancer Neg Hx    Current Outpatient Medications on File Prior to Visit  Medication Sig Dispense Refill   acetaminophen (TYLENOL) 500 MG tablet Take 500 mg by mouth every 6 (six) hours as needed for mild pain or moderate pain.     albuterol (VENTOLIN HFA) 108 (90 Base) MCG/ACT inhaler Inhale 1 puff into the lungs every 6 (six) hours as needed for wheezing or shortness of breath.     buPROPion (WELLBUTRIN XL) 300 MG 24 hr tablet Take 1 tablet (300 mg total) by mouth daily. 30 tablet 3   dexlansoprazole  (DEXILANT) 60 MG capsule Take 1 capsule (60 mg total) by mouth daily. 90 capsule 3   escitalopram (LEXAPRO) 10 MG tablet Take 1 tablet (10 mg total) by mouth daily. 30 tablet 2   linaclotide (LINZESS) 72 MCG capsule Take 1 capsule (72 mcg total) by mouth daily before breakfast. 30 capsule 5   melatonin 3 MG TABS tablet Take 3 mg by mouth at bedtime as needed.     meloxicam (MOBIC) 15 MG tablet Take 15 mg by mouth daily as needed for pain.     prazosin (MINIPRESS) 5 MG capsule Take 1 capsule (5 mg total) by mouth at bedtime. 30 capsule 3   Relugolix-Estradiol-Norethind (MYFEMBREE) 40-1-0.5 MG TABS Take 1 tablet by mouth daily. 30 tablet 11   rizatriptan (MAXALT) 10 MG tablet Take 1 tablet by mouth daily as needed. At onset of headache     VITAMIN D PO Take 1 tablet by mouth daily.     [DISCONTINUED] LO LOESTRIN FE 1 MG-10 MCG / 10 MCG tablet Take 1 tablet by mouth daily. (Patient not taking: Reported on 10/13/2019) 3 Package 3   [DISCONTINUED] zolpidem (AMBIEN CR) 12.5 MG CR tablet Take 1 tablet (12.5 mg total) by mouth at bedtime as needed for sleep. (Patient not taking: No sig reported) 30 tablet 2   No current facility-administered medications on file prior to visit.   Allergies  Allergen Reactions   Antihistamines, Diphenhydramine-Type Anxiety    Patient reports she cannot tolerate diphenhydramine with symptoms of anxiety   Bee Venom Swelling and Other (See Comments)    Reaction:  Localized swelling    Hydrocodone Itching   Penicillins Itching and Other (See Comments)    Has patient had a PCN reaction causing immediate rash, facial/tongue/throat swelling, SOB or lightheadedness with hypotension: No Has patient had a PCN reaction causing severe rash involving mucus membranes or skin necrosis: No Has patient had a PCN reaction that required hospitalization No Has patient had a PCN reaction occurring within the last 10 years: Yes If all of the above answers are "NO", then may proceed with  Cephalosporin use.    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  She was treated with methimazole 10 mg p.o. daily until she stopped on June 26, 2020.  While she was undergoing work-up for hyperthyroidism, she was found to have multinodular goiter with 1 suspicious nodule.  She was sent for fine-needle aspiration biopsy which she underwent on May 03, 2020.  Results where significant for scant follicular epithelium, satisfactory but limited for evaluation due to scant cellularity.  Her subsequent work-up with thyroid uptake and scan  was  consistent with areas of increased activity, overall uptake was 27%.  She wished to avoid ablative treatment and went on higher dose of methimazole as an option.     Her previsit labs are consistent with adequate and appropriate response to treatment with normalization of her thyroid hormones.   She denies anxiety, palpitations, heat intolerance. she denies dysphagia, choking, shortness of breath, no recent voice change.  She did not have subsequent repeat ultrasound.   she has family history of thyroid dysfunction in her father, but denies family hx of thyroid cancer. she denies personal history of goiter. she is not on any anti-thyroid medications nor on any thyroid hormone supplements.                           Review of systems     Objective:    Vitals with BMI 02/13/2021 01/12/2021 12/05/2020  Height '5\' 2"'$  - -  Weight - 171 lbs 5 oz -  BMI - - -  Systolic [id number]  Diastolic 66 91 78  Pulse 80 83 116  Some encounter information is confidential and restricted. Go to Review Flowsheets activity to see all data.                                          Physical exam  Recent Results (from the past 2160 hour(s))  TSH      Status: None   Collection Time: 01/24/21  3:48 PM  Result Value Ref Range   TSH 0.821 0.450 - 4.500 uIU/mL  T4, free     Status: None   Collection Time: 01/24/21  3:48 PM  Result Value Ref Range   Free T4 1.14 0.82 - 1.77 ng/dL  T3, free     Status: None   Collection Time: 01/24/21  3:48 PM  Result Value Ref Range   T3, Free 3.6 2.0 - 4.4 pg/mL       Thyroid uptake and scan on February 03, 2020 FINDINGS: Multinodular appearing thyroid lobes.   Dominant warm nodules at mid inferior RIGHT thyroid lobe with additional smaller warm nodules at the superior thyroid lobes bilaterally.   Small cold nodule seen mid to upper RIGHT lobe and mid LEFT lobe.   4 hour I-123 uptake = 13.0% (normal 5-20%),   24 hour I-123 uptake = 27.2% (normal 10-30%)   IMPRESSION: Multinodular thyroid gland.   Normal 4 hour and 24 hour radio iodine uptakes.    Thyroid ultrasound April 26, 2020 IMPRESSION: 1. Multinodular goiter. 2. Solid nodule in the mid right thyroid measuring up to 1.2 cm (labeled 3) highly suspicious for malignancy and meets criteria for tissue sampling (TI-RADS category 5). This nodule may correspond to the right mid to superior cold nodule described on recent nuclear medicine study. Recommend ultrasound-guided fine-needle aspiration. 3. Nodules labeled 2 (right superior), 5 (left superior), and 6 (left mid) meet criteria for annual ultrasound surveillance.    Fine-needle aspiration of a thyroid nodule on May 03, 2020  FINAL MICROSCOPIC DIAGNOSIS:  - Scant follicular epithelium present ([employer] category I)   SPECIMEN ADEQUACY:  Satisfactory but limited for evaluation, scant cellularity   GROSS:  Received is/are 3 slides in 95% Ethyl alcohol, 3 air dried slides for  diff stain, and 30 ccs of clear colorless Cytolyt solution. (CM:cm)  Prepared:  Smears:  6  Concentration Method (Thin Prep):  1  Cell Block:  Cell block attempted, not obtained.    Her most  recent thyroid ultrasound on January 18, 2021 IMPRESSION: 1. No significant interval change in the size or appearance of the previously biopsied nodule in the right mid gland. Recommend correlation with prior biopsy results. 2. Stable 1 cm TI-RADS category 4 nodule in the left upper gland (labeled #5) which continues to meet criteria for imaging surveillance. Recommend follow-up ultrasound in 1 year. 3. Interval involution of nodules in the right upper and left lower gland which no longer meet criteria for continued surveillance. 4. No new nodules or suspicious findings.    Assessment & Plan:   1. Subclinical hyperthyroidism  2. MNG Her biopsy results are benign. Her repeat previsit complete thyroid function tests are consistent with inadequate treatment response with methimazole 5 mg.  Her prior 24-hour thyroid uptake was 27.2% with areas of increased uptake consistent with multinodular goiter.   She opted for antithyroid medications instead of ablative treatment.  She would benefit from continued treatment with a lower dose of methimazole, 5 mg p.o. daily at breakfast which she accepts.  I discussed and prescribed to her pharmacy.  She will have repeat thyroid function test before her next visit in 4 months. She will have repeat thyroid ultrasound 1 year from now for surveillance purposes. If her nodular features change, she may need repeat fine-needle aspiration. If her response to methimazole is not adequate, she will be considered for I-131 thyroid ablation in the future.   Side effects and precautions of methimazole were discussed with her.    -Patient is advised to maintain close follow up with Denyce Robert, FNP for primary care needs.   I spent 21 minutes in the care of the patient today including review of labs from Thyroid Function, CMP, and other relevant labs ; imaging/biopsy records (current and previous including abstractions from other facilities); face-to-face time  discussing  her lab results and symptoms, medications doses, her options of short and long term treatment based on the latest standards of care / guidelines;   and documenting the encounter.  Azalynn Shields-Lanier  participated in the discussions, expressed understanding, and voiced agreement with the above plans.  All questions were answered to her satisfaction. she is encouraged to contact clinic should she have any questions or concerns prior to her return visit. should she have any questions or concerns prior to her return visit.    Follow up plan: Return in about 2 weeks (around 05/15/2020) for F/U with Biopsy Results.   Thank you for involving me in the care of this pleasant patient, and I will continue to update you with her progress.  Glade Lloyd, MD [facility] Phone: [phone]  Fax: [phone]   05/01/2020, 12:00 PM  This note was partially dictated with voice recognition software. Similar sounding words can be transcribed inadequately or may not  be corrected upon review.

## 2021-03-03 ENCOUNTER — Encounter (HOSPITAL_COMMUNITY): Payer: Self-pay | Admitting: Hematology
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
## 2021-03-05 ENCOUNTER — Encounter (HOSPITAL_COMMUNITY): Payer: Self-pay | Admitting: Hematology

## 2021-03-06 ENCOUNTER — Ambulatory Visit (INDEPENDENT_AMBULATORY_CARE_PROVIDER_SITE_OTHER): Payer: Medicare HMO | Admitting: Obstetrics & Gynecology

## 2021-03-06 ENCOUNTER — Other Ambulatory Visit: Payer: Self-pay

## 2021-03-06 ENCOUNTER — Encounter: Payer: Self-pay | Admitting: Obstetrics & Gynecology

## 2021-03-06 ENCOUNTER — Ambulatory Visit (INDEPENDENT_AMBULATORY_CARE_PROVIDER_SITE_OTHER): Payer: Medicare HMO

## 2021-03-06 VITALS — BP 112/79 | HR 74 | Ht 62.0 in | Wt 174.5 lb

## 2021-03-06 DIAGNOSIS — N920 Excessive and frequent menstruation with regular cycle: Secondary | ICD-10-CM | POA: Diagnosis not present

## 2021-03-06 DIAGNOSIS — D500 Iron deficiency anemia secondary to blood loss (chronic): Secondary | ICD-10-CM | POA: Diagnosis not present

## 2021-03-06 DIAGNOSIS — N946 Dysmenorrhea, unspecified: Secondary | ICD-10-CM

## 2021-03-06 DIAGNOSIS — N921 Excessive and frequent menstruation with irregular cycle: Secondary | ICD-10-CM | POA: Diagnosis not present

## 2021-03-06 LAB — POCT HEMOGLOBIN: Hemoglobin: 14 g/dL (ref 11–14.6)

## 2021-03-06 NOTE — Addendum Note (Signed)
Addended by: Linton Rump on: 03/06/2021 12:14 PM   Modules accepted: Orders

## 2021-03-06 NOTE — Progress Notes (Signed)
Follow up appointment for results  Chief Complaint  Patient presents with   Follow-up    Discuss [place] results    Blood pressure 112/79, pulse 74, height 5\' 2"  (1.575 m), weight 174 lb 8 oz (79.2 kg), last menstrual period 02/22/2021.  Husband with a vasectomy  US PELVIC COMPLETE WITH TRANSVAGINAL  Result Date: 03/06/2021 Images from the original result were not included.  [facility] [address] [place] [address]                                                                            GYNECOLOGIC SONOGRAM Charlotte Mccormick is a 35 y.o. [id number] LMP 02/22/2021 She is for a pelvic sonogram for frequent menstruation. Uterus                      9 x 5.9 x 6.6 cm, Total uterine volume 183 cc,homogeneous retroverted uterus,WNL Endometrium          12.7 mm, symmetrical, WNL Right ovary             2.4 x 2.3 x 2.9 cm, WNL Left ovary                3.4 x 1.1 x 1.9 cm, WNL No free fluid Technician Comments: PELVIC US TA/TV: homogeneous retroverted uterus,WNL,EEC 12.7 mm,normal ovaries,ovaries appear mobile,no pain during ultrasound Chaperone [address] Heide Guile 03/06/2021 10:50 AM Clinical Impression and recommendations: I have reviewed the sonogram results above, combined with the patient's current clinical course, below are my impressions and any appropriate recommendations for management based on the sonographic findings. Uterus is upper limits normal in size, normal shape and contour Endometrium is normal Both ovaries are normal in size shape and morphology Florian Buff 03/06/2021 11:16 AM   Pt states her bleeding is 75% improved and her pain with bleeding and pain in general is 50% improved on the myfembree  Hemoglobin today is 14.6 grams which is up from 8.6 in April  MEDS ordered this encounter: No orders of the defined types were placed in this encounter.   Orders for this encounter: No orders of the defined types were placed in this  encounter.   Impression:   ICD-10-CM   1. Menometrorrhagia  N92.1    75% improvement on myfembree    2. Dysmenorrhea  N94.6    50% improvement on myfembree       Plan: Continue on myfembree  Follow Up: Return in about 6 months (around 09/04/2021) for Follow up, with Dr Elonda Husky.      All questions were answered.  Past Medical History:  Diagnosis Date   Abnormal uterine bleeding (AUB) 12/12/2015   Anemia    Anxiety    Asthma    Constipation    Depression    Fibroid    Genital herpes    GERD (gastroesophageal reflux disease)    History of chlamydia    Hyperemesis    IBS (irritable bowel syndrome)    Insomnia    Mental disorder    PTSD, ANXIETY,Depression   PTSD (post-traumatic stress disorder)    Sinusitis    Thyroid disease    Vaginal dryness  12/12/2015    Past Surgical History:  Procedure Laterality Date   COLONOSCOPY     2016   COLONOSCOPY N/A 04/08/2016   Procedure: COLONOSCOPY;  Surgeon: Danie Binder, MD;  [facility];  Service: Endoscopy;  Laterality: N/A;  10:15 AM   COLONOSCOPY WITH PROPOFOL N/A 07/24/2020   Procedure: COLONOSCOPY WITH PROPOFOL;  Surgeon: [name], DO;  Location: [facility];  Service: Endoscopy;  Laterality: N/A;  11:00am   ESOPHAGOGASTRODUODENOSCOPY N/A 04/08/2016   Procedure: ESOPHAGOGASTRODUODENOSCOPY (EGD);  Surgeon: Danie Binder, MD;  [facility]: [facility];  Service: Endoscopy;  Laterality: N/A;   ESOPHAGOGASTRODUODENOSCOPY (EGD) WITH PROPOFOL N/A 01/17/2020   Procedure: ESOPHAGOGASTRODUODENOSCOPY (EGD) WITH PROPOFOL;  Surgeon: [name], DO;  [facility]: [facility];  Service: Endoscopy;  Laterality: N/A;  8:15am   GIVENS CAPSULE STUDY N/A 09/28/2020   Procedure: GIVENS CAPSULE STUDY;  Surgeon: [name], DO;  [facility]: [facility];  Service: Endoscopy;  Laterality: N/A;  7:30am   PERINEUM REPAIR     UPPER GASTROINTESTINAL ENDOSCOPY  2016    OB History     Gravida  3   Para  3    Term  3   Preterm  0   AB  0   Living  3      SAB  0   IAB  0   Ectopic  0   Multiple  0   Live Births  3           Allergies  Allergen Reactions   Antihistamines, Diphenhydramine-Type Anxiety    Patient reports she cannot tolerate diphenhydramine with symptoms of anxiety   Bee Venom Swelling and Other (See Comments)    Reaction:  Localized swelling    Hydrocodone Itching   Penicillins Itching and Other (See Comments)    Has patient had a PCN reaction causing immediate rash, facial/tongue/throat swelling, SOB or lightheadedness with hypotension: No Has patient had a PCN reaction causing severe rash involving mucus membranes or skin necrosis: No Has patient had a PCN reaction that required hospitalization No Has patient had a PCN reaction occurring within the last 10 years: Yes If all of the above answers are "NO", then may proceed with Cephalosporin use.    Venofer [Iron Sucrose] Nausea And Vomiting and Other (See Comments)    Diaphoresis and vomiting   Reglan [Metoclopramide] Anxiety    Social History   Socioeconomic History   Marital status: Married    Spouse name: Not on file   Number of children: 3   Years of education: Not on file   Highest education level: Not on file  Occupational History   Not on file  Tobacco Use   Smoking status: Former    Packs/day: 0.50    Years: 5.00    Pack years: 2.50    Types: Cigarettes    Quit date: 06/03/2010    Years since quitting: 10.7   Smokeless tobacco: Never  Vaping Use   Vaping Use: Never used  Substance and Sexual Activity   Alcohol use: Not Currently    Alcohol/week: 2.0 standard drinks    Types: 2 Glasses of wine per week    Comment: occ wine; denied 11/02/19   Drug use: Not Currently    Types: Marijuana    Comment: 11/02/19 states she used gummies 1-2 times a month.   Sexual activity: Yes    Birth control/protection: Surgical    Comment: vasectomy  Other Topics Concern  Not on file  Social History  Narrative   ** Merged History Encounter **       Social Determinants of Health   Financial Resource Strain: Not on file  Food Insecurity: Not on file  Transportation Needs: Not on file  Physical Activity: Not on file  Stress: Not on file  Social Connections: Not on file    Family History  Problem Relation Age of Onset   Heart disease Mother    Hypertension Mother    Hyperlipidemia Mother    Heart attack Mother    Heart failure Mother    Hypertension Father    Hyperlipidemia Father    Diabetes Father    Cancer Father    Thyroid disease Father    Stroke Father    Epilepsy Brother    Eczema Daughter    Autism spectrum disorder Son    Colon cancer Neg Hx

## 2021-03-14 NOTE — Addendum Note (Signed)
Addended by: Candis Musa on: 03/14/2021 08:23 AM   Modules accepted: Orders

## 2021-05-07 ENCOUNTER — Other Ambulatory Visit (HOSPITAL_COMMUNITY): Payer: Self-pay

## 2021-05-07 DIAGNOSIS — E559 Vitamin D deficiency, unspecified: Secondary | ICD-10-CM

## 2021-05-07 DIAGNOSIS — D500 Iron deficiency anemia secondary to blood loss (chronic): Secondary | ICD-10-CM

## 2021-05-15 LAB — CBC WITH DIFFERENTIAL/PLATELET
Basophils Absolute: 0 10*3/uL (ref 0.0–0.2)
Basos: 0 %
EOS (ABSOLUTE): 0.1 10*3/uL (ref 0.0–0.4)
Eos: 1 %
Hematocrit: 43.5 % (ref 34.0–46.6)
Hemoglobin: 14.4 g/dL (ref 11.1–15.9)
Immature Grans (Abs): 0 10*3/uL (ref 0.0–0.1)
Immature Granulocytes: 0 %
Lymphocytes Absolute: 1.4 10*3/uL (ref 0.7–3.1)
Lymphs: 31 %
MCH: 28.5 pg (ref 26.6–33.0)
MCHC: 33.1 g/dL (ref 31.5–35.7)
MCV: 86 fL (ref 79–97)
Monocytes Absolute: 0.5 10*3/uL (ref 0.1–0.9)
Monocytes: 10 %
Neutrophils Absolute: 2.6 10*3/uL (ref 1.4–7.0)
Neutrophils: 58 %
Platelets: 271 10*3/uL (ref 150–450)
RBC: 5.05 x10E6/uL (ref 3.77–5.28)
RDW: 12.1 % (ref 11.7–15.4)
WBC: 4.6 10*3/uL (ref 3.4–10.8)

## 2021-05-15 LAB — IRON AND TIBC
Iron Saturation: 28 % (ref 15–55)
Iron: 95 ug/dL (ref 27–159)
Total Iron Binding Capacity: 344 ug/dL (ref 250–450)
UIBC: 249 ug/dL (ref 131–425)

## 2021-05-15 LAB — FERRITIN: Ferritin: 58 ng/mL (ref 15–150)

## 2021-05-15 LAB — VITAMIN D 25 HYDROXY (VIT D DEFICIENCY, FRACTURES): Vit D, 25-Hydroxy: 16.4 ng/mL — ABNORMAL LOW (ref 30.0–100.0)

## 2021-05-17 NOTE — Progress Notes (Signed)
Virtual Visit via Telephone Note [facility]  I connected with Charlotte Mccormick  on 05/18/21  at  10:16 AM  by telephone and verified that I am speaking with the correct person using two identifiers.  Location: Patient: Home Provider: [facility]   I discussed the limitations, risks, security and privacy concerns of performing an evaluation and management service by telephone and the availability of in person appointments. I also discussed with the patient that there may be a patient responsible charge related to this service. The patient expressed understanding and agreed to proceed.   REASON FOR VISIT:  Follow-up for iron deficiency anemia   CURRENT THERAPY: IV iron as needed  HISTORY OF PRESENT ILLNESS: Charlotte Mccormick follows at our clinic for her iron deficiency anemia.  She was last seen in clinic by Tarri Abernethy PA-C on 01/12/2021.  Her last IV iron infusion was 11/20/2020 and 11/30/2020.  At today's visit, she reports feeling fair.  She denies any recent hospitalizations, surgeries, or changes in her baseline health status.  Overall, she is doing better than before.  Her periods have been more regular after being started on Myfembree by her GYN - she is only bleeding once per month now, describes her blood flow as moderate but not as heavy as before.  She was also started on methimazole for hyperthyroidism and is feeling improvement in her fatigue, but she does still have some persistent night sweats.  She denies any epistaxis, hematemesis, hematochezia, or melena.  No pica, restless legs, headaches, chest pain, dyspnea on exertion, lightheadedness, or syncope.  She reports 75% energy and 100% appetite.  She is maintaining stable weight at this time.    OBSERVATIONS/OBJECTIVE: Review of Systems  Constitutional:  Positive for malaise/fatigue (improved, energy 75%). Negative for chills, diaphoresis, fever and weight loss.  Respiratory:   Negative for cough and shortness of breath.   Cardiovascular:  Negative for chest pain and palpitations.  Gastrointestinal:  Positive for constipation. Negative for abdominal pain, blood in stool, melena, nausea and vomiting.  Neurological:  Negative for dizziness and headaches.  Psychiatric/Behavioral:  Positive for depression. The patient is nervous/anxious.     PHYSICAL EXAM (per limitations of virtual telephone visit): The patient is alert and oriented x 3, exhibiting adequate mentation, good mood, and ability to speak in full sentences and execute sound judgement.   ASSESSMENT & PLAN: 1.  Microcytic anemia with iron deficiency - Longstanding history of anemia, most likely secondary to menorrhagia  - EGD was 01/17/2020 which showed gastritis without clear signs of bleeding - Colonoscopy on 07/24/2020 showed nonbleeding internal hemorrhoids and diverticulosis, but was negative for signs of bleeding. - Capsule study in April 2022 was unremarkable - Other causes for anemia were investigated, but unremarkable (normal SPEP, haptoglobin, B12, folate, copper, LDH) - Failed oral iron therapy due to severe constipation and lack of improvement - Denies any GI blood loss within the past 6 months; started on Myfembree by GYN, reports more regular periods now   - Decreased fatigue, current energy 75%; no longer experiencing dyspnea on exertion or near syncope episodes   - Most recent IV iron with Feraheme on 11/30/2020.  Patient had mild reaction to Venofer on 10/24/2020, with nausea, vomiting, diaphoresis.   - Most recent labs (obtained via [place] on 05/14/2021): Hemoglobin 14.4/MCV 86, ferritin 58, iron saturation 28% - PLAN: No indication for IV iron supplementation at this time.   We will repeat labs and phone visit in  4 months, but patient is aware of symptoms that should prompt an earlier appointment.  Continue follow-up with GI and GYN specialists   2.  Vitamin D deficiency - Vitamin D level  remains low at 16.4 (05/14/2021) - She has not been taking any vitamin D supplementation.   - PLAN: Recommend restarting OTC vitamin D 2000 units daily.  Check vitamin D at follow-up in 4 months   3.  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  No further work-up or treatment indicated.   4. Social and family history -Patient's father has cancer of unknown type.  He is also anemic, has received multiple blood transfusions over the past year. -Patient is a lifelong non-smoker.  Does not use drugs or alcohol   FOLLOW UP INSTRUCTIONS: Labs in 4 months - CBC, iron/TIBC, ferritin, Vitamin D Follow-up after labs    I discussed the assessment and treatment plan with the patient. The patient was provided an opportunity to ask questions and all were answered. The patient agreed with the plan and demonstrated an understanding of the instructions.   The patient was advised to call back or seek an in-person evaluation if the symptoms worsen or if the condition fails to improve as anticipated.  I provided 22 minutes of non-face-to-face time during this encounter.   Harriett Rush, PA-C 05/18/2021 10:38 AM

## 2021-05-18 ENCOUNTER — Other Ambulatory Visit: Payer: Self-pay

## 2021-05-18 ENCOUNTER — Inpatient Hospital Stay (HOSPITAL_COMMUNITY): Payer: Medicare HMO | Attending: Physician Assistant | Admitting: Physician Assistant

## 2021-05-18 DIAGNOSIS — D500 Iron deficiency anemia secondary to blood loss (chronic): Secondary | ICD-10-CM | POA: Diagnosis not present

## 2021-05-18 DIAGNOSIS — E559 Vitamin D deficiency, unspecified: Secondary | ICD-10-CM | POA: Diagnosis not present

## 2021-06-18 ENCOUNTER — Ambulatory Visit: Payer: Medicare HMO | Admitting: "Endocrinology

## 2021-07-03 ENCOUNTER — Encounter (HOSPITAL_COMMUNITY): Payer: Self-pay | Admitting: Hematology

## 2021-07-03 LAB — T4, FREE: Free T4: 0.94 ng/dL (ref 0.82–1.77)

## 2021-07-03 LAB — T3, FREE: T3, Free: 3.3 pg/mL (ref 2.0–4.4)

## 2021-07-03 LAB — TSH: TSH: 0.871 u[IU]/mL (ref 0.450–4.500)

## 2021-07-10 ENCOUNTER — Ambulatory Visit: Payer: Self-pay | Admitting: "Endocrinology

## 2021-07-19 ENCOUNTER — Ambulatory Visit (INDEPENDENT_AMBULATORY_CARE_PROVIDER_SITE_OTHER): Payer: Medicare HMO | Admitting: "Endocrinology

## 2021-07-19 ENCOUNTER — Other Ambulatory Visit: Payer: Self-pay

## 2021-07-19 ENCOUNTER — Encounter: Payer: Self-pay | Admitting: "Endocrinology

## 2021-07-19 VITALS — BP 98/68 | HR 88 | Ht 62.0 in | Wt 184.0 lb

## 2021-07-19 DIAGNOSIS — E0520 Thyrotoxicosis with toxic multinodular goiter without thyrotoxic crisis or storm: Secondary | ICD-10-CM | POA: Diagnosis not present

## 2021-07-19 MED ORDER — METHIMAZOLE 5 MG PO TABS: 2.5000 mg | ORAL_TABLET | Freq: Every day | ORAL | 1 refills | Status: DC

## 2021-07-19 NOTE — Progress Notes (Signed)
05/01/2020              Endocrinology follow-up note   Subjective:    Patient ID: Charlotte Mccormick, female    DOB: 04-04-1986, PCP Denyce Robert, FNP.  Past Medical History:  Diagnosis Date   Abnormal uterine bleeding (AUB) 12/12/2015   Anemia    Anxiety    Asthma    Constipation    Depression    Fibroid    Genital herpes    GERD (gastroesophageal reflux disease)    History of chlamydia    Hyperemesis    IBS (irritable bowel syndrome)    Insomnia    Mental disorder    PTSD, ANXIETY,Depression   PTSD (post-traumatic stress disorder)    Sinusitis    Thyroid disease    Vaginal dryness 12/12/2015   Past Surgical History:  Procedure Laterality Date   COLONOSCOPY     2016   COLONOSCOPY N/A 04/08/2016   Procedure: COLONOSCOPY;  Surgeon: Danie Binder, MD;  Location: [facility];  Service: Endoscopy;  Laterality: N/A;  10:15 AM   COLONOSCOPY WITH PROPOFOL N/A 07/24/2020   Procedure: COLONOSCOPY WITH PROPOFOL;  Surgeon: [name], DO;  Location: [facility];  Service: Endoscopy;  Laterality: N/A;  11:00am   ESOPHAGOGASTRODUODENOSCOPY N/A 04/08/2016   Procedure: ESOPHAGOGASTRODUODENOSCOPY (EGD);  Surgeon: Danie Binder, MD;  Location: [facility];  Service: Endoscopy;  Laterality: N/A;   ESOPHAGOGASTRODUODENOSCOPY (EGD) WITH PROPOFOL N/A 01/17/2020   Procedure: ESOPHAGOGASTRODUODENOSCOPY (EGD) WITH PROPOFOL;  Surgeon: [name], DO;  Location: [facility];  Service: Endoscopy;  Laterality: N/A;  8:15am   GIVENS CAPSULE STUDY N/A 09/28/2020   Procedure: GIVENS CAPSULE STUDY;  Surgeon: [name], DO;  Location: [facility];  Service: Endoscopy;  Laterality: N/A;  7:30am   PERINEUM REPAIR     UPPER GASTROINTESTINAL ENDOSCOPY  2016   Social Connections: Not on file   Social History   Socioeconomic History   Marital status: Married    Spouse name: Not on file   Number of children: 3   Years of education: Not on file   Highest  education level: Not on file  Occupational History   Not on file  Tobacco Use   Smoking status: Former    Packs/day: 0.50    Years: 5.00    Pack years: 2.50    Types: Cigarettes    Quit date: 06/03/2010    Years since quitting: 11.1   Smokeless tobacco: Never  Vaping Use   Vaping Use: Never used  Substance and Sexual Activity   Alcohol use: Not Currently    Alcohol/week: 2.0 standard drinks    Types: 2 Glasses of wine per week    Comment: occ wine; denied 11/02/19   Drug use: Not Currently    Types: Marijuana    Comment: 11/02/19 states she used gummies 1-2 times a month.   Sexual activity: Yes    Birth control/protection: Surgical    Comment: vasectomy  Other Topics Concern   Not on file  Social History Narrative   ** Merged History Encounter **       Social Determinants of Health   Financial Resource Strain: Not on file  Food Insecurity: Not on file  Transportation Needs: Not on file  Physical Activity: Not on file  Stress: Not on file  Social Connections: Not on file  Intimate Partner Violence: Not on file   Family History  Problem Relation Age of Onset  Heart disease Mother    Hypertension Mother    Hyperlipidemia Mother    Heart attack Mother    Heart failure Mother    Hypertension Father    Hyperlipidemia Father    Diabetes Father    Cancer Father    Thyroid disease Father    Stroke Father    Epilepsy Brother    Eczema Daughter    Autism spectrum disorder Son    Colon cancer Neg Hx    Current Outpatient Medications on File Prior to Visit  Medication Sig Dispense Refill   acetaminophen (TYLENOL) 500 MG tablet Take 500 mg by mouth every 6 (six) hours as needed for mild pain or moderate pain.     albuterol (VENTOLIN HFA) 108 (90 Base) MCG/ACT inhaler Inhale 1 puff into the lungs every 6 (six) hours as needed for wheezing or shortness of breath.     meloxicam (MOBIC) 15 MG tablet Take 15 mg by mouth daily as needed for pain.     rizatriptan (MAXALT) 10 MG  tablet Take 1 tablet by mouth daily as needed. At onset of headache     buPROPion (WELLBUTRIN XL) 300 MG 24 hr tablet Take 1 tablet (300 mg total) by mouth daily. 30 tablet 3   dexlansoprazole (DEXILANT) 60 MG capsule Take 1 capsule (60 mg total) by mouth daily. 90 capsule 3   escitalopram (LEXAPRO) 10 MG tablet Take 1 tablet (10 mg total) by mouth daily. 30 tablet 2   linaclotide (LINZESS) 72 MCG capsule Take 1 capsule (72 mcg total) by mouth daily before breakfast. 30 capsule 5   prazosin (MINIPRESS) 5 MG capsule Take 1 capsule (5 mg total) by mouth at bedtime. 30 capsule 3   Relugolix-Estradiol-Norethind (MYFEMBREE) 40-1-0.5 MG TABS Take 1 tablet by mouth daily. 30 tablet 11   [DISCONTINUED] LO LOESTRIN FE 1 MG-10 MCG / 10 MCG tablet Take 1 tablet by mouth daily. (Patient not taking: Reported on 10/13/2019) 3 Package 3   [DISCONTINUED] zolpidem (AMBIEN CR) 12.5 MG CR tablet Take 1 tablet (12.5 mg total) by mouth at bedtime as needed for sleep. (Patient not taking: No sig reported) 30 tablet 2   No current facility-administered medications on file prior to visit.   Allergies  Allergen Reactions   Antihistamines, Diphenhydramine-Type Anxiety    Patient reports she cannot tolerate diphenhydramine with symptoms of anxiety   Bee Venom Swelling and Other (See Comments)    Reaction:  Localized swelling    Hydrocodone Itching   Penicillins Itching and Other (See Comments)    Has patient had a PCN reaction causing immediate rash, facial/tongue/throat swelling, SOB or lightheadedness with hypotension: No Has patient had a PCN reaction causing severe rash involving mucus membranes or skin necrosis: No Has patient had a PCN reaction that required hospitalization No Has patient had a PCN reaction occurring within the last 10 years: Yes If all of the above answers are "NO", then may proceed with Cephalosporin use.    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  She is currently on methimazole 5 mg p.o. daily to control relapsing, clinically significant thyrotoxicosis.     While she was undergoing work-up for hyperthyroidism, she was found to have multinodular goiter with 1 suspicious nodule.  She was sent for fine-needle aspiration  biopsy which she underwent on May 03, 2020.  Results where significant for scant follicular epithelium, satisfactory but limited for evaluation due to scant cellularity.  Her subsequent work-up with thyroid uptake and scan  was  consistent with areas of increased activity, overall uptake was 27%.  She wished to avoid ablative treatment and went on higher dose of methimazole as an option.     Her previsit labs are consistent with treatment effect with appropriate response to normal ranges.  She complains of weight gain, reversing what she lost during thyrotoxicosis.    She denies anxiety, palpitations, heat intolerance. she denies dysphagia, choking, shortness of breath, no recent voice change.  She did not have subsequent repeat ultrasound.   she has family history of thyroid dysfunction in her father, but denies family hx of thyroid cancer. she denies personal history of goiter. she is not on any anti-thyroid medications nor on any thyroid hormone supplements.                           Review of systems     Objective:    Vitals with BMI 07/19/2021 03/06/2021 02/13/2021  Height 5\' 2"  5\' 2"  5\' 2"   Weight 184 lbs 174 lbs 8 oz -  BMI 62.70 35.00 -  Systolic 98 938 182  Diastolic 68 79 66  Pulse 88 74 80                                          Physical exam  Recent Results (from the past 2160 hour(s))  Ferritin     Status: None   Collection Time: 05/14/21  3:36 PM  Result Value Ref Range   Ferritin 58 15 - 150 ng/mL  Iron and TIBC      Status: None   Collection Time: 05/14/21  3:36 PM  Result Value Ref Range   Total Iron Binding Capacity 344 250 - 450 ug/dL   UIBC 249 131 - 425 ug/dL   Iron 95 27 - 159 ug/dL   Iron Saturation 28 15 - 55 %  CBC with Differential/Platelet     Status: None   Collection Time: 05/14/21  3:36 PM  Result Value Ref Range   WBC 4.6 3.4 - 10.8 x10E3/uL   RBC 5.05 3.77 - 5.28 x10E6/uL   Hemoglobin 14.4 11.1 - 15.9 g/dL   Hematocrit 43.5 34.0 - 46.6 %   MCV 86 79 - 97 fL   MCH 28.5 26.6 - 33.0 pg   MCHC 33.1 31.5 - 35.7 g/dL   RDW 12.1 11.7 - 15.4 %   Platelets 271 150 - 450 x10E3/uL   Neutrophils 58 Not Estab. %   Lymphs 31 Not Estab. %   Monocytes 10 Not Estab. %   Eos 1 Not Estab. %   Basos 0 Not Estab. %   Neutrophils Absolute 2.6 1.4 - 7.0 x10E3/uL   Lymphocytes Absolute 1.4 0.7 - 3.1 x10E3/uL   Monocytes Absolute 0.5 0.1 - 0.9 x10E3/uL   EOS (ABSOLUTE) 0.1 0.0 - 0.4 x10E3/uL   Basophils Absolute 0.0 0.0 - 0.2 x10E3/uL   Immature Granulocytes 0 Not Estab. %   Immature Grans (Abs) 0.0 0.0 - 0.1 x10E3/uL  Vitamin D 25 hydroxy     Status: Abnormal   Collection Time: 05/14/21  3:36 PM  Result Value Ref Range   Vit  D, 25-Hydroxy 16.4 (L) 30.0 - 100.0 ng/mL    Comment: Vitamin D deficiency has been defined by the [place] practice guideline as a level of serum 25-OH vitamin D less than 20 ng/mL (1,2). The Endocrine Society went on to further define vitamin D insufficiency as a level between 21 and 29 ng/mL (2). 1. IOM (Institute of Medicine). 2010. Dietary reference    intakes for calcium and D. [place]: The    [employer]. 2. Holick MF, Binkley [place], Bischoff-Ferrari HA, et al.    Evaluation, treatment, and prevention of vitamin D    deficiency: an Endocrine Society clinical practice    guideline. JCEM. 2011 Jul; 96(7):1911-30.   TSH     Status: None   Collection Time: 07/02/21  8:19 AM  Result Value Ref Range   TSH 0.871  0.450 - 4.500 uIU/mL  T4, free     Status: None   Collection Time: 07/02/21  8:19 AM  Result Value Ref Range   Free T4 0.94 0.82 - 1.77 ng/dL  T3, free     Status: None   Collection Time: 07/02/21  8:19 AM  Result Value Ref Range   T3, Free 3.3 2.0 - 4.4 pg/mL       Thyroid uptake and scan on February 03, 2020 FINDINGS: Multinodular appearing thyroid lobes.   Dominant warm nodules at mid inferior RIGHT thyroid lobe with additional smaller warm nodules at the superior thyroid lobes bilaterally.   Small cold nodule seen mid to upper RIGHT lobe and mid LEFT lobe.   4 hour I-123 uptake = 13.0% (normal 5-20%),   24 hour I-123 uptake = 27.2% (normal 10-30%)   IMPRESSION: Multinodular thyroid gland.   Normal 4 hour and 24 hour radio iodine uptakes.    Thyroid ultrasound April 26, 2020 IMPRESSION: 1. Multinodular goiter. 2. Solid nodule in the mid right thyroid measuring up to 1.2 cm (labeled 3) highly suspicious for malignancy and meets criteria for tissue sampling (TI-RADS category 5). This nodule may correspond to the right mid to superior cold nodule described on recent nuclear medicine study. Recommend ultrasound-guided fine-needle aspiration. 3. Nodules labeled 2 (right superior), 5 (left superior), and 6 (left mid) meet criteria for annual ultrasound surveillance.    Fine-needle aspiration of a thyroid nodule on May 03, 2020 FINAL MICROSCOPIC DIAGNOSIS:  - Scant follicular epithelium present ([place] category I)   SPECIMEN ADEQUACY:  Satisfactory but limited for evaluation, scant cellularity   GROSS:  Received is/are 3 slides in 95% Ethyl alcohol, 3 air dried slides for  diff stain, and 30 ccs of clear colorless Cytolyt solution. (CM:cm)  Prepared:  Smears:  6  Concentration Method (Thin Prep):  1  Cell Block:  Cell block attempted, not obtained.    Her most recent thyroid ultrasound on January 18, 2021 IMPRESSION: 1. No significant interval change  in the size or appearance of the previously biopsied nodule in the right mid gland. Recommend correlation with prior biopsy results. 2. Stable 1 cm TI-RADS category 4 nodule in the left upper gland (labeled #5) which continues to meet criteria for imaging surveillance. Recommend follow-up ultrasound in 1 year. 3. Interval involution of nodules in the right upper and left lower gland which no longer meet criteria for continued surveillance. 4. No new nodules or suspicious findings.    Assessment & Plan:   1. Subclinical hyperthyroidism  2. MNG Her biopsy results are benign. Her previsit thyroid function tests are  consistent with treatment effect with methimazole.  She is advised to lower methimazole to 2.5 mg p.o. daily at breakfast.    Her prior 24-hour thyroid uptake was 27.2% with areas of increased uptake consistent with multinodular goiter.   She opted for antithyroid medications instead of ablative treatment.  She will have repeat thyroid function test before her next visit in 6 months. She will have repeat thyroid ultrasound 1 year from now for surveillance purposes. If her nodular features change, she may need repeat fine-needle aspiration. If her response to methimazole is not adequate, she will be considered for I-131 thyroid ablation in the future.   Side effects and precautions of methimazole were discussed with her.    -Patient is advised to maintain close follow up with Denyce Robert, FNP for primary care needs.    I spent 21 minutes in the care of the patient today including review of labs from Thyroid Function, CMP, and other relevant labs ; imaging/biopsy records (current and previous including abstractions from other facilities); face-to-face time discussing  her lab results and symptoms, medications doses, her options of short and long term treatment based on the latest standards of care / guidelines;   and documenting the encounter.  Yuka Shields-Lanier   participated in the discussions, expressed understanding, and voiced agreement with the above plans.  All questions were answered to her satisfaction. she is encouraged to contact clinic should she have any questions or concerns prior to her return visit.    Follow up plan: Return in about 2 weeks (around 05/15/2020) for F/U with Biopsy Results.   Thank you for involving me in the care of this pleasant patient, and I will continue to update you with her progress.  Glade Lloyd, MD [facility] Phone: [phone]  Fax: [phone]   05/01/2020, 12:00 PM  This note was partially dictated with voice recognition software. Similar sounding words can be transcribed inadequately or may not  be corrected upon review.

## 2021-08-27 ENCOUNTER — Emergency Department (HOSPITAL_COMMUNITY)
Admission: EM | Admit: 2021-08-27 | Discharge: 2021-08-27 | Disposition: A | Discharge status: Home / Self Care | Payer: Medicare HMO | Attending: Emergency Medicine | Admitting: Emergency Medicine

## 2021-08-27 ENCOUNTER — Encounter (HOSPITAL_COMMUNITY): Payer: Self-pay

## 2021-08-27 ENCOUNTER — Other Ambulatory Visit: Payer: Self-pay

## 2021-08-27 DIAGNOSIS — R197 Diarrhea, unspecified: Secondary | ICD-10-CM | POA: Insufficient documentation

## 2021-08-27 DIAGNOSIS — J45909 Unspecified asthma, uncomplicated: Secondary | ICD-10-CM | POA: Diagnosis not present

## 2021-08-27 DIAGNOSIS — Z20822 Contact with and (suspected) exposure to covid-19: Secondary | ICD-10-CM | POA: Diagnosis not present

## 2021-08-27 DIAGNOSIS — R7401 Elevation of levels of liver transaminase levels: Secondary | ICD-10-CM | POA: Diagnosis not present

## 2021-08-27 DIAGNOSIS — R112 Nausea with vomiting, unspecified: Secondary | ICD-10-CM

## 2021-08-27 LAB — CBC WITH DIFFERENTIAL/PLATELET
Abs Immature Granulocytes: 0.03 10*3/uL (ref 0.00–0.07)
Basophils Absolute: 0 10*3/uL (ref 0.0–0.1)
Basophils Relative: 0 %
Eosinophils Absolute: 0.1 10*3/uL (ref 0.0–0.5)
Eosinophils Relative: 1 %
HCT: 43.1 % (ref 36.0–46.0)
Hemoglobin: 14.5 g/dL (ref 12.0–15.0)
Immature Granulocytes: 0 %
Lymphocytes Relative: 8 %
Lymphs Abs: 0.6 10*3/uL — ABNORMAL LOW (ref 0.7–4.0)
MCH: 27.8 pg (ref 26.0–34.0)
MCHC: 33.6 g/dL (ref 30.0–36.0)
MCV: 82.7 fL (ref 80.0–100.0)
Monocytes Absolute: 0.7 10*3/uL (ref 0.1–1.0)
Monocytes Relative: 10 %
Neutro Abs: 5.4 10*3/uL (ref 1.7–7.7)
Neutrophils Relative %: 81 %
Platelets: 211 10*3/uL (ref 150–400)
RBC: 5.21 MIL/uL — ABNORMAL HIGH (ref 3.87–5.11)
RDW: 12 % (ref 11.5–15.5)
WBC: 6.7 10*3/uL (ref 4.0–10.5)
nRBC: 0 % (ref 0.0–0.2)

## 2021-08-27 LAB — COMPREHENSIVE METABOLIC PANEL
ALT: 70 U/L — ABNORMAL HIGH (ref 0–44)
AST: 61 U/L — ABNORMAL HIGH (ref 15–41)
Albumin: 4.3 g/dL (ref 3.5–5.0)
Alkaline Phosphatase: 152 U/L — ABNORMAL HIGH (ref 38–126)
Anion gap: 8 (ref 5–15)
BUN: 10 mg/dL (ref 6–20)
CO2: 23 mmol/L (ref 22–32)
Calcium: 9 mg/dL (ref 8.9–10.3)
Chloride: 106 mmol/L (ref 98–111)
Creatinine, Ser: 0.69 mg/dL (ref 0.44–1.00)
GFR, Estimated: >60 mL/min (ref >60)
Glucose, Bld: 125 mg/dL — ABNORMAL HIGH (ref 70–99)
Potassium: 3.4 mmol/L — ABNORMAL LOW (ref 3.5–5.1)
Sodium: 137 mmol/L (ref 135–145)
Total Bilirubin: 0.6 mg/dL (ref 0.3–1.2)
Total Protein: 8 g/dL (ref 6.5–8.1)

## 2021-08-27 LAB — RESP PANEL BY RT-PCR (FLU A&B, COVID) ARPGX2
Influenza A by PCR: NEGATIVE
Influenza B by PCR: NEGATIVE
SARS Coronavirus 2 by RT PCR: NEGATIVE

## 2021-08-27 MED ORDER — LOPERAMIDE HCL 2 MG PO CAPS
4.0000 mg | ORAL_CAPSULE | Freq: Once | ORAL | Status: DC
Start: 2021-08-27 — End: 2021-08-27
  Filled 2021-08-27: qty 2

## 2021-08-27 MED ORDER — ONDANSETRON 4 MG PO TBDP: 4.0000 mg | ORAL_TABLET | Freq: Once | ORAL | Status: DC

## 2021-08-27 MED ORDER — ONDANSETRON 4 MG PO TBDP: 4.0000 mg | ORAL_TABLET | Freq: Three times a day (TID) | ORAL | 0 refills | Status: DC | PRN

## 2021-08-27 MED ORDER — ONDANSETRON HCL 4 MG/2ML IJ SOLN
4.0000 mg | Freq: Once | INTRAMUSCULAR | Status: AC
  Administered 2021-08-27: 4 mg via INTRAVENOUS
  Filled 2021-08-27: qty 2

## 2021-08-27 MED ORDER — PROCHLORPERAZINE EDISYLATE 10 MG/2ML IJ SOLN
10.0000 mg | Freq: Once | INTRAMUSCULAR | Status: AC
  Administered 2021-08-27: 10 mg via INTRAVENOUS
  Filled 2021-08-27: qty 2

## 2021-08-27 MED ORDER — SODIUM CHLORIDE 0.9 % IV BOLUS
1000.0000 mL | Freq: Once | INTRAVENOUS | Status: AC
  Administered 2021-08-27: 1000 mL via INTRAVENOUS

## 2021-08-27 NOTE — ED Triage Notes (Addendum)
Pov from home. Cc of abdominal pain with n/v/d since 10pm. Says that she had some eggs with black dots. But didn't eat the eggs.  Emesis x5.  ?Also wants her 5d cough looked at. Son is sick with a cold ?

## 2021-08-27 NOTE — Discharge Instructions (Signed)
You were seen today for nausea, vomiting, and diarrhea.  Your lab work-up is reassuring.  Make sure that you are staying hydrated at home.  Take Zofran as needed for nausea and vomiting.  You may take Imodium for diarrhea. ?

## 2021-08-27 NOTE — ED Provider Notes (Signed)
?[facility] ?Provider Note ? ? ?CSN: [id number] ?Arrival date & time: 08/27/21  0233 ? ?  ? ?History ? ?Chief Complaint  ?Patient presents with  ? Abdominal Pain  ? ? ?Charlotte Mccormick is a 36 y.o. female. ? ?HPI ? ?  ? ?This is a 36-year-old female who presents with nausea, vomiting, diarrhea.  Onset of symptoms last night around 10 PM.  Patient reports that she boiled some eggs that she thought went bad.  She did not end up eating the eggs but she did handle the eggs.  Ever since that time she has had nausea, vomiting, and diarrhea.  She thinks this may be related.  She does report that her son had a cold at home.  She reports cough.  No chest pain, shortness of breath, fevers.  She reports some abdominal cramping.  She has not taken anything for her symptoms. ? ?Home Medications ?Prior to Admission medications   ?Medication Sig Start Date End Date Taking? Authorizing Provider  ?ondansetron (ZOFRAN-ODT) 4 MG disintegrating tablet Take 1 tablet (4 mg total) by mouth every 8 (eight) hours as needed for nausea or vomiting. 08/27/21  Yes Horton, Courtney F, MD  ?acetaminophen (TYLENOL) 500 MG tablet Take 500 mg by mouth every 6 (six) hours as needed for mild pain or moderate pain.    [provider]  ?albuterol (VENTOLIN HFA) 108 (90 Base) MCG/ACT inhaler Inhale 1 puff into the lungs every 6 (six) hours as needed for wheezing or shortness of breath.    [provider]  ?buPROPion (WELLBUTRIN XL) 300 MG 24 hr tablet Take 1 tablet (300 mg total) by mouth daily. 09/11/20 01/09/21  Pucilowski, Olgierd A, MD  ?dexlansoprazole (DEXILANT) 60 MG capsule Take 1 capsule (60 mg total) by mouth daily. 11/08/19   Boone, Anna W, NP  ?escitalopram (LEXAPRO) 10 MG tablet Take 1 tablet (10 mg total) by mouth daily. 10/16/20 01/14/21  Pucilowski, Olgierd A, MD  ?linaclotide (LINZESS) 72 MCG capsule Take 1 capsule (72 mcg total) by mouth daily before breakfast. 10/26/20   Boone, Anna W, NP  ?meloxicam  (MOBIC) 15 MG tablet Take 15 mg by mouth daily as needed for pain. 01/21/19   [provider]  ?methimazole (TAPAZOLE) 5 MG tablet Take 0.5 tablets (2.5 mg total) by mouth daily. 07/19/21   Nida, Gebreselassie W, MD  ?prazosin (MINIPRESS) 5 MG capsule Take 1 capsule (5 mg total) by mouth at bedtime. 09/11/20 01/09/21  Pucilowski, Olgierd A, MD  ?Relugolix-Estradiol-Norethind (MYFEMBREE) 40-1-0.5 MG TABS Take 1 tablet by mouth daily. 12/22/20   Eure, Luther H, MD  ?rizatriptan (MAXALT) 10 MG tablet Take 1 tablet by mouth daily as needed. At onset of headache 08/17/19   [provider]  ?LO LOESTRIN FE 1 MG-10 MCG / 10 MCG tablet Take 1 tablet by mouth daily. ?Patient not taking: Reported on 10/13/2019 02/15/19 10/29/19  Booker, Kimberly R, CNM  ?zolpidem (AMBIEN CR) 12.5 MG CR tablet Take 1 tablet (12.5 mg total) by mouth at bedtime as needed for sleep. ?Patient not taking: No sig reported 09/20/19 10/29/19  Pucilowski, Olgierd A, MD  ?   ? ?Allergies    ?Antihistamines, diphenhydramine-type; Bee venom; Hydrocodone; Penicillins; Venofer [iron sucrose]; and Reglan [metoclopramide]   ? ?Review of Systems   ?Review of Systems  ?Constitutional:  Positive for chills.  ?Gastrointestinal:  Positive for abdominal pain, diarrhea, nausea and vomiting.  ?All other systems reviewed and are negative. ? ?Physical Exam ?Updated Vital Signs ?BP   91/78   Pulse (!) 108   Temp 98.1 ?F (36.7 ?C)   Resp 18   Ht 1.575 m (5' 2")   Wt 81.6 kg   LMP 08/06/2021 (Approximate)   SpO2 100%   BMI 32.92 kg/m?  ?Physical Exam ?Vitals and nursing note reviewed.  ?Constitutional:   ?   Appearance: She is well-developed. She is obese. She is not ill-appearing.  ?HENT:  ?   Head: Normocephalic and atraumatic.  ?   Mouth/Throat:  ?   Mouth: Mucous membranes are moist.  ?Eyes:  ?   Pupils: Pupils are equal, round, and reactive to light.  ?Cardiovascular:  ?   Rate and Rhythm: Normal rate and regular rhythm.  ?   Heart sounds: Normal heart  sounds.  ?Pulmonary:  ?   Effort: Pulmonary effort is normal. No respiratory distress.  ?   Breath sounds: No wheezing.  ?Abdominal:  ?   General: Bowel sounds are normal.  ?   Palpations: Abdomen is soft.  ?   Tenderness: There is no abdominal tenderness. There is no guarding or rebound.  ?Musculoskeletal:  ?   Cervical back: Neck supple.  ?Skin: ?   General: Skin is warm and dry.  ?Neurological:  ?   Mental Status: She is alert and oriented to person, place, and time.  ? ? ?ED Results / Procedures / Treatments   ?Labs ?(all labs ordered are listed, but only abnormal results are displayed) ?Labs Reviewed  ?CBC WITH DIFFERENTIAL/PLATELET - Abnormal; Notable for the following components:  ?    Result Value  ? RBC 5.21 (*)   ? Lymphs Abs 0.6 (*)   ? All other components within normal limits  ?COMPREHENSIVE METABOLIC PANEL - Abnormal; Notable for the following components:  ? Potassium 3.4 (*)   ? Glucose, Bld 125 (*)   ? AST 61 (*)   ? ALT 70 (*)   ? Alkaline Phosphatase 152 (*)   ? All other components within normal limits  ?RESP PANEL BY RT-PCR (FLU A&B, COVID) ARPGX2  ?PREGNANCY, URINE  ?URINALYSIS, ROUTINE W REFLEX MICROSCOPIC  ? ? ?EKG ?None ? ?Radiology ?No results found. ? ?Procedures ?Procedures  ? ? ?Medications Ordered in ED ?Medications  ?loperamide (IMODIUM) capsule 4 mg (has no administration in time range)  ?ondansetron [facility]) injection 4 mg (4 mg Intravenous Given 08/27/21 0323)  ?prochlorperazine (COMPAZINE) injection 10 mg (10 mg Intravenous Given 08/27/21 0434)  ?sodium chloride 0.9 % bolus 1,000 mL (1,000 mLs Intravenous New Bag/Given 08/27/21 0510)  ? ? ?ED Course/ Medical Decision Making/ A&P ?  ?                        ?Medical Decision Making ?Amount and/or Complexity of Data Reviewed ?Labs: ordered. ? ?Risk ?Prescription drug management. ? ? ?This patient presents to the ED for concern of nausea, vomiting, diarrhea, this involves an extensive number of treatment options, and is a complaint that  carries with it a high risk of complications and morbidity.  The differential diagnosis includes gastroenteritis, gastritis, less likely obstructive pathology, appendicitis, cholecystitis ? ?MDM:   ? ?This is a 36 year old female who presents with onset of nausea, vomiting, and diarrhea last night.  She is nontoxic.  Initial vital signs are reassuring.  She is actively vomiting.  She reports some abdominal pain but her abdominal exam is benign without objective tenderness.  She reports that the son is sick at home with some viral symptoms.  She is overall nontoxic-appearing.  Initially she was given IV Zofran.  COVID and influenza testing negative.  She subsequently continued to vomit and pooped in the trash can in the room.  IV was placed and she was given fluids as well as Compazine.  Labs obtained.  No significant leukocytosis.  Metabolic panel with K of 3.4.  LFTs slightly elevated at AST of 61, ALT of 70, alk phos of 152.  In the absence of abdominal tenderness, favor this as reactive and related to viral etiology.  Less likely gallbladder pathology.   Patient was able to take sips of water.  She declined Imodium.  Recommended hydration at home and supportive measures with Zofran. ?(Labs, imaging) ? ?Labs: ?I Ordered, and personally interpreted labs.  The pertinent results include: Normal white count, AST 61, ALT 70, alk phos 152 ? ?Imaging Studies ordered: ?I ordered imaging studies including none ?I independently visualized and interpreted imaging. ?I agree with the radiologist interpretation ? ?Additional history obtained from chart review.  External records from outside source obtained and reviewed including prior visits ? ?Critical Interventions: ?IV fluids, nausea medication ? ?Consultations: ?I requested consultation with the none,  and discussed lab and imaging findings as well as pertinent plan - they recommend: None ? ?Cardiac Monitoring: ?The patient was maintained on a cardiac monitor.  I personally  viewed and interpreted the cardiac monitored which showed an underlying rhythm of: Normal sinus rhythm ? ?Reevaluation: ?After the interventions noted above, I reevaluated the patient and found that they have :im

## 2021-09-03 ENCOUNTER — Encounter (HOSPITAL_COMMUNITY): Payer: Self-pay | Admitting: Hematology

## 2021-09-21 ENCOUNTER — Telehealth (HOSPITAL_COMMUNITY): Payer: Medicare HMO | Admitting: Physician Assistant

## 2021-09-24 ENCOUNTER — Other Ambulatory Visit (HOSPITAL_COMMUNITY): Payer: Self-pay | Admitting: *Deleted

## 2021-09-24 DIAGNOSIS — D500 Iron deficiency anemia secondary to blood loss (chronic): Secondary | ICD-10-CM

## 2021-09-24 DIAGNOSIS — E559 Vitamin D deficiency, unspecified: Secondary | ICD-10-CM

## 2021-09-25 LAB — T4, FREE: Free T4: 1.05 ng/dL (ref 0.82–1.77)

## 2021-09-25 LAB — T3, FREE: T3, Free: 3.6 pg/mL (ref 2.0–4.4)

## 2021-09-25 LAB — TSH: TSH: 0.136 u[IU]/mL — ABNORMAL LOW (ref 0.450–4.500)

## 2021-09-25 NOTE — Progress Notes (Addendum)
? ?Virtual Visit via Telephone Note ?[place] ? ?I connected with Jury Mccormick  on 09/26/21 at  11:48 AM by telephone and verified that I am speaking with the correct person using two identifiers. ? ?Location: ?Patient: Home ?Provider: [facility] ?  ?I discussed the limitations, risks, security and privacy concerns of performing an evaluation and management service by telephone and the availability of in person appointments. I also discussed with the patient that there may be a patient responsible charge related to this service. The patient expressed understanding and agreed to proceed. ? ? ?HISTORY OF PRESENT ILLNESS: ?[name] (36 year old female) follows at our clinic for iron deficiency anemia requiring intermittent IV iron infusions.  She was last evaluated via telemedicine visit by Tarri Abernethy PA-C on 05/20/2021. ? ?At today's visit, she reports feeling fatigued.  She has also been having daily headaches for the past week.  She has moderate menstrual cycles that last 4-5 days; periods were much heavier prior to starting Myfembree.  No melena, hematochezia, or epistaxis. ?She denies any pica, restless legs, chest pain, dyspnea on exertion, or syncope. ?She denies any fevers, chills, night sweats, or unintentional weight loss.  No recent of recurrent infections. ?She has not been taking her Vitamin D supplements. ?She reports 60% energy and 80% appetite.  She is maintaining a stable weight at this time. ?  ? ?OBSERVATIONS/OBJECTIVE: ?Review of Systems  ?Constitutional:  Positive for malaise/fatigue. Negative for chills, diaphoresis, fever and weight loss.  ?Respiratory:  Positive for cough. Negative for shortness of breath.   ?Cardiovascular:  Negative for chest pain and palpitations.  ?Gastrointestinal:  Positive for constipation. Negative for abdominal pain, blood in stool, melena, nausea and vomiting.  ?Neurological:  Positive for headaches.  Negative for dizziness.  ?Psychiatric/Behavioral:  Positive for depression. The patient is nervous/anxious.    ? ?PHYSICAL EXAM (per limitations of virtual telephone visit): The patient is alert and oriented x 3, exhibiting adequate mentation, good mood, and ability to speak in full sentences and execute sound judgement. ? ? ?ASSESSMENT & PLAN: ?1.  Microcytic anemia with iron deficiency ?- Longstanding history of anemia, most likely secondary to menorrhagia  ?- EGD was 01/17/2020 which showed gastritis without clear signs of bleeding ?- Colonoscopy on 07/24/2020 showed nonbleeding internal hemorrhoids and diverticulosis, but was negative for signs of bleeding. ?- Capsule study in April 2022 was unremarkable ?- Other causes for anemia were investigated, but unremarkable (normal SPEP, haptoglobin, B12, folate, copper, LDH) ?- Failed oral iron therapy due to severe constipation and lack of improvement ?- Denies any GI blood loss within the past 6 months; started on Myfembree by GYN, reports more regular periods now - lasts 4-5 days  ?- Moderate fatigue with energy about 60% ?- Most recent IV iron with Feraheme on 11/30/2020.  Patient had reaction to Venofer on 10/24/2020, with nausea, vomiting, diaphoresis.   ?- Most recent labs (obtained via [facility] on 09/24/2021): Hgb 13.8/MCV 80, ferritin 15, iron saturation 15% ?- PLAN: Recommend IV Feraheme x2 (with PREMEDICATION due to reaction to Venofer) ?- We will repeat labs and office visit in 4 months ?  ?2.  Vitamin D deficiency ?- She has not been taking her vitamin D supplementation.  (Recommended vitamin D 2000 units OTC at last visit) ?- Most recent vitamin D (09/24/2021): Low at 21.9 ?- PLAN: Recommend restarting OTC vitamin D 2000 units daily.  Check vitamin D at follow-up in 4 months ? ?  ?3.  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  No further work-up or treatment indicated. ?  ?4. Social and family history ?-Patient's father has cancer of unknown type.  He is also anemic, has received multiple blood transfusions over the past year. ?-Patient is a lifelong non-smoker.  Does not use drugs or alcohol ? ?  ?PLAN SUMMARY: ?* Feraheme x2 ?* Labs (CBC, iron/TIBC, ferritin, vitamin D) in 4 months (via [facility] per patient request) ?* Office visit after labs ? ?I discussed the assessment and treatment plan with the patient. The patient was provided an opportunity to ask questions and all were answered. The patient agreed with the plan and demonstrated an understanding of the instructions. ?  ?The patient was advised to call back or seek an in-person evaluation if the symptoms worsen or if the condition fails to improve as anticipated. ? ?I provided 22 minutes of non-face-to-face time during this encounter. ? ?Harriett Rush, PA-C ?09/26/21 1:06 PM  ?

## 2021-09-26 ENCOUNTER — Inpatient Hospital Stay (HOSPITAL_COMMUNITY): Payer: Medicare HMO | Attending: Physician Assistant | Admitting: Physician Assistant

## 2021-09-26 DIAGNOSIS — E559 Vitamin D deficiency, unspecified: Secondary | ICD-10-CM

## 2021-09-26 DIAGNOSIS — D500 Iron deficiency anemia secondary to blood loss (chronic): Secondary | ICD-10-CM

## 2021-09-27 ENCOUNTER — Encounter (HOSPITAL_COMMUNITY): Payer: Self-pay | Admitting: Hematology

## 2021-10-04 ENCOUNTER — Encounter (HOSPITAL_COMMUNITY): Payer: Self-pay | Admitting: Hematology

## 2021-10-08 ENCOUNTER — Inpatient Hospital Stay (HOSPITAL_COMMUNITY): Payer: Medicare HMO | Attending: Physician Assistant

## 2021-10-08 VITALS — BP 106/65 | HR 72 | Temp 98.0°F | Resp 18

## 2021-10-08 DIAGNOSIS — D500 Iron deficiency anemia secondary to blood loss (chronic): Secondary | ICD-10-CM

## 2021-10-08 DIAGNOSIS — D509 Iron deficiency anemia, unspecified: Secondary | ICD-10-CM | POA: Diagnosis present

## 2021-10-08 MED ORDER — SODIUM CHLORIDE 0.9 % IV SOLN: Freq: Once | INTRAVENOUS | Status: AC

## 2021-10-08 MED ORDER — FAMOTIDINE IN NACL 20-0.9 MG/50ML-% IV SOLN
20.0000 mg | Freq: Once | INTRAVENOUS | Status: AC
  Administered 2021-10-08: 20 mg via INTRAVENOUS
  Filled 2021-10-08: qty 50

## 2021-10-08 MED ORDER — SODIUM CHLORIDE 0.9 % IV SOLN
510.0000 mg | Freq: Once | INTRAVENOUS | Status: AC
  Administered 2021-10-08: 510 mg via INTRAVENOUS
  Filled 2021-10-08: qty 17

## 2021-10-08 MED ORDER — ACETAMINOPHEN 325 MG PO TABS
650.0000 mg | ORAL_TABLET | Freq: Once | ORAL | Status: AC
  Administered 2021-10-08: 650 mg via ORAL
  Filled 2021-10-08: qty 2

## 2021-10-08 MED ORDER — LORATADINE 10 MG PO TABS
10.0000 mg | ORAL_TABLET | Freq: Once | ORAL | Status: AC
  Administered 2021-10-08: 10 mg via ORAL
  Filled 2021-10-08: qty 1

## 2021-10-08 MED ORDER — METHYLPREDNISOLONE SODIUM SUCC 125 MG IJ SOLR
125.0000 mg | Freq: Once | INTRAMUSCULAR | Status: AC
  Administered 2021-10-08: 125 mg via INTRAVENOUS
  Filled 2021-10-08: qty 2

## 2021-10-08 NOTE — Progress Notes (Signed)
Patient presents today for Feraheme infusion per provider order.  Vital signs within parameters for treatment.  Patient has no new complaints at this time. ? ?Peripheral IV started and blood return noted pre and post infusion.     ? ?Feraheme infusion given today per MD orders.  Stable during infusion without adverse affects.  Vital signs stable.  No complaints at this time.  Discharge from clinic ambulatory in stable condition.  Alert and oriented X 3.  Follow up with [facility] as scheduled.  ?

## 2021-10-08 NOTE — Patient Instructions (Signed)
[facility]  Discharge Instructions: Thank you for choosing [facility] to provide your oncology and hematology care.  If you have a lab appointment with the Cancer Center, please come in thru the Main Entrance and check in at the main information desk.  Wear comfortable clothing and clothing appropriate for easy access to any Portacath or PICC line.   We strive to give you quality time with your provider. You may need to reschedule your appointment if you arrive late (15 or more minutes).  Arriving late affects you and other patients whose appointments are after yours.  Also, if you miss three or more appointments without notifying the office, you may be dismissed from the clinic at the provider's discretion.      For prescription refill requests, have your pharmacy contact our office and allow 72 hours for refills to be completed.    Today you received the following chemotherapy and/or immunotherapy agents Venofer      To help prevent nausea and vomiting after your treatment, we encourage you to take your nausea medication as directed.  BELOW ARE SYMPTOMS THAT SHOULD BE REPORTED IMMEDIATELY: *FEVER GREATER THAN 100.4 F (38 C) OR HIGHER *CHILLS OR SWEATING *NAUSEA AND VOMITING THAT IS NOT CONTROLLED WITH YOUR NAUSEA MEDICATION *UNUSUAL SHORTNESS OF BREATH *UNUSUAL BRUISING OR BLEEDING *URINARY PROBLEMS (pain or burning when urinating, or frequent urination) *BOWEL PROBLEMS (unusual diarrhea, constipation, pain near the anus) TENDERNESS IN MOUTH AND THROAT WITH OR WITHOUT PRESENCE OF ULCERS (sore throat, sores in mouth, or a toothache) UNUSUAL RASH, SWELLING OR PAIN  UNUSUAL VAGINAL DISCHARGE OR ITCHING   Items with * indicate a potential emergency and should be followed up as soon as possible or go to the Emergency Department if any problems should occur.  Please show the CHEMOTHERAPY ALERT CARD or IMMUNOTHERAPY ALERT CARD at check-in to the Emergency  Department and triage nurse.  Should you have questions after your visit or need to cancel or reschedule your appointment, please contact [facility] [phone]  and follow the prompts.  Office hours are 8:00 a.m. to 4:30 p.m. Monday - Friday. Please note that voicemails left after 4:00 p.m. may not be returned until the following business day.  We are closed weekends and major holidays. You have access to a nurse at all times for urgent questions. Please call the main number to the clinic [phone] and follow the prompts.  For any non-urgent questions, you may also contact your provider using MyChart. We now offer e-Visits for anyone 18 and older to request care online for non-urgent symptoms. For details visit [email or web address].   Also download the MyChart app! Go to the app store, search "MyChart", open the app, select [facility], and log in with your MyChart username and password.  Due to Covid, a mask is required upon entering the hospital/clinic. If you do not have a mask, one will be given to you upon arrival. For doctor visits, patients may have 1 support person aged 18 or older with them. For treatment visits, patients cannot have anyone with them due to current Covid guidelines and our immunocompromised population.  

## 2021-10-16 ENCOUNTER — Inpatient Hospital Stay (HOSPITAL_COMMUNITY): Payer: Medicare HMO

## 2021-10-16 VITALS — BP 121/85 | HR 84 | Temp 98.3°F | Resp 18

## 2021-10-16 DIAGNOSIS — D500 Iron deficiency anemia secondary to blood loss (chronic): Secondary | ICD-10-CM

## 2021-10-16 DIAGNOSIS — D509 Iron deficiency anemia, unspecified: Secondary | ICD-10-CM | POA: Diagnosis not present

## 2021-10-16 MED ORDER — LORATADINE 10 MG PO TABS
10.0000 mg | ORAL_TABLET | Freq: Once | ORAL | Status: AC
  Administered 2021-10-16: 10 mg via ORAL
  Filled 2021-10-16: qty 1

## 2021-10-16 MED ORDER — ACETAMINOPHEN 325 MG PO TABS
650.0000 mg | ORAL_TABLET | Freq: Once | ORAL | Status: AC
  Administered 2021-10-16: 650 mg via ORAL
  Filled 2021-10-16: qty 2

## 2021-10-16 MED ORDER — FAMOTIDINE IN NACL 20-0.9 MG/50ML-% IV SOLN
20.0000 mg | Freq: Once | INTRAVENOUS | Status: AC
  Administered 2021-10-16: 20 mg via INTRAVENOUS
  Filled 2021-10-16: qty 50

## 2021-10-16 MED ORDER — SODIUM CHLORIDE 0.9 % IV SOLN
510.0000 mg | Freq: Once | INTRAVENOUS | Status: AC
  Administered 2021-10-16: 510 mg via INTRAVENOUS
  Filled 2021-10-16: qty 510

## 2021-10-16 MED ORDER — SODIUM CHLORIDE 0.9 % IV SOLN: Freq: Once | INTRAVENOUS | Status: AC

## 2021-10-16 MED ORDER — METHYLPREDNISOLONE SODIUM SUCC 125 MG IJ SOLR
125.0000 mg | Freq: Once | INTRAMUSCULAR | Status: AC
  Administered 2021-10-16: 125 mg via INTRAVENOUS
  Filled 2021-10-16: qty 2

## 2021-10-16 NOTE — Patient Instructions (Signed)
[facility]  Discharge Instructions: °Thank you for choosing [facility] to provide your oncology and hematology care.  °If you have a lab appointment with the Cancer Center, please come in thru the Main Entrance and check in at the main information desk. ° °Wear comfortable clothing and clothing appropriate for easy access to any Portacath or PICC line.  ° °We strive to give you quality time with your provider. You may need to reschedule your appointment if you arrive late (15 or more minutes).  Arriving late affects you and other patients whose appointments are after yours.  Also, if you miss three or more appointments without notifying the office, you may be dismissed from the clinic at the provider’s discretion.    °  °For prescription refill requests, have your pharmacy contact our office and allow 72 hours for refills to be completed.   ° °Today you received the following chemotherapy and/or immunotherapy agents feraheme    °  °To help prevent nausea and vomiting after your treatment, we encourage you to take your nausea medication as directed. ° °BELOW ARE SYMPTOMS THAT SHOULD BE REPORTED IMMEDIATELY: °*FEVER GREATER THAN 100.4 F (38 °C) OR HIGHER °*CHILLS OR SWEATING °*NAUSEA AND VOMITING THAT IS NOT CONTROLLED WITH YOUR NAUSEA MEDICATION °*UNUSUAL SHORTNESS OF BREATH °*UNUSUAL BRUISING OR BLEEDING °*URINARY PROBLEMS (pain or burning when urinating, or frequent urination) °*BOWEL PROBLEMS (unusual diarrhea, constipation, pain near the anus) °TENDERNESS IN MOUTH AND THROAT WITH OR WITHOUT PRESENCE OF ULCERS (sore throat, sores in mouth, or a toothache) °UNUSUAL RASH, SWELLING OR PAIN  °UNUSUAL VAGINAL DISCHARGE OR ITCHING  ° °Items with * indicate a potential emergency and should be followed up as soon as possible or go to the Emergency Department if any problems should occur. ° °Please show the CHEMOTHERAPY ALERT CARD or IMMUNOTHERAPY ALERT CARD at check-in to the Emergency  Department and triage nurse. ° °Should you have questions after your visit or need to cancel or reschedule your appointment, please contact [facility] [phone]  and follow the prompts.  Office hours are 8:00 a.m. to 4:30 p.m. Monday - Friday. Please note that voicemails left after 4:00 p.m. may not be returned until the following business day.  We are closed weekends and major holidays. You have access to a nurse at all times for urgent questions. Please call the main number to the clinic [phone] and follow the prompts. ° °For any non-urgent questions, you may also contact your provider using MyChart. We now offer e-Visits for anyone 18 and older to request care online for non-urgent symptoms. For details visit [email or web address]. °  °Also download the MyChart app! Go to the app store, search "MyChart", open the app, select [facility], and log in with your MyChart username and password. ° °Due to Covid, a mask is required upon entering the hospital/clinic. If you do not have a mask, one will be given to you upon arrival. For doctor visits, patients may have 1 support person aged 18 or older with them. For treatment visits, patients cannot have anyone with them due to current Covid guidelines and our immunocompromised population.  °

## 2021-12-26 NOTE — Congregational Nurse Program (Signed)
pt shopped at [employer]site [employer] for family of 5 for return visit

## 2022-01-16 ENCOUNTER — Other Ambulatory Visit: Payer: Self-pay | Admitting: Obstetrics & Gynecology

## 2022-01-16 ENCOUNTER — Other Ambulatory Visit: Payer: Self-pay | Admitting: *Deleted

## 2022-01-16 ENCOUNTER — Other Ambulatory Visit: Payer: Self-pay

## 2022-01-16 DIAGNOSIS — E559 Vitamin D deficiency, unspecified: Secondary | ICD-10-CM

## 2022-01-16 DIAGNOSIS — D500 Iron deficiency anemia secondary to blood loss (chronic): Secondary | ICD-10-CM

## 2022-01-16 DIAGNOSIS — E0520 Thyrotoxicosis with toxic multinodular goiter without thyrotoxic crisis or storm: Secondary | ICD-10-CM

## 2022-01-16 NOTE — Addendum Note (Signed)
Addended by: Renda Rolls A on: 01/16/2022 02:47 PM   Modules accepted: Orders

## 2022-01-17 LAB — TSH: TSH: 0.334 u[IU]/mL — ABNORMAL LOW (ref 0.450–4.500)

## 2022-01-17 LAB — T4, FREE: Free T4: 1.03 ng/dL (ref 0.82–1.77)

## 2022-01-17 LAB — T3, FREE: T3, Free: 3.4 pg/mL (ref 2.0–4.4)

## 2022-01-22 ENCOUNTER — Encounter: Payer: Self-pay | Admitting: "Endocrinology

## 2022-01-22 ENCOUNTER — Ambulatory Visit: Payer: Medicare HMO | Admitting: "Endocrinology

## 2022-01-22 VITALS — BP 106/78 | HR 84 | Ht 62.0 in | Wt 180.8 lb

## 2022-01-22 DIAGNOSIS — E042 Nontoxic multinodular goiter: Secondary | ICD-10-CM

## 2022-01-22 DIAGNOSIS — E0520 Thyrotoxicosis with toxic multinodular goiter without thyrotoxic crisis or storm: Secondary | ICD-10-CM

## 2022-01-22 DIAGNOSIS — E559 Vitamin D deficiency, unspecified: Secondary | ICD-10-CM | POA: Diagnosis not present

## 2022-01-22 MED ORDER — VITAMIN D (ERGOCALCIFEROL) 1.25 MG (50000 UNIT) PO CAPS: 50000.0000 [IU] | ORAL_CAPSULE | ORAL | 0 refills | Status: DC

## 2022-01-22 MED ORDER — METHIMAZOLE 5 MG PO TABS: 2.5000 mg | ORAL_TABLET | Freq: Every day | ORAL | 1 refills | Status: DC

## 2022-01-22 NOTE — Progress Notes (Signed)
05/01/2020              Endocrinology follow-up note   Subjective:    Patient ID: Charlotte Mccormick, female    DOB: 20-Aug-1985, PCP Denyce Robert, FNP.  Past Medical History:  Diagnosis Date   Abnormal uterine bleeding (AUB) 12/12/2015   Anemia    Anxiety    Asthma    Constipation    Depression    Fibroid    Genital herpes    GERD (gastroesophageal reflux disease)    History of chlamydia    Hyperemesis    IBS (irritable bowel syndrome)    Insomnia    Mental disorder    PTSD, ANXIETY,Depression   PTSD (post-traumatic stress disorder)    Sinusitis    Thyroid disease    Vaginal dryness 12/12/2015   Past Surgical History:  Procedure Laterality Date   COLONOSCOPY     2016   COLONOSCOPY N/A 04/08/2016   Procedure: COLONOSCOPY;  Surgeon: Danie Binder, MD;  Location: [facility];  Service: Endoscopy;  Laterality: N/A;  10:15 AM   COLONOSCOPY WITH PROPOFOL N/A 07/24/2020   Procedure: COLONOSCOPY WITH PROPOFOL;  Surgeon: [name], DO;  Location: [facility];  Service: Endoscopy;  Laterality: N/A;  11:00am   ESOPHAGOGASTRODUODENOSCOPY N/A 04/08/2016   Procedure: ESOPHAGOGASTRODUODENOSCOPY (EGD);  Surgeon: Danie Binder, MD;  Location: [facility];  Service: Endoscopy;  Laterality: N/A;   ESOPHAGOGASTRODUODENOSCOPY (EGD) WITH PROPOFOL N/A 01/17/2020   Procedure: ESOPHAGOGASTRODUODENOSCOPY (EGD) WITH PROPOFOL;  Surgeon: [name], DO;  Location: [facility];  Service: Endoscopy;  Laterality: N/A;  8:15am   GIVENS CAPSULE STUDY N/A 09/28/2020   Procedure: GIVENS CAPSULE STUDY;  Surgeon: [name], DO;  Location: [facility];  Service: Endoscopy;  Laterality: N/A;  7:30am   PERINEUM REPAIR     UPPER GASTROINTESTINAL ENDOSCOPY  2016   Social Connections: Not on file   Social History   Socioeconomic History   Marital status: Married    Spouse name: Not on file   Number of children: 3   Years of education: Not on file   Highest  education level: Not on file  Occupational History   Not on file  Tobacco Use   Smoking status: Former    Packs/day: 0.50    Years: 5.00    Total pack years: 2.50    Types: Cigarettes    Quit date: 06/03/2010    Years since quitting: 11.6   Smokeless tobacco: Never  Vaping Use   Vaping Use: Never used  Substance and Sexual Activity   Alcohol use: Not Currently    Alcohol/week: 2.0 standard drinks of alcohol    Types: 2 Glasses of wine per week    Comment: occ wine; denied 11/02/19   Drug use: Not Currently    Types: Marijuana    Comment: 11/02/19 states she used gummies 1-2 times a month.   Sexual activity: Yes    Birth control/protection: Surgical    Comment: vasectomy  Other Topics Concern   Not on file  Social History Narrative   ** Merged History Encounter **       Social Determinants of Health   Financial Resource Strain: Not on file  Food Insecurity: Not on file  Transportation Needs: Not on file  Physical Activity: Not on file  Stress: Not on file  Social Connections: Not on file  Intimate Partner Violence: Not on file   Family History  Problem Relation  Age of Onset   Heart disease Mother    Hypertension Mother    Hyperlipidemia Mother    Heart attack Mother    Heart failure Mother    Hypertension Father    Hyperlipidemia Father    Diabetes Father    Cancer Father    Thyroid disease Father    Stroke Father    Epilepsy Brother    Eczema Daughter    Autism spectrum disorder Son    Colon cancer Neg Hx    Current Outpatient Medications on File Prior to Visit  Medication Sig Dispense Refill   acetaminophen (TYLENOL) 500 MG tablet Take 500 mg by mouth every 6 (six) hours as needed for mild pain or moderate pain.     albuterol (VENTOLIN HFA) 108 (90 Base) MCG/ACT inhaler Inhale 1 puff into the lungs every 6 (six) hours as needed for wheezing or shortness of breath.     buPROPion (WELLBUTRIN XL) 300 MG 24 hr tablet Take 1 tablet (300 mg total) by mouth daily.  30 tablet 3   dexlansoprazole (DEXILANT) 60 MG capsule Take 1 capsule (60 mg total) by mouth daily. (Patient not taking: Reported on 01/22/2022) 90 capsule 3   escitalopram (LEXAPRO) 10 MG tablet Take 1 tablet (10 mg total) by mouth daily. 30 tablet 2   fluticasone (FLONASE) 50 MCG/ACT nasal spray SMARTSIG:1-2 Spray(s) Both Nares Daily     meloxicam (MOBIC) 15 MG tablet Take 15 mg by mouth daily as needed for pain.     montelukast (SINGULAIR) 10 MG tablet Take 10 mg by mouth daily.     MYFEMBREE 40-1-0.5 MG TABS Take 1 tablet by mouth once daily 30 tablet 11   ondansetron (ZOFRAN-ODT) 4 MG disintegrating tablet Take 1 tablet (4 mg total) by mouth every 8 (eight) hours as needed for nausea or vomiting. 20 tablet 0   prazosin (MINIPRESS) 5 MG capsule Take 1 capsule (5 mg total) by mouth at bedtime. 30 capsule 3   rizatriptan (MAXALT) 10 MG tablet Take 1 tablet by mouth daily as needed. At onset of headache     [DISCONTINUED] LO LOESTRIN FE 1 MG-10 MCG / 10 MCG tablet Take 1 tablet by mouth daily. (Patient not taking: Reported on 10/13/2019) 3 Package 3   [DISCONTINUED] zolpidem (AMBIEN CR) 12.5 MG CR tablet Take 1 tablet (12.5 mg total) by mouth at bedtime as needed for sleep. (Patient not taking: No sig reported) 30 tablet 2   No current facility-administered medications on file prior to visit.   Allergies  Allergen Reactions   Antihistamines, Diphenhydramine-Type Anxiety    Patient reports she cannot tolerate diphenhydramine with symptoms of anxiety   Bee Venom Swelling and Other (See Comments)    Reaction:  Localized swelling    Hydrocodone Itching   Penicillins Itching and Other (See Comments)    Has patient had a PCN reaction causing immediate rash, facial/tongue/throat swelling, SOB or lightheadedness with hypotension: No Has patient had a PCN reaction causing severe rash involving mucus membranes or skin necrosis: No Has patient had a PCN reaction that required hospitalization No Has  patient had a PCN reaction occurring within the last 10 years: Yes If all of the above answers are "NO", then may proceed with Cephalosporin use.    Venofer [Iron Sucrose] Nausea And Vomiting and Other (See Comments)    Diaphoresis and vomiting   Reglan [Metoclopramide] Anxiety     HPI  Brandalyn Harting is 36 year old female patient who is returning to follow-up for  toxic multinodular goiter.  She is currently on methimazole 5 mg p.o. daily to control relapsing, clinically significant thyrotoxicosis.     While she was undergoing work-up for hyperthyroidism, she was found to have multinodular goiter with 1 suspicious nodule.  She was sent for fine-needle aspiration biopsy which she underwent on May 03, 2020.  Results where significant for scant follicular epithelium, satisfactory but limited for evaluation due to scant cellularity.  Her subsequent work-up with thyroid uptake and scan  was  consistent with areas of increased activity, overall uptake was 27%.  She wished to avoid ablative treatment and went on higher dose of methimazole as an option.   After she has taken methimazole low-dose for clinical response, she will run out on recent intervention few days prior to her lab work  which still show subclinical hyperthyroidism.   She denies anxiety, palpitations, heat intolerance. she denies dysphagia, choking, shortness of breath, no recent voice change.  She did not have subsequent repeat ultrasound.   she has family history of thyroid dysfunction in her father, but denies family hx of thyroid cancer. she denies personal history of goiter. she is not on any anti-thyroid medications nor on any thyroid hormone supplements.                           Review of systems     Objective:       01/22/2022    9:25 AM 10/16/2021    3:40 PM 10/16/2021    2:05 PM  Vitals with BMI  Height '5\' 2"'$     Weight 180 lbs 13 oz    BMI 95.62    Systolic 130 865 784  Diastolic 78 85 74  Pulse  84 84 80                                          Physical exam  Recent Results (from the past 2160 hour(s))  TSH     Status: Abnormal   Collection Time: 01/16/22  3:49 PM  Result Value Ref Range   TSH 0.334 (L) 0.450 - 4.500 uIU/mL  T4, Free     Status: None   Collection Time: 01/16/22  3:49 PM  Result Value Ref Range   Free T4 1.03 0.82 - 1.77 ng/dL  T3, Free     Status: None   Collection Time: 01/16/22  3:49 PM  Result Value Ref Range   T3, Free 3.4 2.0 - 4.4 pg/mL       Thyroid uptake and scan on February 03, 2020 FINDINGS: Multinodular appearing thyroid lobes.   Dominant warm nodules at mid inferior RIGHT thyroid lobe with additional smaller warm nodules at the superior thyroid lobes bilaterally.   Small cold nodule seen mid to upper RIGHT lobe and mid LEFT lobe.   4 hour I-123 uptake = 13.0% (normal 5-20%),   24 hour I-123 uptake = 27.2% (normal 10-30%)   IMPRESSION: Multinodular thyroid gland.   Normal 4 hour and 24 hour radio iodine uptakes.    Thyroid ultrasound April 26, 2020 IMPRESSION: 1. Multinodular goiter. 2. Solid nodule in the mid right thyroid measuring up to 1.2 cm (labeled 3) highly suspicious for malignancy and meets criteria for tissue sampling (TI-RADS category 5). This nodule may correspond to the right mid to superior cold nodule described on recent nuclear medicine study. Recommend  ultrasound-guided fine-needle aspiration. 3. Nodules labeled 2 (right superior), 5 (left superior), and 6 (left mid) meet criteria for annual ultrasound surveillance.    Fine-needle aspiration of a thyroid nodule on May 03, 2020 FINAL MICROSCOPIC DIAGNOSIS:  - Scant follicular epithelium present ([facility] category I)   SPECIMEN ADEQUACY:  Satisfactory but limited for evaluation, scant cellularity   GROSS:  Received is/are 3 slides in 95% Ethyl alcohol, 3 air dried slides for  diff stain, and 30 ccs of clear colorless Cytolyt solution. (CM:cm)   Prepared:  Smears:  6  Concentration Method (Thin Prep):  1  Cell Block:  Cell block attempted, not obtained.    Her most recent thyroid ultrasound on January 18, 2021 IMPRESSION: 1. No significant interval change in the size or appearance of the previously biopsied nodule in the right mid gland. Recommend correlation with prior biopsy results. 2. Stable 1 cm TI-RADS category 4 nodule in the left upper gland (labeled #5) which continues to meet criteria for imaging surveillance. Recommend follow-up ultrasound in 1 year. 3. Interval involution of nodules in the right upper and left lower gland which no longer meet criteria for continued surveillance. 4. No new nodules or suspicious findings.    Assessment & Plan:   1. Subclinical hyperthyroidism  2. MNG Her biopsy results are benign. Her previsit thyroid function tests are consistent with subclinical hypothyroidism.  She is advised to resume and continue methimazole 2.5 mg p.o. daily at breakfast.     Her prior 24-hour thyroid uptake was 27.2% with areas of increased uptake consistent with multinodular goiter.   She opted for antithyroid medications instead of ablative treatment.  She will have repeat thyroid function test before her next visit in 4 months.  She will have repeat thyroid ultrasound after her next visit.   If her nodular features change, she may need repeat fine-needle aspiration. If her response to methimazole is not adequate, she will be considered for I-131 thyroid ablation in the future.   Side effects and precautions of methimazole were discussed with her.    -Patient is advised to maintain close follow up with Denyce Robert, FNP for primary care needs.   I spent 25 minutes in the care of the patient today including review of labs from Thyroid Function, CMP, and other relevant labs ; imaging/biopsy records (current and previous including abstractions from other facilities); face-to-face time  discussing  her lab results and symptoms, medications doses, her options of short and long term treatment based on the latest standards of care / guidelines;   and documenting the encounter.  Dereona Shields-Lanier  participated in the discussions, expressed understanding, and voiced agreement with the above plans.  All questions were answered to her satisfaction. she is encouraged to contact clinic should she have any questions or concerns prior to her return visit.   Follow up plan: Return in about 2 weeks (around 05/15/2020) for F/U with Biopsy Results.   Thank you for involving me in the care of this pleasant patient, and I will continue to update you with her progress.  Glade Lloyd, MD [facility] Phone: [phone]  Fax: [phone]   05/01/2020, 12:00 PM  This note was partially dictated with voice recognition software. Similar sounding words can be transcribed inadequately or may not  be corrected upon review.

## 2022-01-23 NOTE — Progress Notes (Unsigned)
[place], [place] [address]   CLINIC:  Medical Oncology/Hematology  PCP:  Denyce Robert, [place] [address] [phone]   REASON FOR VISIT:  Follow-up for iron deficiency anemia  CURRENT THERAPY: Intermittent IV iron infusions  INTERVAL HISTORY:  Charlotte Mccormick 36 y.o. female returns for routine follow-up of her iron deficiency anemia.  She was last evaluated via telemedicine visit by Tarri Abernethy PA-C on 09/26/2021.  At today's visit, she reports feeling fatigued.  She reports that she had been doing better after starting Myfembree, but she was unable to obtain her medication over the past several months due to financial constraints, which resulted in recurrent menorrhagia.  Since that time, she has had increasing fatigue and dizzy spells as well as recurrent night sweats.  She denies any other source of blood loss such as melena, hematochezia, or epistaxis.  No pica, restless legs, chest pain, dyspnea.  No fevers, chills, or unintentional weight loss.  She reports 60% energy and 90% appetite.  She is maintaining a stable weight at this time.   REVIEW OF SYSTEMS:    Review of Systems  Constitutional:  Positive for fatigue. Negative for appetite change, chills, diaphoresis, fever and unexpected weight change.  HENT:   Negative for lump/mass and nosebleeds.   Eyes:  Negative for eye problems.  Respiratory:  Negative for cough, hemoptysis and shortness of breath.   Cardiovascular:  Negative for chest pain, leg swelling and palpitations.  Gastrointestinal:  Negative for abdominal pain, blood in stool, constipation, diarrhea, nausea and vomiting.  Endocrine: Positive for hot flashes (with night sweats).  Genitourinary:  Negative for hematuria.   Skin: Negative.   Neurological:  Positive for dizziness. Negative for headaches and light-headedness.  Hematological:  Does not bruise/bleed easily.   Psychiatric/Behavioral:  Positive for depression and sleep disturbance. The patient is nervous/anxious.       PAST MEDICAL/SURGICAL HISTORY:  Past Medical History:  Diagnosis Date   Abnormal uterine bleeding (AUB) 12/12/2015   Anemia    Anxiety    Asthma    Constipation    Depression    Fibroid    Genital herpes    GERD (gastroesophageal reflux disease)    History of chlamydia    Hyperemesis    IBS (irritable bowel syndrome)    Insomnia    Mental disorder    PTSD, ANXIETY,Depression   PTSD (post-traumatic stress disorder)    Sinusitis    Thyroid disease    Vaginal dryness 12/12/2015   Past Surgical History:  Procedure Laterality Date   COLONOSCOPY     2016   COLONOSCOPY N/A 04/08/2016   Procedure: COLONOSCOPY;  Surgeon: Danie Binder, MD;  [facility];  Service: Endoscopy;  Laterality: N/A;  10:15 AM   COLONOSCOPY WITH PROPOFOL N/A 07/24/2020   Procedure: COLONOSCOPY WITH PROPOFOL;  Surgeon: [name], DO;  [facility]: [facility];  Service: Endoscopy;  Laterality: N/A;  11:00am   ESOPHAGOGASTRODUODENOSCOPY N/A 04/08/2016   Procedure: ESOPHAGOGASTRODUODENOSCOPY (EGD);  Surgeon: Danie Binder, MD;  [facility];  Service: Endoscopy;  Laterality: N/A;   ESOPHAGOGASTRODUODENOSCOPY (EGD) WITH PROPOFOL N/A 01/17/2020   Procedure: ESOPHAGOGASTRODUODENOSCOPY (EGD) WITH PROPOFOL;  Surgeon: [name], DO;  [facility];  Service: Endoscopy;  Laterality: N/A;  8:15am   GIVENS CAPSULE STUDY N/A 09/28/2020   Procedure: GIVENS CAPSULE STUDY;  Surgeon: [name], DO;  [facility];  Service: Endoscopy;  Laterality: N/A;  7:30am   PERINEUM REPAIR     UPPER GASTROINTESTINAL ENDOSCOPY  2016     SOCIAL HISTORY:  Social History   Socioeconomic History   Marital status: Married    Spouse name: Not on file   Number of children: 3   Years of education: Not on file   Highest education level: Not on file  Occupational  History   Not on file  Tobacco Use   Smoking status: Former    Packs/day: 0.50    Years: 5.00    Total pack years: 2.50    Types: Cigarettes    Quit date: 06/03/2010    Years since quitting: 11.6   Smokeless tobacco: Never  Vaping Use   Vaping Use: Never used  Substance and Sexual Activity   Alcohol use: Not Currently    Alcohol/week: 2.0 standard drinks of alcohol    Types: 2 Glasses of wine per week    Comment: occ wine; denied 11/02/19   Drug use: Not Currently    Types: Marijuana    Comment: 11/02/19 states she used gummies 1-2 times a month.   Sexual activity: Yes    Birth control/protection: Surgical    Comment: vasectomy  Other Topics Concern   Not on file  Social History Narrative   ** Merged History Encounter **       Social Determinants of Health   Financial Resource Strain: Not on file  Food Insecurity: Not on file  Transportation Needs: Not on file  Physical Activity: Not on file  Stress: Not on file  Social Connections: Not on file  Intimate Partner Violence: Not on file    FAMILY HISTORY:  Family History  Problem Relation Age of Onset   Heart disease Mother    Hypertension Mother    Hyperlipidemia Mother    Heart attack Mother    Heart failure Mother    Hypertension Father    Hyperlipidemia Father    Diabetes Father    Cancer Father    Thyroid disease Father    Stroke Father    Epilepsy Brother    Eczema Daughter    Autism spectrum disorder Son    Colon cancer Neg Hx     CURRENT MEDICATIONS:  Outpatient Encounter Medications as of 01/24/2022  Medication Sig   acetaminophen (TYLENOL) 500 MG tablet Take 500 mg by mouth every 6 (six) hours as needed for mild pain or moderate pain.   albuterol (VENTOLIN HFA) 108 (90 Base) MCG/ACT inhaler Inhale 1 puff into the lungs every 6 (six) hours as needed for wheezing or shortness of breath.   buPROPion (WELLBUTRIN XL) 300 MG 24 hr tablet Take 1 tablet (300 mg total) by mouth daily.   dexlansoprazole  (DEXILANT) 60 MG capsule Take 1 capsule (60 mg total) by mouth daily. (Patient not taking: Reported on 01/22/2022)   escitalopram (LEXAPRO) 10 MG tablet Take 1 tablet (10 mg total) by mouth daily.   fluticasone (FLONASE) 50 MCG/ACT nasal spray SMARTSIG:1-2 Spray(s) Both Nares Daily   meloxicam (MOBIC) 15 MG tablet Take 15 mg by mouth daily as needed for pain.   methimazole (TAPAZOLE) 5 MG tablet Take 0.5 tablets (2.5 mg total) by mouth daily.   montelukast (SINGULAIR) 10 MG tablet Take 10 mg by mouth daily.   MYFEMBREE 40-1-0.5 MG TABS Take 1 tablet by mouth once daily   ondansetron (ZOFRAN-ODT) 4 MG disintegrating tablet Take 1 tablet (4 mg total) by mouth every 8 (  eight) hours as needed for nausea or vomiting.   prazosin (MINIPRESS) 5 MG capsule Take 1 capsule (5 mg total) by mouth at bedtime.   rizatriptan (MAXALT) 10 MG tablet Take 1 tablet by mouth daily as needed. At onset of headache   Vitamin D, Ergocalciferol, (DRISDOL) 1.25 MG (50000 UNIT) CAPS capsule Take 1 capsule (50,000 Units total) by mouth every 7 (seven) days.   [DISCONTINUED] LO LOESTRIN FE 1 MG-10 MCG / 10 MCG tablet Take 1 tablet by mouth daily. (Patient not taking: Reported on 10/13/2019)   [DISCONTINUED] zolpidem (AMBIEN CR) 12.5 MG CR tablet Take 1 tablet (12.5 mg total) by mouth at bedtime as needed for sleep. (Patient not taking: No sig reported)   No facility-administered encounter medications on file as of 01/24/2022.    ALLERGIES:  Allergies  Allergen Reactions   Antihistamines, Diphenhydramine-Type Anxiety    Patient reports she cannot tolerate diphenhydramine with symptoms of anxiety   Bee Venom Swelling and Other (See Comments)    Reaction:  Localized swelling    Hydrocodone Itching   Penicillins Itching and Other (See Comments)    Has patient had a PCN reaction causing immediate rash, facial/tongue/throat swelling, SOB or lightheadedness with hypotension: No Has patient had a PCN reaction causing severe rash  involving mucus membranes or skin necrosis: No Has patient had a PCN reaction that required hospitalization No Has patient had a PCN reaction occurring within the last 10 years: Yes If all of the above answers are "NO", then may proceed with Cephalosporin use.    Venofer [Iron Sucrose] Nausea And Vomiting and Other (See Comments)    Diaphoresis and vomiting   Reglan [Metoclopramide] Anxiety     PHYSICAL EXAM:    ECOG PERFORMANCE STATUS: 1 - Symptomatic but completely ambulatory  There were no vitals filed for this visit. There were no vitals filed for this visit. Physical Exam Constitutional:      Appearance: Normal appearance. She is obese.  HENT:     Head: Normocephalic and atraumatic.     Mouth/Throat:     Mouth: Mucous membranes are moist.  Eyes:     Extraocular Movements: Extraocular movements intact.     Pupils: Pupils are equal, round, and reactive to light.  Cardiovascular:     Rate and Rhythm: Normal rate and regular rhythm.     Pulses: Normal pulses.     Heart sounds: Normal heart sounds.  Pulmonary:     Effort: Pulmonary effort is normal.     Breath sounds: Normal breath sounds.  Abdominal:     General: Bowel sounds are normal.     Palpations: Abdomen is soft.     Tenderness: There is no abdominal tenderness.  Musculoskeletal:        General: No swelling.     Right lower leg: No edema.     Left lower leg: No edema.  Lymphadenopathy:     Cervical: No cervical adenopathy.  Skin:    General: Skin is warm and dry.  Neurological:     General: No focal deficit present.     Mental Status: She is alert and oriented to person, place, and time.  Psychiatric:        Mood and Affect: Mood normal.        Behavior: Behavior normal.      LABORATORY DATA:  I have reviewed the labs as listed.  CBC    Component Value Date/Time   WBC 6.7 08/27/2021 0458   RBC 5.21 (H)  08/27/2021 0458   HGB 14.5 08/27/2021 0458   HGB 14.4 05/14/2021 1536   HCT 43.1 08/27/2021  0458   HCT 43.5 05/14/2021 1536   PLT 211 08/27/2021 0458   PLT 271 05/14/2021 1536   MCV 82.7 08/27/2021 0458   MCV 86 05/14/2021 1536   MCH 27.8 08/27/2021 0458   MCHC 33.6 08/27/2021 0458   RDW 12.0 08/27/2021 0458   RDW 12.1 05/14/2021 1536   LYMPHSABS 0.6 (L) 08/27/2021 0458   LYMPHSABS 1.4 05/14/2021 1536   MONOABS 0.7 08/27/2021 0458   EOSABS 0.1 08/27/2021 0458   EOSABS 0.1 05/14/2021 1536   BASOSABS 0.0 08/27/2021 0458   BASOSABS 0.0 05/14/2021 1536      Latest Ref Rng & Units 08/27/2021    4:58 AM 10/15/2019   12:00 AM 10/13/2019   12:12 PM  CMP  Glucose 70 - 99 mg/dL 125   90   BUN 6 - 20 mg/dL '10  7     9   '$ Creatinine 0.44 - 1.00 mg/dL 0.69  0.7     0.69   Sodium 135 - 145 mmol/L 137   135   Potassium 3.5 - 5.1 mmol/L 3.4   4.2   Chloride 98 - 111 mmol/L 106   105   CO2 22 - 32 mmol/L 23   23   Calcium 8.9 - 10.3 mg/dL 9.0   8.9   Total Protein 6.5 - 8.1 g/dL 8.0   7.7   Total Bilirubin 0.3 - 1.2 mg/dL 0.6   0.4   Alkaline Phos 38 - 126 U/L 152   144   AST 15 - 41 U/L 61   17   ALT 0 - 44 U/L 70   24      This result is from an external source.    DIAGNOSTIC IMAGING:  I have independently reviewed the relevant imaging and discussed with the patient.  ASSESSMENT & PLAN: 1.  Microcytic anemia with iron deficiency - Longstanding history of anemia, most likely secondary to menorrhagia  - EGD was 01/17/2020 which showed gastritis without clear signs of bleeding - Colonoscopy on 07/24/2020 showed nonbleeding internal hemorrhoids and diverticulosis, but was negative for signs of bleeding. - Capsule study in April 2022 was unremarkable - Other causes for anemia were investigated, but unremarkable (normal SPEP, haptoglobin, B12, folate, copper, LDH) - Failed oral iron therapy due to severe constipation and lack of improvement - Denies any GI blood loss within the past 6 months - Periods were better when taking Myfembree, but she has had difficulty obtaining  medication.  Recurrent menorrhagia when not taking Myfembree. - Moderate fatigue with energy about 60%   - Most recent IV iron with Feraheme on 10/16/2021.  Patient had REACTION TO VENOFER on 10/24/2020, with nausea, vomiting, diaphoresis.   - Most recent labs (obtained via [facility] on 01/16/2022): Hgb 14.5/MCV 84, ferritin 65, iron saturation 17% - PLAN: Recommend IV Feraheme x2 due to symptomatic iron deficiency with recurrent menorrhagia.  However, patient is unable to obtain IV iron due to financial and time constraints.  She would like to wait and see how her labs look at her follow-up visit. - We will repeat labs and office visit in 3 months   2.  Vitamin D deficiency - She has not been taking her vitamin D supplementation.  (Recommended vitamin D 2000 units OTC at last visit)  - Most recent vitamin D (01/17/2022): Low at 23.7 - Switch to vitamin D 50,000 units weekly  by endocrinologist on 01/22/2022. - PLAN: Continue vitamin D dosing and monitoring per endocrinologist.   3.  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  No further work-up or treatment indicated.   4. Social and family history - Patient's father has cancer of unknown type.  He is also anemic, has received multiple blood transfusions over the past year. - Patient is a lifelong non-smoker.  Does not use drugs or alcohol    PLAN SUMMARY & DISPOSITION: Labs at [facility] in 3 months followed by office visit  All questions were answered. The patient knows to call the clinic with any problems, questions or concerns.  Medical decision making: Low  Time spent on visit: I spent 15 minutes counseling the patient face to face. The total time spent in the appointment was 25 minutes and more than 50% was on  counseling.   Harriett Rush, PA-C  01/24/2022 6:05 PM

## 2022-01-24 ENCOUNTER — Inpatient Hospital Stay: Payer: Medicare HMO | Attending: Physician Assistant | Admitting: Physician Assistant

## 2022-01-24 VITALS — BP 128/90 | HR 84 | Temp 98.0°F | Resp 18 | Ht 62.0 in | Wt 181.2 lb

## 2022-01-24 DIAGNOSIS — D509 Iron deficiency anemia, unspecified: Secondary | ICD-10-CM | POA: Diagnosis not present

## 2022-01-24 DIAGNOSIS — R61 Generalized hyperhidrosis: Secondary | ICD-10-CM | POA: Insufficient documentation

## 2022-01-24 DIAGNOSIS — N920 Excessive and frequent menstruation with regular cycle: Secondary | ICD-10-CM | POA: Diagnosis present

## 2022-01-24 DIAGNOSIS — N951 Menopausal and female climacteric states: Secondary | ICD-10-CM | POA: Insufficient documentation

## 2022-01-24 DIAGNOSIS — E559 Vitamin D deficiency, unspecified: Secondary | ICD-10-CM | POA: Insufficient documentation

## 2022-01-24 DIAGNOSIS — D709 Neutropenia, unspecified: Secondary | ICD-10-CM | POA: Diagnosis not present

## 2022-01-24 DIAGNOSIS — D500 Iron deficiency anemia secondary to blood loss (chronic): Secondary | ICD-10-CM | POA: Diagnosis not present

## 2022-01-24 DIAGNOSIS — Z87891 Personal history of nicotine dependence: Secondary | ICD-10-CM | POA: Insufficient documentation

## 2022-01-24 NOTE — Patient Instructions (Signed)
[place] at [place] **   You were seen today by Tarri Abernethy PA-C for your iron deficiency anemia and B12 deficiency.    IRON DEFICIENCY ANEMIA: Your blood levels look great, but your iron level is slightly lower than usual.  This is likely due to your heavy periods over the past few months. Your symptoms of fatigue may improve with IV iron, but I understand that this is difficult to accommodate right now. If you decide that you would like to have IV iron and work that into your schedule, please let our office know and we can schedule you!  VITAMIN D DEFICIENCY You can STOP taking your low-dose vitamin D 2000 units. Continue taking vitamin D 50,000 units (1.25 mcg vitamin D2) once a week, as prescribed by endocrinologist.  LABS: Return in 3 months for repeat labs (at [facility])  [place]: Office visit in 3 months, after labs  ** Thank you for trusting me with your healthcare!  I strive to provide all of my patients with quality care at each visit.  If you receive a survey for this visit, I would be so grateful to you for taking the time to provide feedback.  Thank you in advance!  ~ Haygen Zebrowski                   Dr. Derek Jack   &   Tarri Abernethy, PA-C   - - - - - - - - - - - - - - - - - -    Thank you for choosing [place] at [facility] to provide your oncology and hematology care.  To afford each patient quality time with our provider, please arrive at least 15 minutes before your scheduled appointment time.   If you have a lab appointment with the [facility] please come in thru the Main Entrance and check in at the main information desk.  You need to re-schedule your appointment should you arrive 10 or more minutes late.  We strive to give you quality time with our providers, and arriving late affects you and other patients whose appointments are after yours.   Also, if you no show three or more times for appointments you may be dismissed from the clinic at the providers discretion.     Again, thank you for choosing [facility].  Our hope is that these requests will decrease the amount of time that you wait before being seen by our physicians.       _____________________________________________________________  Should you have questions after your visit to [facility], please contact our office at [phone] and follow the prompts.  Our office hours are 8:00 a.m. and 4:30 p.m. Monday - Friday.  Please note that voicemails left after 4:00 p.m. may not be returned until the following business day.  We are closed weekends and major holidays.  You do have access to a nurse 24-7, just call the main number to the clinic [phone] and do not press any options, hold on the line and a nurse will answer the phone.    For prescription refill requests, have your pharmacy contact our office and allow 72 hours.

## 2022-02-13 ENCOUNTER — Encounter (HOSPITAL_COMMUNITY): Payer: Self-pay | Admitting: *Deleted

## 2022-02-13 ENCOUNTER — Other Ambulatory Visit: Payer: Self-pay

## 2022-02-13 DIAGNOSIS — E876 Hypokalemia: Secondary | ICD-10-CM | POA: Diagnosis not present

## 2022-02-13 DIAGNOSIS — R531 Weakness: Secondary | ICD-10-CM | POA: Diagnosis present

## 2022-02-13 NOTE — ED Triage Notes (Signed)
Pt with c/o generalized weakness and dizziness while at [employer].  Denies N/V or pain

## 2022-02-14 ENCOUNTER — Emergency Department (HOSPITAL_COMMUNITY)
Admission: EM | Admit: 2022-02-14 | Discharge: 2022-02-14 | Disposition: A | Discharge status: Home / Self Care | Payer: Medicare HMO | Attending: Emergency Medicine | Admitting: Emergency Medicine

## 2022-02-14 ENCOUNTER — Other Ambulatory Visit: Payer: Self-pay | Admitting: Physician Assistant

## 2022-02-14 DIAGNOSIS — R531 Weakness: Secondary | ICD-10-CM

## 2022-02-14 DIAGNOSIS — E876 Hypokalemia: Secondary | ICD-10-CM

## 2022-02-14 LAB — CBC WITH DIFFERENTIAL/PLATELET
Abs Immature Granulocytes: 0.01 10*3/uL (ref 0.00–0.07)
Basophils Absolute: 0 10*3/uL (ref 0.0–0.1)
Basophils Relative: 1 %
Eosinophils Absolute: 0.1 10*3/uL (ref 0.0–0.5)
Eosinophils Relative: 1 %
HCT: 43.9 % (ref 36.0–46.0)
Hemoglobin: 14.9 g/dL (ref 12.0–15.0)
Immature Granulocytes: 0 %
Lymphocytes Relative: 23 %
Lymphs Abs: 1.2 10*3/uL (ref 0.7–4.0)
MCH: 29.2 pg (ref 26.0–34.0)
MCHC: 33.9 g/dL (ref 30.0–36.0)
MCV: 85.9 fL (ref 80.0–100.0)
Monocytes Absolute: 0.4 10*3/uL (ref 0.1–1.0)
Monocytes Relative: 8 %
Neutro Abs: 3.6 10*3/uL (ref 1.7–7.7)
Neutrophils Relative %: 67 %
Platelets: 237 10*3/uL (ref 150–400)
RBC: 5.11 MIL/uL (ref 3.87–5.11)
RDW: 12.2 % (ref 11.5–15.5)
WBC: 5.3 10*3/uL (ref 4.0–10.5)
nRBC: 0 % (ref 0.0–0.2)

## 2022-02-14 LAB — TYPE AND SCREEN
ABO/RH(D): B POS
Antibody Screen: NEGATIVE

## 2022-02-14 LAB — BASIC METABOLIC PANEL
Anion gap: 9 (ref 5–15)
BUN: 11 mg/dL (ref 6–20)
CO2: 22 mmol/L (ref 22–32)
Calcium: 9.4 mg/dL (ref 8.9–10.3)
Chloride: 109 mmol/L (ref 98–111)
Creatinine, Ser: 0.69 mg/dL (ref 0.44–1.00)
GFR, Estimated: >60 mL/min (ref >60)
Glucose, Bld: 99 mg/dL (ref 70–99)
Potassium: 3.3 mmol/L — ABNORMAL LOW (ref 3.5–5.1)
Sodium: 140 mmol/L (ref 135–145)

## 2022-02-14 LAB — CK: Total CK: 123 U/L (ref 38–234)

## 2022-02-14 LAB — MAGNESIUM: Magnesium: 1.9 mg/dL (ref 1.7–2.4)

## 2022-02-14 MED ORDER — LACTATED RINGERS IV BOLUS: 1000.0000 mL | Freq: Once | INTRAVENOUS | Status: DC

## 2022-02-14 MED ORDER — POTASSIUM CHLORIDE CRYS ER 20 MEQ PO TBCR: 20.0000 meq | EXTENDED_RELEASE_TABLET | Freq: Every day | ORAL | 0 refills | Status: DC

## 2022-02-14 MED ORDER — POTASSIUM CHLORIDE CRYS ER 20 MEQ PO TBCR
40.0000 meq | EXTENDED_RELEASE_TABLET | Freq: Once | ORAL | Status: AC
  Administered 2022-02-14: 40 meq via ORAL
  Filled 2022-02-14: qty 2

## 2022-02-14 NOTE — ED Notes (Signed)
Pt refused IV til the MD can tell her why she needs an IV

## 2022-02-14 NOTE — ED Provider Notes (Signed)
[facility] EMERGENCY DEPARTMENT Provider Note   CSN: [id number] Arrival date & time: 02/13/22  2147     History  Chief Complaint  Patient presents with   Weakness    Charlotte Mccormick is a 36 y.o. female.  Patient presents to the emergency department with inability to move.  She has been feeling well during the day.  While at church tonight she started to feel weak and now cannot move any of her extremities or sit up on her own.       Home Medications Prior to Admission medications   Medication Sig Start Date End Date Taking? Authorizing Provider  potassium chloride SA (KLOR-CON M) 20 MEQ tablet Take 1 tablet (20 mEq total) by mouth daily. 02/14/22  Yes Zayde Stroupe, Gwenyth Allegra, MD  acetaminophen (TYLENOL) 500 MG tablet Take 500 mg by mouth every 6 (six) hours as needed for mild pain or moderate pain.    [provider]  albuterol (VENTOLIN HFA) 108 (90 Base) MCG/ACT inhaler Inhale 1 puff into the lungs every 6 (six) hours as needed for wheezing or shortness of breath.    [provider]  buPROPion (WELLBUTRIN XL) 300 MG 24 hr tablet Take 1 tablet (300 mg total) by mouth daily. 09/11/20 01/09/21  Pucilowski, Marchia Bond, MD  dexlansoprazole (DEXILANT) 60 MG capsule Take 1 capsule (60 mg total) by mouth daily. 11/08/19   Annitta Needs, NP  escitalopram (LEXAPRO) 10 MG tablet Take 1 tablet (10 mg total) by mouth daily. 10/16/20 01/14/21  Pucilowski, Marchia Bond, MD  fluticasone (FLONASE) 50 MCG/ACT nasal spray SMARTSIG:1-2 Spray(s) Both Nares Daily 09/16/21   [provider]  hydrOXYzine (ATARAX) 25 MG tablet Take 25 mg by mouth every 8 (eight) hours as needed. 12/18/21   [provider]  meloxicam (MOBIC) 15 MG tablet Take 15 mg by mouth daily as needed for pain. 01/21/19   [provider]  methimazole (TAPAZOLE) 5 MG tablet Take 0.5 tablets (2.5 mg total) by mouth daily. 01/22/22   Cassandria Anger, MD  montelukast (SINGULAIR) 10 MG tablet Take  10 mg by mouth daily. 07/24/21   [provider]  MYFEMBREE 40-1-0.5 MG TABS Take 1 tablet by mouth once daily 01/16/22   Florian Buff, MD  ondansetron (ZOFRAN-ODT) 4 MG disintegrating tablet Take 1 tablet (4 mg total) by mouth every 8 (eight) hours as needed for nausea or vomiting. 08/27/21   Horton, Barbette Hair, MD  prazosin (MINIPRESS) 5 MG capsule Take 1 capsule (5 mg total) by mouth at bedtime. 09/11/20 01/09/21  Pucilowski, Marchia Bond, MD  rizatriptan (MAXALT) 10 MG tablet Take 1 tablet by mouth daily as needed. At onset of headache 08/17/19   [provider]  Vitamin D, Ergocalciferol, (DRISDOL) 1.25 MG (50000 UNIT) CAPS capsule Take 1 capsule (50,000 Units total) by mouth every 7 (seven) days. 01/22/22   Cassandria Anger, MD  LO LOESTRIN FE 1 MG-10 MCG / 10 MCG tablet Take 1 tablet by mouth daily. Patient not taking: Reported on 10/13/2019 02/15/19 10/29/19  [name], CNM  zolpidem (AMBIEN CR) 12.5 MG CR tablet Take 1 tablet (12.5 mg total) by mouth at bedtime as needed for sleep. Patient not taking: No sig reported 09/20/19 10/29/19  Pucilowski, Marchia Bond, MD      Allergies    Antihistamines, diphenhydramine-type; Bee venom; Hydrocodone; Penicillins; Venofer [iron sucrose]; and Reglan [metoclopramide]    Review of Systems   Review of Systems  Physical Exam Updated Vital Signs  BP 134/85 (BP Location: Right Arm)   Pulse 95   Temp 98.4 F (36.9 C) (Oral)   Resp 16   Ht '5\' 2"'$  (1.575 m)   Wt 81.6 kg   LMP 02/09/2022   SpO2 96%   BMI 32.92 kg/m  Physical Exam Vitals and nursing note reviewed.  Constitutional:      General: She is not in acute distress.    Appearance: She is well-developed.  HENT:     Head: Normocephalic and atraumatic.     Mouth/Throat:     Mouth: Mucous membranes are moist.  Eyes:     General: Vision grossly intact. Gaze aligned appropriately.     Extraocular Movements: Extraocular movements intact.     Conjunctiva/sclera:  Conjunctivae normal.  Cardiovascular:     Rate and Rhythm: Normal rate and regular rhythm.     Pulses: Normal pulses.     Heart sounds: Normal heart sounds, S1 normal and S2 normal. No murmur heard.    No friction rub. No gallop.  Pulmonary:     Effort: Pulmonary effort is normal. No respiratory distress.     Breath sounds: Normal breath sounds.  Abdominal:     General: Bowel sounds are normal.     Palpations: Abdomen is soft.     Tenderness: There is no abdominal tenderness. There is no guarding or rebound.     Hernia: No hernia is present.  Musculoskeletal:        General: No swelling.     Cervical back: Full passive range of motion without pain, normal range of motion and neck supple. No spinous process tenderness or muscular tenderness. Normal range of motion.     Right lower leg: No edema.     Left lower leg: No edema.  Skin:    General: Skin is warm and dry.     Capillary Refill: Capillary refill takes less than 2 seconds.     Findings: No ecchymosis, erythema, rash or wound.  Neurological:     Mental Status: She is alert and oriented to person, place, and time.     GCS: GCS eye subscore is 4. GCS verbal subscore is 5. GCS motor subscore is 6.     Cranial Nerves: Cranial nerves 2-12 are intact.     Sensory: Sensation is intact.     Deep Tendon Reflexes: Reflexes are normal and symmetric.     Reflex Scores:      Bicep reflexes are 2+ on the right side and 2+ on the left side.      Patellar reflexes are 2+ on the right side and 2+ on the left side.    Comments: Inconsistent exam, patient resists my attempts at passive movement of her extremities  Psychiatric:        Attention and Perception: Attention normal.        Mood and Affect: Mood normal.        Speech: Speech normal.        Behavior: Behavior normal.     ED Results / Procedures / Treatments   Labs (all labs ordered are listed, but only abnormal results are displayed) Labs Reviewed  BASIC METABOLIC PANEL -  Abnormal; Notable for the following components:      Result Value   Potassium 3.3 (*)    All other components within normal limits  CBC WITH DIFFERENTIAL/PLATELET  CK  MAGNESIUM  URINALYSIS, ROUTINE W REFLEX MICROSCOPIC  RAPID URINE DRUG SCREEN, HOSP PERFORMED  TYPE AND SCREEN  EKG EKG Interpretation  Date/Time:  Wednesday February 13 2022 22:05:41 EDT Ventricular Rate:  82 PR Interval:  154 QRS Duration: 84 QT Interval:  350 QTC Calculation: 408 R Axis:   71 Text Interpretation: Normal sinus rhythm Normal ECG When compared with ECG of 04-Dec-2016 02:40, PREVIOUS ECG IS PRESENT Confirmed by Orpah Greek [id number]) on 02/14/2022 12:44:38 AM  Radiology No results found.  Procedures Procedures    Medications Ordered in ED Medications  lactated ringers bolus 1,000 mL (1,000 mLs Intravenous Patient Refused/Not Given 02/14/22 0114)  potassium chloride SA (KLOR-CON M) CR tablet 40 mEq (has no administration in time range)    ED Course/ Medical Decision Making/ A&P                           Medical Decision Making Amount and/or Complexity of Data Reviewed Labs: ordered.  Risk Prescription drug management.   Patient presents with sudden onset generalized weakness.  Patient reports inability to move any of her extremities.  She has preserved sensation throughout.  No symptoms earlier today.  While here in the emergency department she has slowly improved.  She is now ambulatory in her room with minimal assistance from her husband.  Patient states that she may have had a stress response.  She does not want to stay for any further treatment.  Discussed with her oral potassium, daily potassium, primary care follow-up.        Final Clinical Impression(s) / ED Diagnoses Final diagnoses:  Weakness  Hypokalemia    Rx / DC Orders ED Discharge Orders          Ordered    potassium chloride SA (KLOR-CON M) 20 MEQ tablet  Daily        02/14/22 0114               Orpah Greek, MD 02/14/22 [phone]

## 2022-02-19 ENCOUNTER — Inpatient Hospital Stay: Payer: Medicare HMO | Attending: Physician Assistant

## 2022-02-19 VITALS — BP 109/74 | HR 77 | Temp 98.3°F | Resp 16

## 2022-02-19 DIAGNOSIS — N920 Excessive and frequent menstruation with regular cycle: Secondary | ICD-10-CM | POA: Diagnosis present

## 2022-02-19 DIAGNOSIS — D509 Iron deficiency anemia, unspecified: Secondary | ICD-10-CM | POA: Diagnosis present

## 2022-02-19 DIAGNOSIS — D500 Iron deficiency anemia secondary to blood loss (chronic): Secondary | ICD-10-CM

## 2022-02-19 MED ORDER — SODIUM CHLORIDE 0.9 % IV SOLN: Freq: Once | INTRAVENOUS | Status: AC

## 2022-02-19 MED ORDER — FAMOTIDINE IN NACL 20-0.9 MG/50ML-% IV SOLN
20.0000 mg | Freq: Once | INTRAVENOUS | Status: AC
  Administered 2022-02-19: 20 mg via INTRAVENOUS
  Filled 2022-02-19: qty 50

## 2022-02-19 MED ORDER — METHYLPREDNISOLONE SODIUM SUCC 125 MG IJ SOLR
125.0000 mg | Freq: Once | INTRAMUSCULAR | Status: AC
  Administered 2022-02-19: 125 mg via INTRAVENOUS
  Filled 2022-02-19: qty 2

## 2022-02-19 MED ORDER — SODIUM CHLORIDE 0.9 % IV SOLN
510.0000 mg | Freq: Once | INTRAVENOUS | Status: AC
  Administered 2022-02-19: 510 mg via INTRAVENOUS
  Filled 2022-02-19: qty 510

## 2022-02-19 MED ORDER — ACETAMINOPHEN 325 MG PO TABS
650.0000 mg | ORAL_TABLET | Freq: Once | ORAL | Status: AC
  Administered 2022-02-19: 650 mg via ORAL
  Filled 2022-02-19: qty 2

## 2022-02-19 MED ORDER — LORATADINE 10 MG PO TABS
10.0000 mg | ORAL_TABLET | Freq: Once | ORAL | Status: AC
  Administered 2022-02-19: 10 mg via ORAL
  Filled 2022-02-19: qty 1

## 2022-02-27 ENCOUNTER — Inpatient Hospital Stay: Payer: Medicare HMO

## 2022-02-27 ENCOUNTER — Other Ambulatory Visit: Payer: Self-pay | Admitting: Physician Assistant

## 2022-02-27 VITALS — BP 117/77 | HR 80 | Temp 99.3°F | Resp 16

## 2022-02-27 DIAGNOSIS — D500 Iron deficiency anemia secondary to blood loss (chronic): Secondary | ICD-10-CM

## 2022-02-27 DIAGNOSIS — R110 Nausea: Secondary | ICD-10-CM

## 2022-02-27 DIAGNOSIS — D509 Iron deficiency anemia, unspecified: Secondary | ICD-10-CM | POA: Diagnosis not present

## 2022-02-27 MED ORDER — SODIUM CHLORIDE 0.9 % IV SOLN
510.0000 mg | Freq: Once | INTRAVENOUS | Status: AC
  Administered 2022-02-27: 510 mg via INTRAVENOUS
  Filled 2022-02-27: qty 510

## 2022-02-27 MED ORDER — SODIUM CHLORIDE 0.9 % IV SOLN: Freq: Once | INTRAVENOUS | Status: AC

## 2022-02-27 MED ORDER — METHYLPREDNISOLONE SODIUM SUCC 125 MG IJ SOLR
125.0000 mg | Freq: Once | INTRAMUSCULAR | Status: AC
  Administered 2022-02-27: 125 mg via INTRAVENOUS
  Filled 2022-02-27: qty 2

## 2022-02-27 MED ORDER — FAMOTIDINE IN NACL 20-0.9 MG/50ML-% IV SOLN
20.0000 mg | Freq: Once | INTRAVENOUS | Status: AC
  Administered 2022-02-27: 20 mg via INTRAVENOUS
  Filled 2022-02-27: qty 50

## 2022-02-27 MED ORDER — ONDANSETRON 4 MG PO TBDP: 4.0000 mg | ORAL_TABLET | Freq: Three times a day (TID) | ORAL | 0 refills | Status: DC | PRN

## 2022-02-27 MED ORDER — LORATADINE 10 MG PO TABS
10.0000 mg | ORAL_TABLET | Freq: Once | ORAL | Status: AC
  Administered 2022-02-27: 10 mg via ORAL
  Filled 2022-02-27: qty 1

## 2022-02-27 MED ORDER — ACETAMINOPHEN 325 MG PO TABS
650.0000 mg | ORAL_TABLET | Freq: Once | ORAL | Status: AC
  Administered 2022-02-27: 650 mg via ORAL
  Filled 2022-02-27: qty 2

## 2022-02-27 NOTE — Patient Instructions (Signed)
[facility]  Discharge Instructions: Thank you for choosing [facility] to provide your oncology and hematology care.  If you have a lab appointment with the Cancer Center, please come in thru the Main Entrance and check in at the main information desk.  Wear comfortable clothing and clothing appropriate for easy access to any Portacath or PICC line.   We strive to give you quality time with your provider. You may need to reschedule your appointment if you arrive late (15 or more minutes).  Arriving late affects you and other patients whose appointments are after yours.  Also, if you miss three or more appointments without notifying the office, you may be dismissed from the clinic at the provider's discretion.      For prescription refill requests, have your pharmacy contact our office and allow 72 hours for refills to be completed.    Today you received the following chemotherapy and/or immunotherapy agents Feraheme      To help prevent nausea and vomiting after your treatment, we encourage you to take your nausea medication as directed.  BELOW ARE SYMPTOMS THAT SHOULD BE REPORTED IMMEDIATELY: *FEVER GREATER THAN 100.4 F (38 C) OR HIGHER *CHILLS OR SWEATING *NAUSEA AND VOMITING THAT IS NOT CONTROLLED WITH YOUR NAUSEA MEDICATION *UNUSUAL SHORTNESS OF BREATH *UNUSUAL BRUISING OR BLEEDING *URINARY PROBLEMS (pain or burning when urinating, or frequent urination) *BOWEL PROBLEMS (unusual diarrhea, constipation, pain near the anus) TENDERNESS IN MOUTH AND THROAT WITH OR WITHOUT PRESENCE OF ULCERS (sore throat, sores in mouth, or a toothache) UNUSUAL RASH, SWELLING OR PAIN  UNUSUAL VAGINAL DISCHARGE OR ITCHING   Items with * indicate a potential emergency and should be followed up as soon as possible or go to the Emergency Department if any problems should occur.  Please show the CHEMOTHERAPY ALERT CARD or IMMUNOTHERAPY ALERT CARD at check-in to the  Emergency Department and triage nurse.  Should you have questions after your visit or need to cancel or reschedule your appointment, please contact [facility] [phone]  and follow the prompts.  Office hours are 8:00 a.m. to 4:30 p.m. Monday - Friday. Please note that voicemails left after 4:00 p.m. may not be returned until the following business day.  We are closed weekends and major holidays. You have access to a nurse at all times for urgent questions. Please call the main number to the clinic [phone] and follow the prompts.  For any non-urgent questions, you may also contact your provider using MyChart. We now offer e-Visits for anyone 18 and older to request care online for non-urgent symptoms. For details visit [email or web address].   Also download the MyChart app! Go to the app store, search "MyChart", open the app, select [facility], and log in with your MyChart username and password.  Masks are optional in the cancer centers. If you would like for your care team to wear a mask while they are taking care of you, please let them know. You may have one support person who is at least 36 years old accompany you for your appointments.  

## 2022-02-27 NOTE — Progress Notes (Signed)
Patient presents today for Feraheme infusion per providers order.  Vital signs within parameters for treatment.  Patient has no new complaints at this time.  Peripheral IV started and blood return noted pre and post infusion.    Stable during infusion without adverse affects.  Vital signs stable.  No complaints at this time.  Discharge from clinic ambulatory in stable condition.  Alert and oriented X 3.  Follow up with [facility] as scheduled.  

## 2022-03-04 ENCOUNTER — Encounter: Payer: Self-pay | Admitting: *Deleted

## 2022-03-05 ENCOUNTER — Ambulatory Visit (INDEPENDENT_AMBULATORY_CARE_PROVIDER_SITE_OTHER): Payer: Medicare HMO | Admitting: Neurology

## 2022-03-05 ENCOUNTER — Encounter: Payer: Self-pay | Admitting: Hematology

## 2022-03-05 ENCOUNTER — Encounter: Payer: Self-pay | Admitting: Neurology

## 2022-03-05 VITALS — BP 121/79 | HR 84 | Ht 62.0 in | Wt 181.2 lb

## 2022-03-05 DIAGNOSIS — E669 Obesity, unspecified: Secondary | ICD-10-CM

## 2022-03-05 DIAGNOSIS — R351 Nocturia: Secondary | ICD-10-CM | POA: Diagnosis not present

## 2022-03-05 DIAGNOSIS — R0683 Snoring: Secondary | ICD-10-CM

## 2022-03-05 DIAGNOSIS — R519 Headache, unspecified: Secondary | ICD-10-CM

## 2022-03-05 DIAGNOSIS — G4719 Other hypersomnia: Secondary | ICD-10-CM | POA: Diagnosis not present

## 2022-03-05 NOTE — Progress Notes (Signed)
Subjective:    Patient ID: Charlotte Mccormick is a 36 y.o. female.  HPI    Star Age, MD, PhD [facility] [address], [address] [place], [place] [address]  Dear Charlotte Mccormick,   I saw your patient, Charlotte Mccormick, upon your kind request in my sleep clinic today for initial consultation of her sleep disorder, in particular, concern for underlying obstructive sleep apnea.  The patient is unaccompanied today.  As you know, [name] is a 36 year old female with an underlying medical history of neck pain, reflux disease, thyroid disease, vitamin D deficiency, anemia, asthma, anxiety, depression, PTSD, and obesity, who reports snoring and excessive daytime somnolence as well as waking up with a sense of gasping for air. I reviewed your office note from 07/24/2021.  Her Epworth sleepiness score is 13 out of 24, fatigue severity score is 61 out of 63.  She has had symptoms of her sleep disturbance for years.  Sometimes she wakes up with a dull, achy headache, sometimes she has a headache all day.  She has a history of migraines and takes Maxalt as needed.  She goes to bed generally around 10 and rise time is 7:30 AM, may be as late as 10 or 11 AM if she does not have to get her kids ready for school.  She lives with her husband and 3 children.  She is on disability.  She is followed by mental health and had been on Ambien per psychiatry but this was discontinued, she then had a prescription for hydroxyzine 25 mg strength but it was too strong for her, she now has a prescription for hydroxyzine 10 mg strength and takes it up to twice daily as needed.  She has no family history of sleep apnea.  She drinks caffeine in the form of soda and sweet tea, about 3-4 servings altogether in an average day.  She does not currently drink any alcohol, she quit smoking about 8 years ago.  She has a TV in her bedroom and watches some before falling asleep or watch something on  her phone.  They have no pets in the household.   Her Past Medical History Is Significant For: Past Medical History:  Diagnosis Date   Abnormal uterine bleeding (AUB) 12/12/2015   Anemia    Anxiety    Asthma    Constipation    Depression    Fibroid    Genital herpes    GERD (gastroesophageal reflux disease)    History of chlamydia    Hyperemesis    IBS (irritable bowel syndrome)    Insomnia    Mental disorder    PTSD, ANXIETY,Depression   PTSD (post-traumatic stress disorder)    Sinusitis    Thyroid disease    Vaginal dryness 12/12/2015    Her Past Surgical History Is Significant For: Past Surgical History:  Procedure Laterality Date   COLONOSCOPY     2016   COLONOSCOPY N/A 04/08/2016   Procedure: COLONOSCOPY;  Surgeon: Danie Binder, MD;  [facility];  Service: Endoscopy;  Laterality: N/A;  10:15 AM   COLONOSCOPY WITH PROPOFOL N/A 07/24/2020   Procedure: COLONOSCOPY WITH PROPOFOL;  Surgeon: [name], DO;  Location: [facility];  Service: Endoscopy;  Laterality: N/A;  11:00am   ESOPHAGOGASTRODUODENOSCOPY N/A 04/08/2016   Procedure: ESOPHAGOGASTRODUODENOSCOPY (EGD);  Surgeon: Danie Binder, MD;  [facility];  Service: Endoscopy;  Laterality: N/A;   ESOPHAGOGASTRODUODENOSCOPY (EGD) WITH PROPOFOL N/A 01/17/2020  Procedure: ESOPHAGOGASTRODUODENOSCOPY (EGD) WITH PROPOFOL;  Surgeon: [name], DO;  [facility];  Service: Endoscopy;  Laterality: N/A;  8:15am   GIVENS CAPSULE STUDY N/A 09/28/2020   Procedure: GIVENS CAPSULE STUDY;  Surgeon: [name], DO;  [facility];  Service: Endoscopy;  Laterality: N/A;  7:30am   PERINEUM REPAIR     UPPER GASTROINTESTINAL ENDOSCOPY  2016    Her Family History Is Significant For: Family History  Problem Relation Age of Onset   Heart disease Mother    Hypertension Mother    Hyperlipidemia Mother    Heart attack Mother    Heart failure Mother    Hypertension Father     Hyperlipidemia Father    Diabetes Father    Cancer Father    Thyroid disease Father    Stroke Father    Epilepsy Brother    Eczema Daughter    Autism spectrum disorder Son    Colon cancer Neg Hx    Sleep apnea Neg Hx     Her Social History Is Significant For: Social History   Socioeconomic History   Marital status: Married    Spouse name: Not on file   Number of children: 3   Years of education: Not on file   Highest education level: Not on file  Occupational History   Not on file  Tobacco Use   Smoking status: Former    Packs/day: 0.50    Years: 5.00    Total pack years: 2.50    Types: Cigarettes    Quit date: 06/03/2010    Years since quitting: 11.7   Smokeless tobacco: Never  Vaping Use   Vaping Use: Never used  Substance and Sexual Activity   Alcohol use: Not Currently    Alcohol/week: 2.0 standard drinks of alcohol    Types: 2 Glasses of wine per week    Comment: occ wine; denied 11/02/19   Drug use: Not Currently    Types: Marijuana    Comment: 11/02/19 states she used gummies 1-2 times a month.   Sexual activity: Yes    Birth control/protection: Surgical    Comment: vasectomy  Other Topics Concern   Not on file  Social History Narrative   ** Merged History Encounter **       Social Determinants of Health   Financial Resource Strain: Not on file  Food Insecurity: Not on file  Transportation Needs: Not on file  Physical Activity: Not on file  Stress: Not on file  Social Connections: Not on file    Her Allergies Are:  Allergies  Allergen Reactions   Antihistamines, Diphenhydramine-Type Anxiety    Patient reports she cannot tolerate diphenhydramine with symptoms of anxiety   Bee Venom Swelling and Other (See Comments)    Reaction:  Localized swelling    Hydrocodone Itching   Penicillins Itching and Other (See Comments)    Has patient had a PCN reaction causing immediate rash, facial/tongue/throat swelling, SOB or lightheadedness with hypotension:  No Has patient had a PCN reaction causing severe rash involving mucus membranes or skin necrosis: No Has patient had a PCN reaction that required hospitalization No Has patient had a PCN reaction occurring within the last 10 years: Yes If all of the above answers are "NO", then may proceed with Cephalosporin use.    Venofer [Iron Sucrose] Nausea And Vomiting and Other (See Comments)    Diaphoresis and vomiting   Reglan [Metoclopramide] Anxiety  :   Her Current Medications  Are:  Outpatient Encounter Medications as of 03/05/2022  Medication Sig   acetaminophen (TYLENOL) 500 MG tablet Take 500 mg by mouth every 6 (six) hours as needed for mild pain or moderate pain.   albuterol (VENTOLIN HFA) 108 (90 Base) MCG/ACT inhaler Inhale 1 puff into the lungs every 6 (six) hours as needed for wheezing or shortness of breath.   dexlansoprazole (DEXILANT) 60 MG capsule Take 1 capsule (60 mg total) by mouth daily.   fluticasone (FLONASE) 50 MCG/ACT nasal spray SMARTSIG:1-2 Spray(s) Both Nares Daily   hydrOXYzine (ATARAX) 25 MG tablet Take 10 mg by mouth every 8 (eight) hours as needed.   meloxicam (MOBIC) 15 MG tablet Take 15 mg by mouth daily as needed for pain.   methimazole (TAPAZOLE) 5 MG tablet Take 0.5 tablets (2.5 mg total) by mouth daily.   montelukast (SINGULAIR) 10 MG tablet Take 10 mg by mouth daily.   MYFEMBREE 40-1-0.5 MG TABS Take 1 tablet by mouth once daily   ondansetron (ZOFRAN-ODT) 4 MG disintegrating tablet Take 1 tablet (4 mg total) by mouth every 8 (eight) hours as needed for nausea or vomiting.   potassium chloride SA (KLOR-CON M) 20 MEQ tablet Take 1 tablet (20 mEq total) by mouth daily.   rizatriptan (MAXALT) 10 MG tablet Take 1 tablet by mouth daily as needed. At onset of headache   Vitamin D, Ergocalciferol, (DRISDOL) 1.25 MG (50000 UNIT) CAPS capsule Take 1 capsule (50,000 Units total) by mouth every 7 (seven) days.   buPROPion (WELLBUTRIN XL) 300 MG 24 hr tablet Take 1 tablet  (300 mg total) by mouth daily.   escitalopram (LEXAPRO) 10 MG tablet Take 1 tablet (10 mg total) by mouth daily.   prazosin (MINIPRESS) 5 MG capsule Take 1 capsule (5 mg total) by mouth at bedtime.   [DISCONTINUED] LO LOESTRIN FE 1 MG-10 MCG / 10 MCG tablet Take 1 tablet by mouth daily. (Patient not taking: Reported on 10/13/2019)   [DISCONTINUED] zolpidem (AMBIEN CR) 12.5 MG CR tablet Take 1 tablet (12.5 mg total) by mouth at bedtime as needed for sleep. (Patient not taking: No sig reported)   No facility-administered encounter medications on file as of 03/05/2022.  :   Review of Systems:  Out of a complete 14 point review of systems, all are reviewed and negative with the exception of these symptoms as listed below:   Review of Systems  Neurological:        Pt here for Sleep Consult Pt snores,headaches,fatigue Pt denies hypertension,sleep study,CPAP machine     ESS:13 FSS;61     Objective:  Neurological Exam  Physical Exam Physical Examination:   Vitals:   03/05/22 0919  BP: 121/79  Pulse: 84    General Examination: The patient is a very pleasant 36 y.o. female in no acute distress. She appears well-developed and well-nourished and well groomed.   HEENT: Normocephalic, atraumatic, pupils are equal, round and reactive to light, corrective eyeglasses in place.  Extraocular tracking is good without limitation to gaze excursion or nystagmus noted. Hearing is grossly intact. Face is symmetric with normal facial animation. Speech is clear with no dysarthria noted. There is no hypophonia. There is no lip, neck/head, jaw or voice tremor. Neck is supple with full range of passive and active motion. There are no carotid bruits on auscultation. Oropharynx exam reveals: mild mouth dryness, adequate dental hygiene and moderate airway crowding, due to thicker soft palate and wider uvula, tonsils small, slightly small airway entry, Mallampati class II.  Neck circumference of 14-7/8 inches,  moderate overbite.  Tongue protrudes centrally and palate elevates symmetrically.  Chest: Clear to auscultation without wheezing, rhonchi or crackles noted.  Heart: S1+S2+0, regular and normal without murmurs, rubs or gallops noted.   Abdomen: Soft, non-tender and non-distended.  Extremities: There is no pitting edema in the distal lower extremities bilaterally.   Skin: Warm and dry without trophic changes noted.   Musculoskeletal: exam reveals no obvious joint deformities.   Neurologically:  Mental status: The patient is awake, alert and oriented in all 4 spheres. Her immediate and remote memory, attention, language skills and fund of knowledge are appropriate. There is no evidence of aphasia, agnosia, apraxia or anomia. Speech is clear with normal prosody and enunciation. Thought process is linear. Mood is normal and affect is normal.  Cranial nerves II - XII are as described above under HEENT exam.  Motor exam: Normal bulk, strength and tone is noted. There is no obvious action or resting tremor.  Fine motor skills and coordination: grossly intact.  Cerebellar testing: No dysmetria or intention tremor. There is no truncal or gait ataxia.  Sensory exam: intact to light touch in the upper and lower extremities.  Gait, station and balance: She stands easily. No veering to one side is noted. No leaning to one side is noted. Posture is age-appropriate and stance is narrow based. Gait shows normal stride length and normal pace. No problems turning are noted.   Assessment and Plan:  In summary, Nakiah Brandel is a very pleasant 36 y.o.-year old female with an underlying medical history of neck pain, reflux disease, thyroid disease, vitamin D deficiency, anemia, asthma, anxiety, depression, PTSD, and obesity, whose history and physical exam are concerning for sleep disordered breathing, supporting a current working diagnosis of unspecified sleep apnea, with the main differential diagnoses of  obstructive sleep apnea (OSA) versus upper airway resistance syndrome (UARS) versus central sleep apnea (CSA), or mixed sleep apnea. A laboratory attended sleep study is considered gold standard for evaluation of sleep disordered breathing and is recommended at this time and clinically justified.   I had a long chat with the patient about my findings and the diagnosis of sleep apnea, particularly OSA, its prognosis and treatment options. We talked about medical/conservative treatments, surgical interventions and non-pharmacological approaches for symptom control. I explained, in particular, the risks and ramifications of untreated moderate to severe OSA, especially with respect to developing cardiovascular disease down the road, including congestive heart failure (CHF), difficult to treat hypertension, cardiac arrhythmias (particularly A-fib), neurovascular complications including TIA, stroke and dementia. Even type 2 diabetes has, in part, been linked to untreated OSA. Symptoms of untreated OSA may include (but may not be limited to) daytime sleepiness, nocturia (i.e. frequent nighttime urination), memory problems, mood irritability and suboptimally controlled or worsening mood disorder such as depression and/or anxiety, lack of energy, lack of motivation, physical discomfort, as well as recurrent headaches, especially morning or nocturnal headaches. We talked about the importance of maintaining a healthy lifestyle and striving for healthy weight.  In addition, we talked about the importance of striving for and maintaining good sleep hygiene. I recommended the following at this time: sleep study.  I outlined the differences between a laboratory attended sleep study which is considered more comprehensive and accurate over the option of a home sleep test (HST); the latter may lead to underestimation of sleep disordered breathing in some instances and does not help with diagnosing upper airway resistance syndrome  and is not  accurate enough to diagnose primary central sleep apnea typically. I explained the different sleep test procedures to the patient in detail and also outlined possible surgical and non-surgical treatment options of OSA, including the use of a pressure airway pressure (PAP) device (ie CPAP, AutoPAP/APAP or BiPAP in certain circumstances), a custom-made dental device (aka oral appliance, which would require a referral to a specialist dentist or orthodontist typically, and is generally speaking not considered a good choice for patients with full dentures or edentulous state), upper airway surgical options, such as traditional UPPP (which is not considered a first-line treatment) or the Inspire device (hypoglossal nerve stimulator, which would involve a referral for consultation with an ENT surgeon, after careful selection, following inclusion criteria). I explained the PAP treatment option to the patient in detail, as this is generally considered first-line treatment.  The patient indicated that she would be willing to try PAP therapy, if the need arises. I explained the importance of being compliant with PAP treatment, not only for insurance purposes but primarily to improve patient's symptoms symptoms, and for the patient's long term health benefit, including to reduce Her cardiovascular risks longer-term.    We will pick up our discussion about the next steps and treatment options after testing.  We will keep her posted as to the test results by phone call and/or MyChart messaging where possible.  We will plan to follow-up in sleep clinic accordingly as well.  I answered all her questions today and the patient was in agreement.   I encouraged her to call with any interim questions, concerns, problems or updates or email [place] through [facility].  Generally speaking, sleep test authorizations may take up to 2 weeks, sometimes less, sometimes longer, the patient is encouraged to get in touch with [place] if they  do not hear back from the sleep lab staff directly within the next 2 weeks.  Thank you very much for allowing me to participate in the care of this nice patient. If I can be of any further assistance to you please do not hesitate to call me at [phone].  Sincerely,   Star Age, MD, PhD

## 2022-03-05 NOTE — Patient Instructions (Signed)

## 2022-03-12 ENCOUNTER — Encounter: Payer: Self-pay | Admitting: Hematology

## 2022-03-17 ENCOUNTER — Other Ambulatory Visit: Payer: Self-pay | Admitting: Physician Assistant

## 2022-03-17 DIAGNOSIS — R110 Nausea: Secondary | ICD-10-CM

## 2022-03-25 ENCOUNTER — Other Ambulatory Visit: Payer: Self-pay | Admitting: *Deleted

## 2022-03-25 ENCOUNTER — Encounter: Payer: Self-pay | Admitting: Hematology

## 2022-03-25 DIAGNOSIS — R110 Nausea: Secondary | ICD-10-CM

## 2022-03-25 MED ORDER — ONDANSETRON 4 MG PO TBDP: 4.0000 mg | ORAL_TABLET | Freq: Three times a day (TID) | ORAL | 0 refills | Status: DC | PRN

## 2022-03-26 ENCOUNTER — Telehealth: Payer: Self-pay | Admitting: Neurology

## 2022-03-26 NOTE — Telephone Encounter (Signed)
[employer] pending uploaded notes  

## 2022-03-27 ENCOUNTER — Ambulatory Visit (INDEPENDENT_AMBULATORY_CARE_PROVIDER_SITE_OTHER): Payer: Medicare HMO | Admitting: Neurology

## 2022-03-27 DIAGNOSIS — R0683 Snoring: Secondary | ICD-10-CM

## 2022-03-27 DIAGNOSIS — R519 Headache, unspecified: Secondary | ICD-10-CM

## 2022-03-27 DIAGNOSIS — G4720 Circadian rhythm sleep disorder, unspecified type: Secondary | ICD-10-CM

## 2022-03-27 DIAGNOSIS — G4719 Other hypersomnia: Secondary | ICD-10-CM

## 2022-03-27 DIAGNOSIS — R351 Nocturia: Secondary | ICD-10-CM

## 2022-03-27 DIAGNOSIS — E669 Obesity, unspecified: Secondary | ICD-10-CM

## 2022-03-27 NOTE — Telephone Encounter (Signed)
[employer]- [employer] Josem Kaufmann: [id number] (exp. 03/26/22 to 09/22/22).  Patient is scheduled at [facility] for 03/27/22 at 9 pm.

## 2022-03-31 ENCOUNTER — Encounter: Payer: Self-pay | Admitting: Neurology

## 2022-04-02 NOTE — Procedures (Signed)
Physician Interpretation:     [facility] POLYSOMNOGRAPHY  INTERPRETATION REPORT   STUDY DATE:  03/27/2022     PATIENT NAME:  Charlotte Mccormick         DATE OF BIRTH:  Aug 08, 1985  PATIENT ID:  [id number]    TYPE OF STUDY:  PSG  READING PHYSICIAN: Star Age, MD REFERRED BY: Denyce Robert, NP SCORING TECHNICIAN: Gaylyn Cheers, RPSGT   HISTORY:  36 year old female with an underlying medical history of neck pain, reflux disease, thyroid disease,?vitamin D deficiency, anemia, asthma, anxiety, depression, PTSD, and obesity, who reports snoring and excessive daytime somnolence as well as waking up with a sense of gasping for air.?Her Epworth sleepiness score is 13 out of 24, fatigue severity score is 61 out of 63.? Patient took hydroxyzine 10 mg at the beginning of the sleep study.   Height: 62 in Weight: 181 lb (BMI 33) Neck Size: 15 in  MEDICATIONS: Tylenol, Ventolin HFA, Dexilant, Flonase, Atarax, Mobic, Tapazole, Singulair, Myfembree, Zofran-ODT, Klor-Con, Maxalt, Drisdol, Wellbutrin, Lexapro, Minioress TECHNICAL DESCRIPTION: A registered sleep technologist  was in attendance for the duration of the recording.  Data collection, scoring, video monitoring, and reporting were performed in compliance with the AASM Manual for the Scoring of Sleep and Associated Events; (Hypopnea is scored based on the criteria listed in Section VIII D. 1b in the AASM Manual V2.6 using a 4% oxygen desaturation rule or Hypopnea is scored based on the criteria listed in Section VIII D. 1a in the AASM Manual V2.6 using 3% oxygen desaturation and /or arousal rule).   SLEEP CONTINUITY AND SLEEP ARCHITECTURE:  Lights-out was at 21:51: and lights-on at  05:24:, with a total recording time of 7 hours, 32.5 min. Total sleep time ( TST) was 365.0 minutes with a decreased sleep efficiency at 80.7%.  BODY POSITION:  TST was divided  between the following sleep positions: 11.0% supine;   89.0% lateral;  0% prone. Duration of total sleep and percent of total sleep in their respective position is as follows: supine 40 minutes (11%), non-supine 325 minutes (89%); right 76 minutes (21%), left 249 minutes (68%), and prone 00 minutes (0%).  Total supine REM sleep time was 00 minutes (0% of total REM sleep).  Sleep latency was increased at 50.0 minutes.  REM sleep latency was normal at 77.0 minutes. Of the total sleep time, the percentage of stage N1 sleep was 6.6%, stage N2 sleep was 62%, which is increased, stage N3 sleep was 18.1%, which is normal, and REM sleep was 13.7%, which is reduced. Wake after sleep onset (WASO) time accounted for 37.5 minutes with minimal to mild sleep fragmentation noted.  RESPIRATORY MONITORING:  Based on CMS criteria (using a 4% oxygen desaturation rule for scoring hypopneas), there were 0 apneas (0 obstructive; 0 central; 0 mixed), and 0 hypopneas.  Apnea index was 0.0. Hypopnea index was 0.0. The apnea-hypopnea index was 0.0 overall (0.0 supine, 0 non-supine; 0.0 REM, 0.0 supine REM).  There were 0 respiratory effort-related arousals (RERAs).  The RERA index was 0 events/h. Total respiratory disturbance index (RDI) was 0.0 events/h. RDI results showed: supine RDI  0.0 /h; non-supine RDI 0.0 /h; REM RDI 0.0 /h, supine REM RDI 0.0 /h.   Based on AASM criteria (using a 3% oxygen desaturation and /or arousal rule for scoring hypopneas), there were 0 apneas (0 obstructive; 0 central; 0 mixed), and 0 hypopneas. Apnea index was 0.0. Hypopnea index was 0.0. The apnea-hypopnea index was 0.0 overall (  0.0 supine, 0 non-supine; 0.0 REM, 0.0 supine REM).  There were 0 respiratory effort-related arousals (RERAs).  The RERA index was 0 events/h. Total respiratory disturbance index (RDI) was 0.0 events/h. RDI results showed: supine RDI  0.0 /h; non-supine RDI 0.0 /h; REM RDI 0.0 /h, supine REM RDI 0.0 /h.  OXIMETRY: Oxyhemoglobin Saturation Nadir during sleep was at  94%) from a  mean of 97%.  Of the Total sleep time (TST)   hypoxemia (=<88%) was present for  0 minutes, or 0.0% of total sleep time.  LIMB MOVEMENTS: There were 0 periodic limb movements of sleep (0.0/hr), of which 0 (0.0/hr) were associated with an arousal. AROUSAL: There were 11 arousals in total, for an arousal index of 2 arousals/hour.  Of these, 0 were identified as respiratory-related arousals (0 /h), 0 were PLM-related arousals (0 /h), and 21 were non-specific arousals (3 /h). EEG: Review of the EEG showed no abnormal electrical discharges and symmetrical bihemispheric findings.   EKG: The EKG revealed normal sinus rhythm (NSR). The average heart rate during sleep was 77 bpm.  AUDIO/VIDEO REVIEW: The audio and video review did not show any abnormal or unusual behaviors, movements, phonations or vocalizations. The patient took 1 restroom break. Snoring was mild, intermittent. POST-STUDY QUESTIONNAIRE: Post study, the patient indicated, that sleep was better than usual. The patient stated in the comment section that "Rodman Key was very nice and thorough communication".  IMPRESSION:  1. Primary Snoring 2. Dysfunctions associated with sleep stages or arousal from sleep  RECOMMENDATIONS:  1. This study does not demonstrate any significant obstructive or central sleep disordered breathing with an AHI of less than 5/hour -her AHI was 0/h, O2 nadir 94% with mild intermittent snoring noted. Treatment with a positive airway pressure device, such as CPAP or autoPAP is not indicated. Weight loss and avoiding the supine sleep position may aid in reducing her snoring.  2. This study shows minimal to mild sleep fragmentation and mildly abnormal sleep stage percentages; these are nonspecific findings and per se do not signify an intrinsic sleep disorder or a cause for the patient's sleep-related symptoms. Causes include (but are not limited to) the first night effect of the sleep study, circadian rhythm disturbances,  medication effect or an underlying mood disorder or medical problem.  3. The patient should be cautioned not to drive, work at heights, or operate dangerous or heavy equipment when tired or sleepy. Review and reiteration of good sleep hygiene measures should be pursued with any patient. 4. The patient will be advised to follow up with the referring provider, who will be notified of the test results.   I certify that I have reviewed the entire raw data recording prior to the issuance of this report in accordance with the Standards of Accreditation of the American Academy of Sleep Medicine (AASM). Star Age, MD, PhD [facility]) [place], ABPN (Neurology and Sleep)            Technical Report:   General Information  Name: Charlotte Mccormick, Charlotte Mccormick BMI: 33.11 Physician: Star Age, MD  ID: [id number] Height: 62.0 in Technician: Gaylyn Cheers, RPSGT  Sex: Female Weight: 181.0 lb Record: [id number]  Age: 42 [1985-08-24] Date: 03/27/2022    Medical & Medication History    Ms. Mccormick is a 36 year old female with an underlying medical history of neck pain, reflux disease, thyroid disease, vitamin D deficiency, anemia, asthma, anxiety, depression, PTSD, and obesity, who reports snoring and excessive daytime somnolence as well as waking up with a  sense of gasping for air. Her Epworth sleepiness score is 13 out of 24, fatigue severity score is 61 out of 63. She has had symptoms of her sleep disturbance for years. Sometimes she wakes up with a dull, achy headache, sometimes she has a headache all day. She has a history of migraines and takes Maxalt as needed. Tylenol, Ventolin HFA, Dexilant, Flonase, Atarax, Mobic, Tapazole, Singulair, Myfembree, Zofran-ODT, Klor-Con, Maxalt, Drisdol, Wellbutrin, Lexapro, Minioress   Sleep Disorder      Comments   Patient arrived for a diagnostic polysomnogram. Procedure explained and all questions answered. Standard paste setup  without complications. Patient slept supine, right, and left side. Mild snoring was heard. No respiratory events observed, primarily in REM sleep. No obvious cardiac arrhythmias observed. No PLMS observed. Patient had one restroom visit. Patient took 10 mg Hydroxyzine shortly after lights out.    Lights out: 09:51:51 PM Lights on: 05:24:06 AM   Time Total Supine Side Prone Upright  Recording (TRT) 7h 32.69m0h 44.5516mh 48.3516m21m 0.3516m 216m0.3516m  68mep (TST) 6h 5.3516m 0h78m.3516m 5h 9m3516m 0h 033516m 0h 0.58m  Late616m N1 N2 N3 REM Onset Per. Slp. Eff.  Actual 0h 0.3516m 0h 5.16m816m 32.3516m33516m 17.3516m 26m50.3516m 1716m.16m 80.63516m   Stg51mr Wake N1 N2 N3 REM  Total 87.5 24.0 225.0 66.0 50.0  Supine 4.5 5.0 35.0 0.0 0.0  Side 83.0 19.0 190.0 66.0 50.0  Prone 0.0 0.0 0.0 0.0 0.0  Upright 0.0 0.0 0.0 0.0 0.0   Stg % Wake N1 N2 N3 REM  Total 19.3 6.6 61.6 18.1 13.7  Supine 1.0 1.4 9.6 0.0 0.0  Side 18.3 5.2 52.1 18.1 13.7  Prone 0.0 0.0 0.0 0.0 0.0  Upright 0.0 0.0 0.0 0.0 0.0     Apnea Summary Sub Supine Side Prone Upright  Total 0 Total 0 0 0 0 0    REM 0 0 0 0 0    NREM 0 0 0 0 0  Obs 0 REM 0 0 0 0 0    NREM 0 0 0 0 0  Mix 0 REM 0 0 0 0 0    NREM 0 0 0 0 0  Cen 0 REM 0 0 0 0 0    NREM 0 0 0 0 0   Rera Summary Sub Supine Side Prone Upright  Total 0 Total 0 0 0 0 0    REM 0 0 0 0 0    NREM 0 0 0 0 0   Hypopnea Summary Sub Supine Side Prone Upright  Total 0 Total 0 0 0 0 0    REM 0 0 0 0 0    NREM 0 0 0 0 0   4% Hypopnea Summary Sub Supine Side Prone Upright  Total (4%) 0 Total 0 0 0 0 0    REM 0 0 0 0 0    NREM 0 0 0 0 0     AHI Total Obs Mix Cen  0.00 Apnea 0.00 0.00 0.00 0.00   Hypopnea 0.00 -- -- --  0.00 Hypopnea (4%) 0.00 -- -- --    Total Supine Side Prone Upright  Position AHI 0.00 0.00 0.00 0.00 0.00  REM AHI 0.00   NREM AHI 0.00   Position RDI 0.00 0.00 0.00 0.00 0.00  REM RDI 0.00   NREM RDI 0.00    4% Hypopnea Total Supine Side Prone Upright  Position AHI (4%) 0.00 0.00  0.00  0.00 0.00  REM AHI (4%) 0.00   NREM AHI (4%) 0.00   Position RDI (4%) 0.00 0.00 0.00 0.00 0.00  REM RDI (4%) 0.00   NREM RDI (4%) 0.00    Desaturation Information Threshold: 2% <100% <90% <80% <70% <60% <50% <40%  Supine 20.0 0.0 0.0 0.0 0.0 0.0 0.0  Side 72.0 0.0 0.0 0.0 0.0 0.0 0.0  Prone 0.0 0.0 0.0 0.0 0.0 0.0 0.0  Upright 0.0 0.0 0.0 0.0 0.0 0.0 0.0  Total 92.0 0.0 0.0 0.0 0.0 0.0 0.0  Index 13.7 0.0 0.0 0.0 0.0 0.0 0.0   Threshold: 3% <100% <90% <80% <70% <60% <50% <40%  Supine 2.0 0.0 0.0 0.0 0.0 0.0 0.0  Side 6.0 0.0 0.0 0.0 0.0 0.0 0.0  Prone 0.0 0.0 0.0 0.0 0.0 0.0 0.0  Upright 0.0 0.0 0.0 0.0 0.0 0.0 0.0  Total 8.0 0.0 0.0 0.0 0.0 0.0 0.0  Index 1.2 0.0 0.0 0.0 0.0 0.0 0.0   Threshold: 4% <100% <90% <80% <70% <60% <50% <40%  Supine 1.0 0.0 0.0 0.0 0.0 0.0 0.0  Side 2.0 0.0 0.0 0.0 0.0 0.0 0.0  Prone 0.0 0.0 0.0 0.0 0.0 0.0 0.0  Upright 0.0 0.0 0.0 0.0 0.0 0.0 0.0  Total 3.0 0.0 0.0 0.0 0.0 0.0 0.0  Index 0.4 0.0 0.0 0.0 0.0 0.0 0.0   Threshold: 3% <100% <90% <80% <70% <60% <50% <40%  Supine 2 0 0 0 0 0 0  Side 6 0 0 0 0 0 0  Prone 0 0 0 0 0 0 0  Upright 0 0 0 0 0 0 0  Total 8 0 0 0 0 0 0   Awakening/Arousal Information # of Awakenings 21  Wake after sleep onset 37.58m Wake after persistent sleep 28.017m Arousal Assoc. Arousals Index  Apneas 0 0.0  Hypopneas 0 0.0  Leg Movements 4 0.7  Snore 0 0.0  PTT Arousals 0 0.0  Spontaneous 21 3.5  Total 25 4.1  Leg Movement Information PLMS LMs Index  Total LMs during PLMS 0 0.0  LMs w/ Microarousals 0 0.0   LM LMs Index  w/ Microarousal 4 0.7  w/ Awakening 1 0.2  w/ Resp Event 0 0.0  Spontaneous 8 1.3  Total 12 2.0     Desaturation threshold setting: 3% Minimum desaturation setting: 10 seconds SaO2 nadir: 93% The longest event was a 0 sec N/A Apnea with a minimum SaO2 of --%. The lowest SaO2 was --% associated with a 00 sec N/A Apnea. EKG Rates EKG Avg Max Min  Awake 80 105 69  Asleep 77  91 67  EKG Events: Tachycardia

## 2022-04-06 ENCOUNTER — Other Ambulatory Visit: Payer: Self-pay | Admitting: "Endocrinology

## 2022-04-29 NOTE — Progress Notes (Deleted)
[facility] [address], [place] [address]   CLINIC:  Medical Oncology/Hematology  PCP:  Marylynn Pearson, FNP [address] [place] [address] [phone]   REASON FOR VISIT:  Follow-up for iron deficiency anemia  CURRENT THERAPY: Intermittent IV iron infusions  INTERVAL HISTORY:  Charlotte Mccormick 36 y.o. female returns for routine follow-up of her iron deficiency anemia.  She was last seen by Rojelio Brenner PA-C on 02/27/2022.  At today's visit, she reports feeling fatigued.  *** She reports that she had been doing better after starting Myfembree, but she was unable to obtain her medication over the past several months due to financial constraints, which resulted in recurrent menorrhagia.  *** Since that time, she has had increasing fatigue and dizzy spells as well as recurrent night sweats. *** She denies any other source of blood loss such as melena, hematochezia, or epistaxis.  *** No pica, restless legs, chest pain, dyspnea.  *** No fevers, chills, or unintentional weight loss. ***  She reports ***% energy and ***% appetite.  She is maintaining a stable weight at this time.   REVIEW OF SYSTEMS:   *** Review of Systems  Constitutional:  Positive for fatigue. Negative for appetite change, chills, diaphoresis, fever and unexpected weight change.  HENT:   Negative for lump/mass and nosebleeds.   Eyes:  Negative for eye problems.  Respiratory:  Negative for cough, hemoptysis and shortness of breath.   Cardiovascular:  Negative for chest pain, leg swelling and palpitations.  Gastrointestinal:  Negative for abdominal pain, blood in stool, constipation, diarrhea, nausea and vomiting.  Endocrine: Positive for hot flashes (with night sweats).  Genitourinary:  Negative for hematuria.   Skin: Negative.   Neurological:  Positive for dizziness. Negative for headaches and light-headedness.  Hematological:  Does not bruise/bleed easily.   Psychiatric/Behavioral:  Positive for depression and sleep disturbance. The patient is nervous/anxious.       PAST MEDICAL/SURGICAL HISTORY:  Past Medical History:  Diagnosis Date   Abnormal uterine bleeding (AUB) 12/12/2015   Anemia    Anxiety    Asthma    Constipation    Depression    Fibroid    Genital herpes    GERD (gastroesophageal reflux disease)    History of chlamydia    Hyperemesis    IBS (irritable bowel syndrome)    Insomnia    Mental disorder    PTSD, ANXIETY,Depression   PTSD (post-traumatic stress disorder)    Sinusitis    Thyroid disease    Vaginal dryness 12/12/2015   Past Surgical History:  Procedure Laterality Date   COLONOSCOPY     2016   COLONOSCOPY N/A 04/08/2016   Procedure: COLONOSCOPY;  Surgeon: West Bali, MD;  [facility]: [facility];  Service: Endoscopy;  Laterality: N/A;  10:15 AM   COLONOSCOPY WITH PROPOFOL N/A 07/24/2020   Procedure: COLONOSCOPY WITH PROPOFOL;  Surgeon: Lanelle Bal, DO;  Location: [facility];  Service: Endoscopy;  Laterality: N/A;  11:00am   ESOPHAGOGASTRODUODENOSCOPY N/A 04/08/2016   Procedure: ESOPHAGOGASTRODUODENOSCOPY (EGD);  Surgeon: West Bali, MD;  Location: [facility];  Service: Endoscopy;  Laterality: N/A;   ESOPHAGOGASTRODUODENOSCOPY (EGD) WITH PROPOFOL N/A 01/17/2020   Procedure: ESOPHAGOGASTRODUODENOSCOPY (EGD) WITH PROPOFOL;  Surgeon: Lanelle Bal, DO;  Location: [facility];  Service: Endoscopy;  Laterality: N/A;  8:15am   GIVENS CAPSULE STUDY N/A 09/28/2020   Procedure: GIVENS CAPSULE STUDY;  Surgeon: Lanelle Bal, DO;  Location:  [facility];  Service: Endoscopy;  Laterality: N/A;  7:30am   PERINEUM REPAIR     UPPER GASTROINTESTINAL ENDOSCOPY  2016     SOCIAL HISTORY:  Social History   Socioeconomic History   Marital status: Married    Spouse name: Not on file   Number of children: 3   Years of education: Not on file   Highest education level: Not on file  Occupational  History   Not on file  Tobacco Use   Smoking status: Former    Packs/day: 0.50    Years: 5.00    Total pack years: 2.50    Types: Cigarettes    Quit date: 06/03/2010    Years since quitting: 11.9   Smokeless tobacco: Never  Vaping Use   Vaping Use: Never used  Substance and Sexual Activity   Alcohol use: Not Currently    Alcohol/week: 2.0 standard drinks of alcohol    Types: 2 Glasses of wine per week    Comment: occ wine; denied 11/02/19   Drug use: Not Currently    Types: Marijuana    Comment: 11/02/19 states she used gummies 1-2 times a month.   Sexual activity: Yes    Birth control/protection: Surgical    Comment: vasectomy  Other Topics Concern   Not on file  Social History Narrative   ** Merged History Encounter **       Social Determinants of Health   Financial Resource Strain: Not on file  Food Insecurity: Not on file  Transportation Needs: Not on file  Physical Activity: Not on file  Stress: Not on file  Social Connections: Not on file  Intimate Partner Violence: Not on file    FAMILY HISTORY:  Family History  Problem Relation Age of Onset   Heart disease Mother    Hypertension Mother    Hyperlipidemia Mother    Heart attack Mother    Heart failure Mother    Hypertension Father    Hyperlipidemia Father    Diabetes Father    Cancer Father    Thyroid disease Father    Stroke Father    Epilepsy Brother    Eczema Daughter    Autism spectrum disorder Son    Colon cancer Neg Hx    Sleep apnea Neg Hx     CURRENT MEDICATIONS:  Outpatient Encounter Medications as of 04/30/2022  Medication Sig   acetaminophen (TYLENOL) 500 MG tablet Take 500 mg by mouth every 6 (six) hours as needed for mild pain or moderate pain.   albuterol (VENTOLIN HFA) 108 (90 Base) MCG/ACT inhaler Inhale 1 puff into the lungs every 6 (six) hours as needed for wheezing or shortness of breath.   buPROPion (WELLBUTRIN XL) 300 MG 24 hr tablet Take 1 tablet (300 mg total) by mouth daily.    dexlansoprazole (DEXILANT) 60 MG capsule Take 1 capsule (60 mg total) by mouth daily.   escitalopram (LEXAPRO) 10 MG tablet Take 1 tablet (10 mg total) by mouth daily.   fluticasone (FLONASE) 50 MCG/ACT nasal spray SMARTSIG:1-2 Spray(s) Both Nares Daily   hydrOXYzine (ATARAX) 25 MG tablet Take 10 mg by mouth every 8 (eight) hours as needed.   meloxicam (MOBIC) 15 MG tablet Take 15 mg by mouth daily as needed for pain.   methimazole (TAPAZOLE) 5 MG tablet Take 0.5 tablets (2.5 mg total) by mouth daily.   montelukast (SINGULAIR) 10 MG tablet Take 10 mg by mouth daily.   MYFEMBREE 40-1-0.5 MG TABS Take 1 tablet by  mouth once daily   ondansetron (ZOFRAN-ODT) 4 MG disintegrating tablet Take 1 tablet (4 mg total) by mouth every 8 (eight) hours as needed for nausea or vomiting.   potassium chloride SA (KLOR-CON M) 20 MEQ tablet Take 1 tablet (20 mEq total) by mouth daily.   prazosin (MINIPRESS) 5 MG capsule Take 1 capsule (5 mg total) by mouth at bedtime.   rizatriptan (MAXALT) 10 MG tablet Take 1 tablet by mouth daily as needed. At onset of headache   Vitamin D, Ergocalciferol, (DRISDOL) 1.25 MG (50000 UNIT) CAPS capsule Take 1 capsule by mouth once a week   [DISCONTINUED] LO LOESTRIN FE 1 MG-10 MCG / 10 MCG tablet Take 1 tablet by mouth daily. (Patient not taking: Reported on 10/13/2019)   [DISCONTINUED] zolpidem (AMBIEN CR) 12.5 MG CR tablet Take 1 tablet (12.5 mg total) by mouth at bedtime as needed for sleep. (Patient not taking: No sig reported)   No facility-administered encounter medications on file as of 04/30/2022.    ALLERGIES:  Allergies  Allergen Reactions   Antihistamines, Diphenhydramine-Type Anxiety    Patient reports she cannot tolerate diphenhydramine with symptoms of anxiety   Bee Venom Swelling and Other (See Comments)    Reaction:  Localized swelling    Hydrocodone Itching   Penicillins Itching and Other (See Comments)    Has patient had a PCN reaction causing immediate  rash, facial/tongue/throat swelling, SOB or lightheadedness with hypotension: No Has patient had a PCN reaction causing severe rash involving mucus membranes or skin necrosis: No Has patient had a PCN reaction that required hospitalization No Has patient had a PCN reaction occurring within the last 10 years: Yes If all of the above answers are "NO", then may proceed with Cephalosporin use.    Venofer [Iron Sucrose] Nausea And Vomiting and Other (See Comments)    Diaphoresis and vomiting   Reglan [Metoclopramide] Anxiety     PHYSICAL EXAM:   *** ECOG PERFORMANCE STATUS: 1 - Symptomatic but completely ambulatory  There were no vitals filed for this visit. There were no vitals filed for this visit. Physical Exam Constitutional:      Appearance: Normal appearance. She is obese.  HENT:     Head: Normocephalic and atraumatic.     Mouth/Throat:     Mouth: Mucous membranes are moist.  Eyes:     Extraocular Movements: Extraocular movements intact.     Pupils: Pupils are equal, round, and reactive to light.  Cardiovascular:     Rate and Rhythm: Normal rate and regular rhythm.     Pulses: Normal pulses.     Heart sounds: Normal heart sounds.  Pulmonary:     Effort: Pulmonary effort is normal.     Breath sounds: Normal breath sounds.  Abdominal:     General: Bowel sounds are normal.     Palpations: Abdomen is soft.     Tenderness: There is no abdominal tenderness.  Musculoskeletal:        General: No swelling.     Right lower leg: No edema.     Left lower leg: No edema.  Lymphadenopathy:     Cervical: No cervical adenopathy.  Skin:    General: Skin is warm and dry.  Neurological:     General: No focal deficit present.     Mental Status: She is alert and oriented to person, place, and time.  Psychiatric:        Mood and Affect: Mood normal.        Behavior:  Behavior normal.      LABORATORY DATA:  I have reviewed the labs as listed.  CBC    Component Value Date/Time    WBC 5.3 02/13/2022 2337   RBC 5.11 02/13/2022 2337   HGB 14.9 02/13/2022 2337   HGB 14.4 05/14/2021 1536   HCT 43.9 02/13/2022 2337   HCT 43.5 05/14/2021 1536   PLT 237 02/13/2022 2337   PLT 271 05/14/2021 1536   MCV 85.9 02/13/2022 2337   MCV 86 05/14/2021 1536   MCH 29.2 02/13/2022 2337   MCHC 33.9 02/13/2022 2337   RDW 12.2 02/13/2022 2337   RDW 12.1 05/14/2021 1536   LYMPHSABS 1.2 02/13/2022 2337   LYMPHSABS 1.4 05/14/2021 1536   MONOABS 0.4 02/13/2022 2337   EOSABS 0.1 02/13/2022 2337   EOSABS 0.1 05/14/2021 1536   BASOSABS 0.0 02/13/2022 2337   BASOSABS 0.0 05/14/2021 1536      Latest Ref Rng & Units 02/13/2022   11:37 PM 08/27/2021    4:58 AM 10/15/2019   12:00 AM  CMP  Glucose 70 - 99 mg/dL 99  914    BUN 6 - 20 mg/dL 11  10  7       Creatinine 0.44 - 1.00 mg/dL 7.82  9.56  0.7      Sodium 135 - 145 mmol/L 140  137    Potassium 3.5 - 5.1 mmol/L 3.3  3.4    Chloride 98 - 111 mmol/L 109  106    CO2 22 - 32 mmol/L 22  23    Calcium 8.9 - 10.3 mg/dL 9.4  9.0    Total Protein 6.5 - 8.1 g/dL  8.0    Total Bilirubin 0.3 - 1.2 mg/dL  0.6    Alkaline Phos 38 - 126 U/L  152    AST 15 - 41 U/L  61    ALT 0 - 44 U/L  70       This result is from an external source.     DIAGNOSTIC IMAGING:  I have independently reviewed the relevant imaging and discussed with the patient.  ASSESSMENT & PLAN: 1.  Microcytic anemia with iron deficiency - Longstanding history of anemia, most likely secondary to menorrhagia *** - EGD was 01/17/2020 which showed gastritis without clear signs of bleeding - Colonoscopy on 07/24/2020 showed nonbleeding internal hemorrhoids and diverticulosis, but was negative for signs of bleeding. - Capsule study in April 2022 was unremarkable - Other causes for anemia were negative (normal SPEP, haptoglobin, B12, folate, copper, LDH) - Failed oral iron therapy due to severe constipation and lack of improvement - Denies any GI blood loss within the past 6  months*** - Periods were better when taking Myfembree, but she has had difficulty obtaining medication.  Recurrent menorrhagia when not taking Myfembree.*** - Moderate fatigue with energy about ***%   - Most recent IV iron with Feraheme on 02/19/2022 and 02/27/2022.  Patient had REACTION TO VENOFER on 10/24/2020, with nausea, vomiting, diaphoresis.   - Most recent labs (obtained via [facility] on 04/29/2022): *** - PLAN: IV iron *** - We will repeat labs and office visit in 3 months   2.  Vitamin D deficiency - She has not been taking her vitamin D supplementation.  (Recommended vitamin D 2000 units OTC at last visit)  - Most recent vitamin D (01/17/2022): Low at 23.7 - Switch to vitamin D 50,000 units weekly by endocrinologist on 01/22/2022. - PLAN: Continue vitamin D dosing and monitoring per endocrinologist.   3.  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  No further work-up or treatment indicated.   4. Social and family history - Patient's father has cancer of unknown type.  He is also anemic, has received multiple blood transfusions over the past year. - Patient is a lifelong non-smoker.  Does not use drugs or alcohol    PLAN SUMMARY: >> *** >> *** >> ***PHONE???   All questions were answered. The patient knows to call the clinic with any problems, questions or concerns.  Medical decision making: ***  Time spent on visit: I spent *** minutes counseling the patient face to face. The total time spent in the appointment was *** minutes and more than 50% was on counseling.   Carnella Guadalajara, PA-C  ***

## 2022-04-30 ENCOUNTER — Inpatient Hospital Stay: Payer: Medicare HMO | Attending: Physician Assistant | Admitting: Physician Assistant

## 2022-04-30 DIAGNOSIS — E559 Vitamin D deficiency, unspecified: Secondary | ICD-10-CM | POA: Insufficient documentation

## 2022-04-30 DIAGNOSIS — D709 Neutropenia, unspecified: Secondary | ICD-10-CM | POA: Insufficient documentation

## 2022-04-30 DIAGNOSIS — Z79899 Other long term (current) drug therapy: Secondary | ICD-10-CM | POA: Insufficient documentation

## 2022-04-30 DIAGNOSIS — D509 Iron deficiency anemia, unspecified: Secondary | ICD-10-CM | POA: Insufficient documentation

## 2022-05-01 NOTE — Progress Notes (Unsigned)
[place] [place], [place] [address]   CLINIC:  Medical Oncology/Hematology  PCP:  Denyce Robert, [place] [address] [phone]   REASON FOR VISIT:  Follow-up for iron deficiency anemia  CURRENT THERAPY: Intermittent IV iron infusions  INTERVAL HISTORY:  Ms. Kruschke 36 y.o. female returns for routine follow-up of her iron deficiency anemia.  She was last seen by Tarri Abernethy PA-C on 01/24/2022.  She felt *** after her IV iron infusions on 02/19/2022 and 02/27/2022. At today's visit, she reports feeling fatigued.  *** She reports that she had been doing better after starting Myfembree, but she was unable to obtain her medication over the past several months due to financial constraints, which resulted in recurrent menorrhagia.  *** Since that time, she has had increasing fatigue and dizzy spells as well as recurrent night sweats. *** She denies any other source of blood loss such as melena, hematochezia, or epistaxis.  *** No pica, restless legs, chest pain, dyspnea.  *** No fevers, chills, or unintentional weight loss. ***  She reports ***% energy and ***% appetite.  She is maintaining a stable weight at this time.    REVIEW OF SYSTEMS:   *** Review of Systems  Constitutional:  Positive for fatigue. Negative for appetite change, chills, diaphoresis, fever and unexpected weight change.  HENT:   Negative for lump/mass and nosebleeds.   Eyes:  Negative for eye problems.  Respiratory:  Negative for cough, hemoptysis and shortness of breath.   Cardiovascular:  Negative for chest pain, leg swelling and palpitations.  Gastrointestinal:  Negative for abdominal pain, blood in stool, constipation, diarrhea, nausea and vomiting.  Endocrine: Positive for hot flashes (with night sweats).  Genitourinary:  Negative for hematuria.   Skin: Negative.   Neurological:  Positive for dizziness. Negative for headaches and  light-headedness.  Hematological:  Does not bruise/bleed easily.  Psychiatric/Behavioral:  Positive for depression and sleep disturbance. The patient is nervous/anxious.       PAST MEDICAL/SURGICAL HISTORY:  Past Medical History:  Diagnosis Date   Abnormal uterine bleeding (AUB) 12/12/2015   Anemia    Anxiety    Asthma    Constipation    Depression    Fibroid    Genital herpes    GERD (gastroesophageal reflux disease)    History of chlamydia    Hyperemesis    IBS (irritable bowel syndrome)    Insomnia    Mental disorder    PTSD, ANXIETY,Depression   PTSD (post-traumatic stress disorder)    Sinusitis    Thyroid disease    Vaginal dryness 12/12/2015   Past Surgical History:  Procedure Laterality Date   COLONOSCOPY     2016   COLONOSCOPY N/A 04/08/2016   Procedure: COLONOSCOPY;  Surgeon: Danie Binder, MD;  [facility];  Service: Endoscopy;  Laterality: N/A;  10:15 AM   COLONOSCOPY WITH PROPOFOL N/A 07/24/2020   Procedure: COLONOSCOPY WITH PROPOFOL;  Surgeon: [name], DO;  [facility];  Service: Endoscopy;  Laterality: N/A;  11:00am   ESOPHAGOGASTRODUODENOSCOPY N/A 04/08/2016   Procedure: ESOPHAGOGASTRODUODENOSCOPY (EGD);  Surgeon: Danie Binder, MD;  [facility];  Service: Endoscopy;  Laterality: N/A;   ESOPHAGOGASTRODUODENOSCOPY (EGD) WITH PROPOFOL N/A 01/17/2020   Procedure: ESOPHAGOGASTRODUODENOSCOPY (EGD) WITH PROPOFOL;  Surgeon: [name], DO;  [facility];  Service: Endoscopy;  Laterality: N/A;  8:15am   GIVENS CAPSULE STUDY N/A 09/28/2020  Procedure: GIVENS CAPSULE STUDY;  Surgeon: [name], DO;  [facility];  Service: Endoscopy;  Laterality: N/A;  7:30am   PERINEUM REPAIR     UPPER GASTROINTESTINAL ENDOSCOPY  2016     SOCIAL HISTORY:  Social History   Socioeconomic History   Marital status: Married    Spouse name: Not on file   Number of children: 3   Years of education:  Not on file   Highest education level: Not on file  Occupational History   Not on file  Tobacco Use   Smoking status: Former    Packs/day: 0.50    Years: 5.00    Total pack years: 2.50    Types: Cigarettes    Quit date: 06/03/2010    Years since quitting: 11.9   Smokeless tobacco: Never  Vaping Use   Vaping Use: Never used  Substance and Sexual Activity   Alcohol use: Not Currently    Alcohol/week: 2.0 standard drinks of alcohol    Types: 2 Glasses of wine per week    Comment: occ wine; denied 11/02/19   Drug use: Not Currently    Types: Marijuana    Comment: 11/02/19 states she used gummies 1-2 times a month.   Sexual activity: Yes    Birth control/protection: Surgical    Comment: vasectomy  Other Topics Concern   Not on file  Social History Narrative   ** Merged History Encounter **       Social Determinants of Health   Financial Resource Strain: Not on file  Food Insecurity: Not on file  Transportation Needs: Not on file  Physical Activity: Not on file  Stress: Not on file  Social Connections: Not on file  Intimate Partner Violence: Not on file    FAMILY HISTORY:  Family History  Problem Relation Age of Onset   Heart disease Mother    Hypertension Mother    Hyperlipidemia Mother    Heart attack Mother    Heart failure Mother    Hypertension Father    Hyperlipidemia Father    Diabetes Father    Cancer Father    Thyroid disease Father    Stroke Father    Epilepsy Brother    Eczema Daughter    Autism spectrum disorder Son    Colon cancer Neg Hx    Sleep apnea Neg Hx     CURRENT MEDICATIONS:  Outpatient Encounter Medications as of 05/02/2022  Medication Sig   acetaminophen (TYLENOL) 500 MG tablet Take 500 mg by mouth every 6 (six) hours as needed for mild pain or moderate pain.   albuterol (VENTOLIN HFA) 108 (90 Base) MCG/ACT inhaler Inhale 1 puff into the lungs every 6 (six) hours as needed for wheezing or shortness of breath.   buPROPion (WELLBUTRIN XL)  300 MG 24 hr tablet Take 1 tablet (300 mg total) by mouth daily.   dexlansoprazole (DEXILANT) 60 MG capsule Take 1 capsule (60 mg total) by mouth daily.   escitalopram (LEXAPRO) 10 MG tablet Take 1 tablet (10 mg total) by mouth daily.   fluticasone (FLONASE) 50 MCG/ACT nasal spray SMARTSIG:1-2 Spray(s) Both Nares Daily   hydrOXYzine (ATARAX) 25 MG tablet Take 10 mg by mouth every 8 (eight) hours as needed.   meloxicam (MOBIC) 15 MG tablet Take 15 mg by mouth daily as needed for pain.   methimazole (TAPAZOLE) 5 MG tablet Take 0.5 tablets (2.5 mg total) by mouth daily.   montelukast (SINGULAIR) 10 MG tablet Take 10 mg by  mouth daily.   MYFEMBREE 40-1-0.5 MG TABS Take 1 tablet by mouth once daily   ondansetron (ZOFRAN-ODT) 4 MG disintegrating tablet Take 1 tablet (4 mg total) by mouth every 8 (eight) hours as needed for nausea or vomiting.   potassium chloride SA (KLOR-CON M) 20 MEQ tablet Take 1 tablet (20 mEq total) by mouth daily.   prazosin (MINIPRESS) 5 MG capsule Take 1 capsule (5 mg total) by mouth at bedtime.   rizatriptan (MAXALT) 10 MG tablet Take 1 tablet by mouth daily as needed. At onset of headache   Vitamin D, Ergocalciferol, (DRISDOL) 1.25 MG (50000 UNIT) CAPS capsule Take 1 capsule by mouth once a week   [DISCONTINUED] LO LOESTRIN FE 1 MG-10 MCG / 10 MCG tablet Take 1 tablet by mouth daily. (Patient not taking: Reported on 10/13/2019)   [DISCONTINUED] zolpidem (AMBIEN CR) 12.5 MG CR tablet Take 1 tablet (12.5 mg total) by mouth at bedtime as needed for sleep. (Patient not taking: No sig reported)   No facility-administered encounter medications on file as of 05/02/2022.    ALLERGIES:  Allergies  Allergen Reactions   Antihistamines, Diphenhydramine-Type Anxiety    Patient reports she cannot tolerate diphenhydramine with symptoms of anxiety   Bee Venom Swelling and Other (See Comments)    Reaction:  Localized swelling    Hydrocodone Itching   Penicillins Itching and Other (See  Comments)    Has patient had a PCN reaction causing immediate rash, facial/tongue/throat swelling, SOB or lightheadedness with hypotension: No Has patient had a PCN reaction causing severe rash involving mucus membranes or skin necrosis: No Has patient had a PCN reaction that required hospitalization No Has patient had a PCN reaction occurring within the last 10 years: Yes If all of the above answers are "NO", then may proceed with Cephalosporin use.    Venofer [Iron Sucrose] Nausea And Vomiting and Other (See Comments)    Diaphoresis and vomiting   Reglan [Metoclopramide] Anxiety     PHYSICAL EXAM:   *** ECOG PERFORMANCE STATUS: 1 - Symptomatic but completely ambulatory  There were no vitals filed for this visit. There were no vitals filed for this visit. Physical Exam Constitutional:      Appearance: Normal appearance. She is obese.  HENT:     Head: Normocephalic and atraumatic.     Mouth/Throat:     Mouth: Mucous membranes are moist.  Eyes:     Extraocular Movements: Extraocular movements intact.     Pupils: Pupils are equal, round, and reactive to light.  Cardiovascular:     Rate and Rhythm: Normal rate and regular rhythm.     Pulses: Normal pulses.     Heart sounds: Normal heart sounds.  Pulmonary:     Effort: Pulmonary effort is normal.     Breath sounds: Normal breath sounds.  Abdominal:     General: Bowel sounds are normal.     Palpations: Abdomen is soft.     Tenderness: There is no abdominal tenderness.  Musculoskeletal:        General: No swelling.     Right lower leg: No edema.     Left lower leg: No edema.  Lymphadenopathy:     Cervical: No cervical adenopathy.  Skin:    General: Skin is warm and dry.  Neurological:     General: No focal deficit present.     Mental Status: She is alert and oriented to person, place, and time.  Psychiatric:        Mood  and Affect: Mood normal.        Behavior: Behavior normal.      LABORATORY DATA:  I have  reviewed the labs as listed.  CBC    Component Value Date/Time   WBC 5.3 02/13/2022 2337   RBC 5.11 02/13/2022 2337   HGB 14.9 02/13/2022 2337   HGB 14.4 05/14/2021 1536   HCT 43.9 02/13/2022 2337   HCT 43.5 05/14/2021 1536   PLT 237 02/13/2022 2337   PLT 271 05/14/2021 1536   MCV 85.9 02/13/2022 2337   MCV 86 05/14/2021 1536   MCH 29.2 02/13/2022 2337   MCHC 33.9 02/13/2022 2337   RDW 12.2 02/13/2022 2337   RDW 12.1 05/14/2021 1536   LYMPHSABS 1.2 02/13/2022 2337   LYMPHSABS 1.4 05/14/2021 1536   MONOABS 0.4 02/13/2022 2337   EOSABS 0.1 02/13/2022 2337   EOSABS 0.1 05/14/2021 1536   BASOSABS 0.0 02/13/2022 2337   BASOSABS 0.0 05/14/2021 1536      Latest Ref Rng & Units 02/13/2022   11:37 PM 08/27/2021    4:58 AM 10/15/2019   12:00 AM  CMP  Glucose 70 - 99 mg/dL 99  125    BUN 6 - 20 mg/dL '11  10  7      '$ Creatinine 0.44 - 1.00 mg/dL 0.69  0.69  0.7      Sodium 135 - 145 mmol/L 140  137    Potassium 3.5 - 5.1 mmol/L 3.3  3.4    Chloride 98 - 111 mmol/L 109  106    CO2 22 - 32 mmol/L 22  23    Calcium 8.9 - 10.3 mg/dL 9.4  9.0    Total Protein 6.5 - 8.1 g/dL  8.0    Total Bilirubin 0.3 - 1.2 mg/dL  0.6    Alkaline Phos 38 - 126 U/L  152    AST 15 - 41 U/L  61    ALT 0 - 44 U/L  70       This result is from an external source.     DIAGNOSTIC IMAGING:  I have independently reviewed the relevant imaging and discussed with the patient.  ASSESSMENT & PLAN: 1.  Microcytic anemia with iron deficiency - Longstanding history of anemia, most likely secondary to menorrhagia *** - EGD was 01/17/2020 which showed gastritis without clear signs of bleeding - Colonoscopy on 07/24/2020 showed nonbleeding internal hemorrhoids and diverticulosis, but was negative for signs of bleeding. - Capsule study in April 2022 was unremarkable - Other causes for anemia were negative (normal SPEP, haptoglobin, B12, folate, copper, LDH) - Failed oral iron therapy due to severe constipation and  lack of improvement - Denies any GI blood loss within the past 6 months*** - Periods were better when taking Myfembree, but she has had difficulty obtaining medication.  Recurrent menorrhagia when not taking Myfembree.*** - Moderate fatigue with energy about ***%   - Most recent IV iron with Feraheme on 02/19/2022 and 02/27/2022.  Patient had REACTION TO VENOFER on 10/24/2020, with nausea, vomiting, diaphoresis.   - Most recent labs (obtained via [facility] on 04/29/2022): Hgb 14.9/MCV 85, ferritin 230, iron saturation 38%. - PLAN: No indication for IV iron at this time - We will repeat labs and phone visit in 3 months   2.  Vitamin D deficiency - She has not been taking her vitamin D supplementation.  (Recommended vitamin D 2000 units OTC at last visit)  - Switched to vitamin D 50,000 units weekly by endocrinologist  on 01/22/2022. - Most recent vitamin D (04/29/2022): 43.8 - PLAN: Continue vitamin D dosing and monitoring per endocrinologist.   3.  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  No further work-up or treatment indicated.   4. Social and family history - Patient's father has cancer of unknown type.  He is also anemic, has received multiple blood transfusions over the past year. - Patient is a lifelong non-smoker.  Does not use drugs or alcohol    PLAN SUMMARY: >> Labs in 3 months (CBC/D, ferritin, iron/TIBC)***[facility]?? >> PHONE visit after labs ***PHONE???  All questions were answered. The patient knows to call the clinic with any problems, questions or concerns.  Medical decision making:***  Time spent on visit: I spent *** minutes counseling the patient face to face. The total time spent in the appointment was *** minutes and more than 50% was on  counseling.   Harriett Rush, PA-C  ***

## 2022-05-02 ENCOUNTER — Other Ambulatory Visit: Payer: Self-pay

## 2022-05-02 ENCOUNTER — Inpatient Hospital Stay (HOSPITAL_BASED_OUTPATIENT_CLINIC_OR_DEPARTMENT_OTHER): Payer: Medicare HMO | Admitting: Physician Assistant

## 2022-05-02 VITALS — BP 119/69 | HR 78 | Temp 98.6°F | Resp 16 | Ht 62.0 in | Wt 177.1 lb

## 2022-05-02 DIAGNOSIS — R110 Nausea: Secondary | ICD-10-CM | POA: Diagnosis not present

## 2022-05-02 DIAGNOSIS — Z79899 Other long term (current) drug therapy: Secondary | ICD-10-CM | POA: Diagnosis not present

## 2022-05-02 DIAGNOSIS — D500 Iron deficiency anemia secondary to blood loss (chronic): Secondary | ICD-10-CM | POA: Diagnosis not present

## 2022-05-02 DIAGNOSIS — D709 Neutropenia, unspecified: Secondary | ICD-10-CM | POA: Diagnosis not present

## 2022-05-02 DIAGNOSIS — D509 Iron deficiency anemia, unspecified: Secondary | ICD-10-CM | POA: Diagnosis not present

## 2022-05-02 DIAGNOSIS — E559 Vitamin D deficiency, unspecified: Secondary | ICD-10-CM

## 2022-05-02 NOTE — Patient Instructions (Signed)
[facility] **   You were seen today by Tarri Abernethy PA-C for your iron deficiency anemia and B12 deficiency.    IRON DEFICIENCY ANEMIA: Your blood and iron levels look great!  You do not need any IV iron at this time.    VITAMIN D DEFICIENCY Continue taking vitamin D 50,000 units (1.25 mcg vitamin D2) once a week, as prescribed by endocrinologist.  LABS: Return in 3 months for repeat labs (at [facility])  FOLLOW-UP APPOINTMENT: Phone visit in 3 months, after labs  ** Thank you for trusting me with your healthcare!  I strive to provide all of my patients with quality care at each visit.  If you receive a survey for this visit, I would be so grateful to you for taking the time to provide feedback.  Thank you in advance!  ~ Lizbet Cirrincione                   Dr. Derek Jack   &   Tarri Abernethy, PA-C   - - - - - - - - - - - - - - - - - -    Thank you for choosing [facility] at [facility] to provide your oncology and hematology care.  To afford each patient quality time with our provider, please arrive at least 15 minutes before your scheduled appointment time.   If you have a lab appointment with the [facility] please come in thru the Main Entrance and check in at the main information desk.  You need to re-schedule your appointment should you arrive 10 or more minutes late.  We strive to give you quality time with our providers, and arriving late affects you and other patients whose appointments are after yours.  Also, if you no show three or more times for appointments you may be dismissed from the clinic at the providers discretion.     Again, thank you for choosing [facility].  Our hope is that these requests will decrease the amount of time that you wait before being seen by our physicians.        _____________________________________________________________  Should you have questions after your visit to [facility], please contact our office at [phone] and follow the prompts.  Our office hours are 8:00 a.m. and 4:30 p.m. Monday - Friday.  Please note that voicemails left after 4:00 p.m. may not be returned until the following business day.  We are closed weekends and major holidays.  You do have access to a nurse 24-7, just call the main number to the clinic [phone] and do not press any options, hold on the line and a nurse will answer the phone.    For prescription refill requests, have your pharmacy contact our office and allow 72 hours.

## 2022-05-08 ENCOUNTER — Encounter: Payer: Self-pay | Admitting: Hematology

## 2022-05-17 LAB — T4, FREE: Free T4: 1.13 ng/dL (ref 0.82–1.77)

## 2022-05-17 LAB — TSH: TSH: 0.407 u[IU]/mL — ABNORMAL LOW (ref 0.450–4.500)

## 2022-05-17 LAB — VITAMIN D 25 HYDROXY (VIT D DEFICIENCY, FRACTURES): Vit D, 25-Hydroxy: 35.8 ng/mL (ref 30.0–100.0)

## 2022-05-23 ENCOUNTER — Encounter: Payer: Self-pay | Admitting: Hematology

## 2022-05-24 ENCOUNTER — Encounter: Payer: Medicare HMO | Admitting: "Endocrinology

## 2022-05-24 NOTE — Progress Notes (Signed)
This encounter was created in error - please disregard.

## 2022-05-24 NOTE — Progress Notes (Signed)
Pt states she is experiencing dizziness with any motion also experiencing an increase in her appetite.

## 2022-06-25 NOTE — Congregational Nurse Program (Signed)
Patient presented to onsite [employer] food market for return visit for family of 5 and shopped for 31.55 lbs of food

## 2022-06-28 ENCOUNTER — Other Ambulatory Visit: Payer: Self-pay | Admitting: "Endocrinology

## 2022-07-03 NOTE — Congregational Nurse Program (Signed)
participant  has participated in two (2) sessions of the "MED instead of Meds) phytoRx nutrition classes (06/26/22 and 07/03/22)  Focus of the session 1 was introduction of what the program is and providing a recipe (black bean salsa) as example of mediterrean diet, with a focus in session 2 with l focus of learning how to "change out of proteins"  Participant did sample the recipes and stated that she would be willing to utilize for herself and the family  Participant did shop at food market on today as a result of attending classes during the week of both sessions in addition to begin implementing the practice of  making healthier choices when shopping and considering the mediterrean way of selecting foods   Pt plans on participating in the session 3 of 7 (swapping fats) on Wed (Feb 7) to continue her nutrition education

## 2022-07-10 NOTE — Congregational Nurse Program (Signed)
participant attended the 3rd class of Med instead of Meds phytoRx class with focus on "swapping fats" and sampling a recipe (spicy roasted cauliflower) as example of meditterrean way diet. Recipes were provided by faciliitator of [place] location  Participant did sample recipe and did state this would be a recipe she would be willing to try for her family and herself  Participant did shop on 2.6.24 at food market on  this week as a result of agreeing to implement the practice of making healthier choices when shopping and considering the mediterrean way of selecting foods.   She conitinues to utilize her phytorx prescription provided during the 1st session of phytorx  Participant states she has a diagnosis of hyperthyroidism and is needing these classes as a result of balancing her diet/treating her disease to help improve it.    Participant plans on attending the next phytorx class on 2.14.24

## 2022-07-17 NOTE — Congregational Nurse Program (Signed)
participant attended the 4th class of Med instead of Meds phytoRx class with focus on "Eating More Fruits and Vegetables" and sampling a recipe (Fruit Chopt Salad) as example of meditterrean way diet. Recipes were provided by faciliitator of [place] location  Participant did sample recipe and did state this would be a recipe she would be willing to try for her family and herself at home  Participant did shop on one day before the phyto rx class at our onsite food market here at [employer].  She does implement the practice of making healthier choices when shopping and cooking at home by utilizing the mediterrean suggested diet.  She continues to utilize her phytorx prescription provided during the 1st session of phytorx   Participant plans on attending the next phytorx class (Session 5) on  (Wednesday) 2.21.24

## 2022-07-18 ENCOUNTER — Encounter: Payer: Self-pay | Admitting: "Endocrinology

## 2022-07-19 ENCOUNTER — Other Ambulatory Visit: Payer: Self-pay | Admitting: "Endocrinology

## 2022-07-19 MED ORDER — METHIMAZOLE 5 MG PO TABS: 2.5000 mg | ORAL_TABLET | Freq: Every day | ORAL | 0 refills | Status: DC

## 2022-07-24 NOTE — Congregational Nurse Program (Signed)
Participant attended session 5 of Eating the Med Way instead of Meds phytoRx class with the focus "make your whole grains" within the diet.  Participant sampled recipe (chocolate overnight oats) that the facilitator created and related to the class topic.  She states it was very good and that she would be willing to try the recipe at home for herself and family.   Participant shopped at the onsite food market on today and does continue to implement the practice of making healthier choices at her home and when shopping in the grocery store for selected foods   Plan  Pt shopped in food market at [employer] for family of 5 as return visit  [place] does plan on attending the next phytorx session 6 on (wed) Jul 31, 2022

## 2022-07-29 ENCOUNTER — Other Ambulatory Visit: Payer: Self-pay | Admitting: *Deleted

## 2022-07-29 DIAGNOSIS — E559 Vitamin D deficiency, unspecified: Secondary | ICD-10-CM

## 2022-07-29 DIAGNOSIS — D500 Iron deficiency anemia secondary to blood loss (chronic): Secondary | ICD-10-CM

## 2022-07-31 LAB — GLUCOSE, POCT (MANUAL RESULT ENTRY): POC Glucose: 97 mg/dl (ref 70–99)

## 2022-08-01 ENCOUNTER — Inpatient Hospital Stay: Payer: Medicare HMO | Attending: Physician Assistant | Admitting: Physician Assistant

## 2022-08-01 DIAGNOSIS — D500 Iron deficiency anemia secondary to blood loss (chronic): Secondary | ICD-10-CM

## 2022-08-01 NOTE — Progress Notes (Signed)
[facility] [place], [place] [address]   CLINIC:  Medical Oncology/Hematology  PCP:  Denyce Robert, [address] [address] [phone]  I connected with Charlotte Mccormick  on 08/01/22 by telephone visit and verified that I am speaking with the correct person using two identifiers.   I discussed the limitations, risks, security and privacy concerns of performing an evaluation and management service by telemedicine and the availability of in-person appointments. I also discussed with the patient that there may be a patient responsible charge related to this service. The patient expressed understanding and agreed to proceed.  Patient's location: Home Provider's location: Office  REASON FOR VISIT:  Follow-up for iron deficiency anemia  CURRENT THERAPY: Intermittent IV iron infusions  INTERVAL HISTORY:  Charlotte Mccormick 37 y.o. female returns for routine follow-up of her iron deficiency anemia.  She was last seen by Tarri Abernethy PA-C on 05/02/2022.  Charlotte Mccormick reports that she still has some fatigue but it is unchanged from the last visit. She is able to complete her ADLs on her own. She reports that she stopped taking Myfembree in January 2024 due to high cost of medication. As a result, her menstrual cycle increased lasting 7 days with 3-4 days of heavy bleeding (as compared to 4 days each cycle with 3 days of heavy bleeding). She is reports her appetite and weight are unchanged. She denies nausea, vomiting or abdominal pain. Her bowel habits are unchanged without recurrent episodes of diarrhea or constipation. She denies any other signs of bleeding including hematochezia or melena. She denies fevers, chills, sweats, shortness of breath, chest pain, cough, headaches or dizziness. She has no other complaints.    REVIEW OF SYSTEMS:    Review of Systems  Constitutional:  Positive for fatigue. Negative for  appetite change, chills, diaphoresis, fever and unexpected weight change.  HENT:   Negative for lump/mass and nosebleeds.   Eyes:  Negative for eye problems.  Respiratory:  Negative for cough, hemoptysis and shortness of breath.   Cardiovascular:  Negative for chest pain, leg swelling and palpitations.  Gastrointestinal:  Negative for abdominal pain, blood in stool, constipation, diarrhea, nausea and vomiting.  Genitourinary:  Positive for menstrual problem. Negative for hematuria.   Skin: Negative.   Neurological:  Negative for light-headedness.  Hematological:  Does not bruise/bleed easily.  All other systems reviewed and are negative.     PAST MEDICAL/SURGICAL HISTORY:  Past Medical History:  Diagnosis Date   Abnormal uterine bleeding (AUB) 12/12/2015   Anemia    Anxiety    Asthma    Constipation    Depression    Fibroid    Genital herpes    GERD (gastroesophageal reflux disease)    History of chlamydia    Hyperemesis    IBS (irritable bowel syndrome)    Insomnia    Mental disorder    PTSD, ANXIETY,Depression   PTSD (post-traumatic stress disorder)    Sinusitis    Thyroid disease    Vaginal dryness 12/12/2015   Past Surgical History:  Procedure Laterality Date   COLONOSCOPY     2016   COLONOSCOPY N/A 04/08/2016   Procedure: COLONOSCOPY;  Surgeon: Danie Binder, MD;  [facility]: [facility];  Service: Endoscopy;  Laterality: N/A;  10:15 AM   COLONOSCOPY WITH PROPOFOL N/A 07/24/2020   Procedure: COLONOSCOPY WITH PROPOFOL;  Surgeon: [name], DO;  Location: [facility];  Service: Endoscopy;  Laterality: N/A;  11:00am   ESOPHAGOGASTRODUODENOSCOPY N/A 04/08/2016   Procedure: ESOPHAGOGASTRODUODENOSCOPY (EGD);  Surgeon: Danie Binder, MD;  [facility]: [facility];  Service: Endoscopy;  Laterality: N/A;   ESOPHAGOGASTRODUODENOSCOPY (EGD) WITH PROPOFOL N/A 01/17/2020   Procedure: ESOPHAGOGASTRODUODENOSCOPY (EGD) WITH PROPOFOL;  Surgeon: [name], DO;   [facility]: [facility];  Service: Endoscopy;  Laterality: N/A;  8:15am   GIVENS CAPSULE STUDY N/A 09/28/2020   Procedure: GIVENS CAPSULE STUDY;  Surgeon: [name], DO;  [facility]: [facility];  Service: Endoscopy;  Laterality: N/A;  7:30am   PERINEUM REPAIR     UPPER GASTROINTESTINAL ENDOSCOPY  2016     SOCIAL HISTORY:  Social History   Socioeconomic History   Marital status: Married    Spouse name: Not on file   Number of children: 3   Years of education: Not on file   Highest education level: Not on file  Occupational History   Not on file  Tobacco Use   Smoking status: Former    Packs/day: 0.50    Years: 5.00    Total pack years: 2.50    Types: Cigarettes    Quit date: 06/03/2010    Years since quitting: 12.1   Smokeless tobacco: Never  Vaping Use   Vaping Use: Never used  Substance and Sexual Activity   Alcohol use: Not Currently    Alcohol/week: 2.0 standard drinks of alcohol    Types: 2 Glasses of wine per week    Comment: occ wine; denied 11/02/19   Drug use: Not Currently    Types: Marijuana    Comment: 11/02/19 states she used gummies 1-2 times a month.   Sexual activity: Yes    Birth control/protection: Surgical    Comment: vasectomy  Other Topics Concern   Not on file  Social History Narrative   ** Merged History Encounter **       Social Determinants of Health   Financial Resource Strain: Not on file  Food Insecurity: Not on file  Transportation Needs: Not on file  Physical Activity: Not on file  Stress: Not on file  Social Connections: Not on file  Intimate Partner Violence: Not on file    FAMILY HISTORY:  Family History  Problem Relation Age of Onset   Heart disease Mother    Hypertension Mother    Hyperlipidemia Mother    Heart attack Mother    Heart failure Mother    Hypertension Father    Hyperlipidemia Father    Diabetes Father    Cancer Father    Thyroid disease Father    Stroke Father    Epilepsy Brother    Eczema  Daughter    Autism spectrum disorder Son    Colon cancer Neg Hx    Sleep apnea Neg Hx     CURRENT MEDICATIONS:  Outpatient Encounter Medications as of 08/01/2022  Medication Sig   acetaminophen (TYLENOL) 500 MG tablet Take 500 mg by mouth every 6 (six) hours as needed for mild pain or moderate pain.   albuterol (VENTOLIN HFA) 108 (90 Base) MCG/ACT inhaler Inhale 1 puff into the lungs every 6 (six) hours as needed for wheezing or shortness of breath.   dexlansoprazole (DEXILANT) 60 MG capsule Take 1 capsule (60 mg total) by mouth daily. (Patient not taking: Reported on 05/24/2022)   escitalopram (LEXAPRO) 10 MG tablet Take 1 tablet (10 mg total) by mouth daily.   hydrOXYzine (ATARAX) 10 MG tablet 1 tablet as needed Orally Up to three times daily  as needed for anxiety   LEXAPRO 5 MG tablet 1 tablet Orally Once a day for 30 days   meloxicam (MOBIC) 7.5 MG tablet Take 7.5 mg by mouth daily.   methimazole (TAPAZOLE) 5 MG tablet Take 0.5 tablets (2.5 mg total) by mouth daily.   montelukast (SINGULAIR) 10 MG tablet Take 10 mg by mouth daily.   MYFEMBREE 40-1-0.5 MG TABS Take 1 tablet by mouth once daily   ondansetron (ZOFRAN-ODT) 4 MG disintegrating tablet Take 1 tablet (4 mg total) by mouth every 8 (eight) hours as needed for nausea or vomiting.   potassium chloride SA (KLOR-CON M) 20 MEQ tablet Take 1 tablet (20 mEq total) by mouth daily. (Patient not taking: Reported on 05/24/2022)   prazosin (MINIPRESS) 5 MG capsule Take 1 capsule (5 mg total) by mouth at bedtime.   rizatriptan (MAXALT) 10 MG tablet Take 1 tablet by mouth daily as needed. At onset of headache   Vitamin D, Ergocalciferol, (DRISDOL) 1.25 MG (50000 UNIT) CAPS capsule Take 1 capsule by mouth once a week   WELLBUTRIN XL 300 MG 24 hr tablet Take 300 mg by mouth daily.   [DISCONTINUED] LO LOESTRIN FE 1 MG-10 MCG / 10 MCG tablet Take 1 tablet by mouth daily. (Patient not taking: Reported on 10/13/2019)   [DISCONTINUED] zolpidem (AMBIEN  CR) 12.5 MG CR tablet Take 1 tablet (12.5 mg total) by mouth at bedtime as needed for sleep. (Patient not taking: No sig reported)   No facility-administered encounter medications on file as of 08/01/2022.    ALLERGIES:  Allergies  Allergen Reactions   Antihistamines, Diphenhydramine-Type Anxiety    Patient reports she cannot tolerate diphenhydramine with symptoms of anxiety   Bee Venom Swelling and Other (See Comments)    Reaction:  Localized swelling    Hydrocodone Itching   Penicillins Itching and Other (See Comments)    Has patient had a PCN reaction causing immediate rash, facial/tongue/throat swelling, SOB or lightheadedness with hypotension: No Has patient had a PCN reaction causing severe rash involving mucus membranes or skin necrosis: No Has patient had a PCN reaction that required hospitalization No Has patient had a PCN reaction occurring within the last 10 years: Yes If all of the above answers are "NO", then may proceed with Cephalosporin use.    Venofer [Iron Sucrose] Nausea And Vomiting and Other (See Comments)    Diaphoresis and vomiting   Reglan [Metoclopramide] Anxiety     PHYSICAL EXAM:    Not performed due to virtual visit.    LABORATORY DATA:  I have reviewed the labs as listed.     ASSESSMENT & PLAN: 1.  Microcytic anemia with iron deficiency - Longstanding history of anemia, most likely secondary to menorrhagia  - EGD was 01/17/2020 which showed gastritis without clear signs of bleeding - Colonoscopy on 07/24/2020 showed nonbleeding internal hemorrhoids and diverticulosis, but was negative for signs of bleeding. - Capsule study in April 2022 was unremarkable - Other causes for anemia were negative (normal SPEP, haptoglobin, B12, folate, copper, LDH) - Failed oral iron therapy due to severe constipation and lack of improvement - Denies any GI blood loss within the past 6 months - Periods have been lighter after taking Myfembree. Patient stopped taking  medication in January 2024 due to high cost. Advised to follow up with her OB/GYN, Dr. Elonda Husky for further evaluation. I sent a message to Dr. Elonda Husky.  - Most recent IV iron with Feraheme on 02/19/2022 and 02/27/2022.  Patient had REACTION TO  VENOFER on 10/24/2020, with nausea, vomiting, diaphoresis.   - Most recent labs (obtained via [facility] on 07/29/2022): Hgb 14.4, MCV 84, ferritin 53, iron saturation 23%, TIBC 304. - PLAN: No indication for IV iron at this time - We will repeat labs and phone visit in 3 months   2.  Vitamin D deficiency - She has not been taking her vitamin D supplementation.  (Recommended vitamin D 2000 units OTC at last visit)  - Switched to vitamin D 50,000 units weekly by endocrinologist on 01/22/2022. - Most recent vitamin D (05/16/2022): 35.8 - PLAN: Continue vitamin D dosing and monitoring per endocrinologist.   3.  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 - PLAN: No concerning symptoms at this time.  No further work-up or treatment indicated.   4. Social and family history - Patient's father has cancer of unknown type.  He is also anemic, has received multiple blood transfusions over the past year. - Patient is a lifelong non-smoker.  Does not use drugs or alcohol    PLAN SUMMARY:  >> Labs in 3 months (CBC/D, ferritin, iron/TIBC) via [facility]  >> PHONE visit after labs   All questions were answered. The patient knows to call the clinic with any problems, questions or concerns.  I have spent a total of 25 minutes of non-face-to-face time, preparing to see the patient, counseling and educating the patient, ordering tests/procedures, documenting clinical information in the electronic health record, independently interpreting  results and communicating results to the patient, and care coordination.   Dede Query PA-C Dept of Hematology and [place]  Phone: [phone]

## 2022-08-02 ENCOUNTER — Telehealth: Payer: Medicare HMO | Admitting: Physician Assistant

## 2022-08-07 DIAGNOSIS — Z139 Encounter for screening, unspecified: Secondary | ICD-10-CM

## 2022-08-07 NOTE — Congregational Nurse Program (Signed)
Participant attended final session 6 of Eating the Med Way instead of Meds phytoRx class with the focus "re-thinking your sweets" within the diet.  Participant sampled recipe for a healthy sweet substitute snack (microwaveble baked apple) that the facilitator created and related to the class topic.  He states it was very good and felt that he would be willing to try the recipe at home for himself and the family.   Participant continues to shop at the onsite food market on a weekly basis and does continue to implement the practice of  making healthier choices at his home and when shopping in the grocery store for selected foods   She received a certificate of completion and a healthy incentive item to assist with continuing to make healthier food choices in the future  Participant participated in the nurse hosted health screenings for her blood pressure and blood glucose.  In addition received education and counseling regarding the screening results.  BS (nonfasting) =97 and BP were both WNL=  (see vitals in fhases and Epic)  Pt was advised to maintain healthy eating habits as she stated has been doing and learned since being in the class and continuing these wellness screenings annual and or as prescribed by her PCP  Pt stated she understood the instructions and states he will attempt to improve

## 2022-09-19 ENCOUNTER — Other Ambulatory Visit: Payer: Self-pay | Admitting: "Endocrinology

## 2022-10-31 ENCOUNTER — Inpatient Hospital Stay: Payer: Medicare HMO | Admitting: Physician Assistant

## 2022-11-26 ENCOUNTER — Encounter: Payer: Self-pay | Admitting: Physician Assistant

## 2022-12-03 ENCOUNTER — Encounter: Payer: Self-pay | Admitting: Hematology

## 2023-03-11 ENCOUNTER — Other Ambulatory Visit: Payer: Self-pay | Admitting: Obstetrics & Gynecology

## 2023-03-28 ENCOUNTER — Encounter: Payer: Self-pay | Admitting: Hematology

## 2023-06-18 NOTE — Congregational Nurse Program (Signed)
Pt requested blood pressure check due to noticing it was elevated when using her self home monitoring bp equipment.  She states she also wants to make sure that her new bp monitor was measuring her bp correctly in comparison to our clinic bp monitor.   Plan - Conducted a BP check both being ABN values   (see vital signs section for results)   121/86 and 126/90  - Educate patient on better understanding/recognizing high     blood pressure values and stress management.  - Provided pt with blood pressure logs for her to monitor her     blood pressure twice a day (am and pm)   -Provided training on proper ways to measure her blood pressure  Pt was grateful for the information received and left the clinic

## 2023-06-19 ENCOUNTER — Encounter: Payer: Self-pay | Admitting: Hematology

## 2023-06-20 ENCOUNTER — Encounter: Payer: Self-pay | Admitting: Physician Assistant

## 2023-06-25 ENCOUNTER — Telehealth: Payer: Self-pay | Admitting: Physician Assistant

## 2023-06-25 NOTE — Telephone Encounter (Signed)
Scheduled appointment per scheduling message. Patient is aware of the made appointments and will be mailed an appointment reminder.

## 2023-06-26 ENCOUNTER — Encounter: Payer: Self-pay | Admitting: Internal Medicine

## 2023-06-26 ENCOUNTER — Ambulatory Visit: Payer: Medicare HMO | Admitting: Internal Medicine

## 2023-06-26 VITALS — BP 120/89 | HR 93 | Temp 97.8°F | Ht 62.0 in | Wt 190.4 lb

## 2023-06-26 DIAGNOSIS — K219 Gastro-esophageal reflux disease without esophagitis: Secondary | ICD-10-CM | POA: Diagnosis not present

## 2023-06-26 DIAGNOSIS — K5904 Chronic idiopathic constipation: Secondary | ICD-10-CM

## 2023-06-26 DIAGNOSIS — K5909 Other constipation: Secondary | ICD-10-CM | POA: Diagnosis not present

## 2023-06-26 DIAGNOSIS — D500 Iron deficiency anemia secondary to blood loss (chronic): Secondary | ICD-10-CM

## 2023-06-26 DIAGNOSIS — D509 Iron deficiency anemia, unspecified: Secondary | ICD-10-CM | POA: Diagnosis not present

## 2023-06-26 MED ORDER — LINACLOTIDE 72 MCG PO CAPS: 72.0000 ug | ORAL_CAPSULE | Freq: Every day | ORAL | 3 refills | Status: DC

## 2023-06-26 MED ORDER — DEXLANSOPRAZOLE 60 MG PO CPDR: 60.0000 mg | DELAYED_RELEASE_CAPSULE | Freq: Every day | ORAL | 3 refills | Status: DC

## 2023-06-26 NOTE — Patient Instructions (Signed)
For your chronic acid reflux, I will send in prescription for Dexilant 60 mg daily.  Let us know if this is not covered.  For your constipation I will restart you on Linzess 72 mcg daily.  Linzess works best when taken once a day every day, on an empty stomach, at least 30 minutes before your first meal of the day.  When Linzess is taken daily as directed:  *Constipation relief is typically felt in about a week *IBS-C patients may begin to experience relief from belly pain and overall abdominal symptoms (pain, discomfort, and bloating) in about 1 week,   with symptoms typically improving over 12 weeks.  Diarrhea may occur in the first 2 weeks -keep taking it.  The diarrhea should go away and you should start having normal, complete, full bowel movements. It may be helpful to start treatment when you can be near the comfort of your own bathroom, such as a weekend.   You will be due for screening colonoscopy at age 38.  Follow-up in 3 to 4 months.  It was very nice seeing you again today.  Dr. Marletta Lor

## 2023-06-26 NOTE — Progress Notes (Signed)
Referring Provider: Marylynn Pearson, FNP Primary Care Physician:  Marylynn Pearson, FNP Primary GI:  Dr. Marletta Lor  Chief Complaint  Patient presents with   Follow-up    Follow up on acid reflux and constipation. Time for colonoscopy    HPI:   Charlotte Mccormick is a 38 y.o. female who presents to clinic today for follow-up visit.  Has not been seen in our office since February 2022.  Iron deficiency anemia: Follows with hematology, felt to be due to menorrhagia.  Previous GI workup includes:  EGD 01/17/2020 gastritis status, normal duodenum, mild Schatzki's ring.  Pathology mild nonspecific reactive gastropathy and gastric oxyntic mucosa with no specific histopathological changes.   Colonoscopy 07/24/2020 showed nonbleeding internal hemorrhoids and diverticulosis, but was negative for signs of bleeding.  Capsule endoscopy 09/2020 unremarkable  Chronic constipation: Previously well-controlled on Linzess 72 mcg daily.  Trialed and failed MiraLAX in the past.  States she will go 1 to 2 weeks without having a bowel movement.  Notes incomplete evacuation as well as straining and uncomfortable bowel movements.  No melena hematochezia.  Chronic GERD: Not well controlled. Previously on pantoprazole, omeprazole, pepcid without relief. Was changed to Dexilant and this helped though her insurance stopped covering it. Now taking omeprazole 20 mg OTC without relief.  Notes breakthrough symptoms daily.  States she vomits up acid at times.  No dysphagia odynophagia.  Past Medical History:  Diagnosis Date   Abnormal uterine bleeding (AUB) 12/12/2015   Anemia    Anxiety    Asthma    Constipation    Depression    Fibroid    Genital herpes    GERD (gastroesophageal reflux disease)    History of chlamydia    Hyperemesis    IBS (irritable bowel syndrome)    Insomnia    Mental disorder    PTSD, ANXIETY,Depression   PTSD (post-traumatic stress disorder)    Sinusitis    Thyroid disease     Vaginal dryness 12/12/2015    Past Surgical History:  Procedure Laterality Date   COLONOSCOPY     2016   COLONOSCOPY N/A 04/08/2016   Procedure: COLONOSCOPY;  Surgeon: West Bali, MD;  [facility];  Service: Endoscopy;  Laterality: N/A;  10:15 AM   COLONOSCOPY WITH PROPOFOL N/A 07/24/2020   Procedure: COLONOSCOPY WITH PROPOFOL;  Surgeon: Lanelle Bal, DO;  [facility];  Service: Endoscopy;  Laterality: N/A;  11:00am   ESOPHAGOGASTRODUODENOSCOPY N/A 04/08/2016   Procedure: ESOPHAGOGASTRODUODENOSCOPY (EGD);  Surgeon: West Bali, MD;  [facility];  Service: Endoscopy;  Laterality: N/A;   ESOPHAGOGASTRODUODENOSCOPY (EGD) WITH PROPOFOL N/A 01/17/2020   Procedure: ESOPHAGOGASTRODUODENOSCOPY (EGD) WITH PROPOFOL;  Surgeon: Lanelle Bal, DO;  [facility];  Service: Endoscopy;  Laterality: N/A;  8:15am   GIVENS CAPSULE STUDY N/A 09/28/2020   Procedure: GIVENS CAPSULE STUDY;  Surgeon: Lanelle Bal, DO;  [facility];  Service: Endoscopy;  Laterality: N/A;  7:30am   PERINEUM REPAIR     UPPER GASTROINTESTINAL ENDOSCOPY  2016    Current Outpatient Medications  Medication Sig Dispense Refill   acetaminophen (TYLENOL) 500 MG tablet Take 500 mg by mouth every 6 (six) hours as needed for mild pain or moderate pain.     albuterol (VENTOLIN HFA) 108 (90 Base) MCG/ACT inhaler Inhale 1 puff into the lungs every 6 (six) hours as needed for wheezing or shortness of breath.     busPIRone (BUSPAR) 10 MG tablet Take  10 mg by mouth 3 (three) times daily.     LEXAPRO 5 MG tablet 1 tablet Orally Once a day for 30 days     meloxicam (MOBIC) 7.5 MG tablet Take 7.5 mg by mouth daily.     montelukast (SINGULAIR) 10 MG tablet Take 10 mg by mouth daily.     Relugolix-Estradiol-Norethind (MYFEMBREE) 40-1-0.5 MG TABS Take 1 tablet by mouth once daily 30 tablet 4   rizatriptan (MAXALT) 10 MG tablet Take 1 tablet by mouth daily as needed. At onset of  headache     WELLBUTRIN XL 300 MG 24 hr tablet Take 300 mg by mouth daily.     dexlansoprazole (DEXILANT) 60 MG capsule Take 1 capsule (60 mg total) by mouth daily. (Patient not taking: Reported on 05/24/2022) 90 capsule 3   escitalopram (LEXAPRO) 10 MG tablet Take 1 tablet (10 mg total) by mouth daily. 30 tablet 2   hydrOXYzine (ATARAX) 10 MG tablet 1 tablet as needed Orally Up to three times daily as needed for anxiety (Patient not taking: Reported on 06/26/2023)     methimazole (TAPAZOLE) 5 MG tablet Take 0.5 tablets (2.5 mg total) by mouth daily. (Patient not taking: Reported on 06/26/2023) 45 tablet 0   ondansetron (ZOFRAN-ODT) 4 MG disintegrating tablet Take 1 tablet (4 mg total) by mouth every 8 (eight) hours as needed for nausea or vomiting. (Patient not taking: Reported on 06/26/2023) 20 tablet 0   potassium chloride SA (KLOR-CON M) 20 MEQ tablet Take 1 tablet (20 mEq total) by mouth daily. (Patient not taking: Reported on 06/26/2023) 7 tablet 0   prazosin (MINIPRESS) 5 MG capsule Take 1 capsule (5 mg total) by mouth at bedtime. 30 capsule 3   Vitamin D, Ergocalciferol, (DRISDOL) 1.25 MG (50000 UNIT) CAPS capsule Take 1 capsule by mouth once a week (Patient not taking: Reported on 06/26/2023) 12 capsule 0   No current facility-administered medications for this visit.    Allergies as of 06/26/2023 - Review Complete 06/26/2023  Allergen Reaction Noted   Antihistamines, diphenhydramine-type Anxiety 10/25/2020   Bee venom Swelling and Other (See Comments) 04/12/2013   Hydrocodone Itching 01/24/2020   Penicillins Itching and Other (See Comments) 04/02/2016   Venofer [iron sucrose] Nausea And Vomiting and Other (See Comments) 10/25/2020   Reglan [metoclopramide] Anxiety 07/22/2016    Family History  Problem Relation Age of Onset   Heart disease Mother    Hypertension Mother    Hyperlipidemia Mother    Heart attack Mother    Heart failure Mother    Hypertension Father    Hyperlipidemia  Father    Diabetes Father    Cancer Father    Thyroid disease Father    Stroke Father    Epilepsy Brother    Eczema Daughter    Autism spectrum disorder Son    Colon cancer Neg Hx    Sleep apnea Neg Hx     Social History   Socioeconomic History   Marital status: Married    Spouse name: Not on file   Number of children: 3   Years of education: Not on file   Highest education level: Not on file  Occupational History   Not on file  Tobacco Use   Smoking status: Former    Current packs/day: 0.00    Average packs/day: 0.5 packs/day for 5.0 years (2.5 ttl pk-yrs)    Types: Cigarettes    Start date: 06/03/2005    Quit date: 06/03/2010    Years since quitting:  13.0   Smokeless tobacco: Never  Vaping Use   Vaping status: Never Used  Substance and Sexual Activity   Alcohol use: Not Currently    Alcohol/week: 2.0 standard drinks of alcohol    Types: 2 Glasses of wine per week    Comment: occ wine; denied 11/02/19   Drug use: Not Currently    Types: Marijuana    Comment: 11/02/19 states she used gummies 1-2 times a month.   Sexual activity: Yes    Birth control/protection: Surgical    Comment: vasectomy  Other Topics Concern   Not on file  Social History Narrative   ** Merged History Encounter **       Social Drivers of Corporate investment banker Strain: Not on file  Food Insecurity: Not on file  Transportation Needs: Not on file  Physical Activity: Not on file  Stress: Not on file  Social Connections: Not on file    Subjective: Review of Systems  Constitutional:  Negative for chills and fever.  HENT:  Negative for congestion and hearing loss.   Eyes:  Negative for blurred vision and double vision.  Respiratory:  Negative for cough and shortness of breath.   Cardiovascular:  Negative for chest pain and palpitations.  Gastrointestinal:  Positive for constipation and heartburn. Negative for abdominal pain, blood in stool, diarrhea, melena and vomiting.  Genitourinary:   Negative for dysuria and urgency.  Musculoskeletal:  Negative for joint pain and myalgias.  Skin:  Negative for itching and rash.  Neurological:  Negative for dizziness and headaches.  Psychiatric/Behavioral:  Negative for depression. The patient is not nervous/anxious.      Objective: BP 120/89   Pulse 93   Temp 97.8 F (36.6 C)   Ht 5\' 2"  (1.575 m)   Wt 190 lb 6.4 oz (86.4 kg)   LMP 06/11/2023   BMI 34.82 kg/m  Physical Exam Constitutional:      Appearance: Normal appearance.  HENT:     Head: Normocephalic and atraumatic.  Eyes:     Extraocular Movements: Extraocular movements intact.     Conjunctiva/sclera: Conjunctivae normal.  Cardiovascular:     Rate and Rhythm: Normal rate and regular rhythm.  Pulmonary:     Effort: Pulmonary effort is normal.     Breath sounds: Normal breath sounds.  Abdominal:     General: Bowel sounds are normal.     Palpations: Abdomen is soft.  Musculoskeletal:        General: No swelling. Normal range of motion.     Cervical back: Normal range of motion and neck supple.  Skin:    General: Skin is warm and dry.     Coloration: Skin is not jaundiced.  Neurological:     General: No focal deficit present.     Mental Status: She is alert and oriented to person, place, and time.  Psychiatric:        Mood and Affect: Mood normal.        Behavior: Behavior normal.      Assessment/Plan:  1.  Chronic GERD-not well-controlled on omeprazole 20 mg daily.  Previously trialed and failed pantoprazole.  States Dexilant worked the best for her.  Reports she has new insurance now and would like to go back on this medication.  Will send in prescription today.  2.  Chronic constipation-not well-controlled currently.  Tried MiraLAX which did not help.  Previously well-controlled on Linzess 72 mcg daily.  Will send in today.  3.  Iron  deficiency anemia-likely due to menorrhagia.  Continue follow-up with hematology and OB/GYN.  4.  Colon cancer  screening-patient inquired about repeat colonoscopy.  Not due until age 20.  Discussed this with her today.  Follow-up in 3 to 4 months. 06/26/2023 10:03 AM   Disclaimer: This note was dictated with voice recognition software. Similar sounding words can inadvertently be transcribed and may not be corrected upon review.

## 2023-07-04 ENCOUNTER — Inpatient Hospital Stay: Payer: Medicare HMO | Admitting: Physician Assistant

## 2023-07-07 ENCOUNTER — Other Ambulatory Visit: Payer: Self-pay | Admitting: *Deleted

## 2023-07-07 DIAGNOSIS — D500 Iron deficiency anemia secondary to blood loss (chronic): Secondary | ICD-10-CM

## 2023-07-22 ENCOUNTER — Telehealth: Payer: Self-pay | Admitting: *Deleted

## 2023-07-22 ENCOUNTER — Other Ambulatory Visit: Payer: Self-pay | Admitting: Physician Assistant

## 2023-07-22 NOTE — Progress Notes (Signed)
Labs from [facility] on 07/21/2023 show Hgb 12.3/MCV 78 with ferritin 8 and iron saturation 8%. We will schedule her for IV Feraheme x 2 ahead of her appointment with me in March 2025.  Rojelio Brenner, PA-C 07/22/2023 10:58 AM

## 2023-07-22 NOTE — Telephone Encounter (Signed)
Patient called with concerns regarding recent lab results and called to inquire as to if she would need iron infusion.  She is starting a new job on 3/4 and will not be available for infusions for approx 6 weeks.  Reviewed labs with Rojelio Brenner, PAC.  Will schedule for Feraheme 510 mg this week and next week.

## 2023-07-25 ENCOUNTER — Ambulatory Visit: Payer: Medicare HMO

## 2023-07-25 ENCOUNTER — Inpatient Hospital Stay: Payer: Medicare HMO | Attending: Hematology

## 2023-07-25 ENCOUNTER — Encounter: Payer: Self-pay | Admitting: Hematology

## 2023-07-25 VITALS — BP 114/62 | HR 76 | Temp 98.2°F | Resp 18

## 2023-07-25 DIAGNOSIS — D509 Iron deficiency anemia, unspecified: Secondary | ICD-10-CM | POA: Diagnosis present

## 2023-07-25 DIAGNOSIS — D500 Iron deficiency anemia secondary to blood loss (chronic): Secondary | ICD-10-CM

## 2023-07-25 MED ORDER — CETIRIZINE HCL 10 MG PO TABS
10.0000 mg | ORAL_TABLET | Freq: Once | ORAL | Status: AC
  Administered 2023-07-25: 10 mg via ORAL
  Filled 2023-07-25: qty 1

## 2023-07-25 MED ORDER — LORATADINE 10 MG PO TABS: 10.0000 mg | ORAL_TABLET | Freq: Once | ORAL | Status: DC

## 2023-07-25 MED ORDER — FAMOTIDINE IN NACL 20-0.9 MG/50ML-% IV SOLN
20.0000 mg | Freq: Once | INTRAVENOUS | Status: AC
  Administered 2023-07-25: 20 mg via INTRAVENOUS
  Filled 2023-07-25: qty 50

## 2023-07-25 MED ORDER — ACETAMINOPHEN 325 MG PO TABS
650.0000 mg | ORAL_TABLET | Freq: Once | ORAL | Status: AC
  Administered 2023-07-25: 650 mg via ORAL
  Filled 2023-07-25: qty 2

## 2023-07-25 MED ORDER — SODIUM CHLORIDE 0.9 % IV SOLN
510.0000 mg | Freq: Once | INTRAVENOUS | Status: AC
  Administered 2023-07-25: 510 mg via INTRAVENOUS
  Filled 2023-07-25: qty 510

## 2023-07-25 MED ORDER — METHYLPREDNISOLONE SODIUM SUCC 125 MG IJ SOLR
125.0000 mg | Freq: Once | INTRAMUSCULAR | Status: AC
  Administered 2023-07-25: 125 mg via INTRAVENOUS
  Filled 2023-07-25: qty 2

## 2023-07-25 MED ORDER — SODIUM CHLORIDE 0.9 % IV SOLN: Freq: Once | INTRAVENOUS | Status: AC

## 2023-07-25 NOTE — Patient Instructions (Signed)

## 2023-07-25 NOTE — Progress Notes (Signed)
 Patient tolerated iron infusion with no complaints voiced.  Peripheral IV site clean and dry with good blood return noted before and after infusion.  Band aid applied.  VSS with discharge and left in satisfactory condition with no s/s of distress noted.

## 2023-07-28 ENCOUNTER — Other Ambulatory Visit (HOSPITAL_COMMUNITY): Payer: Self-pay | Admitting: Adult Health

## 2023-07-28 DIAGNOSIS — R7989 Other specified abnormal findings of blood chemistry: Secondary | ICD-10-CM

## 2023-07-29 ENCOUNTER — Ambulatory Visit: Payer: Medicare HMO

## 2023-07-29 ENCOUNTER — Encounter: Payer: Self-pay | Admitting: Hematology

## 2023-08-01 ENCOUNTER — Inpatient Hospital Stay: Payer: Medicare HMO

## 2023-08-01 ENCOUNTER — Ambulatory Visit (HOSPITAL_COMMUNITY)
Admission: RE | Admit: 2023-08-01 | Discharge: 2023-08-01 | Disposition: A | Discharge status: Home / Self Care | Payer: Medicare HMO | Source: Ambulatory Visit | Attending: Adult Health | Admitting: Adult Health

## 2023-08-01 DIAGNOSIS — R7989 Other specified abnormal findings of blood chemistry: Secondary | ICD-10-CM | POA: Diagnosis present

## 2023-08-02 NOTE — Progress Notes (Unsigned)
 [facility] [address], [place] [address]   CLINIC:  Medical Oncology/Hematology  PCP:  Marylynn Pearson, FNP [address] [place] [address] [phone]   REASON FOR VISIT:  Follow-up for iron deficiency anemia   CURRENT THERAPY: Intermittent IV iron infusions  INTERVAL HISTORY:   Charlotte Mccormick 38 y.o. female returns for routine follow-up of iron deficiency anemia.  She was last seen by Georga Kaufmann, PA-C on 08/01/2022.  At today's visit, patient reports feeling fair.  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  She reports mild fatigue and ice pica for the past month.  She continues to have intermittent migraines, nausea, and constipation.  No dyspnea on exertion or chest pain.  No B symptoms.  Although she had to take a break from [facility] in early 2024 due to insurance issues, she has been back on Myfembree for the past 6 months, with better control of menorrhagia.  She denies any other source of blood loss such as melena, hematochezia, or epistaxis.     She has 75% energy and 70% appetite. She endorses that she is maintaining a stable weight.  ASSESSMENT & PLAN:  1.   Microcytic anemia with iron deficiency - Longstanding history of anemia, most likely secondary to menorrhagia  - EGD was 01/17/2020 which showed gastritis without clear signs of bleeding - Colonoscopy on 07/24/2020 showed nonbleeding internal hemorrhoids and diverticulosis, but was negative for signs of bleeding. - Capsule study in April 2022 was unremarkable - Other causes for anemia were negative (normal SPEP, haptoglobin, B12, folate, copper, LDH) - Failed oral iron therapy due to severe constipation and lack of improvement - Denies any GI blood loss within the past 6 months - Periods have been lighter after taking Myfembree - Labs from [facility]  on 07/21/2023 showed Hgb 12.3/MCV 78, with ferritin 8 and iron saturation 8%. - Most recent IV iron with Feraheme on 07/25/2023, with second dose of Feraheme scheduled to be given today.  Patient had REACTION TO VENOFER on 10/24/2020, with nausea, vomiting, diaphoresis.   - PLAN: Continue with IV Feraheme x 2 (first dose already given) - We will repeat labs and phone visit in 6 months   2.  Vitamin D deficiency - She has not been taking her vitamin D supplementation.  (Recommended vitamin D 2000 units OTC at last visit)  - Switched to vitamin D 50,000 units weekly by endocrinologist on 01/22/2022. - PLAN: Continue vitamin D dosing and monitoring per endocrinologist.   3.  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  No further work-up or treatment indicated.   4. Social and family history - Patient's father has cancer of unknown type.  He is also anemic, has received multiple blood transfusions over the past year. - Patient is a lifelong non-smoker.  Does not use drugs or alcohol   PLAN SUMMARY: >> Continue with next dose of Feraheme (#2 of 2) as scheduled TODAY >> Labs in 6 months (CBC/D, ferritin, iron/TIBC) via [facility]  >> PHONE visit after labs      REVIEW OF SYSTEMS:   Review of Systems  Constitutional:  Positive for fatigue.  Negative for appetite change, chills, diaphoresis, fever and unexpected weight change.  HENT:   Negative for lump/mass and nosebleeds.   Eyes:  Negative for eye problems.  Respiratory:  Negative for cough, hemoptysis and shortness of breath.   Cardiovascular:  Negative for chest pain, leg swelling and palpitations.  Gastrointestinal:  Positive for constipation and nausea. Negative for abdominal pain, blood in stool, diarrhea and vomiting.   Genitourinary:  Negative for hematuria.   Skin: Negative.   Neurological:  Positive for headaches and numbness. Negative for dizziness and light-headedness.  Hematological:  Does not bruise/bleed easily.     PHYSICAL EXAM:  ECOG PERFORMANCE STATUS: 1 - Symptomatic but completely ambulatory  Vitals:   08/04/23 0831  BP: (!) 124/94  Pulse: 86  Resp: 18  Temp: 98.5 F (36.9 C)  SpO2: 100%   Filed Weights   08/04/23 0831  Weight: 191 lb (86.6 kg)   Physical Exam Constitutional:      Appearance: Normal appearance. She is obese.  Cardiovascular:     Heart sounds: Normal heart sounds.  Pulmonary:     Breath sounds: Normal breath sounds.  Neurological:     General: No focal deficit present.     Mental Status: Mental status is at baseline.  Psychiatric:        Behavior: Behavior normal. Behavior is cooperative.     PAST MEDICAL/SURGICAL HISTORY:  Past Medical History:  Diagnosis Date   Abnormal uterine bleeding (AUB) 12/12/2015   Anemia    Anxiety    Asthma    Constipation    Depression    Fibroid    Genital herpes    GERD (gastroesophageal reflux disease)    History of chlamydia    Hyperemesis    IBS (irritable bowel syndrome)    Insomnia    Mental disorder    PTSD, ANXIETY,Depression   PTSD (post-traumatic stress disorder)    Sinusitis    Thyroid disease    Vaginal dryness 12/12/2015   Past Surgical History:  Procedure Laterality Date   COLONOSCOPY     2016   COLONOSCOPY N/A 04/08/2016   Procedure: COLONOSCOPY;  Surgeon: West Bali, MD;  [facility];  Service: Endoscopy;  Laterality: N/A;  10:15 AM   COLONOSCOPY WITH PROPOFOL N/A 07/24/2020   Procedure: COLONOSCOPY WITH PROPOFOL;  Surgeon: Lanelle Bal, DO;  [facility];  Service: Endoscopy;  Laterality: N/A;  11:00am   ESOPHAGOGASTRODUODENOSCOPY N/A 04/08/2016   Procedure: ESOPHAGOGASTRODUODENOSCOPY (EGD);  Surgeon: West Bali, MD;  [facility];  Service:  Endoscopy;  Laterality: N/A;   ESOPHAGOGASTRODUODENOSCOPY (EGD) WITH PROPOFOL N/A 01/17/2020   Procedure: ESOPHAGOGASTRODUODENOSCOPY (EGD) WITH PROPOFOL;  Surgeon: Lanelle Bal, DO;  [facility];  Service: Endoscopy;  Laterality: N/A;  8:15am   GIVENS CAPSULE STUDY N/A 09/28/2020   Procedure: GIVENS CAPSULE STUDY;  Surgeon: Lanelle Bal, DO;  [facility];  Service: Endoscopy;  Laterality: N/A;  7:30am   PERINEUM REPAIR     UPPER GASTROINTESTINAL ENDOSCOPY  2016    SOCIAL HISTORY:  Social History   Socioeconomic History   Marital status: Married    Spouse name: Not on file   Number of children: 3   Years of education: Not on file   Highest education level: Not on file  Occupational History   Not on file  Tobacco Use   Smoking status: Former    Current packs/day: 0.00    Average packs/day: 0.5 packs/day  for 5.0 years (2.5 ttl pk-yrs)    Types: Cigarettes    Start date: 06/03/2005    Quit date: 06/03/2010    Years since quitting: 13.1   Smokeless tobacco: Never  Vaping Use   Vaping status: Never Used  Substance and Sexual Activity   Alcohol use: Not Currently    Alcohol/week: 2.0 standard drinks of alcohol    Types: 2 Glasses of wine per week    Comment: occ wine; denied 11/02/19   Drug use: Not Currently    Types: Marijuana    Comment: 11/02/19 states she used gummies 1-2 times a month.   Sexual activity: Yes    Birth control/protection: Surgical    Comment: vasectomy  Other Topics Concern   Not on file  Social History Narrative   ** Merged History Encounter **       Social Drivers of Corporate investment banker Strain: Not on file  Food Insecurity: Not on file  Transportation Needs: Not on file  Physical Activity: Not on file  Stress: Not on file  Social Connections: Not on file  Intimate Partner Violence: Not on file    FAMILY HISTORY:  Family History  Problem Relation Age of Onset   Heart disease Mother    Hypertension Mother     Hyperlipidemia Mother    Heart attack Mother    Heart failure Mother    Hypertension Father    Hyperlipidemia Father    Diabetes Father    Cancer Father    Thyroid disease Father    Stroke Father    Epilepsy Brother    Eczema Daughter    Autism spectrum disorder Son    Colon cancer Neg Hx    Sleep apnea Neg Hx     CURRENT MEDICATIONS:  Outpatient Encounter Medications as of 08/04/2023  Medication Sig Note   acetaminophen (TYLENOL) 500 MG tablet Take 500 mg by mouth every 6 (six) hours as needed for mild pain or moderate pain.    albuterol (VENTOLIN HFA) 108 (90 Base) MCG/ACT inhaler Inhale 1 puff into the lungs every 6 (six) hours as needed for wheezing or shortness of breath.    busPIRone (BUSPAR) 10 MG tablet Take 10 mg by mouth 3 (three) times daily.    dexlansoprazole (DEXILANT) 60 MG capsule Take 1 capsule (60 mg total) by mouth daily.    EPINEPHrine 0.3 mg/0.3 mL IJ SOAJ injection INJECT CONTENTS OF 1 PEN AS NEEDED FOR ALLERGIC REACTION    escitalopram (LEXAPRO) 20 MG tablet Take 1 tablet by mouth daily.    linaclotide (LINZESS) 72 MCG capsule Take 1 capsule (72 mcg total) by mouth daily before breakfast.    meloxicam (MOBIC) 7.5 MG tablet Take 7.5 mg by mouth daily.    montelukast (SINGULAIR) 10 MG tablet Take 10 mg by mouth daily.    ondansetron (ZOFRAN-ODT) 4 MG disintegrating tablet DISSOLVE 1 TABLET IN MOUTH EVERY 8 HOURS AS NEEDED FOR NAUSEA OR VOMITING. PLACE TOP OF THE TONGUE WHERE IT WILL DISSOLVE, THEN SWALLOW    Relugolix-Estradiol-Norethind (MYFEMBREE) 40-1-0.5 MG TABS Take 1 tablet by mouth once daily    rizatriptan (MAXALT) 10 MG tablet Take 1 tablet by mouth daily as needed. At onset of headache    WELLBUTRIN XL 300 MG 24 hr tablet Take 300 mg by mouth daily.    [DISCONTINUED] LEXAPRO 5 MG tablet 1 tablet Orally Once a day for 30 days    prazosin (MINIPRESS) 5 MG capsule Take 1 capsule (5 mg  total) by mouth at bedtime.    [DISCONTINUED] escitalopram (LEXAPRO) 10  MG tablet Take 1 tablet (10 mg total) by mouth daily. 06/26/2023: Pt still taking   [DISCONTINUED] LO LOESTRIN FE 1 MG-10 MCG / 10 MCG tablet Take 1 tablet by mouth daily. (Patient not taking: Reported on 10/13/2019)    [DISCONTINUED] zolpidem (AMBIEN CR) 12.5 MG CR tablet Take 1 tablet (12.5 mg total) by mouth at bedtime as needed for sleep. (Patient not taking: No sig reported)    No facility-administered encounter medications on file as of 08/04/2023.    ALLERGIES:  Allergies  Allergen Reactions   Antihistamines, Diphenhydramine-Type Anxiety    Patient reports she cannot tolerate diphenhydramine with symptoms of anxiety   Bee Venom Swelling and Other (See Comments)    Reaction:  Localized swelling    Hydrocodone Itching   Penicillins Itching and Other (See Comments)    Has patient had a PCN reaction causing immediate rash, facial/tongue/throat swelling, SOB or lightheadedness with hypotension: No Has patient had a PCN reaction causing severe rash involving mucus membranes or skin necrosis: No Has patient had a PCN reaction that required hospitalization No Has patient had a PCN reaction occurring within the last 10 years: Yes If all of the above answers are "NO", then may proceed with Cephalosporin use.    Venofer [Iron Sucrose] Nausea And Vomiting and Other (See Comments)    Diaphoresis and vomiting   Reglan [Metoclopramide] Anxiety    LABORATORY DATA:  I have reviewed the labs as listed.  CBC    Component Value Date/Time   WBC 5.3 02/13/2022 2337   RBC 5.11 02/13/2022 2337   HGB 14.9 02/13/2022 2337   HGB 14.4 05/14/2021 1536   HCT 43.9 02/13/2022 2337   HCT 43.5 05/14/2021 1536   PLT 237 02/13/2022 2337   PLT 271 05/14/2021 1536   MCV 85.9 02/13/2022 2337   MCV 86 05/14/2021 1536   MCH 29.2 02/13/2022 2337   MCHC 33.9 02/13/2022 2337   RDW 12.2 02/13/2022 2337   RDW 12.1 05/14/2021 1536   LYMPHSABS 1.2 02/13/2022 2337   LYMPHSABS 1.4 05/14/2021 1536   MONOABS 0.4  02/13/2022 2337   EOSABS 0.1 02/13/2022 2337   EOSABS 0.1 05/14/2021 1536   BASOSABS 0.0 02/13/2022 2337   BASOSABS 0.0 05/14/2021 1536      Latest Ref Rng & Units 02/13/2022   11:37 PM 08/27/2021    4:58 AM 10/15/2019   12:00 AM  CMP  Glucose 70 - 99 mg/dL 99  244    BUN 6 - 20 mg/dL 11  10  7       Creatinine 0.44 - 1.00 mg/dL 0.10  2.72  0.7      Sodium 135 - 145 mmol/L 140  137    Potassium 3.5 - 5.1 mmol/L 3.3  3.4    Chloride 98 - 111 mmol/L 109  106    CO2 22 - 32 mmol/L 22  23    Calcium 8.9 - 10.3 mg/dL 9.4  9.0    Total Protein 6.5 - 8.1 g/dL  8.0    Total Bilirubin 0.3 - 1.2 mg/dL  0.6    Alkaline Phos 38 - 126 U/L  152    AST 15 - 41 U/L  61    ALT 0 - 44 U/L  70       This result is from an external source.    DIAGNOSTIC IMAGING:  I have independently reviewed the relevant imaging and discussed  with the patient.   WRAP UP:  All questions were answered. The patient knows to call the clinic with any problems, questions or concerns.  Medical decision making: Moderate  Time spent on visit: I spent 20 minutes counseling the patient face to face. The total time spent in the appointment was 30 minutes and more than 50% was on counseling.  Carnella Guadalajara, PA-C  08/04/23 9:08 AM

## 2023-08-04 ENCOUNTER — Encounter: Payer: Self-pay | Admitting: Hematology

## 2023-08-04 ENCOUNTER — Inpatient Hospital Stay: Payer: Medicare HMO | Attending: Physician Assistant | Admitting: Physician Assistant

## 2023-08-04 ENCOUNTER — Inpatient Hospital Stay: Payer: Medicare HMO

## 2023-08-04 VITALS — BP 122/82 | HR 82 | Temp 97.8°F | Resp 18

## 2023-08-04 VITALS — BP 124/94 | HR 86 | Temp 98.5°F | Resp 18 | Ht 62.0 in | Wt 191.0 lb

## 2023-08-04 DIAGNOSIS — Z79899 Other long term (current) drug therapy: Secondary | ICD-10-CM | POA: Insufficient documentation

## 2023-08-04 DIAGNOSIS — D500 Iron deficiency anemia secondary to blood loss (chronic): Secondary | ICD-10-CM | POA: Diagnosis not present

## 2023-08-04 DIAGNOSIS — D509 Iron deficiency anemia, unspecified: Secondary | ICD-10-CM | POA: Insufficient documentation

## 2023-08-04 MED ORDER — FAMOTIDINE IN NACL 20-0.9 MG/50ML-% IV SOLN
20.0000 mg | Freq: Once | INTRAVENOUS | Status: AC
  Administered 2023-08-04: 20 mg via INTRAVENOUS
  Filled 2023-08-04: qty 50

## 2023-08-04 MED ORDER — ONDANSETRON 4 MG PO TBDP: 4.0000 mg | ORAL_TABLET | Freq: Three times a day (TID) | ORAL | 0 refills | Status: AC | PRN

## 2023-08-04 MED ORDER — METHYLPREDNISOLONE SODIUM SUCC 125 MG IJ SOLR
125.0000 mg | Freq: Once | INTRAMUSCULAR | Status: AC
  Administered 2023-08-04: 125 mg via INTRAVENOUS
  Filled 2023-08-04: qty 2

## 2023-08-04 MED ORDER — LORATADINE 10 MG PO TABS: 10.0000 mg | ORAL_TABLET | Freq: Once | ORAL | Status: DC

## 2023-08-04 MED ORDER — SODIUM CHLORIDE 0.9 % IV SOLN
510.0000 mg | Freq: Once | INTRAVENOUS | Status: AC
  Administered 2023-08-04: 510 mg via INTRAVENOUS
  Filled 2023-08-04: qty 510

## 2023-08-04 MED ORDER — ACETAMINOPHEN 325 MG PO TABS
650.0000 mg | ORAL_TABLET | Freq: Once | ORAL | Status: AC
  Administered 2023-08-04: 650 mg via ORAL
  Filled 2023-08-04: qty 2

## 2023-08-04 MED ORDER — SODIUM CHLORIDE 0.9 % IV SOLN: Freq: Once | INTRAVENOUS | Status: AC

## 2023-08-04 MED ORDER — CETIRIZINE HCL 10 MG PO TABS
10.0000 mg | ORAL_TABLET | Freq: Once | ORAL | Status: AC
  Administered 2023-08-04: 10 mg via ORAL
  Filled 2023-08-04: qty 1

## 2023-08-04 NOTE — Patient Instructions (Signed)

## 2023-08-04 NOTE — Addendum Note (Signed)
 Addended by: Rojelio Brenner on: 08/04/2023 11:30 AM   Modules accepted: Orders

## 2023-08-04 NOTE — Patient Instructions (Signed)
 [facility] at [facility] **VISIT SUMMARY & IMPORTANT INSTRUCTIONS **   You were seen today by Rojelio Brenner PA-C for your iron deficiency anemia and B12 deficiency.    IRON DEFICIENCY ANEMIA: Your blood levels look good, but your iron levels are low.  You will receive your second dose of IV Feraheme today.  LABS: Return in 6 months for repeat labs (at [facility])  FOLLOW-UP APPOINTMENT: Phone visit in 6 months, after labs  ** Thank you for trusting me with your healthcare!  I strive to provide all of my patients with quality care at each visit.  If you receive a survey for this visit, I would be so grateful to you for taking the time to provide feedback.  Thank you in advance!  ~ Kadedra Vanaken                   Dr. Doreatha Massed   &   Rojelio Brenner, PA-C   - - - - - - - - - - - - - - - - - -    Thank you for choosing [facility] at [facility] to provide your oncology and hematology care.  To afford each patient quality time with our provider, please arrive at least 15 minutes before your scheduled appointment time.   If you have a lab appointment with the Cancer Center please come in thru the Main Entrance and check in at the main information desk.  You need to re-schedule your appointment should you arrive 10 or more minutes late.  We strive to give you quality time with our providers, and arriving late affects you and other patients whose appointments are after yours.  Also, if you no show three or more times for appointments you may be dismissed from the clinic at the providers discretion.     Again, thank you for choosing [facility].  Our hope is that these requests will decrease the amount of time that you wait before being seen by our physicians.       _____________________________________________________________  Should you have questions after your visit to [facility] please contact our office at  [phone] and follow the prompts.  Our office hours are 8:00 a.m. and 4:30 p.m. Monday - Friday.  Please note that voicemails left after 4:00 p.m. may not be returned until the following business day.  We are closed weekends and major holidays.  You do have access to a nurse 24-7, just call the main number to the clinic [phone] and do not press any options, hold on the line and a nurse will answer the phone.    For prescription refill requests, have your pharmacy contact our office and allow 72 hours.

## 2023-08-04 NOTE — Progress Notes (Signed)
 Pt request zofran to be sent to her pharmacy at [facility] in [place]. Rebekah PA notified and sent prescription.  Patient tolerated Feraheme  infusion with no complaints voiced.  Peripheral IV site clean and dry with good blood return noted before and after infusion.  Band aid applied.  Pt refuse to wait the 30 minute post Feraheme infusion.  VSS with discharge and left in satisfactory condition with no s/s of distress noted. All follow ups as scheduled.   Charlotte Mccormick [employer]

## 2023-08-05 ENCOUNTER — Encounter: Payer: Self-pay | Admitting: Hematology

## 2023-09-03 ENCOUNTER — Encounter: Payer: Self-pay | Admitting: Internal Medicine

## 2023-11-12 ENCOUNTER — Encounter: Payer: Self-pay | Admitting: Physician Assistant

## 2023-11-12 ENCOUNTER — Other Ambulatory Visit: Payer: Self-pay | Admitting: *Deleted

## 2023-11-12 DIAGNOSIS — D500 Iron deficiency anemia secondary to blood loss (chronic): Secondary | ICD-10-CM

## 2023-11-13 ENCOUNTER — Other Ambulatory Visit: Payer: Self-pay | Admitting: Obstetrics & Gynecology

## 2023-11-18 ENCOUNTER — Telehealth: Payer: Self-pay | Admitting: *Deleted

## 2023-11-18 NOTE — Telephone Encounter (Signed)
 Patient had reached out due to fatigue and symptoms of IDA.  Labs were completed and reviewed by Sheril Dines, PAC. Ferritin is slightly below goal at 74 (goal is ferritin >100), so she could have some symptomatic iron  deficiency. However, her blood count is higher than usual, which is something that we will commonly see in patients who are dehydrated, so she recommended that she really focus on drinking more water .  We will recheck her CBC at follow up and perform any additional workup needed if hgb remains elevated.  She was offered Feraheme  x 1 dose, however patient opted for hydration at this time.  Sheril Dines, PAC aware.

## 2024-02-04 ENCOUNTER — Inpatient Hospital Stay: Admitting: Physician Assistant

## 2024-02-11 ENCOUNTER — Inpatient Hospital Stay: Attending: Oncology | Admitting: Oncology

## 2024-02-11 DIAGNOSIS — D500 Iron deficiency anemia secondary to blood loss (chronic): Secondary | ICD-10-CM | POA: Diagnosis not present

## 2024-02-11 DIAGNOSIS — D709 Neutropenia, unspecified: Secondary | ICD-10-CM | POA: Diagnosis not present

## 2024-02-11 DIAGNOSIS — E559 Vitamin D deficiency, unspecified: Secondary | ICD-10-CM | POA: Diagnosis not present

## 2024-02-11 MED ORDER — FERROUS GLUCONATE 324 (38 FE) MG PO TABS: 324.0000 mg | ORAL_TABLET | Freq: Every day | ORAL | 0 refills | Status: AC

## 2024-02-11 NOTE — Assessment & Plan Note (Addendum)
-    Labs from [place] on 02/06/2024 show hemoglobin of 14.2 with normal differential, iron  saturation 19% with TIBC 327.  Ferritin 28 (74).  - Most recent IV iron  with Feraheme  on 07/25/2023 and 08/04/2023.  -atient had REACTION TO VENOFER  on 10/24/2020, with nausea, vomiting, diaphoresis.   - Although hemoglobin is normal iron  saturations and ferritin is low.  Recommend 1-2 doses of IV Feraheme . -She would like to avoid additional IV iron  at this time.  She would like to try over-the-counter ferrous gluconate  once daily or every other day along with vitamin C to see if this was helpful.  If no improvement or unable to tolerate, she will let us  know. - We will repeat labs and phone visit in 6 months.

## 2024-02-11 NOTE — Assessment & Plan Note (Addendum)
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

## 2024-02-11 NOTE — Assessment & Plan Note (Addendum)
-   She has not been taking her vitamin D  supplementation.  (Recommended vitamin D  2000 units OTC at last visit)  - Switched to vitamin D  50,000 units weekly by endocrinologist on 01/22/2022. -Do not have any recent vitamin D  levels.

## 2024-02-11 NOTE — Progress Notes (Signed)
 [facility] OFFICE PROGRESS NOTE  I connected with Charlotte Mccormick on 02/11/24 at 10:15 AM EDT by telephone visit and verified that I am speaking with the correct person using two identifiers.   I discussed the limitations, risks, security and privacy concerns of performing an evaluation and management service by telemedicine and the availability of in-person appointments. I also discussed with the patient that there may be a patient responsible charge related to this service. The patient expressed understanding and agreed to proceed.   Other persons participating in the visit and their role in the encounter: NP, Patient   Patient's location: Home Provider's location: Clinic    [place], [place], [place]  ASSESSMENT & PLAN:    Assessment & Plan Iron  deficiency anemia due to chronic blood loss -  Labs from [place] on 02/06/2024 show hemoglobin of 14.2 with normal differential, iron  saturation 19% with TIBC 327.  Ferritin 28 (74).  - Most recent IV iron  with Feraheme  on 07/25/2023 and 08/04/2023.  -atient had REACTION TO VENOFER  on 10/24/2020, with nausea, vomiting, diaphoresis.   - Although hemoglobin is normal iron  saturations and ferritin is low.  Recommend 1-2 doses of IV Feraheme . -She would like to avoid additional IV iron  at this time.  She would like to try over-the-counter ferrous gluconate  once daily or every other day along with vitamin C to see if this was helpful.  If no improvement or unable to tolerate, she will let us  know. - We will repeat labs and phone visit in 6 months. Vitamin D  deficiency - She has not been taking her vitamin D  supplementation.  (Recommended vitamin D  2000 units OTC at last visit)  - Switched to vitamin D  50,000 units weekly by endocrinologist on 01/22/2022. -Do not have any recent vitamin D  levels. 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   Orders Placed This Encounter  Procedures   CBC with Differential/Platelet    Standing Status:   Future    Expected Date:   08/13/2024    Expiration Date:   02/10/2025    Release to patient:   Immediate   Iron  and TIBC    Standing Status:   Future    Expected Date:   08/13/2024    Expiration Date:   02/10/2025    Release to patient:   Immediate   Ferritin    Standing Status:   Future    Expected Date:   08/13/2024    Expiration Date:   02/10/2025    Release to patient:   Immediate    INTERVAL HISTORY: Patient returns for follow-up for iron  deficiency anemia, vitamin D  deficiency and neutropenia.  She received IV Feraheme  last on 07/25/2023 and 08/04/2023.  She denies any interval hospitalizations, surgeries or changes to her baseline health.  Reports feeling much better following her iron  infusions.  She has been feeling more tired over the past 2 to 3 months.  Menstrual cycles have been fairly stable and light.  She continues to take Myfembree .  She is currently not taking iron  supplement secondary to constipation.  States she was given a stool softener that she has been using as needed for constipation.  Denies any abdominal pain.  We reviewed CBC, CMP, iron  panel and ferritin.  SUMMARY OF HEMATOLOGIC HISTORY: Oncology History   No history  exists.   - Longstanding history of anemia, most likely secondary to menorrhagia  - EGD was 01/17/2020 which showed gastritis without clear signs of bleeding - Colonoscopy on 07/24/2020 showed nonbleeding internal hemorrhoids and diverticulosis, but was negative for signs of bleeding. - Capsule study in April 2022 was unremarkable - Other causes for anemia were negative (normal SPEP, haptoglobin, B12, folate, copper , LDH) - Failed oral iron  therapy due to severe constipation and lack of improvement - Denies any GI blood loss within the past 6 months -  Periods have been lighter after taking Myfembree   CBC    Component Value Date/Time   WBC 5.3 02/13/2022 2337   RBC 5.11 02/13/2022 2337   HGB 14.9 02/13/2022 2337   HGB 14.4 05/14/2021 1536   HCT 43.9 02/13/2022 2337   HCT 43.5 05/14/2021 1536   PLT 237 02/13/2022 2337   PLT 271 05/14/2021 1536   MCV 85.9 02/13/2022 2337   MCV 86 05/14/2021 1536   MCH 29.2 02/13/2022 2337   MCHC 33.9 02/13/2022 2337   RDW 12.2 02/13/2022 2337   RDW 12.1 05/14/2021 1536   LYMPHSABS 1.2 02/13/2022 2337   LYMPHSABS 1.4 05/14/2021 1536   MONOABS 0.4 02/13/2022 2337   EOSABS 0.1 02/13/2022 2337   EOSABS 0.1 05/14/2021 1536   BASOSABS 0.0 02/13/2022 2337   BASOSABS 0.0 05/14/2021 1536       Latest Ref Rng & Units 02/13/2022   11:37 PM 08/27/2021    4:58 AM 10/15/2019   12:00 AM  CMP  Glucose 70 - 99 mg/dL 99  874    BUN 6 - 20 mg/dL 11  10  7       Creatinine 0.44 - 1.00 mg/dL 9.30  9.30  0.7      Sodium 135 - 145 mmol/L 140  137    Potassium 3.5 - 5.1 mmol/L 3.3  3.4    Chloride 98 - 111 mmol/L 109  106    CO2 22 - 32 mmol/L 22  23    Calcium  8.9 - 10.3 mg/dL 9.4  9.0    Total Protein 6.5 - 8.1 g/dL  8.0    Total Bilirubin 0.3 - 1.2 mg/dL  0.6    Alkaline Phos 38 - 126 U/L  152    AST 15 - 41 U/L  61    ALT 0 - 44 U/L  70       This result is from an external source.     Lab Results  Component Value Date   FERRITIN 58 05/14/2021   VITAMINB12 517 08/28/2020    There were no vitals filed for this visit.  Review of System:  Review of Systems  Constitutional:  Positive for malaise/fatigue.  Gastrointestinal:  Positive for constipation.  Neurological:  Positive for tingling.  Psychiatric/Behavioral:  Positive for depression. The patient is nervous/anxious.     Physical Exam: Physical Exam Neurological:     Mental Status: She is alert and oriented to person, place, and time.     .I provided 18 minutes of non face-to-face telephone visit time during this encounter, and > 50%  was spent counseling as documented under my assessment & plan.   Delon Hope, NP 02/11/2024 9:22 AM

## 2024-03-03 ENCOUNTER — Ambulatory Visit: Admitting: Women's Health

## 2024-04-06 ENCOUNTER — Encounter: Payer: Self-pay | Admitting: Women's Health

## 2024-04-06 ENCOUNTER — Ambulatory Visit: Admitting: Women's Health

## 2024-04-06 ENCOUNTER — Other Ambulatory Visit (HOSPITAL_COMMUNITY)
Admission: RE | Admit: 2024-04-06 | Discharge: 2024-04-06 | Disposition: A | Discharge status: Home / Self Care | Source: Ambulatory Visit | Attending: Women's Health | Admitting: Women's Health

## 2024-04-06 VITALS — BP 119/82 | HR 81 | Ht 62.0 in | Wt 194.0 lb

## 2024-04-06 DIAGNOSIS — Z1151 Encounter for screening for human papillomavirus (HPV): Secondary | ICD-10-CM | POA: Diagnosis not present

## 2024-04-06 DIAGNOSIS — Z01419 Encounter for gynecological examination (general) (routine) without abnormal findings: Secondary | ICD-10-CM | POA: Diagnosis present

## 2024-04-06 DIAGNOSIS — Z124 Encounter for screening for malignant neoplasm of cervix: Secondary | ICD-10-CM | POA: Insufficient documentation

## 2024-04-06 DIAGNOSIS — Z8742 Personal history of other diseases of the female genital tract: Secondary | ICD-10-CM | POA: Diagnosis not present

## 2024-04-06 NOTE — Progress Notes (Signed)
 WELL-WOMAN EXAMINATION Patient name: Charlotte Mccormick MRN [id number]  Date of birth: 1986-01-08 Chief Complaint:   Gynecologic Exam (Pap 02-15-2019 normal)  History of Present Illness:   Charlotte Mccormick is a 38 y.o. G34P3003 African-American female being seen today for a routine well-woman exam.  Current complaints: none, on Myfembree  for heavy periods, doing well, changes pad q 3 hrs or so.   PCP: [facility]      does not desire labs Patient's last menstrual period was 04/04/2024. The current method of family planning is vasectomy.  Last pap 02/15/19. Results were: NILM w/ HRHPV negative. H/O abnormal pap: no Last mammogram: never. Results were: N/A. Family h/o breast cancer: no Last colonoscopy: 2022 done d/t IDA. Results were: abnormal diverticulosis. Family h/o colorectal cancer: no     04/06/2024    8:39 AM 02/11/2024   10:09 AM 02/15/2019    4:16 PM 08/20/2017   11:07 AM 05/22/2016    2:11 PM  Depression screen PHQ 2/9  Decreased Interest 1 0 3 3 2   Down, Depressed, Hopeless 1 0 3 3 1   PHQ - 2 Score 2 0 6 6 3   Altered sleeping 1  1 3 3   Tired, decreased energy 3  2 3 3   Change in appetite 1  2 3 3   Feeling bad or failure about yourself  1  2 2 1   Trouble concentrating 2  3 1 1   Moving slowly or fidgety/restless 0  0 0 0  Suicidal thoughts   0 2 0   PHQ-9 Score 10  16 20 14      Data saved with a previous flowsheet row definition        04/06/2024    8:39 AM  GAD 7 : Generalized Anxiety Score  Nervous, Anxious, on Edge 1  Control/stop worrying 1  Worry too much - different things 1  Trouble relaxing 2  Restless 0  Easily annoyed or irritable 1  Afraid - awful might happen 1  Total GAD 7 Score 7     Review of Systems:   Pertinent items are noted in HPI Denies any headaches, blurred vision, fatigue, shortness of breath, chest pain, abdominal pain, abnormal vaginal discharge/itching/odor/irritation, problems with periods, bowel movements, urination, or  intercourse unless otherwise stated above. Pertinent History Reviewed:  Reviewed past medical,surgical, social and family history.  Reviewed problem list, medications and allergies. Physical Assessment:   Vitals:   04/06/24 0830  BP: 119/82  Pulse: 81  Weight: 194 lb (88 kg)  Height: 5' 2 (1.575 m)  Body mass index is 35.48 kg/m.        Physical Examination:   General appearance - well appearing, and in no distress  Mental status - alert, oriented to person, place, and time  Psych:  She has a normal mood and affect  Skin - warm and dry, normal color, no suspicious lesions noted  Chest - effort normal, all lung fields clear to auscultation bilaterally  Heart - normal rate and regular rhythm  Neck:  midline trachea, no thyromegaly or nodules  Breasts - breasts appear normal, no suspicious masses, no skin or nipple changes or  axillary nodes  Abdomen - soft, nontender, nondistended, no masses or organomegaly  Pelvic - VULVA: normal appearing vulva with no masses, tenderness or lesions  VAGINA: normal appearing vagina with normal color and discharge, no lesions, on menses  CERVIX: very anterior, normal appearing cervix without discharge or lesions, no CMT  Thin prep pap is done w/  HR HPV cotesting  UTERUS: uterus is felt to be normal size, shape, consistency and nontender   ADNEXA: No adnexal masses or tenderness noted.  Extremities:  No swelling or varicosities noted  Chaperone: Peggy Dones  No results found for this or any previous visit (from the past 24 hours).  Assessment & Plan:  1) Well-Woman Exam  2) H/o menorrhagia> improved on Myfembree   Labs/procedures today: pap  Mammogram: @ 38yo, or sooner if problems Colonoscopy: @ 38yo, or sooner if problems  No orders of the defined types were placed in this encounter.   Meds: No orders of the defined types were placed in this encounter.   Follow-up: Return in about 1 year (around 04/06/2025) for Physical.  Suzen JONELLE Fetters CNM, WHNP-BC 04/06/2024 9:01 AM

## 2024-04-08 ENCOUNTER — Ambulatory Visit: Payer: Self-pay | Admitting: Women's Health

## 2024-04-08 DIAGNOSIS — R87619 Unspecified abnormal cytological findings in specimens from cervix uteri: Secondary | ICD-10-CM | POA: Insufficient documentation

## 2024-04-08 LAB — CYTOLOGY - PAP
Comment: NEGATIVE
Diagnosis: UNDETERMINED — AB
High risk HPV: NEGATIVE

## 2024-05-11 ENCOUNTER — Encounter: Payer: Self-pay | Admitting: Oncology

## 2024-05-11 ENCOUNTER — Encounter: Payer: Self-pay | Admitting: Adult Health

## 2024-05-11 ENCOUNTER — Other Ambulatory Visit (HOSPITAL_BASED_OUTPATIENT_CLINIC_OR_DEPARTMENT_OTHER): Payer: Self-pay

## 2024-05-11 ENCOUNTER — Ambulatory Visit: Admitting: Adult Health

## 2024-05-11 VITALS — BP 126/82 | HR 92 | Ht 62.0 in | Wt 192.0 lb

## 2024-05-11 DIAGNOSIS — R238 Other skin changes: Secondary | ICD-10-CM

## 2024-05-11 DIAGNOSIS — R234 Changes in skin texture: Secondary | ICD-10-CM

## 2024-05-11 MED ORDER — VALACYCLOVIR HCL 1 G PO TABS: 1000.0000 mg | ORAL_TABLET | Freq: Two times a day (BID) | ORAL | 1 refills | Status: AC

## 2024-05-11 MED ORDER — SILVER SULFADIAZINE 1 % EX CREA: 1.0000 | TOPICAL_CREAM | Freq: Two times a day (BID) | CUTANEOUS | 0 refills | Status: AC

## 2024-05-11 MED ORDER — COMIRNATY 30 MCG/0.3ML IM SUSY
0.3000 mL | PREFILLED_SYRINGE | Freq: Once | INTRAMUSCULAR | 0 refills | Status: AC
  Filled 2024-05-11: qty 0.3, 1d supply, fill #0

## 2024-05-11 NOTE — Progress Notes (Signed)
  Subjective:     Patient ID: Charlotte Mccormick, female   DOB: 04-07-1986, 38 y.o.   MRN: [id number]  HPI Charlotte Mccormick is a 38 year old black female, married, G3P3003, in complaining of tear between butt cheeks and has itching and burning there too. Has tied proctozone  HC without relief, first noticed Thursday.     Component Value Date/Time   DIAGPAP (A) 04/06/2024 0846    - Atypical squamous cells of undetermined significance (ASC-US )   DIAGPAP  02/15/2019 0000    NEGATIVE FOR INTRAEPITHELIAL LESIONS OR MALIGNANCY.   HPVHIGH Negative 04/06/2024 0846   ADEQPAP  04/06/2024 0846    Satisfactory for evaluation; transformation zone component PRESENT.   ADEQPAP  02/15/2019 0000    Satisfactory for evaluation  endocervical/transformation zone component PRESENT.     Review of Systems +tear between butt cheeks and has itching and burning there too. Has tied proctozone  HC without relief, first noticed Thursday   Reviewed past medical,surgical, social and family history. Reviewed medications and allergies.  Objective:   Physical Exam BP 126/82 (BP Location: Left Arm, Patient Position: Sitting, Cuff Size: Normal)   Pulse 92   Ht 5' 2 (1.575 m)   Wt 192 lb (87.1 kg)   LMP 04/20/2024 (Approximate)   BMI 35.12 kg/m     Skin is warm and dry, had 2 cm fissure near top of gluteal cleft, and has 1 cm area on right buttock that looks like vesicle that has top removed, HSV culture  obtained.   Upstream - 05/11/24 1105       Pregnancy Intention Screening   Does the patient want to become pregnant in the next year? No    Does the patient's partner want to become pregnant in the next year? No    Would the patient like to discuss contraceptive options today? No      Contraception Wrap Up   Current Method Vasectomy    End Method Vasectomy    Contraception Counseling Provided No         Examination chaperoned by Clarita Salt LPN   Assessment:      1. Vesicles (Primary) has 1 cm area on right  buttock that looks like vesicle that has top removed, HSV culture  obtained. HSV culture sent Will rx valtrex  1 gm 1 bid x 10 days  Meds ordered this encounter  Medications   valACYclovir  (VALTREX ) 1000 MG tablet    Sig: Take 1 tablet (1,000 mg total) by mouth 2 (two) times daily.    Dispense:  20 tablet    Refill:  1    Supervising Provider:   JAYNE MINDER H [[id number]]   silver  sulfADIAZINE  (SILVADENE ) 1 % cream    Sig: Apply 1 Application topically 2 (two) times daily.    Dispense:  50 g    Refill:  0    Supervising Provider:   JAYNE MINDER H [[id number]]    2. Fissure of skin +2 cm fissure near top of gluteal cleft,   Use warm water  to cleanse and pat dry and apply silvadene  bid  Plan:     Follow up in 1 week for recheck

## 2024-05-11 NOTE — Addendum Note (Signed)
 Addended by: NEYSA CLARITA RAMAN on: 05/11/2024 11:50 AM   Modules accepted: Orders

## 2024-05-14 DIAGNOSIS — M2550 Pain in unspecified joint: Secondary | ICD-10-CM | POA: Insufficient documentation

## 2024-05-14 DIAGNOSIS — D72819 Decreased white blood cell count, unspecified: Secondary | ICD-10-CM | POA: Insufficient documentation

## 2024-05-14 DIAGNOSIS — G894 Chronic pain syndrome: Secondary | ICD-10-CM | POA: Insufficient documentation

## 2024-05-14 DIAGNOSIS — E66811 Obesity, class 1: Secondary | ICD-10-CM | POA: Insufficient documentation

## 2024-05-14 DIAGNOSIS — R5383 Other fatigue: Secondary | ICD-10-CM | POA: Insufficient documentation

## 2024-05-14 DIAGNOSIS — M064 Inflammatory polyarthropathy: Secondary | ICD-10-CM | POA: Insufficient documentation

## 2024-05-14 LAB — SPECIMEN STATUS REPORT

## 2024-05-17 ENCOUNTER — Encounter: Payer: Self-pay | Admitting: Orthopedic Surgery

## 2024-05-17 ENCOUNTER — Ambulatory Visit: Payer: Self-pay | Admitting: Adult Health

## 2024-05-17 ENCOUNTER — Ambulatory Visit (INDEPENDENT_AMBULATORY_CARE_PROVIDER_SITE_OTHER): Admitting: Orthopedic Surgery

## 2024-05-17 ENCOUNTER — Other Ambulatory Visit: Payer: Self-pay

## 2024-05-17 VITALS — BP 126/82 | Ht 62.0 in | Wt 190.0 lb

## 2024-05-17 DIAGNOSIS — M545 Low back pain, unspecified: Secondary | ICD-10-CM | POA: Insufficient documentation

## 2024-05-17 MED ORDER — TIZANIDINE HCL 4 MG PO TABS: 4.0000 mg | ORAL_TABLET | Freq: Four times a day (QID) | ORAL | 1 refills | Status: AC | PRN

## 2024-05-17 MED ORDER — IBUPROFEN 800 MG PO TABS: 800.0000 mg | ORAL_TABLET | Freq: Three times a day (TID) | ORAL | 1 refills | Status: AC | PRN
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
## 2024-05-17 NOTE — Patient Instructions (Addendum)
 Start Physical therapy   If you need a work note our front desk person will write one   We are referring you to [facility] from [facility] address is [address] [place] The phone number is [phone]  The office will call you with an appointment Dr.  Georgina is the spine specialist

## 2024-05-18 ENCOUNTER — Ambulatory Visit: Admitting: Adult Health

## 2024-05-18 ENCOUNTER — Encounter: Payer: Self-pay | Admitting: Adult Health

## 2024-05-18 VITALS — BP 114/84 | HR 90 | Ht 62.0 in | Wt 192.0 lb

## 2024-05-18 DIAGNOSIS — B009 Herpesviral infection, unspecified: Secondary | ICD-10-CM | POA: Insufficient documentation

## 2024-05-18 DIAGNOSIS — R234 Changes in skin texture: Secondary | ICD-10-CM | POA: Insufficient documentation

## 2024-05-18 MED ORDER — DOXYCYCLINE HYCLATE 100 MG PO TABS: 100.0000 mg | ORAL_TABLET | Freq: Two times a day (BID) | ORAL | 0 refills | Status: DC

## 2024-05-18 NOTE — Progress Notes (Signed)
°  Subjective:     Patient ID: Charlotte Mccormick, female   DOB: 1985-09-07, 38 y.o.   MRN: [id number]  HPI Charlotte Mccormick is a 38 year old black female, married, G3P3003 back in follow up on fissure and vesicles, was HSV type 1, still taking Valtrex , fissure still there but not itching at much.     Component Value Date/Time   DIAGPAP (A) 04/06/2024 0846    - Atypical squamous cells of undetermined significance (ASC-US )   DIAGPAP  02/15/2019 0000    NEGATIVE FOR INTRAEPITHELIAL LESIONS OR MALIGNANCY.   HPVHIGH Negative 04/06/2024 0846   ADEQPAP  04/06/2024 0846    Satisfactory for evaluation; transformation zone component PRESENT.   ADEQPAP  02/15/2019 0000    Satisfactory for evaluation  endocervical/transformation zone component PRESENT.    PCP is  Rogue Petite NP Review of Systems Not itching as much, fissure still there Reviewed past medical,surgical, social and family history. Reviewed medications and allergies.     Objective:   Physical Exam BP 114/84 (BP Location: Left Arm, Patient Position: Sitting, Cuff Size: Normal)   Pulse 90   Ht 5' 2 (1.575 m)   Wt 192 lb (87.1 kg)   LMP 05/11/2024 (Exact Date)   BMI 35.12 kg/m     Skin is warm and dry. Still has 2 cm fissure near top of gluteal cleft, the area to the right is resolving that was vesicle. Examination chaperoned by Clarita Salt LPN  [facility] - 05/18/24 1509       Pregnancy Intention Screening   Does the patient want to become pregnant in the next year? No    Does the patient's partner want to become pregnant in the next year? No    Would the patient like to discuss contraceptive options today? No      Contraception Wrap Up   Current Method Vasectomy    End Method Vasectomy    Contraception Counseling Provided No          Assessment:     1. Fissure of skin (Primary) Still has 2 cm fissure, not itching now Continue silvadene , will rx doxycycline  100 mg 1 bid x 10 days Meds ordered this encounter   Medications   doxycycline  (VIBRA -TABS) 100 MG tablet    Sig: Take 1 tablet (100 mg total) by mouth 2 (two) times daily.    Dispense:  20 tablet    Refill:  0    Supervising Provider:   JAYNE MINDER H [[id number]]     2. Herpes simplex type 1 infection Resolving, finish valtrex     Plan:     Follow up with me in 2 weeks

## 2024-05-20 ENCOUNTER — Encounter: Payer: Self-pay | Admitting: Orthopedic Surgery

## 2024-05-20 ENCOUNTER — Telehealth: Payer: Self-pay | Admitting: Orthopedic Surgery

## 2024-05-20 NOTE — Telephone Encounter (Signed)
 IC patient. Advised her to dismiss the mychart message that I sent. There is documentation for intermittent and reduced in Dr. Areatha note. Advised that when I received forms that were faxed from [facility], there was a sticky note on a page and covers some questions. Patient will email or fax me her forms. I will complete upon receipt.

## 2024-05-25 LAB — HERPES SIMPLEX VIRUS CULTURE

## 2024-05-28 ENCOUNTER — Other Ambulatory Visit: Payer: Self-pay | Admitting: Internal Medicine

## 2024-05-31 ENCOUNTER — Encounter: Payer: Self-pay | Admitting: *Deleted

## 2024-05-31 NOTE — Telephone Encounter (Signed)
Pt needs appt before any further refills.

## 2024-06-01 ENCOUNTER — Encounter: Payer: Self-pay | Admitting: Adult Health

## 2024-06-01 ENCOUNTER — Ambulatory Visit: Admitting: Adult Health

## 2024-06-01 VITALS — BP 125/88 | HR 105 | Ht 62.0 in | Wt 192.0 lb

## 2024-06-01 DIAGNOSIS — R234 Changes in skin texture: Secondary | ICD-10-CM | POA: Diagnosis not present

## 2024-06-01 NOTE — Progress Notes (Signed)
" °  Subjective:     Patient ID: Charlotte Mccormick, female   DOB: 1985/09/05, 38 y.o.   MRN: [id number]  HPI Charlotte Mccormick is a 38 year old black female, married, G3P3003, back in follow up on taking doxycycline  for gluteal cleft fissure and it is better. Feels irritated near clitoris area.    Component Value Date/Time   DIAGPAP (A) 04/06/2024 0846    - Atypical squamous cells of undetermined significance (ASC-US )   DIAGPAP  02/15/2019 0000    NEGATIVE FOR INTRAEPITHELIAL LESIONS OR MALIGNANCY.   HPVHIGH Negative 04/06/2024 0846   ADEQPAP  04/06/2024 0846    Satisfactory for evaluation; transformation zone component PRESENT.   ADEQPAP  02/15/2019 0000    Satisfactory for evaluation  endocervical/transformation zone component PRESENT.    PCP is  Rogue Petite NP Review of Systems Fissure is better +irritated near clitoris area Reviewed past medical,surgical, social and family history. Reviewed medications and allergies.     Objective:   Physical Exam BP 125/88 (BP Location: Left Arm, Patient Position: Sitting, Cuff Size: Normal)   Pulse (!) 105   Ht 5' 2 (1.575 m)   Wt 192 lb (87.1 kg)   LMP 05/11/2024 (Exact Date)   BMI 35.12 kg/m     Skin is warm and dry. The fissure near top of gluteal cleft has resolved, no lesions on irritated noted near clitoral area. Examination chaperoned by Clarita Salt LPN  Fall risk is moderate  [facility] - 06/01/24 1409       Pregnancy Intention Screening   Does the patient want to become pregnant in the next year? No    Does the patient's partner want to become pregnant in the next year? No    Would the patient like to discuss contraceptive options today? No      Contraception Wrap Up   Current Method Vasectomy    End Method Vasectomy    Contraception Counseling Provided No          Assessment:     1. Fissure of skin (Primary) Has resolved     Plan:     Follow up prn     "

## 2024-06-08 ENCOUNTER — Telehealth: Payer: Self-pay | Admitting: *Deleted

## 2024-06-08 NOTE — Telephone Encounter (Signed)
 Patient called to advise that she is feeling as if her iron  is low with associated symptoms of severe fatigue.  Will bring in for CBC/Iron  Panel & Ferritin and consult with Delon Hope, NP regarding results and recommendations. Orders faxed to [facility] per patient's request.

## 2024-06-10 ENCOUNTER — Telehealth: Payer: Self-pay | Admitting: *Deleted

## 2024-06-10 NOTE — Telephone Encounter (Signed)
 Labs done to assess for IDA due to symptoms of extreme fatigue.  Reviewed by Delon Hope, NP and will schedule for 1 dose of Feraheme .  Patient aware and verbalized understanding.

## 2024-06-11 ENCOUNTER — Telehealth: Payer: Self-pay | Admitting: Orthopedic Surgery

## 2024-06-11 NOTE — Telephone Encounter (Signed)
 Received call from patient regarding her form completed. On 05/17/24 Dr. Margrette only mentioned intermittent or reduced hours and the form was completed with these instructions. Patient is requesting a continuous leave through 06/17/24. Please advised. Thank you!

## 2024-06-15 ENCOUNTER — Encounter: Payer: Self-pay | Admitting: Orthopedic Surgery

## 2024-06-15 ENCOUNTER — Inpatient Hospital Stay: Attending: Oncology

## 2024-06-15 VITALS — BP 118/87 | HR 80 | Temp 97.9°F | Resp 18

## 2024-06-15 DIAGNOSIS — D500 Iron deficiency anemia secondary to blood loss (chronic): Secondary | ICD-10-CM | POA: Diagnosis present

## 2024-06-15 MED ORDER — SODIUM CHLORIDE 0.9 % IV SOLN: Freq: Once | INTRAVENOUS | Status: AC

## 2024-06-15 MED ORDER — METHYLPREDNISOLONE SODIUM SUCC 125 MG IJ SOLR
125.0000 mg | Freq: Once | INTRAMUSCULAR | Status: AC
  Administered 2024-06-15: 125 mg via INTRAVENOUS
  Filled 2024-06-15: qty 2

## 2024-06-15 MED ORDER — FAMOTIDINE IN NACL 20-0.9 MG/50ML-% IV SOLN
20.0000 mg | Freq: Once | INTRAVENOUS | Status: AC
  Administered 2024-06-15: 20 mg via INTRAVENOUS
  Filled 2024-06-15: qty 50

## 2024-06-15 MED ORDER — SODIUM CHLORIDE 0.9 % IV SOLN
510.0000 mg | Freq: Once | INTRAVENOUS | Status: AC
  Administered 2024-06-15: 510 mg via INTRAVENOUS
  Filled 2024-06-15: qty 510

## 2024-06-15 MED ORDER — LORATADINE 10 MG PO TABS: 10.0000 mg | ORAL_TABLET | Freq: Once | ORAL | Status: DC

## 2024-06-15 MED ORDER — CETIRIZINE HCL 10 MG PO TABS
10.0000 mg | ORAL_TABLET | Freq: Once | ORAL | Status: AC
  Administered 2024-06-15: 10 mg via ORAL
  Filled 2024-06-15: qty 1

## 2024-06-15 MED ORDER — ACETAMINOPHEN 325 MG PO TABS
650.0000 mg | ORAL_TABLET | Freq: Once | ORAL | Status: AC
  Administered 2024-06-15: 650 mg via ORAL
  Filled 2024-06-15: qty 2

## 2024-06-15 NOTE — Patient Instructions (Signed)
 [facility] - A DEPT OF [facility]  Discharge Instructions: Thank you for choosing [facility] to provide your oncology and hematology care.  If you have a lab appointment with the Cancer Center - please note that after April 8th, 2024, all labs will be drawn in the cancer center.  You do not have to check in or register with the main entrance as you have in the past but will complete your check-in in the cancer center.  Wear comfortable clothing and clothing appropriate for easy access to any Portacath or PICC line.   We strive to give you quality time with your provider. You may need to reschedule your appointment if you arrive late (15 or more minutes).  Arriving late affects you and other patients whose appointments are after yours.  Also, if you miss three or more appointments without notifying the office, you may be dismissed from the clinic at the providers discretion.      For prescription refill requests, have your pharmacy contact our office and allow 72 hours for refills to be completed.    Today you received the following iron  infusion of Feraheme .  Ferumoxytol  Injection What is this medication? FERUMOXYTOL  (FER ue MOX i tol) treats low levels of iron  in your body (iron  deficiency anemia). Iron  is a mineral that plays an important role in making red blood cells, which carry oxygen from your lungs to the rest of your body. This medicine may be used for other purposes; ask your health care provider or pharmacist if you have questions. COMMON BRAND NAME(S): Feraheme  What should I tell my care team before I take this medication? They need to know if you have any of these conditions: Anemia not caused by low iron  levels High levels of iron  in the blood Magnetic resonance imaging (MRI) test scheduled An unusual or allergic reaction to iron , other medications, foods, dyes, or preservatives Pregnant or trying to get  pregnant Breastfeeding How should I use this medication? This medication is injected into a vein. It is given by your care team in a hospital or clinic setting. Talk to your care team the use of this medication in children. Special care may be needed. Overdosage: If you think you have taken too much of this medicine contact a poison control center or emergency room at once. NOTE: This medicine is only for you. Do not share this medicine with others. What if I miss a dose? It is important not to miss your dose. Call your care team if you are unable to keep an appointment. What may interact with this medication? Other iron  products This list may not describe all possible interactions. Give your health care provider a list of all the medicines, herbs, non-prescription drugs, or dietary supplements you use. Also tell them if you smoke, drink alcohol, or use illegal drugs. Some items may interact with your medicine. What should I watch for while using this medication? Visit your care team for regular checks on your progress. Tell your care team if your symptoms do not start to get better or if they get worse. You may need blood work done while you are taking this medication. You may need to eat more foods that contain iron . Talk to your care team. Foods that contain iron  include whole grains or cereals, dried fruits, beans, peas, leafy green vegetables, and organ meats (liver, kidney). What side effects may I notice from receiving this medication? Side effects that  you should report to your care team as soon as possible: Allergic reactions--skin rash, itching, hives, swelling of the face, lips, tongue, or throat Low blood pressure--dizziness, feeling faint or lightheaded, blurry vision Shortness of breath Side effects that usually do not require medical attention (report to your care team if they continue or are bothersome): Flushing Headache Joint pain Muscle pain Nausea Pain, redness, or  irritation at injection site This list may not describe all possible side effects. Call your doctor for medical advice about side effects. You may report side effects to FDA at 1-800-FDA-1088. Where should I keep my medication? This medication is given in a hospital or clinic. It will not be stored at home. NOTE: This sheet is a summary. It may not cover all possible information. If you have questions about this medicine, talk to your doctor, pharmacist, or health care provider.  2024 Elsevier/Gold Standard (2023-01-08 00:00:00)   To help prevent nausea and vomiting after your treatment, we encourage you to take your nausea medication as directed.  BELOW ARE SYMPTOMS THAT SHOULD BE REPORTED IMMEDIATELY: *FEVER GREATER THAN 100.4 F (38 C) OR HIGHER *CHILLS OR SWEATING *NAUSEA AND VOMITING THAT IS NOT CONTROLLED WITH YOUR NAUSEA MEDICATION *UNUSUAL SHORTNESS OF BREATH *UNUSUAL BRUISING OR BLEEDING *URINARY PROBLEMS (pain or burning when urinating, or frequent urination) *BOWEL PROBLEMS (unusual diarrhea, constipation, pain near the anus) TENDERNESS IN MOUTH AND THROAT WITH OR WITHOUT PRESENCE OF ULCERS (sore throat, sores in mouth, or a toothache) UNUSUAL RASH, SWELLING OR PAIN  UNUSUAL VAGINAL DISCHARGE OR ITCHING   Items with * indicate a potential emergency and should be followed up as soon as possible or go to the Emergency Department if any problems should occur.  Please show the CHEMOTHERAPY ALERT CARD or IMMUNOTHERAPY ALERT CARD at check-in to the Emergency Department and triage nurse.  Should you have questions after your visit or need to cancel or reschedule your appointment, please contact [facility] [place] - A DEPT OF [facility] [phone]  and follow the prompts.  Office hours are 8:00 a.m. to 4:30 p.m. Monday - Friday. Please note that voicemails left after 4:00 p.m. may not be returned until the following business day.  We are closed weekends and  major holidays. You have access to a nurse at all times for urgent questions. Please call the main number to the clinic [phone] and follow the prompts.  For any non-urgent questions, you may also contact your provider using MyChart. We now offer e-Visits for anyone 28 and older to request care online for non-urgent symptoms. For details visit [email or web address].   Also download the MyChart app! Go to the app store, search MyChart, open the app, select [place], and log in with your MyChart username and password.

## 2024-06-15 NOTE — Progress Notes (Signed)
 Patient presents today for iron  of Ferahme infusion.  Patient is in satisfactory condition with no new complaints voiced.  Vital signs are stable.  We will proceed with infusion per provider orders.    Patient tolerated treatment well with no complaints voiced.  Patient left ambulatory in stable condition.  Vital signs stable at discharge.  Follow up as scheduled.

## 2024-06-22 ENCOUNTER — Encounter (HOSPITAL_COMMUNITY): Payer: Self-pay

## 2024-06-22 ENCOUNTER — Ambulatory Visit (HOSPITAL_COMMUNITY): Attending: Orthopedic Surgery

## 2024-06-22 DIAGNOSIS — M545 Low back pain, unspecified: Secondary | ICD-10-CM | POA: Diagnosis present

## 2024-06-22 DIAGNOSIS — G8929 Other chronic pain: Secondary | ICD-10-CM | POA: Diagnosis present

## 2024-06-22 DIAGNOSIS — R262 Difficulty in walking, not elsewhere classified: Secondary | ICD-10-CM | POA: Diagnosis present

## 2024-06-22 DIAGNOSIS — M2560 Stiffness of unspecified joint, not elsewhere classified: Secondary | ICD-10-CM | POA: Insufficient documentation
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
## 2024-06-25 ENCOUNTER — Encounter (HOSPITAL_COMMUNITY): Payer: Self-pay

## 2024-06-25 ENCOUNTER — Ambulatory Visit (HOSPITAL_COMMUNITY)

## 2024-06-25 DIAGNOSIS — R262 Difficulty in walking, not elsewhere classified: Secondary | ICD-10-CM

## 2024-06-25 DIAGNOSIS — G8929 Other chronic pain: Secondary | ICD-10-CM

## 2024-06-25 DIAGNOSIS — M2560 Stiffness of unspecified joint, not elsewhere classified: Secondary | ICD-10-CM

## 2024-06-25 DIAGNOSIS — M545 Low back pain, unspecified: Secondary | ICD-10-CM | POA: Diagnosis not present
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
## 2024-06-28 ENCOUNTER — Ambulatory Visit (HOSPITAL_COMMUNITY)

## 2024-06-30 ENCOUNTER — Encounter: Admitting: Orthopedic Surgery

## 2024-07-01 ENCOUNTER — Ambulatory Visit (HOSPITAL_COMMUNITY)

## 2024-07-01 ENCOUNTER — Encounter (HOSPITAL_COMMUNITY): Payer: Self-pay

## 2024-07-01 DIAGNOSIS — M545 Low back pain, unspecified: Secondary | ICD-10-CM | POA: Diagnosis not present

## 2024-07-01 DIAGNOSIS — R262 Difficulty in walking, not elsewhere classified: Secondary | ICD-10-CM

## 2024-07-01 DIAGNOSIS — M2560 Stiffness of unspecified joint, not elsewhere classified: Secondary | ICD-10-CM
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
## 2024-07-05 ENCOUNTER — Ambulatory Visit (HOSPITAL_COMMUNITY)

## 2024-07-08 ENCOUNTER — Ambulatory Visit (HOSPITAL_COMMUNITY)

## 2024-07-12 ENCOUNTER — Ambulatory Visit (HOSPITAL_COMMUNITY)

## 2024-07-15 ENCOUNTER — Ambulatory Visit (HOSPITAL_COMMUNITY)

## 2024-07-19 ENCOUNTER — Ambulatory Visit (HOSPITAL_COMMUNITY)

## 2024-07-22 ENCOUNTER — Ambulatory Visit (HOSPITAL_COMMUNITY)

## 2024-07-27 ENCOUNTER — Ambulatory Visit (HOSPITAL_COMMUNITY)

## 2024-07-29 ENCOUNTER — Ambulatory Visit (HOSPITAL_COMMUNITY)

## 2024-08-02 ENCOUNTER — Ambulatory Visit: Admitting: Gastroenterology

## 2024-08-04 ENCOUNTER — Ambulatory Visit (HOSPITAL_COMMUNITY)

## 2024-08-04 ENCOUNTER — Inpatient Hospital Stay: Attending: Oncology

## 2024-08-06 ENCOUNTER — Ambulatory Visit (HOSPITAL_COMMUNITY)

## 2024-08-11 ENCOUNTER — Encounter: Admitting: Orthopedic Surgery

## 2024-08-11 ENCOUNTER — Inpatient Hospital Stay: Admitting: Oncology

## 2024-08-11 ENCOUNTER — Ambulatory Visit: Admitting: Oncology
